# Patient Record
Sex: Female | Born: 1970 | Race: Black or African American | Hispanic: No | Marital: Single | Smoking: Never smoker
Health system: Southern US, Community
[De-identification: ages and names within clinical notes are randomized; demographics above are authoritative.]

## PROBLEM LIST (undated history)

## (undated) ENCOUNTER — Ambulatory Visit (HOSPITAL_COMMUNITY): Admission: EM | Payer: Self-pay | Source: Home / Self Care

## (undated) DIAGNOSIS — I38 Endocarditis, valve unspecified: Secondary | ICD-10-CM

## (undated) DIAGNOSIS — R51 Headache: Secondary | ICD-10-CM

## (undated) DIAGNOSIS — K279 Peptic ulcer, site unspecified, unspecified as acute or chronic, without hemorrhage or perforation: Secondary | ICD-10-CM

## (undated) DIAGNOSIS — R112 Nausea with vomiting, unspecified: Secondary | ICD-10-CM

## (undated) DIAGNOSIS — E1165 Type 2 diabetes mellitus with hyperglycemia: Secondary | ICD-10-CM

## (undated) DIAGNOSIS — Z8719 Personal history of other diseases of the digestive system: Secondary | ICD-10-CM

## (undated) DIAGNOSIS — G5603 Carpal tunnel syndrome, bilateral upper limbs: Secondary | ICD-10-CM

## (undated) DIAGNOSIS — G43909 Migraine, unspecified, not intractable, without status migrainosus: Secondary | ICD-10-CM

## (undated) DIAGNOSIS — I10 Essential (primary) hypertension: Secondary | ICD-10-CM

## (undated) DIAGNOSIS — G43019 Migraine without aura, intractable, without status migrainosus: Secondary | ICD-10-CM

## (undated) DIAGNOSIS — N289 Disorder of kidney and ureter, unspecified: Secondary | ICD-10-CM

## (undated) DIAGNOSIS — K219 Gastro-esophageal reflux disease without esophagitis: Secondary | ICD-10-CM

## (undated) DIAGNOSIS — B9681 Helicobacter pylori [H. pylori] as the cause of diseases classified elsewhere: Secondary | ICD-10-CM

## (undated) DIAGNOSIS — J45909 Unspecified asthma, uncomplicated: Secondary | ICD-10-CM

## (undated) DIAGNOSIS — R519 Headache, unspecified: Secondary | ICD-10-CM

## (undated) DIAGNOSIS — Z9889 Other specified postprocedural states: Secondary | ICD-10-CM

## (undated) HISTORY — PX: DILATION AND CURETTAGE OF UTERUS: SHX78

## (undated) HISTORY — DX: Migraine without aura, intractable, without status migrainosus: G43.019

## (undated) HISTORY — PX: TUBAL LIGATION: SHX77

---
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
## 1997-10-31 ENCOUNTER — Emergency Department (HOSPITAL_COMMUNITY): Admission: EM | Admit: 1997-10-31 | Discharge: 1997-10-31 | Payer: Self-pay | Admitting: Emergency Medicine

## 1998-06-28 ENCOUNTER — Emergency Department (HOSPITAL_COMMUNITY): Admission: EM | Admit: 1998-06-28 | Discharge: 1998-06-28 | Payer: Self-pay | Admitting: Emergency Medicine

## 1998-06-29 ENCOUNTER — Emergency Department (HOSPITAL_COMMUNITY): Admission: EM | Admit: 1998-06-29 | Discharge: 1998-06-29 | Payer: Self-pay | Admitting: Emergency Medicine

## 1999-01-13 ENCOUNTER — Ambulatory Visit (HOSPITAL_COMMUNITY): Admission: RE | Admit: 1999-01-13 | Discharge: 1999-01-13 | Payer: Self-pay | Admitting: Internal Medicine

## 1999-01-13 ENCOUNTER — Encounter: Payer: Self-pay | Admitting: Internal Medicine

## 1999-09-28 ENCOUNTER — Encounter (INDEPENDENT_AMBULATORY_CARE_PROVIDER_SITE_OTHER): Payer: Self-pay

## 1999-09-28 ENCOUNTER — Ambulatory Visit (HOSPITAL_COMMUNITY): Admission: RE | Admit: 1999-09-28 | Discharge: 1999-09-28 | Payer: Self-pay | Admitting: Obstetrics

## 2000-06-22 ENCOUNTER — Inpatient Hospital Stay (HOSPITAL_COMMUNITY): Admission: AD | Admit: 2000-06-22 | Discharge: 2000-06-22 | Payer: Self-pay | Admitting: Obstetrics & Gynecology

## 2000-11-18 ENCOUNTER — Inpatient Hospital Stay (HOSPITAL_COMMUNITY): Admission: AD | Admit: 2000-11-18 | Discharge: 2000-11-18 | Payer: Self-pay | Admitting: Obstetrics

## 2001-01-27 ENCOUNTER — Emergency Department (HOSPITAL_COMMUNITY): Admission: EM | Admit: 2001-01-27 | Discharge: 2001-01-27 | Payer: Self-pay | Admitting: Emergency Medicine

## 2001-05-26 ENCOUNTER — Inpatient Hospital Stay (HOSPITAL_COMMUNITY): Admission: AD | Admit: 2001-05-26 | Discharge: 2001-05-26 | Payer: Self-pay | Admitting: *Deleted

## 2001-09-21 ENCOUNTER — Inpatient Hospital Stay (HOSPITAL_COMMUNITY): Admission: AD | Admit: 2001-09-21 | Discharge: 2001-09-21 | Payer: Self-pay | Admitting: *Deleted

## 2001-10-30 ENCOUNTER — Inpatient Hospital Stay (HOSPITAL_COMMUNITY): Admission: AD | Admit: 2001-10-30 | Discharge: 2001-10-30 | Payer: Self-pay | Admitting: *Deleted

## 2002-02-04 ENCOUNTER — Inpatient Hospital Stay (HOSPITAL_COMMUNITY): Admission: AD | Admit: 2002-02-04 | Discharge: 2002-02-04 | Payer: Self-pay | Admitting: *Deleted

## 2002-03-24 ENCOUNTER — Encounter: Admission: RE | Admit: 2002-03-24 | Discharge: 2002-03-24 | Payer: Self-pay | Admitting: Obstetrics and Gynecology

## 2002-03-24 ENCOUNTER — Encounter: Payer: Self-pay | Admitting: Obstetrics and Gynecology

## 2002-04-15 ENCOUNTER — Inpatient Hospital Stay (HOSPITAL_COMMUNITY): Admission: AD | Admit: 2002-04-15 | Discharge: 2002-04-15 | Payer: Self-pay | Admitting: *Deleted

## 2002-09-12 ENCOUNTER — Inpatient Hospital Stay (HOSPITAL_COMMUNITY): Admission: AD | Admit: 2002-09-12 | Discharge: 2002-09-12 | Payer: Self-pay | Admitting: Obstetrics and Gynecology

## 2002-12-25 ENCOUNTER — Emergency Department (HOSPITAL_COMMUNITY): Admission: EM | Admit: 2002-12-25 | Discharge: 2002-12-26 | Payer: Self-pay | Admitting: Emergency Medicine

## 2003-08-26 ENCOUNTER — Inpatient Hospital Stay (HOSPITAL_COMMUNITY): Admission: AD | Admit: 2003-08-26 | Discharge: 2003-08-26 | Payer: Self-pay | Admitting: Obstetrics and Gynecology

## 2004-01-23 ENCOUNTER — Inpatient Hospital Stay (HOSPITAL_COMMUNITY): Admission: AD | Admit: 2004-01-23 | Discharge: 2004-01-23 | Payer: Self-pay | Admitting: Obstetrics and Gynecology

## 2004-05-28 ENCOUNTER — Emergency Department (HOSPITAL_COMMUNITY): Admission: EM | Admit: 2004-05-28 | Discharge: 2004-05-29 | Payer: Self-pay | Admitting: Emergency Medicine

## 2004-07-28 ENCOUNTER — Inpatient Hospital Stay (HOSPITAL_COMMUNITY): Admission: AD | Admit: 2004-07-28 | Discharge: 2004-07-28 | Payer: Self-pay | Admitting: Obstetrics & Gynecology

## 2004-10-21 ENCOUNTER — Ambulatory Visit (HOSPITAL_COMMUNITY): Admission: RE | Admit: 2004-10-21 | Discharge: 2004-10-21 | Payer: Self-pay | Admitting: Allergy and Immunology

## 2005-01-12 ENCOUNTER — Inpatient Hospital Stay (HOSPITAL_COMMUNITY): Admission: AD | Admit: 2005-01-12 | Discharge: 2005-01-12 | Payer: Self-pay | Admitting: Obstetrics and Gynecology

## 2005-01-29 ENCOUNTER — Inpatient Hospital Stay (HOSPITAL_COMMUNITY): Admission: AD | Admit: 2005-01-29 | Discharge: 2005-01-29 | Payer: Self-pay | Admitting: *Deleted

## 2005-05-27 ENCOUNTER — Inpatient Hospital Stay (HOSPITAL_COMMUNITY): Admission: AD | Admit: 2005-05-27 | Discharge: 2005-05-27 | Payer: Self-pay | Admitting: Obstetrics and Gynecology

## 2006-02-24 ENCOUNTER — Inpatient Hospital Stay (HOSPITAL_COMMUNITY): Admission: AD | Admit: 2006-02-24 | Discharge: 2006-02-25 | Payer: Self-pay | Admitting: Obstetrics and Gynecology

## 2006-03-19 ENCOUNTER — Inpatient Hospital Stay (HOSPITAL_COMMUNITY): Admission: AD | Admit: 2006-03-19 | Discharge: 2006-03-19 | Payer: Self-pay | Admitting: Gynecology

## 2006-12-14 ENCOUNTER — Emergency Department (HOSPITAL_COMMUNITY): Admission: EM | Admit: 2006-12-14 | Discharge: 2006-12-14 | Payer: Self-pay | Admitting: Emergency Medicine

## 2007-01-17 ENCOUNTER — Emergency Department (HOSPITAL_COMMUNITY): Admission: EM | Admit: 2007-01-17 | Discharge: 2007-01-17 | Payer: Self-pay | Admitting: Emergency Medicine

## 2007-08-02 ENCOUNTER — Emergency Department (HOSPITAL_COMMUNITY): Admission: EM | Admit: 2007-08-02 | Discharge: 2007-08-02 | Payer: Self-pay | Admitting: Family Medicine

## 2007-08-27 ENCOUNTER — Emergency Department (HOSPITAL_COMMUNITY): Admission: EM | Admit: 2007-08-27 | Discharge: 2007-08-27 | Payer: Self-pay | Admitting: Emergency Medicine

## 2009-06-17 ENCOUNTER — Ambulatory Visit: Payer: Self-pay | Admitting: Physician Assistant

## 2009-06-17 ENCOUNTER — Inpatient Hospital Stay (HOSPITAL_COMMUNITY): Admission: AD | Admit: 2009-06-17 | Discharge: 2009-06-17 | Payer: Self-pay | Admitting: Obstetrics & Gynecology

## 2010-02-01 ENCOUNTER — Inpatient Hospital Stay (HOSPITAL_COMMUNITY): Admission: AD | Admit: 2010-02-01 | Discharge: 2010-02-01 | Payer: Self-pay | Admitting: Obstetrics and Gynecology

## 2010-02-01 ENCOUNTER — Ambulatory Visit: Payer: Self-pay | Admitting: Nurse Practitioner

## 2010-05-31 ENCOUNTER — Inpatient Hospital Stay (HOSPITAL_COMMUNITY): Admission: AD | Admit: 2010-05-31 | Discharge: 2010-05-31 | Payer: Self-pay | Admitting: Family Medicine

## 2010-05-31 ENCOUNTER — Ambulatory Visit: Payer: Self-pay | Admitting: Physician Assistant

## 2010-07-07 ENCOUNTER — Ambulatory Visit: Payer: Self-pay | Admitting: Obstetrics and Gynecology

## 2010-08-21 ENCOUNTER — Encounter: Payer: Self-pay | Admitting: Gastroenterology

## 2010-10-12 LAB — WET PREP, GENITAL
Clue Cells Wet Prep HPF POC: NONE SEEN
Trich, Wet Prep: NONE SEEN
Yeast Wet Prep HPF POC: NONE SEEN

## 2010-10-12 LAB — CBC
HCT: 37 % (ref 36.0–46.0)
Hemoglobin: 12.7 g/dL (ref 12.0–15.0)
MCH: 32.2 pg (ref 26.0–34.0)
MCHC: 34.3 g/dL (ref 30.0–36.0)
MCV: 93.9 fL (ref 78.0–100.0)
Platelets: 267 10*3/uL (ref 150–400)
RBC: 3.93 MIL/uL (ref 3.87–5.11)
RDW: 13.3 % (ref 11.5–15.5)
WBC: 6.3 10*3/uL (ref 4.0–10.5)

## 2010-10-12 LAB — DIFFERENTIAL
Basophils Absolute: 0.1 10*3/uL (ref 0.0–0.1)
Basophils Relative: 1 % (ref 0–1)
Eosinophils Absolute: 0.1 10*3/uL (ref 0.0–0.7)
Eosinophils Relative: 2 % (ref 0–5)
Lymphocytes Relative: 34 % (ref 12–46)
Lymphs Abs: 2.1 10*3/uL (ref 0.7–4.0)
Monocytes Absolute: 0.5 10*3/uL (ref 0.1–1.0)
Monocytes Relative: 8 % (ref 3–12)
Neutro Abs: 3.4 10*3/uL (ref 1.7–7.7)
Neutrophils Relative %: 55 % (ref 43–77)

## 2010-10-12 LAB — URINALYSIS, ROUTINE W REFLEX MICROSCOPIC
Bilirubin Urine: NEGATIVE
Glucose, UA: NEGATIVE mg/dL
Ketones, ur: NEGATIVE mg/dL
Leukocytes, UA: NEGATIVE
Nitrite: NEGATIVE
Protein, ur: NEGATIVE mg/dL
Specific Gravity, Urine: 1.025 (ref 1.005–1.030)
Urobilinogen, UA: 0.2 mg/dL (ref 0.0–1.0)
pH: 6 (ref 5.0–8.0)

## 2010-10-12 LAB — GC/CHLAMYDIA PROBE AMP, GENITAL
Chlamydia, DNA Probe: NEGATIVE
GC Probe Amp, Genital: NEGATIVE

## 2010-10-12 LAB — POCT PREGNANCY, URINE: Preg Test, Ur: NEGATIVE

## 2010-10-12 LAB — URINE MICROSCOPIC-ADD ON

## 2010-10-16 LAB — URINALYSIS, ROUTINE W REFLEX MICROSCOPIC
Bilirubin Urine: NEGATIVE
Glucose, UA: NEGATIVE mg/dL
Ketones, ur: NEGATIVE mg/dL
Leukocytes, UA: NEGATIVE
Nitrite: NEGATIVE
Protein, ur: NEGATIVE mg/dL
Specific Gravity, Urine: 1.025 (ref 1.005–1.030)
Urobilinogen, UA: 0.2 mg/dL (ref 0.0–1.0)
pH: 7 (ref 5.0–8.0)

## 2010-10-16 LAB — POCT PREGNANCY, URINE: Preg Test, Ur: NEGATIVE

## 2010-10-16 LAB — URINE MICROSCOPIC-ADD ON

## 2010-10-16 LAB — GC/CHLAMYDIA PROBE AMP, GENITAL
Chlamydia, DNA Probe: POSITIVE — AB
GC Probe Amp, Genital: NEGATIVE

## 2010-10-16 LAB — WET PREP, GENITAL
Trich, Wet Prep: NONE SEEN
Yeast Wet Prep HPF POC: NONE SEEN

## 2010-11-02 LAB — URINALYSIS, ROUTINE W REFLEX MICROSCOPIC
Bilirubin Urine: NEGATIVE
Glucose, UA: NEGATIVE mg/dL
Ketones, ur: NEGATIVE mg/dL
Leukocytes, UA: NEGATIVE
Nitrite: NEGATIVE
Protein, ur: NEGATIVE mg/dL
Specific Gravity, Urine: 1.025 (ref 1.005–1.030)
Urobilinogen, UA: 0.2 mg/dL (ref 0.0–1.0)
pH: 6.5 (ref 5.0–8.0)

## 2010-11-02 LAB — URINE MICROSCOPIC-ADD ON

## 2010-11-02 LAB — WET PREP, GENITAL
Trich, Wet Prep: NONE SEEN
Yeast Wet Prep HPF POC: NONE SEEN

## 2010-11-02 LAB — POCT PREGNANCY, URINE: Preg Test, Ur: NEGATIVE

## 2010-11-02 LAB — GC/CHLAMYDIA PROBE AMP, GENITAL
Chlamydia, DNA Probe: NEGATIVE
GC Probe Amp, Genital: NEGATIVE

## 2010-12-16 NOTE — Op Note (Signed)
[facility]  Patient:    Sandra Moody, Sandra Moody                    MRN: [id number] Proc. Date: 09/28/99 Adm. Date:  [id number] Attending:  Venita Sheffield                           Operative Report  PREOPERATIVE DIAGNOSIS:       Dysfunctional uterine bleeding and multiparity.  POSTOPERATIVE DIAGNOSIS:  OPERATION:                    Diagnostic D&C and open laparoscopic tubal sterilization.  SURGEON:                      Kathreen Cosier, M.D.  ASSISTANT:  ANESTHESIA:                   General anesthesia.  ESTIMATED BLOOD LOSS:  DESCRIPTION OF PROCEDURE:     Under general anesthesia with the patient in the lithotomy position, the abdomen, perineum, and vagina were prepped and draped. The bladder was emptied with a straight catheter.  The uterus was normal size with negative adnexa.  A weighted speculum was placed in the vagina.  Anterior lip of the cervix grasped with a tenaculum.  The endocervix curetted and a small amount of tissue obtained.  Endometrial cavity sounded to 10 cm.  The cervix dilated with a #10 Pratt and the endometrial cavity curetted.  A small amount of tissue obtained. A Hulka tenaculum placed in the endocervix.  In the umbilicus, a transverse incision was made and carried down to the fascia.  The fascia was cleaned and grasped with two Kochers.  The fascia and peritoneum opened with the Mayo scissors. The sleeve of the trocar inserted intraperitoneally.  3 liters of carbon dioxide infused intraperitoneally.  Visualizing scope inserted.  The uterus, tubes, and  ovaries were normal.  The cautery probe was inserted through the sleeve of the scope.  The tube was traced to the fimbria on the right and also on the left. n the right side, starting one inch from the cornua the tube was cauterized.  It as cauterized in a total of five places moving lateral from the first site of cautery. Approximately two inches of  tube cauterized.  The procedure done in a similar fashion on the other side.  The probe was removed.  CO2 allowed to escape from he peritoneal cavity.  The fascia was closed with one stitch of 0 Dexon.  The skin  closed with subcuticular stitch of 3-0 plain.  The patient tolerated the procedure well and was taken to the recovery room in good condition. DD:  09/28/99 TD:  09/28/99 Job: [id number]

## 2010-12-25 ENCOUNTER — Inpatient Hospital Stay (HOSPITAL_COMMUNITY)
Admission: AD | Admit: 2010-12-25 | Discharge: 2010-12-25 | Disposition: A | Discharge status: Home / Self Care | Payer: Self-pay | Source: Ambulatory Visit | Attending: Obstetrics & Gynecology | Admitting: Obstetrics & Gynecology

## 2010-12-25 DIAGNOSIS — N390 Urinary tract infection, site not specified: Secondary | ICD-10-CM

## 2010-12-25 DIAGNOSIS — R300 Dysuria: Secondary | ICD-10-CM

## 2010-12-25 LAB — URINALYSIS, ROUTINE W REFLEX MICROSCOPIC
Bilirubin Urine: NEGATIVE
Glucose, UA: NEGATIVE mg/dL
Ketones, ur: NEGATIVE mg/dL
Nitrite: POSITIVE — AB
Protein, ur: 100 mg/dL — AB
Specific Gravity, Urine: >1.03 — ABNORMAL HIGH (ref 1.005–1.030)
Urobilinogen, UA: 0.2 mg/dL (ref 0.0–1.0)
pH: 6.5 (ref 5.0–8.0)

## 2010-12-25 LAB — URINE MICROSCOPIC-ADD ON

## 2010-12-25 LAB — POCT PREGNANCY, URINE: Preg Test, Ur: NEGATIVE

## 2010-12-27 LAB — URINE CULTURE
Colony Count: >=100000
Culture  Setup Time: 201205272012

## 2011-04-20 LAB — POCT RAPID STREP A: Streptococcus, Group A Screen (Direct): NEGATIVE

## 2012-05-29 ENCOUNTER — Inpatient Hospital Stay (HOSPITAL_COMMUNITY)
Admission: AD | Admit: 2012-05-29 | Discharge: 2012-05-30 | Disposition: A | Discharge status: Home / Self Care | Payer: Self-pay | Source: Ambulatory Visit | Attending: Obstetrics and Gynecology | Admitting: Obstetrics and Gynecology

## 2012-05-29 ENCOUNTER — Encounter (HOSPITAL_COMMUNITY): Payer: Self-pay | Admitting: *Deleted

## 2012-05-29 DIAGNOSIS — R1032 Left lower quadrant pain: Secondary | ICD-10-CM | POA: Insufficient documentation

## 2012-05-29 DIAGNOSIS — N83209 Unspecified ovarian cyst, unspecified side: Secondary | ICD-10-CM | POA: Insufficient documentation

## 2012-05-29 HISTORY — DX: Unspecified asthma, uncomplicated: J45.909

## 2012-05-29 LAB — POCT PREGNANCY, URINE: Preg Test, Ur: NEGATIVE

## 2012-05-29 NOTE — MAU Note (Signed)
Pt states she started feeling pain bad about an hour ago. Pt states she has been feeling cramping pain for about 1 week. Pt started having pain in her back.

## 2012-05-29 NOTE — MAU Note (Signed)
Pt reports pain on left side , feels like a stabbing pain x 1 week, today lower back started hurting. Denies dysuria. LMP 04/25/2012

## 2012-05-30 ENCOUNTER — Inpatient Hospital Stay (HOSPITAL_COMMUNITY): Payer: Self-pay

## 2012-05-30 DIAGNOSIS — N83209 Unspecified ovarian cyst, unspecified side: Secondary | ICD-10-CM

## 2012-05-30 LAB — URINALYSIS, ROUTINE W REFLEX MICROSCOPIC
Bilirubin Urine: NEGATIVE
Glucose, UA: NEGATIVE mg/dL
Ketones, ur: NEGATIVE mg/dL
Leukocytes, UA: NEGATIVE
Nitrite: NEGATIVE
Protein, ur: NEGATIVE mg/dL
Specific Gravity, Urine: 1.025 (ref 1.005–1.030)
Urobilinogen, UA: 0.2 mg/dL (ref 0.0–1.0)
pH: 6 (ref 5.0–8.0)

## 2012-05-30 LAB — URINE MICROSCOPIC-ADD ON

## 2012-05-30 LAB — WET PREP, GENITAL
Trich, Wet Prep: NONE SEEN
Yeast Wet Prep HPF POC: NONE SEEN

## 2012-05-30 MED ORDER — KETOROLAC TROMETHAMINE 60 MG/2ML IM SOLN
60.0000 mg | Freq: Once | INTRAMUSCULAR | Status: AC
  Administered 2012-05-30: 60 mg via INTRAMUSCULAR
  Filled 2012-05-30: qty 2

## 2012-05-30 NOTE — MAU Provider Note (Signed)
History     CSN: [id number]  Arrival date and time: 05/29/12 2323   First Provider Initiated Contact with Patient 05/30/12 0015      Chief Complaint  Patient presents with  . Abdominal Pain   HPI  Sandra Moody is a 41 y.o. [id number] who presents today with LLQ pain and lower back pain. She states that the LLQ started about a week ago, and then the back pain started just before she came here. She rates the pain 6/10. She took 2 tylenol before coming here and states that the pain has improved, but only a very small amount.   Past Medical History  Diagnosis Date  . Asthma     Past Surgical History  Procedure Date  . Tubal ligation     Family History  Problem Relation Age of Onset  . Thyroid disease Mother   . Heart disease Mother     History  Substance Use Topics  . Smoking status: Never Smoker   . Smokeless tobacco: Never Used  . Alcohol Use: Yes    Allergies: Allergies no known allergies  Prescriptions prior to admission  Medication Sig Dispense Refill  . Multiple Vitamin (MULTIVITAMIN) capsule Take 1 capsule by mouth daily.        Review of Systems  Constitutional: Negative for fever and chills.  Gastrointestinal: Positive for abdominal pain (see HPI). Negative for nausea, vomiting, diarrhea and constipation.  Genitourinary: Negative for dysuria, urgency and frequency.  Musculoskeletal: Positive for back pain (see HPI). Negative for myalgias.   Physical Exam   Blood pressure 143/91, pulse 80, temperature 98.1 F (36.7 C), temperature source Oral, resp. rate 18, height 5' 3.5" (1.613 m), weight 64.864 kg (143 lb), last menstrual period 04/25/2012, SpO2 100.00%.  Physical Exam  Nursing note and vitals reviewed. Constitutional: She is oriented to person, place, and time. She appears well-developed and well-nourished.  Cardiovascular: Normal rate and regular rhythm.   Respiratory: Effort normal.  GI: Soft. She exhibits no distension. There is no  tenderness. There is no rebound and no guarding.  Genitourinary:       No CVA tenderness  External: normal Vagina: small amount of patchy white discharge Cervix: pink, smooth, no CMT Uterus: AV, NSSC Adnexa: Right: NT Left: tender, slightly enlarged  Neurological: She is alert and oriented to person, place, and time.  Skin: Skin is warm and dry.    MAU Course  Procedures  Limited pelvic ultrasound ordered   *RADIOLOGY REPORT*  Clinical Data: Left pelvic pain for 1 week, worsening.  TRANSABDOMINAL AND TRANSVAGINAL ULTRASOUND OF PELVIS  Technique: Both transabdominal and transvaginal ultrasound  examinations of the pelvis were performed. Transabdominal technique  was performed for global imaging of the pelvis including uterus,  ovaries, adnexal regions, and pelvic cul-de-sac.  It was necessary to proceed with endovaginal exam following the  transabdominal exam to visualize the right ovary and adnexa.  Comparison: Pelvic ultrasound 05/31/2010.  Findings:  Uterus: Measures 10.1 x 4.7 x 7.4 cm. A single 1.8 cm in diameter  fibroid is identified.  Endometrium: Normal in thickness and appearance  Right ovary: Normal appearance/no adnexal mass  Left ovary: Measures 5.8 x 3.8 x 3.9 cm. A cyst is identified  measuring 5.0 by 3.2 by 3.2 cm. Thin septations are identified  within the cyst and there some lacy internal echoes. No focal  nodule is identified. There is no flow within the septations on  Doppler imaging.  Other findings: No free fluid  IMPRESSION:  Septated left ovarian cysts as described above. Repeat ultrasound  in 4-6 weeks is recommended to exclude cystic neoplasm.  Original Report Authenticated By: Bernadene Bell. Maricela Curet, M.D.   Assessment and Plan   1. Other and unspecified ovarian cyst   FU in GYN clinic in 4 weeks.   Tawnya Crook 05/30/2012, 12:19 AM

## 2012-05-30 NOTE — Progress Notes (Signed)
Some blood noted during collection of specimens.

## 2012-05-31 LAB — GC/CHLAMYDIA PROBE AMP, GENITAL
Chlamydia, DNA Probe: NEGATIVE
GC Probe Amp, Genital: NEGATIVE

## 2012-06-03 NOTE — MAU Provider Note (Signed)
Attestation of Attending Supervision of Advanced Practitioner: Evaluation and management procedures were performed by the PA/NP/CNM/OB Fellow under my supervision/collaboration. Chart reviewed and agree with management and plan.  Amee Boothe V 06/03/2012 12:17 AM    

## 2012-06-17 ENCOUNTER — Encounter: Payer: Self-pay | Admitting: Obstetrics & Gynecology

## 2012-06-17 ENCOUNTER — Ambulatory Visit (INDEPENDENT_AMBULATORY_CARE_PROVIDER_SITE_OTHER): Payer: Self-pay | Admitting: Obstetrics & Gynecology

## 2012-06-17 VITALS — BP 134/88 | HR 93 | Temp 97.5°F | Ht 63.5 in | Wt 141.7 lb

## 2012-06-17 DIAGNOSIS — R102 Pelvic and perineal pain unspecified side: Secondary | ICD-10-CM | POA: Insufficient documentation

## 2012-06-17 DIAGNOSIS — B9689 Other specified bacterial agents as the cause of diseases classified elsewhere: Secondary | ICD-10-CM | POA: Insufficient documentation

## 2012-06-17 DIAGNOSIS — N949 Unspecified condition associated with female genital organs and menstrual cycle: Secondary | ICD-10-CM

## 2012-06-17 DIAGNOSIS — N83209 Unspecified ovarian cyst, unspecified side: Secondary | ICD-10-CM

## 2012-06-17 DIAGNOSIS — A499 Bacterial infection, unspecified: Secondary | ICD-10-CM

## 2012-06-17 DIAGNOSIS — N760 Acute vaginitis: Secondary | ICD-10-CM

## 2012-06-17 MED ORDER — METRONIDAZOLE 0.75 % VA GEL: 1.0000 | Freq: Every day | VAGINAL | Status: DC

## 2012-06-17 NOTE — Progress Notes (Signed)
Patient ID: Sandra Moody, female   DOB: 14-Mar-1971, 41 y.o.   MRN: [id number]  Chief Complaint  Patient presents with  . Ovarian Cyst    HPI FLEDA PAGEL is a 41 y.o. female.  [id number] Patient's last menstrual period was 05/25/2012. Seen in MAU on 10/31 for LLQ pain and low back pain. US showed left ovarian cyst. She still has pain, which is relieved by Tylenol or Ibuprofen  HPI  Past Medical History  Diagnosis Date  . Asthma     Past Surgical History  Procedure Date  . Tubal ligation     Family History  Problem Relation Age of Onset  . Thyroid disease Mother   . Heart disease Mother     Social History History  Substance Use Topics  . Smoking status: Never Smoker   . Smokeless tobacco: Never Used  . Alcohol Use: Yes    Allergies  Allergen Reactions  . Ginger Itching and Swelling  . Passion Fruit Flavor (Flavoring Agent) Itching and Swelling  . Peanuts (Peanut Oil) Anaphylaxis    PT ALLERGIC TO ALL TREE NUTS    Current Outpatient Prescriptions  Medication Sig Dispense Refill  . Multiple Vitamin (MULTIVITAMIN) capsule Take 1 capsule by mouth daily.        Review of Systems Review of Systems  Constitutional: Negative for fever.  Genitourinary: Positive for vaginal discharge (symptoms of BV. Wet prerp showed clue cells) and pelvic pain (llq). Negative for dysuria.  Musculoskeletal: Positive for back pain.    Blood pressure 134/88, pulse 93, temperature 97.5 F (36.4 C), temperature source Oral, height 5' 3.5" (1.613 m), weight 141 lb 11.2 oz (64.275 kg), last menstrual period 05/25/2012.  Physical Exam Physical Exam  Vitals reviewed. Constitutional: She appears well-nourished. No distress.  Pulmonary/Chest: Effort normal. No respiratory distress.  Abdominal: Soft. She exhibits no mass. There is no tenderness.  Genitourinary: Vagina normal and uterus normal.       Mild left tenderness, no mass  Skin: Skin is warm and dry.  Psychiatric: She has a normal  mood and affect. Her behavior is normal.    Data Reviewed  *RADIOLOGY REPORT*  Clinical Data: Left pelvic pain for 1 week, worsening.  TRANSABDOMINAL AND TRANSVAGINAL ULTRASOUND OF PELVIS  Technique: Both transabdominal and transvaginal ultrasound  examinations of the pelvis were performed. Transabdominal technique  was performed for global imaging of the pelvis including uterus,  ovaries, adnexal regions, and pelvic cul-de-sac.  It was necessary to proceed with endovaginal exam following the  transabdominal exam to visualize the right ovary and adnexa.  Comparison: Pelvic ultrasound 05/31/2010.  Findings:  Uterus: Measures 10.1 x 4.7 x 7.4 cm. A single 1.8 cm in diameter  fibroid is identified.  Endometrium: Normal in thickness and appearance  Right ovary: Normal appearance/no adnexal mass  Left ovary: Measures 5.8 x 3.8 x 3.9 cm. A cyst is identified  measuring 5.0 by 3.2 by 3.2 cm. Thin septations are identified  within the cyst and there some lacy internal echoes. No focal  nodule is identified. There is no flow within the septations on  Doppler imaging.  Other findings: No free fluid  IMPRESSION:  Septated left ovarian cysts as described above. Repeat ultrasound  in 4-6 weeks is recommended to exclude cystic neoplasm.  Original Report Authenticated By: Bernadene Bell. Maricela Curet, M.D.         Assessment    Symptomatic left ovarian cyst. Bacterial vaginosis    Plan    Repeat US in  4 weeks then return to clinic.Metrogel vaginal       Shynia Daleo 06/17/2012, 3:28 PM

## 2012-07-12 ENCOUNTER — Ambulatory Visit (HOSPITAL_COMMUNITY): Payer: Self-pay

## 2012-07-15 ENCOUNTER — Ambulatory Visit: Payer: Self-pay | Admitting: Obstetrics and Gynecology

## 2012-07-16 ENCOUNTER — Ambulatory Visit (HOSPITAL_COMMUNITY): Payer: Self-pay | Attending: Obstetrics & Gynecology

## 2013-05-19 ENCOUNTER — Emergency Department (HOSPITAL_COMMUNITY)
Admission: EM | Admit: 2013-05-19 | Discharge: 2013-05-19 | Disposition: A | Discharge status: Home / Self Care | Payer: Self-pay | Attending: Emergency Medicine | Admitting: Emergency Medicine

## 2013-05-19 ENCOUNTER — Encounter (HOSPITAL_COMMUNITY): Payer: Self-pay | Admitting: Emergency Medicine

## 2013-05-19 ENCOUNTER — Emergency Department (HOSPITAL_COMMUNITY): Payer: Self-pay

## 2013-05-19 DIAGNOSIS — R079 Chest pain, unspecified: Secondary | ICD-10-CM

## 2013-05-19 DIAGNOSIS — R0789 Other chest pain: Secondary | ICD-10-CM | POA: Insufficient documentation

## 2013-05-19 DIAGNOSIS — Z79899 Other long term (current) drug therapy: Secondary | ICD-10-CM | POA: Insufficient documentation

## 2013-05-19 DIAGNOSIS — J45909 Unspecified asthma, uncomplicated: Secondary | ICD-10-CM | POA: Insufficient documentation

## 2013-05-19 LAB — POCT I-STAT TROPONIN I
Troponin i, poc: 0 ng/mL (ref 0.00–0.08)
Troponin i, poc: 0.01 ng/mL (ref 0.00–0.08)

## 2013-05-19 LAB — BASIC METABOLIC PANEL
BUN: 5 mg/dL — ABNORMAL LOW (ref 6–23)
CO2: 30 mEq/L (ref 19–32)
Calcium: 9.3 mg/dL (ref 8.4–10.5)
Chloride: 102 mEq/L (ref 96–112)
Creatinine, Ser: 0.87 mg/dL (ref 0.50–1.10)
GFR calc Af Amer: >90 mL/min (ref >90)
GFR calc non Af Amer: 82 mL/min — ABNORMAL LOW (ref >90)
Glucose, Bld: 87 mg/dL (ref 70–99)
Potassium: 3.6 mEq/L (ref 3.5–5.1)
Sodium: 139 mEq/L (ref 135–145)

## 2013-05-19 LAB — CBC
HCT: 38.5 % (ref 36.0–46.0)
Hemoglobin: 12.6 g/dL (ref 12.0–15.0)
MCH: 31.3 pg (ref 26.0–34.0)
MCHC: 32.7 g/dL (ref 30.0–36.0)
MCV: 95.8 fL (ref 78.0–100.0)
Platelets: 336 10*3/uL (ref 150–400)
RBC: 4.02 MIL/uL (ref 3.87–5.11)
RDW: 13.1 % (ref 11.5–15.5)
WBC: 7.7 10*3/uL (ref 4.0–10.5)

## 2013-05-19 LAB — PRO B NATRIURETIC PEPTIDE: Pro B Natriuretic peptide (BNP): 19.8 pg/mL (ref 0–125)

## 2013-05-19 MED ORDER — OXYCODONE-ACETAMINOPHEN 5-325 MG PO TABS: 2.0000 | ORAL_TABLET | ORAL | Status: DC | PRN

## 2013-05-19 NOTE — ED Provider Notes (Addendum)
CSN: [id number]     Arrival date & time 05/19/13  1957 History   First MD Initiated Contact with Patient 05/19/13 2043     Chief Complaint  Patient presents with  . Chest Pain   (Consider location/radiation/quality/duration/timing/severity/associated sxs/prior Treatment) HPI  42 year old female with history of asthma presents today with a four-day history of left-sided chest pain and tightness. She describes the pain as burning in nature radiating under the left breast into the left neck. It is worse with deep breathing, palpation, and certain movements. She has had some dyspnea and has used her albuterol nebulizer with some relief of the dyspnea but no change in the chest discomfort. She has not had any previous similar symptoms. Denies any history of DVT or pulmonary embolism. Chest pain is nonexertional in nature. She has had no nausea or vomiting. She has no significant cardiac risk factors and no family history of coronary artery disease.  Past Medical History  Diagnosis Date  . Asthma    Past Surgical History  Procedure Laterality Date  . Tubal ligation     Family History  Problem Relation Age of Onset  . Thyroid disease Mother   . Heart disease Mother    History  Substance Use Topics  . Smoking status: Never Smoker   . Smokeless tobacco: Never Used  . Alcohol Use: Yes   OB History   Grav Para Term Preterm Abortions TAB SAB Ect Mult Living   5 3 3  0 2 1 1  0 0 3     Review of Systems  All other systems reviewed and are negative.    Allergies  Ginger; Passion fruit flavor; and Peanuts  Home Medications   Current Outpatient Rx  Name  Route  Sig  Dispense  Refill  . albuterol (PROVENTIL) (2.5 MG/3ML) 0.083% nebulizer solution   Nebulization   Take 2.5 mg by nebulization every 6 (six) hours as needed for wheezing or shortness of breath.         . Ca Carbonate-Mag Hydroxide (ROLAIDS PO)   Oral   Take 1-2 tablets by mouth 3 (three) times daily as needed  (flatulence).         . SIMETHICONE PO   Oral   Take 1-2 tablets by mouth every 6 (six) hours as needed (flatulence).          BP 157/98  Pulse 67  Temp(Src) 98.2 F (36.8 C) (Oral)  Resp 19  SpO2 100%  LMP 05/13/2013 Physical Exam  Nursing note and vitals reviewed. Constitutional: She is oriented to person, place, and time. She appears well-developed and well-nourished.  HENT:  Head: Normocephalic and atraumatic.  Right Ear: External ear normal.  Left Ear: External ear normal.  Nose: Nose normal.  Mouth/Throat: Oropharynx is clear and moist.  Eyes: Conjunctivae and EOM are normal. Pupils are equal, round, and reactive to light.  Neck: Normal range of motion. Neck supple. No JVD present. No tracheal deviation present. No thyromegaly present.  Cardiovascular: Normal rate, regular rhythm, normal heart sounds and intact distal pulses.   Pulmonary/Chest: Effort normal and breath sounds normal. She exhibits tenderness.  Left-sided chest tenderness to palpation, no rash, no redness or induration.  Abdominal: Soft. Bowel sounds are normal.  Musculoskeletal: Normal range of motion. She exhibits no edema.  Lymphadenopathy:    She has no cervical adenopathy.  Neurological: She is alert and oriented to person, place, and time. She has normal reflexes.  Skin: Skin is warm and dry.  Psychiatric: She  has a normal mood and affect. Her behavior is normal. Judgment and thought content normal.    ED Course  Procedures (including critical care time) Labs Review Labs Reviewed  BASIC METABOLIC PANEL - Abnormal; Notable for the following:    BUN 5 (*)    GFR calc non Af Amer 82 (*)    All other components within normal limits  CBC  PRO B NATRIURETIC PEPTIDE  TROPONIN I  POCT I-STAT TROPONIN I   Imaging Review Dg Chest 2 View  05/19/2013   CLINICAL DATA:  Chest pain  EXAM: CHEST  2 VIEW  COMPARISON:  10/21/2004  FINDINGS: The heart size and mediastinal contours are within normal  limits. Both lungs are clear. The visualized skeletal structures are unremarkable. Mild rightward curvature of the thoracolumbar spine centered at T12 is reidentified.  IMPRESSION: No active cardiopulmonary disease.   Electronically Signed   By: Christiana Pellant M.D.   On: 05/19/2013 21:17    Date: 05/19/2013  Rate: 77  Rhythm: normal sinus rhythm  QRS Axis: normal  Intervals: normal  ST/T Wave abnormalities: normal  Conduction Disutrbances:none  Narrative Interpretation:   Old EKG Reviewed: none available   EKG Interpretation     Ventricular Rate:  77 PR Interval:  154 QRS Duration: 70 QT Interval:  358 QTC Calculation: 405 R Axis:   71 Text Interpretation:  Normal sinus rhythm Normal ECG            MDM  No diagnosis found.  42 y.o. female with asthma and atypical left-sided chest pain. She has a normal EKG. Chest pain has been present for 4 days and troponin and delta troponin are normal. I have an extremely low index of suspicion for this being coronary artery disease. She also has a negative perc test for pulmonary embolism. Patient has been given Percocet and aspirin here and has decreased pain. She's advised followup with her primary care physician.  Hilario Quarry, MD 05/19/13 1610  Hilario Quarry, MD 05/19/13 9604  Hilario Quarry, MD 05/19/13 5409

## 2013-05-19 NOTE — ED Notes (Signed)
This is a 42yo female with c/os of midsternal chest pain that radiates to lt side, deines have cardiac issus has had asthmatic attacks in the past, with noted gerd hx

## 2013-05-19 NOTE — ED Notes (Signed)
Pt. reports left chest pain with SOB and occasional productive cough and diaphoresis onset 2 days ago .

## 2013-11-06 ENCOUNTER — Emergency Department (HOSPITAL_COMMUNITY)
Admission: EM | Admit: 2013-11-06 | Discharge: 2013-11-06 | Disposition: A | Discharge status: Home / Self Care | Payer: No Typology Code available for payment source | Attending: Emergency Medicine | Admitting: Emergency Medicine

## 2013-11-06 ENCOUNTER — Encounter (HOSPITAL_COMMUNITY): Payer: Self-pay | Admitting: Emergency Medicine

## 2013-11-06 DIAGNOSIS — Y9389 Activity, other specified: Secondary | ICD-10-CM | POA: Insufficient documentation

## 2013-11-06 DIAGNOSIS — S6990XA Unspecified injury of unspecified wrist, hand and finger(s), initial encounter: Secondary | ICD-10-CM | POA: Insufficient documentation

## 2013-11-06 DIAGNOSIS — J45909 Unspecified asthma, uncomplicated: Secondary | ICD-10-CM | POA: Insufficient documentation

## 2013-11-06 DIAGNOSIS — T63301A Toxic effect of unspecified spider venom, accidental (unintentional), initial encounter: Secondary | ICD-10-CM

## 2013-11-06 DIAGNOSIS — R209 Unspecified disturbances of skin sensation: Secondary | ICD-10-CM | POA: Insufficient documentation

## 2013-11-06 DIAGNOSIS — S6980XA Other specified injuries of unspecified wrist, hand and finger(s), initial encounter: Secondary | ICD-10-CM | POA: Insufficient documentation

## 2013-11-06 DIAGNOSIS — Z79899 Other long term (current) drug therapy: Secondary | ICD-10-CM | POA: Insufficient documentation

## 2013-11-06 DIAGNOSIS — Y929 Unspecified place or not applicable: Secondary | ICD-10-CM | POA: Insufficient documentation

## 2013-11-06 MED ORDER — CEPHALEXIN 500 MG PO CAPS: 500.0000 mg | ORAL_CAPSULE | Freq: Four times a day (QID) | ORAL | Status: DC

## 2013-11-06 NOTE — Discharge Instructions (Signed)
Keep your finger elevated to help reduce swelling. Take ibuprofen for pain. Use the antibiotics prescribed to help prevent infection. Followup with a primary care provider for continued evaluation and treatment.     Spider Bite Spider bites are not common. Most spider bites do not cause serious problems. The elderly, very young children, and people with certain existing medical conditions are more likely to experience significant symptoms. SYMPTOMS  Spider bites may not cause any pain at first. Within 1 or 2 days of the bite, there may be swelling, redness, and pain in the bite area. However, some spider bites can cause pain within the first hour. TREATMENT  Your caregiver may prescribe antibiotic medicine if a bacterial infection develops in the bite. However, not all spider bites require antibiotics or prescription medicines.  HOME CARE INSTRUCTIONS  Do not scratch the bite area.  Keep the bite area clean and dry. Wash the area with soap and water as directed.  Put ice or cool compresses on the bite area.  Put ice in a plastic bag.  Place a towel between your skin and the bag.  Leave the ice on for 20 minutes, 4 times a day for the first 2 to 3 days, or as directed.  Keep the bite area elevated above the level of your heart. This helps reduce redness and swelling.  Only take over-the-counter or prescription medicines as directed by your caregiver.  If you are given antibiotics, take them as directed. Finish them even if you start to feel better. You may need a tetanus shot if:  You cannot remember when you had your last tetanus shot.  You have never had a tetanus shot.  The injury broke your skin. If you get a tetanus shot, your arm may swell, get red, and feel warm to the touch. This is common and not a problem. If you need a tetanus shot and you choose not to have one, there is a rare chance of getting tetanus. Sickness from tetanus can be serious. SEEK MEDICAL CARE  IF: Your bite is not better after 3 days of treatment. SEEK IMMEDIATE MEDICAL CARE IF:  Your bite turns purple or develops increased swelling, pain, or redness.  You develop shortness of breath or chest pain.  You have muscle cramps or painful muscle spasms.  You develop abdominal pain, nausea, or vomiting.  You feel unusually tired or sleepy. MAKE SURE YOU:  Understand these instructions.  Will watch your condition.  Will get help right away if you are not doing well or get worse. Document Released: 08/24/2004 Document Revised: 10/09/2011 Document Reviewed: 02/15/2011 [facility] Patient Information 2014 [employer], [place].

## 2013-11-06 NOTE — ED Provider Notes (Signed)
CSN: [id number]     Arrival date & time 11/06/13  1928 History  This chart was scribed for non-physician practitioner Ivonne AndrewPeter Marianna Cid, PA-C working with Raeford RazorStephen Kohut, MD by Joaquin MusicKristina Sanchez-Matthews, ED Scribe. This patient was seen in room WTR7/WTR7 and the patient's care was started at 8:19 PM .   Chief Complaint  Patient presents with  . Insect Bite   The history is provided by the patient. No language interpreter was used.   HPI Comments: Sandra Moody is a 43 y.o. female with a hx of asthma who presents to the Emergency Department complaining of insect bite to R middle finger that occurred today 4 hours ago. Pt states she did see the spider and states it appeared to be a jumping spider after she "googled" it. She states he was black with white, furry, and small. She reports having numbness to the tip of her R fingers. Pt denies having any medical conditions or allergies to medications. Pt denies nausea and emesis.   Past Medical History  Diagnosis Date  . Asthma    Past Surgical History  Procedure Laterality Date  . Tubal ligation     Family History  Problem Relation Age of Onset  . Thyroid disease Mother   . Heart disease Mother    History  Substance Use Topics  . Smoking status: Never Smoker   . Smokeless tobacco: Never Used  . Alcohol Use: Yes   OB History   Grav Para Term Preterm Abortions TAB SAB Ect Mult Living   5 3 3  0 2 1 1  0 0 3     Review of Systems  Gastrointestinal: Negative for nausea and vomiting.  Skin: Positive for wound. Negative for color change.  Neurological: Positive for numbness.    Allergies  Ginger; Passion fruit flavor; and Peanuts  Home Medications   Current Outpatient Rx  Name  Route  Sig  Dispense  Refill  . albuterol (PROVENTIL) (2.5 MG/3ML) 0.083% nebulizer solution   Nebulization   Take 2.5 mg by nebulization every 6 (six) hours as needed for wheezing or shortness of breath.         . Ca Carbonate-Mag Hydroxide (ROLAIDS PO)  Oral   Take 1-2 tablets by mouth 3 (three) times daily as needed (flatulence).         Marland Kitchen. oxyCODONE-acetaminophen (PERCOCET/ROXICET) 5-325 MG per tablet   Oral   Take 2 tablets by mouth every 4 (four) hours as needed for pain.   15 tablet   0   . SIMETHICONE PO   Oral   Take 1-2 tablets by mouth every 6 (six) hours as needed (flatulence).          BP 135/83  Pulse 78  Temp(Src) 98.5 F (36.9 C) (Oral)  Resp 16  Ht 5' 3.5" (1.613 m)  Wt 128 lb (58.06 kg)  BMI 22.32 kg/m2  SpO2 100%  Physical Exam  Nursing note and vitals reviewed. Constitutional: She is oriented to person, place, and time. She appears well-developed and well-nourished. No distress.  HENT:  Head: Normocephalic and atraumatic.  Eyes: EOM are normal.  Neck: Neck supple. No tracheal deviation present.  Cardiovascular: Normal rate.   Pulmonary/Chest: Effort normal. No respiratory distress.  Musculoskeletal: Normal range of motion.  Neurological: She is alert and oriented to person, place, and time.  Skin: Skin is warm and dry.  2 small papules, possible bite marks, to dorsal and distal R middle finger. Mild associated swelling. Normal cap refill, less than  2 seconds. Full ROM. No erythema.  Psychiatric: She has a normal mood and affect. Her behavior is normal.   ED Course  Procedures DIAGNOSTIC STUDIES: Oxygen Saturation is 100% on RA, normal by my interpretation.    COORDINATION OF CARE: 8:22 PM-Discussed treatment plan which includes discharge pt with antibiotic, advised pt to keep R hand elevated, and take an antihistamine PRN. Pt agreed to plan.    MDM   Final diagnoses:  Spider bite    I personally performed the services described in this documentation, which was scribed in my presence. The recorded information has been reviewed and is accurate.    Angus Seller, PA-C 11/06/13 2028

## 2013-11-06 NOTE — ED Notes (Signed)
Pt reports spider bite to middle finger R hand earlier today. Pt reports had itching to middle R finger earlier today for about an hr. Now denies itching or pain but states that tips of index and middle finger on R hand feel numb and swollen. Pt able to move fingers, cap refill < 3 secs.

## 2013-11-07 NOTE — ED Provider Notes (Signed)
Medical screening examination/treatment/procedure(s) were performed by non-physician practitioner and as supervising physician I was immediately available for consultation/collaboration.   EKG Interpretation None       Raeford RazorStephen Neoma Uhrich, MD 11/07/13 1743

## 2014-06-01 ENCOUNTER — Encounter (HOSPITAL_COMMUNITY): Payer: Self-pay | Admitting: Emergency Medicine

## 2014-09-11 ENCOUNTER — Other Ambulatory Visit: Payer: Self-pay

## 2014-09-11 DIAGNOSIS — Z1231 Encounter for screening mammogram for malignant neoplasm of breast: Secondary | ICD-10-CM

## 2014-09-17 ENCOUNTER — Encounter (INDEPENDENT_AMBULATORY_CARE_PROVIDER_SITE_OTHER): Payer: Self-pay

## 2014-09-17 ENCOUNTER — Ambulatory Visit
Admission: RE | Admit: 2014-09-17 | Discharge: 2014-09-17 | Disposition: A | Discharge status: Home / Self Care | Payer: BLUE CROSS/BLUE SHIELD | Source: Ambulatory Visit

## 2014-09-17 DIAGNOSIS — Z1231 Encounter for screening mammogram for malignant neoplasm of breast: Secondary | ICD-10-CM

## 2015-02-12 ENCOUNTER — Emergency Department (HOSPITAL_COMMUNITY)
Admission: EM | Admit: 2015-02-12 | Discharge: 2015-02-12 | Disposition: A | Discharge status: Home / Self Care | Payer: Self-pay

## 2015-05-24 ENCOUNTER — Other Ambulatory Visit: Payer: Self-pay | Admitting: Occupational Medicine

## 2015-05-24 ENCOUNTER — Ambulatory Visit: Payer: Self-pay

## 2015-05-24 DIAGNOSIS — M79644 Pain in right finger(s): Secondary | ICD-10-CM

## 2015-07-16 ENCOUNTER — Ambulatory Visit (INDEPENDENT_AMBULATORY_CARE_PROVIDER_SITE_OTHER): Payer: BLUE CROSS/BLUE SHIELD | Admitting: Emergency Medicine

## 2015-07-16 VITALS — BP 128/84 | HR 82 | Temp 98.2°F | Resp 16 | Ht 65.0 in | Wt 141.0 lb

## 2015-07-16 DIAGNOSIS — N3000 Acute cystitis without hematuria: Secondary | ICD-10-CM | POA: Diagnosis not present

## 2015-07-16 DIAGNOSIS — R350 Frequency of micturition: Secondary | ICD-10-CM | POA: Diagnosis not present

## 2015-07-16 LAB — POC MICROSCOPIC URINALYSIS (UMFC): Mucus: ABSENT

## 2015-07-16 LAB — POCT URINALYSIS DIP (MANUAL ENTRY)
Bilirubin, UA: NEGATIVE
Glucose, UA: NEGATIVE
Nitrite, UA: NEGATIVE
Protein Ur, POC: 30 — AB
Spec Grav, UA: 1.02
Urobilinogen, UA: 0.2
pH, UA: 5.5

## 2015-07-16 MED ORDER — PHENAZOPYRIDINE HCL 200 MG PO TABS: 200.0000 mg | ORAL_TABLET | Freq: Three times a day (TID) | ORAL | Status: DC | PRN

## 2015-07-16 MED ORDER — SULFAMETHOXAZOLE-TRIMETHOPRIM 800-160 MG PO TABS: 1.0000 | ORAL_TABLET | Freq: Two times a day (BID) | ORAL | Status: DC

## 2015-07-16 NOTE — Patient Instructions (Signed)

## 2015-07-16 NOTE — Progress Notes (Signed)
Subjective:  Patient ID: Sandra Moody, female    DOB: 04-01-1971  Age: 44 y.o. MRN: [id number]  CC: Urinary Frequency and Back Pain   HPI Sandra Moody presents  patient has a several day history of dysuria urgency and frequency. She has some low back pain. She has no history of injury or overuse. She has no fever or chills. No abdominal pain. She has no vaginal discharge or bleeding. Has no dyspareunia. She's had no improvement with hydration or over-the-counter medication.  History Sandra Moody has a past medical history of Asthma.   She has past surgical history that includes Tubal ligation.   Her  family history includes Heart disease in her mother; Thyroid disease in her mother.  She   reports that she has never smoked. She has never used smokeless tobacco. She reports that she drinks alcohol. She reports that she does not use illicit drugs.  Outpatient Prescriptions Prior to Visit  Medication Sig Dispense Refill  . albuterol (PROVENTIL) (2.5 MG/3ML) 0.083% nebulizer solution Take 2.5 mg by nebulization every 6 (six) hours as needed for wheezing or shortness of breath.    . oxyCODONE-acetaminophen (PERCOCET/ROXICET) 5-325 MG per tablet Take 2 tablets by mouth every 4 (four) hours as needed for pain. 15 tablet 0  . Ca Carbonate-Mag Hydroxide (ROLAIDS PO) Take 1-2 tablets by mouth 3 (three) times daily as needed (flatulence). Reported on 07/16/2015    . cephALEXin (KEFLEX) 500 MG capsule Take 1 capsule (500 mg total) by mouth 4 (four) times daily. X 5 days 20 capsule 0  . SIMETHICONE PO Take 1-2 tablets by mouth every 6 (six) hours as needed (flatulence).     No facility-administered medications prior to visit.    Social History   Social History  . Marital Status: Divorced    Spouse Name: N/A  . Number of Children: N/A  . Years of Education: N/A   Social History Main Topics  . Smoking status: Never Smoker   . Smokeless tobacco: Never Used  . Alcohol Use: Yes   Comment: occasional  . Drug Use: No  . Sexual Activity: Yes   Other Topics Concern  . None   Social History Narrative     Review of Systems  Constitutional: Negative for fever, chills and appetite change.  HENT: Negative for congestion, ear pain, postnasal drip, sinus pressure and sore throat.   Eyes: Negative for pain and redness.  Respiratory: Negative for cough, shortness of breath and wheezing.   Cardiovascular: Negative for leg swelling.  Gastrointestinal: Negative for nausea, vomiting, abdominal pain, diarrhea, constipation and blood in stool.  Endocrine: Negative for polyuria.  Genitourinary: Positive for dysuria, urgency and frequency. Negative for flank pain.  Musculoskeletal: Negative for gait problem.  Skin: Negative for rash.  Neurological: Negative for weakness and headaches.  Psychiatric/Behavioral: Negative for confusion and decreased concentration. The patient is not nervous/anxious.     Objective:  BP 128/84 mmHg  Pulse 82  Temp(Src) 98.2 F (36.8 C)  Resp 16  Ht 5\' 5"  (1.651 m)  Wt 141 lb (63.957 kg)  BMI 23.46 kg/m2  SpO2 99%  LMP 07/14/2015 (Approximate)  Physical Exam  Constitutional: She is oriented to person, place, and time. She appears well-developed and well-nourished. No distress.  HENT:  Head: Normocephalic and atraumatic.  Right Ear: External ear normal.  Left Ear: External ear normal.  Nose: Nose normal.  Eyes: Conjunctivae and EOM are normal. Pupils are equal, round, and reactive to light. No scleral icterus.  Neck: Normal range of motion. Neck supple. No tracheal deviation present.  Cardiovascular: Normal rate, regular rhythm and normal heart sounds.   Pulmonary/Chest: Effort normal. No respiratory distress. She has no wheezes. She has no rales.  Abdominal: She exhibits no mass. There is no tenderness. There is no rebound and no guarding.  Musculoskeletal: She exhibits no edema.  Lymphadenopathy:    She has no cervical adenopathy.    Neurological: She is alert and oriented to person, place, and time. Coordination normal.  Skin: Skin is warm and dry. No rash noted.  Psychiatric: She has a normal mood and affect. Her behavior is normal.      Assessment & Plan:   Sandra Moody was seen today for urinary frequency and back pain.  Diagnoses and all orders for this visit:  Acute cystitis without hematuria  Urinary frequency -     POCT urinalysis dipstick -     POCT Microscopic Urinalysis ([facility])  Other orders -     sulfamethoxazole-trimethoprim (BACTRIM DS,SEPTRA DS) 800-160 MG tablet; Take 1 tablet by mouth 2 (two) times daily. -     phenazopyridine (PYRIDIUM) 200 MG tablet; Take 1 tablet (200 mg total) by mouth 3 (three) times daily as needed.   I have discontinued Ms. Maybury's SIMETHICONE PO, Ca Carbonate-Mag Hydroxide (ROLAIDS PO), and cephALEXin. I am also having her start on sulfamethoxazole-trimethoprim and phenazopyridine. Additionally, I am having her maintain her albuterol, oxyCODONE-acetaminophen, Mometasone Furo-Formoterol Fum (DULERA IN), Eluxadoline (VIBERZI PO), and terbinafine.  Meds ordered this encounter  Medications  . Mometasone Furo-Formoterol Fum (DULERA IN)    Sig: Inhale into the lungs.  . Eluxadoline (VIBERZI PO)    Sig: Take by mouth.  . terbinafine (LAMISIL) 250 MG tablet    Sig: Take 250 mg by mouth daily.  Marland Kitchen sulfamethoxazole-trimethoprim (BACTRIM DS,SEPTRA DS) 800-160 MG tablet    Sig: Take 1 tablet by mouth 2 (two) times daily.    Dispense:  20 tablet    Refill:  0  . phenazopyridine (PYRIDIUM) 200 MG tablet    Sig: Take 1 tablet (200 mg total) by mouth 3 (three) times daily as needed.    Dispense:  6 tablet    Refill:  0    Appropriate red flag conditions were discussed with the patient as well as actions that should be taken.  Patient expressed his understanding.  Follow-up: Return if symptoms worsen or fail to improve.  Carmelina Dane, MD   Results for orders placed or  performed in visit on 07/16/15  POCT urinalysis dipstick  Result Value Ref Range   Color, UA yellow yellow   Clarity, UA cloudy (A) clear   Glucose, UA negative negative   Bilirubin, UA negative negative   Ketones, POC UA trace (5) (A) negative   Spec Grav, UA 1.020    Blood, UA moderate (A) negative   pH, UA 5.5    Protein Ur, POC =30 (A) negative   Urobilinogen, UA 0.2    Nitrite, UA Negative Negative   Leukocytes, UA moderate (2+) (A) Negative  POCT Microscopic Urinalysis ([facility])  Result Value Ref Range   WBC,UR,HPF,POC Too numerous to count  (A) None WBC/hpf   RBC,UR,HPF,POC Too numerous to count  (A) None RBC/hpf   Bacteria Few (A) None, Too numerous to count   Mucus Absent Absent   Epithelial Cells, UR Per Microscopy Few (A) None, Too numerous to count cells/hpf

## 2015-07-19 DIAGNOSIS — M79644 Pain in right finger(s): Secondary | ICD-10-CM | POA: Insufficient documentation

## 2015-10-04 ENCOUNTER — Ambulatory Visit (INDEPENDENT_AMBULATORY_CARE_PROVIDER_SITE_OTHER): Payer: BLUE CROSS/BLUE SHIELD | Admitting: Internal Medicine

## 2015-10-04 VITALS — BP 130/80 | HR 88 | Temp 98.3°F | Resp 20 | Ht 65.35 in | Wt 138.6 lb

## 2015-10-04 DIAGNOSIS — R519 Headache, unspecified: Secondary | ICD-10-CM

## 2015-10-04 DIAGNOSIS — J4521 Mild intermittent asthma with (acute) exacerbation: Secondary | ICD-10-CM | POA: Diagnosis not present

## 2015-10-04 DIAGNOSIS — J988 Other specified respiratory disorders: Secondary | ICD-10-CM

## 2015-10-04 DIAGNOSIS — J45901 Unspecified asthma with (acute) exacerbation: Secondary | ICD-10-CM | POA: Insufficient documentation

## 2015-10-04 DIAGNOSIS — R51 Headache: Secondary | ICD-10-CM | POA: Diagnosis not present

## 2015-10-04 DIAGNOSIS — R350 Frequency of micturition: Secondary | ICD-10-CM | POA: Diagnosis not present

## 2015-10-04 DIAGNOSIS — IMO0001 Reserved for inherently not codable concepts without codable children: Secondary | ICD-10-CM

## 2015-10-04 DIAGNOSIS — J22 Unspecified acute lower respiratory infection: Secondary | ICD-10-CM

## 2015-10-04 LAB — POCT URINALYSIS DIP (MANUAL ENTRY)
Bilirubin, UA: NEGATIVE
Blood, UA: NEGATIVE
Glucose, UA: NEGATIVE
Leukocytes, UA: NEGATIVE
Nitrite, UA: NEGATIVE
Protein Ur, POC: 100 — AB
Spec Grav, UA: 1.025
Urobilinogen, UA: 0.2
pH, UA: 6

## 2015-10-04 LAB — POCT CBC
Granulocyte percent: 25.7 %G — AB (ref 37–80)
HCT, POC: 37.8 % (ref 37.7–47.9)
Hemoglobin: 13.5 g/dL (ref 12.2–16.2)
Lymph, poc: 2.7 (ref 0.6–3.4)
MCH, POC: 32.1 pg — AB (ref 27–31.2)
MCHC: 35.7 g/dL — AB (ref 31.8–35.4)
MCV: 89.9 fL (ref 80–97)
MID (cbc): 0.4 (ref 0–0.9)
MPV: 8.1 fL (ref 0–99.8)
POC Granulocyte: 1.1 — AB (ref 2–6.9)
POC LYMPH PERCENT: 65.6 %L — AB (ref 10–50)
POC MID %: 8.7 %M (ref 0–12)
Platelet Count, POC: 237 10*3/uL (ref 142–424)
RBC: 4.2 M/uL (ref 4.04–5.48)
RDW, POC: 13.3 %
WBC: 4.1 10*3/uL — AB (ref 4.6–10.2)

## 2015-10-04 LAB — POC MICROSCOPIC URINALYSIS (UMFC)

## 2015-10-04 MED ORDER — HYDROCODONE-HOMATROPINE 5-1.5 MG/5ML PO SYRP: 5.0000 mL | ORAL_SOLUTION | Freq: Four times a day (QID) | ORAL | Status: DC | PRN

## 2015-10-04 MED ORDER — KETOROLAC TROMETHAMINE 60 MG/2ML IM SOLN
60.0000 mg | Freq: Once | INTRAMUSCULAR | Status: AC
  Administered 2015-10-04: 60 mg via INTRAMUSCULAR

## 2015-10-04 MED ORDER — AZITHROMYCIN 250 MG PO TABS: ORAL_TABLET | ORAL | Status: DC

## 2015-10-04 NOTE — Progress Notes (Addendum)
Subjective:  By signing my name below, I, Sandra Moody, attest that this documentation has been prepared under the direction and in the presence of Sandra Siaobert Nayvie Lips, MD. Electronically Signed: Stann Oresung-Kai Moody, Scribe. 10/04/2015 , 6:39 PM .  Patient was seen in Room 14 .   Patient ID: Sandra Moody, female    DOB: Mar 02, 1971, 45 y.o.   MRN: [id number] Chief Complaint  Patient presents with  . Headache    yesterday   . Dizziness  . Blurred Vision   HPI Sandra Moody is a 45 y.o. female who has h/o asthma [facility] complaining of headache with dizziness that worsened today. She reports that she was diagnosed with the flu 5 days ago and was started on Tamiflu at that time. Yesterday, she felt really fatigue with some backache and chills. Today, she felt even worse with headache, dizziness and blurry vision. She also mentions having some coughs, appetite loss, and increased urinary frequency.   Patient Active Problem List   Diagnosis Date Noted  . Other and unspecified ovarian cyst 06/17/2012  . Pelvic pain in female 06/17/2012  . Bacterial vaginosis 06/17/2012  Although it is not mentioned on her problem list she has a history of asthma and currently has inhalers at home which she has been off of:  Current outpatient prescriptions:  .  albuterol (PROVENTIL) (2.5 MG/3ML) 0.083% nebulizer solution, Take 2.5 mg by nebulization every 6 (six) hours as needed for wheezing or shortness of breath., Disp: , Rfl:  .  Mometasone Furo-Formoterol Fum (DULERA IN), Inhale into the lungs., Disp: , Rfl:     Review of Systems  Constitutional: Positive for chills, appetite change and fatigue. Negative for fever and diaphoresis.  Eyes: Positive for photophobia and visual disturbance.  Respiratory: Positive for cough.   Genitourinary: Positive for frequency.  Musculoskeletal: Positive for myalgias and back pain.  Neurological: Positive for dizziness, light-headedness and headaches.        Objective:   Physical Exam  Constitutional: She is oriented to person, place, and time. She appears well-developed and well-nourished. No distress.  HENT:  Head: Normocephalic and atraumatic.  Right Ear: Tympanic membrane normal.  Left Ear: Tympanic membrane normal.  Nose: Nose normal.  Mouth/Throat: Oropharynx is clear and moist.  Eyes: Conjunctivae and EOM are normal. Pupils are equal, round, and reactive to light.  Neck: Neck supple.  Cardiovascular: Normal rate.   No murmur heard. Pulmonary/Chest: Effort normal. No respiratory distress. She has wheezes (wheezing bilaterally on expiration). She has rhonchi in the right lower field.  Musculoskeletal: Normal range of motion.  Lymphadenopathy:    She has no cervical adenopathy.  Neurological: She is alert and oriented to person, place, and time. No cranial nerve deficit.  Skin: Skin is warm and dry.  Psychiatric: She has a normal mood and affect. Her behavior is normal.  Nursing note and vitals reviewed.   BP 130/80 mmHg  Pulse 88  Temp(Src) 98.3 F (36.8 C) (Oral)  Resp 20  Ht 5' 5.35" (1.66 m)  Wt 138 lb 9.6 oz (62.869 kg)  BMI 22.81 kg/m2  SpO2 98%  LMP 09/15/2015   Results for orders placed or performed in visit on 10/04/15  POCT CBC  Result Value Ref Range   WBC 4.1 (A) 4.6 - 10.2 K/uL   Lymph, poc 2.7 0.6 - 3.4   POC LYMPH PERCENT 65.6 (A) 10 - 50 %L   MID (cbc) 0.4 0 - 0.9   POC MID % 8.7  0 - 12 %M   POC Granulocyte 1.1 (A) 2 - 6.9   Granulocyte percent 25.7 (A) 37 - 80 %G   RBC 4.20 4.04 - 5.48 M/uL   Hemoglobin 13.5 12.2 - 16.2 g/dL   HCT, POC 16.1 09.6 - 47.9 %   MCV 89.9 80 - 97 fL   MCH, POC 32.1 (A) 27 - 31.2 pg   MCHC 35.7 (A) 31.8 - 35.4 g/dL   RDW, POC 04.5 %   Platelet Count, POC 237 142 - 424 K/uL   MPV 8.1 0 - 99.8 fL  POCT urinalysis dipstick  Result Value Ref Range   Color, UA yellow yellow   Clarity, UA clear clear   Glucose, UA negative negative   Bilirubin, UA negative negative    Ketones, POC UA trace (5) (A) negative   Spec Grav, UA 1.025    Blood, UA negative negative   pH, UA 6.0    Protein Ur, POC =100 (A) negative   Urobilinogen, UA 0.2    Nitrite, UA Negative Negative   Leukocytes, UA Negative Negative  POCT Microscopic Urinalysis ([facility])  Result Value Ref Range   WBC,UR,HPF,POC None None WBC/hpf   RBC,UR,HPF,POC None None RBC/hpf   Bacteria None None, Too numerous to count   Mucus Present (A) Absent   Epithelial Cells, UR Per Microscopy Moderate (A) None, Too numerous to count cells/hpf      Assessment & Plan:  Acute nonintractable headache, Secondary to worsening of fluid or possibly as a side effect Tamiflu- ketorolac (TORADOL) injection 60 mg  Frequency -with normal urinalysis  Lower respiratory infection (e.g., bronchitis, pneumonia, pneumonitis, pulmonitis)  Asthma with acute exacerbation, mild intermittent  Meds ordered this encounter  Medications  . ketorolac (TORADOL) injection 60 mg    Sig:   . azithromycin (ZITHROMAX) 250 MG tablet    Sig: As packaged    Dispense:  6 tablet    Refill:  0  . HYDROcodone-homatropine (HYCODAN) 5-1.5 MG/5ML syrup    Sig: Take 5 mLs by mouth every 6 (six) hours as needed.    Dispense:  120 mL    Refill:  0   Restart home medications: Dulera and albuterol Recheck in 48 hours if not responding     I have completed the patient encounter in its entirety as documented by the scribe, with editing by me where necessary. Gershom Brobeck P. Merla Riches, M.D.

## 2015-10-04 NOTE — Patient Instructions (Signed)
Use your dulera and albuterol

## 2015-11-11 ENCOUNTER — Ambulatory Visit (INDEPENDENT_AMBULATORY_CARE_PROVIDER_SITE_OTHER): Payer: BLUE CROSS/BLUE SHIELD | Admitting: Family Medicine

## 2015-11-11 ENCOUNTER — Emergency Department (HOSPITAL_COMMUNITY): Payer: BLUE CROSS/BLUE SHIELD

## 2015-11-11 ENCOUNTER — Encounter (HOSPITAL_COMMUNITY): Payer: Self-pay | Admitting: Emergency Medicine

## 2015-11-11 ENCOUNTER — Ambulatory Visit (INDEPENDENT_AMBULATORY_CARE_PROVIDER_SITE_OTHER): Payer: BLUE CROSS/BLUE SHIELD

## 2015-11-11 ENCOUNTER — Emergency Department (HOSPITAL_COMMUNITY)
Admission: EM | Admit: 2015-11-11 | Discharge: 2015-11-12 | Disposition: A | Discharge status: Home / Self Care | Payer: BLUE CROSS/BLUE SHIELD | Attending: Emergency Medicine | Admitting: Emergency Medicine

## 2015-11-11 VITALS — BP 135/78 | HR 78 | Temp 98.7°F | Resp 16 | Ht 65.0 in | Wt 140.0 lb

## 2015-11-11 DIAGNOSIS — R9431 Abnormal electrocardiogram [ECG] [EKG]: Secondary | ICD-10-CM

## 2015-11-11 DIAGNOSIS — J4541 Moderate persistent asthma with (acute) exacerbation: Secondary | ICD-10-CM

## 2015-11-11 DIAGNOSIS — R059 Cough, unspecified: Secondary | ICD-10-CM

## 2015-11-11 DIAGNOSIS — R0781 Pleurodynia: Secondary | ICD-10-CM

## 2015-11-11 DIAGNOSIS — J45901 Unspecified asthma with (acute) exacerbation: Secondary | ICD-10-CM | POA: Diagnosis not present

## 2015-11-11 DIAGNOSIS — R079 Chest pain, unspecified: Secondary | ICD-10-CM | POA: Diagnosis present

## 2015-11-11 DIAGNOSIS — R5383 Other fatigue: Secondary | ICD-10-CM

## 2015-11-11 DIAGNOSIS — R05 Cough: Secondary | ICD-10-CM | POA: Diagnosis not present

## 2015-11-11 DIAGNOSIS — Z3202 Encounter for pregnancy test, result negative: Secondary | ICD-10-CM | POA: Insufficient documentation

## 2015-11-11 DIAGNOSIS — R0600 Dyspnea, unspecified: Secondary | ICD-10-CM

## 2015-11-11 LAB — POCT CBC
Granulocyte percent: 55.4 %G (ref 37–80)
HCT, POC: 37.4 % — AB (ref 37.7–47.9)
Hemoglobin: 12.8 g/dL (ref 12.2–16.2)
Lymph, poc: 2.7 (ref 0.6–3.4)
MCH, POC: 31.6 pg — AB (ref 27–31.2)
MCHC: 34.3 g/dL (ref 31.8–35.4)
MCV: 92.1 fL (ref 80–97)
MID (cbc): 0.2 (ref 0–0.9)
MPV: 7.8 fL (ref 0–99.8)
POC Granulocyte: 3.5 (ref 2–6.9)
POC LYMPH PERCENT: 42 %L (ref 10–50)
POC MID %: 2.6 %M (ref 0–12)
Platelet Count, POC: 300 10*3/uL (ref 142–424)
RBC: 4.06 M/uL (ref 4.04–5.48)
RDW, POC: 13.3 %
WBC: 6.4 10*3/uL (ref 4.6–10.2)

## 2015-11-11 LAB — CBC
HCT: 39.3 % (ref 36.0–46.0)
Hemoglobin: 12.6 g/dL (ref 12.0–15.0)
MCH: 30.3 pg (ref 26.0–34.0)
MCHC: 32.1 g/dL (ref 30.0–36.0)
MCV: 94.5 fL (ref 78.0–100.0)
Platelets: 339 10*3/uL (ref 150–400)
RBC: 4.16 MIL/uL (ref 3.87–5.11)
RDW: 13 % (ref 11.5–15.5)
WBC: 6.4 10*3/uL (ref 4.0–10.5)

## 2015-11-11 LAB — BASIC METABOLIC PANEL
Anion gap: 12 (ref 5–15)
BUN: 8 mg/dL (ref 6–20)
CO2: 24 mmol/L (ref 22–32)
Calcium: 8.8 mg/dL — ABNORMAL LOW (ref 8.9–10.3)
Chloride: 103 mmol/L (ref 101–111)
Creatinine, Ser: 0.73 mg/dL (ref 0.44–1.00)
GFR calc Af Amer: >60 mL/min (ref >60)
GFR calc non Af Amer: >60 mL/min (ref >60)
Glucose, Bld: 103 mg/dL — ABNORMAL HIGH (ref 65–99)
Potassium: 3.5 mmol/L (ref 3.5–5.1)
Sodium: 139 mmol/L (ref 135–145)

## 2015-11-11 LAB — I-STAT TROPONIN, ED: Troponin i, poc: 0 ng/mL (ref 0.00–0.08)

## 2015-11-11 LAB — D-DIMER, QUANTITATIVE: D-Dimer, Quant: 0.77 ug/mL-FEU — ABNORMAL HIGH (ref 0.00–0.48)

## 2015-11-11 LAB — POC URINE PREG, ED: Preg Test, Ur: NEGATIVE

## 2015-11-11 MED ORDER — IOPAMIDOL (ISOVUE-370) INJECTION 76%
INTRAVENOUS | Status: AC
  Administered 2015-11-11: 100 mL
  Filled 2015-11-11: qty 100

## 2015-11-11 MED ORDER — IPRATROPIUM BROMIDE 0.02 % IN SOLN
0.5000 mg | Freq: Once | RESPIRATORY_TRACT | Status: AC
  Administered 2015-11-11: 0.5 mg via RESPIRATORY_TRACT

## 2015-11-11 MED ORDER — ALBUTEROL SULFATE (2.5 MG/3ML) 0.083% IN NEBU
2.5000 mg | INHALATION_SOLUTION | Freq: Once | RESPIRATORY_TRACT | Status: AC
  Administered 2015-11-11: 2.5 mg via RESPIRATORY_TRACT

## 2015-11-11 NOTE — ED Notes (Signed)
MD at bedside. 

## 2015-11-11 NOTE — Progress Notes (Addendum)
Subjective:  By signing my name below, I, [name], attest that this documentation has been prepared under the direction and in the presence of Meredith StaggersJeffrey Katalina Magri, MD. Electronically Signed: [name], Scribe. 11/11/2015 , 6:43 PM .  Patient was seen in Room 1 .   Patient ID: Sandra Moody, female    DOB: [birth year], 45 y.o.   MRN: [id number] Chief Complaint  Patient presents with  . Asthma    hard for pt to inhale  . Chest Pain    tightness/ x2 days  . Fatigue    x 2 days   HPI Sandra Moody is a 45 y.o. female  H/o asthma. She was treated with a zpak in March.  Patient states having sharp chest tightness and heaviness in her chest, making it hard to breathe yesterday. She used her proair inhaler for relief and was able to make it through the day. Today, patient reports feeling worse; she's tried using her inhaler again but without any relief, last use 1 hour ago. She states feeling short of breath while in office. She usually uses her proair inhaler everyday, 4-6 puffs a day because she's off of her dulera inhaler for 3 months now due to cost. This was prescribed by [facility]. She's gone through 3 proair inhalers over the past 3 months. She called her insurance and was given a list of inhalers they'll cover.   She has a nebulizer at home, but she hasn't used this yet. She was on advair previously and did okay with it.   She feels fatigue with no energy to move around. She mentions having surgery to remove a pin in her left foot 2 days ago.  She denies history of blood clots. She denies recent long distance travel. She feels more fatigue than before with asthma. She notes "less wheezing than tickle sensation in her chest". She denies chest pain, or heart problems. She denies family history of heart problems.   Patient also has reports having seasonal allergies. She mentions having hot and cold spells last night with possible fever. She notes coughing a lot  at night with a tickling in the back of her throat. She's noticed ear drainage with itching during the night and ear dryness in the morning. She's used flonase nasal spray, 1 spray in each nostril, and also [employer] zyrtec for her allergies. She denies rhinorrhea.   Patient Active Problem List   Diagnosis Date Noted  . Asthma with acute exacerbation 10/04/2015  . Other and unspecified ovarian cyst 06/17/2012  . Pelvic pain in female 06/17/2012  . Bacterial vaginosis 06/17/2012   Past Medical History  Diagnosis Date  . Asthma    Past Surgical History  Procedure Laterality Date  . Tubal ligation     Allergies  Allergen Reactions  . Ginger Itching and Swelling  . Passion Fruit Flavor [Flavoring Agent] Itching and Swelling  . Peanuts [Peanut Oil] Anaphylaxis    PT ALLERGIC TO ALL TREE NUTS   Prior to Admission medications   Medication Sig Start Date End Date Taking? Authorizing Provider  albuterol (PROVENTIL) (2.5 MG/3ML) 0.083% nebulizer solution Take 2.5 mg by nebulization every 6 (six) hours as needed for wheezing or shortness of breath. Reported on 11/11/2015   Yes Historical Provider, MD  azithromycin (ZITHROMAX) 250 MG tablet As packaged Patient not taking: Reported on 11/11/2015 10/04/15   Tonye Pearsonobert P Doolittle, MD  HYDROcodone-homatropine [facility](HYCODAN) 5-1.5 MG/5ML syrup Take 5 mLs by mouth every 6 (  six) hours as needed. Patient not taking: Reported on 11/11/2015 10/04/15   Tonye Pearson, MD  Mometasone Furo-Formoterol Fum [facility] [place]) Inhale into the lungs. Reported on 11/11/2015    Historical Provider, MD   Social History   Social History  . Marital Status: Divorced    Spouse Name: N/A  . Number of Children: N/A  . Years of Education: N/A   Occupational History  . Not on file.   Social History Main Topics  . Smoking status: Never Smoker   . Smokeless tobacco: Never Used  . Alcohol Use: Yes     Comment: occasional  . Drug Use: No  . Sexual Activity: Yes   Other  Topics Concern  . Not on file   Social History Narrative   Review of Systems  Constitutional: Positive for fever, chills and fatigue.  HENT: Positive for ear discharge. Negative for congestion and rhinorrhea.   Respiratory: Positive for cough, chest tightness, shortness of breath and wheezing.   Cardiovascular: Negative for chest pain.  Gastrointestinal: Negative for nausea and vomiting.  Allergic/Immunologic: Positive for environmental allergies.       Objective:   Physical Exam  Constitutional: She is oriented to person, place, and time. She appears well-developed and well-nourished. No distress.  She speaks in complete sentences and takes deep breaths with speech sometimes during visit  HENT:  Head: Normocephalic and atraumatic.  Right Ear: Hearing, tympanic membrane, external ear and ear canal normal.  Left Ear: Hearing, tympanic membrane, external ear and ear canal normal.  Nose: Nose normal.  Mouth/Throat: Oropharynx is clear and moist. No oropharyngeal exudate.  Dry skin at end of each ear canals, tm's pearly gray  Eyes: Conjunctivae and EOM are normal. Pupils are equal, round, and reactive to light.  Cardiovascular: Normal rate, regular rhythm, normal heart sounds and intact distal pulses.   No murmur heard. Pulmonary/Chest: Effort normal and breath sounds normal. No respiratory distress. She has no wheezes. She has no rhonchi.  Neurological: She is alert and oriented to person, place, and time.  Skin: Skin is warm and dry. No rash noted.  Psychiatric: She has a normal mood and affect. Her behavior is normal.  Vitals reviewed.   Filed Vitals:   11/11/15 1802  BP: 135/78  Pulse: 78  Temp: 98.7 F (37.1 C)  TempSrc: Oral  Resp: 16  Height: 5\' 5"  (1.651 m)  Weight: 140 lb (63.504 kg)  SpO2: 96%   6:56 PM Nebulizer treatment: albuterol 2.5 mg/atrovent 0.5 mg given: No change in symptoms of chest pain with inspiration or dyspnea. No wheeze on exam, lungs remain  clear.  Denies radiation of her chest pain, notes primarily with deep inspiration. Otherwise feels like can't get a deep breath, unchanged with treatment in office.  EKG: T-wave inversion in lead III and v3, non specific, but not seen on prior EKG.  No other apparent ST/T wave changes.  Dg Chest 2 View  11/11/2015  CLINICAL DATA:  Acute onset of generalized chest pain. Difficulty breathing. Initial encounter. EXAM: CHEST  2 VIEW COMPARISON:  Chest radiograph performed 05/19/2013 FINDINGS: The lungs are well-aerated and clear. There is no evidence of focal opacification, pleural effusion or pneumothorax. The heart is normal in size; the mediastinal contour is within normal limits. No acute osseous abnormalities are seen. IMPRESSION: No acute cardiopulmonary process seen. Electronically Signed   By: Roanna Raider M.D.   On: 11/11/2015 18:39   Results for orders placed or performed in visit on 11/11/15  POCT CBC  Result Value Ref Range   WBC 6.4 4.6 - 10.2 K/uL   Lymph, poc 2.7 0.6 - 3.4   POC LYMPH PERCENT 42.0 10 - 50 %L   MID (cbc) 0.2 0 - 0.9   POC MID % 2.6 0 - 12 %M   POC Granulocyte 3.5 2 - 6.9   Granulocyte percent 55.4 37 - 80 %G   RBC 4.06 4.04 - 5.48 M/uL   Hemoglobin 12.8 12.2 - 16.2 g/dL   HCT, POC 16.1 (A) 09.6 - 47.9 %   MCV 92.1 80 - 97 fL   MCH, POC 31.6 (A) 27 - 31.2 pg   MCHC 34.3 31.8 - 35.4 g/dL   RDW, POC 04.5 %   Platelet Count, POC 300 142 - 424 K/uL   MPV 7.8 0 - 99.8 fL      Assessment & Plan:    SUEELLEN KAYES is a 45 y.o. female Cough - Plan: DG Chest 2 View, POCT CBC  Chest pain, unspecified chest pain type - Plan: EKG 12-Lead, DG Chest 2 View, POCT CBC, D-dimer, quantitative (not at [facility])  Asthma, moderate persistent, with acute exacerbation - Plan: ipratropium (ATROVENT) nebulizer solution 0.5 mg, albuterol (PROVENTIL) (2.5 MG/3ML) 0.083% nebulizer solution 2.5 mg  Dyspnea - Plan: POCT CBC, D-dimer, quantitative (not at [facility])  Other  fatigue  Pleuritic chest pain  Nonspecific abnormal electrocardiogram (ECG) (EKG)  History of asthma, with recent treatment of albuterol only multiple times per day for the past few months. Reports acute worsening of symptoms since yesterday afternoon with difficulty taking in deep breath, and pleuritic chest pain. Did have a pin removed from her foot one day prior. No recent calf pain or swelling. No known history of DVT or coagulopathy. No known history of cardiac disease or known family history of cardiac disease, but does not know family history well. Attempted treatment with albuterol and Atrovent neb without any change in symptoms, and her lungs were clear on both initial exam and repeat exam after the nebulizer treatments. Nonspecific T waves in leads III and V3 on EKG that was not present previously, but not seen on contiguous leads. Overall reassuring vitals and CBC. Does not appear to be asthmatic at this time, and with recent surgery, d-dimer obtained for possible DVT/PE( sent stat, but this will not likely be back for the next few hours). Less likely cardiac, but does have a change in EKG as above.  -Recommended further evaluation through the emergency room. EMS transport offered, but she declined and plans on driving herself. 911 precautions discussed if any changes in route to the ER. Understanding expressed.   Meds ordered this encounter  Medications  . ipratropium (ATROVENT) nebulizer solution 0.5 mg    Sig:   . albuterol (PROVENTIL) (2.5 MG/3ML) 0.083% nebulizer solution 2.5 mg    Sig:    Patient Instructions       IF you received an x-ray today, you will receive an invoice from [facility]. Please contact [facility] at [phone] with questions or concerns regarding your invoice.   IF you received labwork today, you will receive an invoice from [employer]. Please contact [facility] at [phone] with questions or concerns  regarding your invoice.   Our billing staff will not be able to assist you with questions regarding bills from these companies.  You will be contacted with the lab results as soon as they are available. The fastest way to get your results is  to activate your My Chart account. Instructions are located on the last page of this paperwork. If you have not heard from [place] regarding the results in 2 weeks, please contact this office.     As I am unable to improve your symptoms with treatment of asthma here tonight, and there are some possible changes in your EKG, recommend further evaluation through the emergency room.  I did check a blood clot tests, but that will not be back for a few hours. Emergency room can check other tests or evaluation as needed. Proceed they're directly after leaving our office, and if any change in your symptoms on the way there, pull over and call 911.   Nonspecific Chest Pain  Chest pain can be caused by many different conditions. There is always a chance that your pain could be related to something serious, such as a heart attack or a blood clot in your lungs. Chest pain can also be caused by conditions that are not life-threatening. If you have chest pain, it is very important to follow up with your health care provider. CAUSES  Chest pain can be caused by:  Heartburn.  Pneumonia or bronchitis.  Anxiety or stress.  Inflammation around your heart (pericarditis) or lung (pleuritis or pleurisy).  A blood clot in your lung.  A collapsed lung (pneumothorax). It can develop suddenly on its own (spontaneous pneumothorax) or from trauma to the chest.  Shingles infection (varicella-zoster virus).  Heart attack.  Damage to the bones, muscles, and cartilage that make up your chest wall. This can include:  Bruised bones due to injury.  Strained muscles or cartilage due to frequent or repeated coughing or overwork.  Fracture to one or more ribs.  Sore cartilage due  to inflammation (costochondritis). RISK FACTORS  Risk factors for chest pain may include:  Activities that increase your risk for trauma or injury to your chest.  Respiratory infections or conditions that cause frequent coughing.  Medical conditions or overeating that can cause heartburn.  Heart disease or family history of heart disease.  Conditions or health behaviors that increase your risk of developing a blood clot.  Having had chicken pox (varicella zoster). SIGNS AND SYMPTOMS Chest pain can feel like:  Burning or tingling on the surface of your chest or deep in your chest.  Crushing, pressure, aching, or squeezing pain.  Dull or sharp pain that is worse when you move, cough, or take a deep breath.  Pain that is also felt in your back, neck, shoulder, or arm, or pain that spreads to any of these areas. Your chest pain may come and go, or it may stay constant. DIAGNOSIS Lab tests or other studies may be needed to find the cause of your pain. Your health care provider may have you take a test called an ambulatory ECG (electrocardiogram). An ECG records your heartbeat patterns at the time the test is performed. You may also have other tests, such as:  Transthoracic echocardiogram (TTE). During echocardiography, sound waves are used to create a picture of all of the heart structures and to look at how blood flows through your heart.  Transesophageal echocardiogram (TEE).This is a more advanced imaging test that obtains images from inside your body. It allows your health care provider to see your heart in finer detail.  Cardiac monitoring. This allows your health care provider to monitor your heart rate and rhythm in real time.  Holter monitor. This is a portable device that records your heartbeat and can  help to diagnose abnormal heartbeats. It allows your health care provider to track your heart activity for several days, if needed.  Stress tests. These can be done through  exercise or by taking medicine that makes your heart beat more quickly.  Blood tests.  Imaging tests. TREATMENT  Your treatment depends on what is causing your chest pain. Treatment may include:  Medicines. These may include:  Acid blockers for heartburn.  Anti-inflammatory medicine.  Pain medicine for inflammatory conditions.  Antibiotic medicine, if an infection is present.  Medicines to dissolve blood clots.  Medicines to treat coronary artery disease.  Supportive care for conditions that do not require medicines. This may include:  Resting.  Applying heat or cold packs to injured areas.  Limiting activities until pain decreases. HOME CARE INSTRUCTIONS  If you were prescribed an antibiotic medicine, finish it all even if you start to feel better.  Avoid any activities that bring on chest pain.  Do not use any tobacco products, including cigarettes, chewing tobacco, or electronic cigarettes. If you need help quitting, ask your health care provider.  Do not drink alcohol.  Take medicines only as directed by your health care provider.  Keep all follow-up visits as directed by your health care provider. This is important. This includes any further testing if your chest pain does not go away.  If heartburn is the cause for your chest pain, you may be told to keep your head raised (elevated) while sleeping. This reduces the chance that acid will go from your stomach into your esophagus.  Make lifestyle changes as directed by your health care provider. These may include:  Getting regular exercise. Ask your health care provider to suggest some activities that are safe for you.  Eating a heart-healthy diet. A registered dietitian can help you to learn healthy eating options.  Maintaining a healthy weight.  Managing diabetes, if necessary.  Reducing stress. SEEK MEDICAL CARE IF:  Your chest pain does not go away after treatment.  You have a rash with blisters on  your chest.  You have a fever. SEEK IMMEDIATE MEDICAL CARE IF:   Your chest pain is worse.  You have an increasing cough, or you cough up blood.  You have severe abdominal pain.  You have severe weakness.  You faint.  You have chills.  You have sudden, unexplained chest discomfort.  You have sudden, unexplained discomfort in your arms, back, neck, or jaw.  You have shortness of breath at any time.  You suddenly start to sweat, or your skin gets clammy.  You feel nauseous or you vomit.  You suddenly feel light-headed or dizzy.  Your heart begins to beat quickly, or it feels like it is skipping beats. These symptoms may represent a serious problem that is an emergency. Do not wait to see if the symptoms will go away. Get medical help right away. Call your local emergency services (911 in the [place] Do not drive yourself to the hospital.   This information is not intended to replace advice given to you by your health care provider. Make sure you discuss any questions you have with your health care provider.   Document Released: 04/26/2005 Document Revised: 08/07/2014 Document Reviewed: 02/20/2014 Elsevier Interactive Patient Education 2016 [employer].   Shortness of Breath Shortness of breath means you have trouble breathing. It could also mean that you have a medical problem. You should get immediate medical care for shortness of breath. CAUSES   Not enough oxygen in  the air such as with high altitudes or a smoke-filled room.  Certain lung diseases, infections, or problems.  Heart disease or conditions, such as angina or heart failure.  Low red blood cells (anemia).  Poor physical fitness, which can cause shortness of breath when you exercise.  Chest or back injuries or stiffness.  Being overweight.  Smoking.  Anxiety, which can make you feel like you are not getting enough air. DIAGNOSIS  Serious medical problems can often be found during your physical  exam. Tests may also be done to determine why you are having shortness of breath. Tests may include:  Chest X-rays.  Lung function tests.  Blood tests.  An electrocardiogram (ECG).  An ambulatory electrocardiogram. An ambulatory ECG records your heartbeat patterns over a 24-hour period.  Exercise testing.  A transthoracic echocardiogram (TTE). During echocardiography, sound waves are used to evaluate how blood flows through your heart.  A transesophageal echocardiogram (TEE).  Imaging scans. Your health care provider may not be able to find a cause for your shortness of breath after your exam. In this case, it is important to have a follow-up exam with your health care provider as directed.  TREATMENT  Treatment for shortness of breath depends on the cause of your symptoms and can vary greatly. HOME CARE INSTRUCTIONS   Do not smoke. Smoking is a common cause of shortness of breath. If you smoke, ask for help to quit.  Avoid being around chemicals or things that may bother your breathing, such as paint fumes and dust.  Rest as needed. Slowly resume your usual activities.  If medicines were prescribed, take them as directed for the full length of time directed. This includes oxygen and any inhaled medicines.  Keep all follow-up appointments as directed by your health care provider. SEEK MEDICAL CARE IF:   Your condition does not improve in the time expected.  You have a hard time doing your normal activities even with rest.  You have any new symptoms. SEEK IMMEDIATE MEDICAL CARE IF:   Your shortness of breath gets worse.  You feel light-headed, faint, or develop a cough not controlled with medicines.  You start coughing up blood.  You have pain with breathing.  You have chest pain or pain in your arms, shoulders, or abdomen.  You have a fever.  You are unable to walk up stairs or exercise the way you normally do. MAKE SURE YOU:  Understand these  instructions.  Will watch your condition.  Will get help right away if you are not doing well or get worse.   This information is not intended to replace advice given to you by your health care provider. Make sure you discuss any questions you have with your health care provider.   Document Released: 04/11/2001 Document Revised: 07/22/2013 Document Reviewed: 10/02/2011 Elsevier Interactive Patient Education [employer]     I personally performed the services described in this documentation, which was scribed in my presence. The recorded information has been reviewed and considered, and addended by me as needed.

## 2015-11-11 NOTE — ED Notes (Signed)
PA at bedside.

## 2015-11-11 NOTE — ED Provider Notes (Signed)
CSN: [id number]     Arrival date & time 11/11/15  2002 History   First MD Initiated Contact with Patient 11/11/15 2041     Chief Complaint  Patient presents with  . Chest Pain  . Abnormal Lab     (Consider location/radiation/quality/duration/timing/severity/associated sxs/prior Treatment) The history is provided by the patient and medical records.    45 year old female with history of asthma, presenting to the ED from urgent care for further evaluation of chest pain. He states since yesterday she has had some intermittent chest pain and shortness of breath. She states when she takes a deep breath she feels a sharp, stabbing pain that occurs in the left side of her chest. Denies any exertional chest pain. States she also feels like she "cannot get enough air".  Patient denies any cough, fever, nasal congestion, or other upper respiratory symptoms. While evaluated at urgent care, d-dimer was sent which is elevated. Patient has no history of DVT or PE. She did have recent foot surgery last week, doing well from this. She denies any recent travel, prolonged immobilization, or exogenous estrogens currently.  Patient's vital signs are stable on arrival.  Past Medical History  Diagnosis Date  . Asthma    Past Surgical History  Procedure Laterality Date  . Tubal ligation     Family History  Problem Relation Age of Onset  . Thyroid disease Mother   . Heart disease Mother    Social History  Substance Use Topics  . Smoking status: Never Smoker   . Smokeless tobacco: Never Used  . Alcohol Use: Yes     Comment: occasional   OB History    Gravida Para Term Preterm AB TAB SAB Ectopic Multiple Living   5 3 3  0 2 1 1  0 0 3     Review of Systems  Respiratory: Positive for shortness of breath.   Cardiovascular: Positive for chest pain.  All other systems reviewed and are negative.     Allergies  Ginger; Passion fruit flavor; and Peanuts  Home Medications   Prior to Admission  medications   Medication Sig Start Date End Date Taking? Authorizing Provider  albuterol (PROVENTIL) (2.5 MG/3ML) 0.083% nebulizer solution Take 2.5 mg by nebulization every 6 (six) hours as needed for wheezing or shortness of breath. Reported on 11/11/2015    Historical Provider, MD   BP 145/100 mmHg  Pulse 90  Temp(Src) 98.1 F (36.7 C) (Oral)  Resp 18  SpO2 99%  LMP 11/03/2015   Physical Exam  Constitutional: She is oriented to person, place, and time. She appears well-developed and well-nourished. No distress.  HENT:  Head: Normocephalic and atraumatic.  Mouth/Throat: Oropharynx is clear and moist.  Eyes: Conjunctivae and EOM are normal. Pupils are equal, round, and reactive to light.  Neck: Normal range of motion.  Cardiovascular: Normal rate, regular rhythm and normal heart sounds.   Pulmonary/Chest: Breath sounds normal. No respiratory distress. She has no wheezes.  No respiratory distress, speaking in full sentences without difficulty  Abdominal: Soft. Bowel sounds are normal.  Musculoskeletal: Normal range of motion.  No calf swelling, asymmetry, tenderness, or palpable cords; no overlying skin changes; DP pulses intact bilaterally  Neurological: She is alert and oriented to person, place, and time.  Skin: Skin is warm and dry. She is not diaphoretic.  Psychiatric: She has a normal mood and affect.  Nursing note and vitals reviewed.   ED Course  Procedures (including critical care time) Labs Review Labs Reviewed  BASIC  METABOLIC PANEL - Abnormal; Notable for the following:    Glucose, Bld 103 (*)    Calcium 8.8 (*)    All other components within normal limits  CBC  I-STAT TROPOININ, ED  POC URINE PREG, ED    Imaging Review Dg Chest 2 View  11/11/2015  CLINICAL DATA:  LEFT chest pain with shortness of breath and dry cough beginning yesterday, underwent foot surgery 2 days ago, elevated D-dimer EXAM: CHEST  2 VIEW COMPARISON:  Earlier exam of 11/11/2015 FINDINGS:  Normal heart size, mediastinal contours, and pulmonary vascularity. Lungs clear. No pleural effusion or pneumothorax. Bones unremarkable. IMPRESSION: Normal exam, unchanged. Electronically Signed   By: Ulyses Southward M.D.   On: 11/11/2015 20:39   Dg Chest 2 View  11/11/2015  CLINICAL DATA:  Acute onset of generalized chest pain. Difficulty breathing. Initial encounter. EXAM: CHEST  2 VIEW COMPARISON:  Chest radiograph performed 05/19/2013 FINDINGS: The lungs are well-aerated and clear. There is no evidence of focal opacification, pleural effusion or pneumothorax. The heart is normal in size; the mediastinal contour is within normal limits. No acute osseous abnormalities are seen. IMPRESSION: No acute cardiopulmonary process seen. Electronically Signed   By: Roanna Raider M.D.   On: 11/11/2015 18:39   Ct Angio Chest Pe W/cm &/or Wo Cm  11/11/2015  CLINICAL DATA:  Chest pain with intermittent shortness of breath EXAM: CT ANGIOGRAPHY CHEST WITH CONTRAST TECHNIQUE: Multidetector CT imaging of the chest was performed using the standard protocol during bolus administration of intravenous contrast. Multiplanar CT image reconstructions and MIPs were obtained to evaluate the vascular anatomy. CONTRAST:  80 mL Isovue 370 nonionic COMPARISON:  Chest radiograph November 11, 2015 FINDINGS: Mediastinum/Lymph Nodes: There is no demonstrable pulmonary embolus. There is no thoracic aortic aneurysm or dissection. The pericardium is not thickened. Visualized thyroid appears normal. There is no appreciable adenopathy. Lungs/Pleura: There is no edema or consolidation. Upper abdomen: Visualized upper abdominal structures appear unremarkable. Musculoskeletal: There are no blastic or lytic bone lesions. Review of the MIP images confirms the above findings. IMPRESSION: No demonstrable pulmonary embolus. No edema or consolidation. No appreciable adenopathy. Electronically Signed   By: Bretta Bang III M.D.   On: 11/11/2015 23:22   I  have personally reviewed and evaluated these images and lab results as part of my medical decision-making.   EKG Interpretation None      MDM   Final diagnoses:  Chest pain, unspecified chest pain type   45 year old female here with chest pain and intermittent shortness of breath. Recent foot surgery last week. No other risk factors for DVT or PE.   Seen at urgent care found to have elevated d-dimer and sent here for further evaluation.  Patient is afebrile, nontoxic. Reports pain worse with deep inspiration, feels she cannot "get enough air".  EKG NSR, no acute ischemic changes.  Labwork is reassuring. Chest x-ray is clear. CTA of chest is negative for acute PE. No other infectious process noted.  Patient's vital signs remained stable on room air.  Patient with minimal cardiac RF, HEART score of 1.  At this time given negative work-up, feel patient is stable for discharge.  Encouraged to follow-up with PCP.  Given refill of albuterol inhaler at her request.  Discussed plan with patient, he/she acknowledged understanding and agreed with plan of care.  Return precautions given for new or worsening symptoms.  Garlon Hatchet, PA-C 11/12/15 0008  Arby Barrette, MD 11/12/15 0040

## 2015-11-11 NOTE — ED Notes (Signed)
Dr. Seth BakePfiefer ( EDP ) notified on pt.'s transfer from [facility] urgent care and elevated d-dimer , will transfer to next available bed.

## 2015-11-11 NOTE — ED Notes (Signed)
Pt. reports left chest pain with SOB and dry cough onset yesterday , recent foot surgery last Tuesday , [facility] this evening sent here for further evaluation due to elevated D-dimer result 0.77 .

## 2015-11-11 NOTE — Patient Instructions (Addendum)
IF you received an x-ray today, you will receive an invoice from [facility]. Please contact [facility] at [phone] with questions or concerns regarding your invoice.   IF you received labwork today, you will receive an invoice from [employer]. Please contact [facility] at [phone] with questions or concerns regarding your invoice.   Our billing staff will not be able to assist you with questions regarding bills from these companies.  You will be contacted with the lab results as soon as they are available. The fastest way to get your results is to activate your My Chart account. Instructions are located on the last page of this paperwork. If you have not heard from [place] regarding the results in 2 weeks, please contact this office.     As I am unable to improve your symptoms with treatment of asthma here tonight, and there are some possible changes in your EKG, recommend further evaluation through the emergency room.  I did check a blood clot tests, but that will not be back for a few hours. Emergency room can check other tests or evaluation as needed. Proceed they're directly after leaving our office, and if any change in your symptoms on the way there, pull over and call 911.   Nonspecific Chest Pain  Chest pain can be caused by many different conditions. There is always a chance that your pain could be related to something serious, such as a heart attack or a blood clot in your lungs. Chest pain can also be caused by conditions that are not life-threatening. If you have chest pain, it is very important to follow up with your health care provider. CAUSES  Chest pain can be caused by:  Heartburn.  Pneumonia or bronchitis.  Anxiety or stress.  Inflammation around your heart (pericarditis) or lung (pleuritis or pleurisy).  A blood clot in your lung.  A collapsed lung (pneumothorax). It can develop suddenly on its own (spontaneous  pneumothorax) or from trauma to the chest.  Shingles infection (varicella-zoster virus).  Heart attack.  Damage to the bones, muscles, and cartilage that make up your chest wall. This can include:  Bruised bones due to injury.  Strained muscles or cartilage due to frequent or repeated coughing or overwork.  Fracture to one or more ribs.  Sore cartilage due to inflammation (costochondritis). RISK FACTORS  Risk factors for chest pain may include:  Activities that increase your risk for trauma or injury to your chest.  Respiratory infections or conditions that cause frequent coughing.  Medical conditions or overeating that can cause heartburn.  Heart disease or family history of heart disease.  Conditions or health behaviors that increase your risk of developing a blood clot.  Having had chicken pox (varicella zoster). SIGNS AND SYMPTOMS Chest pain can feel like:  Burning or tingling on the surface of your chest or deep in your chest.  Crushing, pressure, aching, or squeezing pain.  Dull or sharp pain that is worse when you move, cough, or take a deep breath.  Pain that is also felt in your back, neck, shoulder, or arm, or pain that spreads to any of these areas. Your chest pain may come and go, or it may stay constant. DIAGNOSIS Lab tests or other studies may be needed to find the cause of your pain. Your health care provider may have you take a test called an ambulatory ECG (electrocardiogram). An ECG records your heartbeat patterns at the time the test is performed.  You may also have other tests, such as:  Transthoracic echocardiogram (TTE). During echocardiography, sound waves are used to create a picture of all of the heart structures and to look at how blood flows through your heart.  Transesophageal echocardiogram (TEE).This is a more advanced imaging test that obtains images from inside your body. It allows your health care provider to see your heart in finer  detail.  Cardiac monitoring. This allows your health care provider to monitor your heart rate and rhythm in real time.  Holter monitor. This is a portable device that records your heartbeat and can help to diagnose abnormal heartbeats. It allows your health care provider to track your heart activity for several days, if needed.  Stress tests. These can be done through exercise or by taking medicine that makes your heart beat more quickly.  Blood tests.  Imaging tests. TREATMENT  Your treatment depends on what is causing your chest pain. Treatment may include:  Medicines. These may include:  Acid blockers for heartburn.  Anti-inflammatory medicine.  Pain medicine for inflammatory conditions.  Antibiotic medicine, if an infection is present.  Medicines to dissolve blood clots.  Medicines to treat coronary artery disease.  Supportive care for conditions that do not require medicines. This may include:  Resting.  Applying heat or cold packs to injured areas.  Limiting activities until pain decreases. HOME CARE INSTRUCTIONS  If you were prescribed an antibiotic medicine, finish it all even if you start to feel better.  Avoid any activities that bring on chest pain.  Do not use any tobacco products, including cigarettes, chewing tobacco, or electronic cigarettes. If you need help quitting, ask your health care provider.  Do not drink alcohol.  Take medicines only as directed by your health care provider.  Keep all follow-up visits as directed by your health care provider. This is important. This includes any further testing if your chest pain does not go away.  If heartburn is the cause for your chest pain, you may be told to keep your head raised (elevated) while sleeping. This reduces the chance that acid will go from your stomach into your esophagus.  Make lifestyle changes as directed by your health care provider. These may include:  Getting regular exercise. Ask  your health care provider to suggest some activities that are safe for you.  Eating a heart-healthy diet. A registered dietitian can help you to learn healthy eating options.  Maintaining a healthy weight.  Managing diabetes, if necessary.  Reducing stress. SEEK MEDICAL CARE IF:  Your chest pain does not go away after treatment.  You have a rash with blisters on your chest.  You have a fever. SEEK IMMEDIATE MEDICAL CARE IF:   Your chest pain is worse.  You have an increasing cough, or you cough up blood.  You have severe abdominal pain.  You have severe weakness.  You faint.  You have chills.  You have sudden, unexplained chest discomfort.  You have sudden, unexplained discomfort in your arms, back, neck, or jaw.  You have shortness of breath at any time.  You suddenly start to sweat, or your skin gets clammy.  You feel nauseous or you vomit.  You suddenly feel light-headed or dizzy.  Your heart begins to beat quickly, or it feels like it is skipping beats. These symptoms may represent a serious problem that is an emergency. Do not wait to see if the symptoms will go away. Get medical help right away. Call your local emergency  services (911 in the [place] Do not drive yourself to the hospital.   This information is not intended to replace advice given to you by your health care provider. Make sure you discuss any questions you have with your health care provider.   Document Released: 04/26/2005 Document Revised: 08/07/2014 Document Reviewed: 02/20/2014 Elsevier Interactive Patient Education 2016 [employer]   Shortness of Breath Shortness of breath means you have trouble breathing. It could also mean that you have a medical problem. You should get immediate medical care for shortness of breath. CAUSES   Not enough oxygen in the air such as with high altitudes or a smoke-filled room.  Certain lung diseases, infections, or problems.  Heart disease or  conditions, such as angina or heart failure.  Low red blood cells (anemia).  Poor physical fitness, which can cause shortness of breath when you exercise.  Chest or back injuries or stiffness.  Being overweight.  Smoking.  Anxiety, which can make you feel like you are not getting enough air. DIAGNOSIS  Serious medical problems can often be found during your physical exam. Tests may also be done to determine why you are having shortness of breath. Tests may include:  Chest X-rays.  Lung function tests.  Blood tests.  An electrocardiogram (ECG).  An ambulatory electrocardiogram. An ambulatory ECG records your heartbeat patterns over a 24-hour period.  Exercise testing.  A transthoracic echocardiogram (TTE). During echocardiography, sound waves are used to evaluate how blood flows through your heart.  A transesophageal echocardiogram (TEE).  Imaging scans. Your health care provider may not be able to find a cause for your shortness of breath after your exam. In this case, it is important to have a follow-up exam with your health care provider as directed.  TREATMENT  Treatment for shortness of breath depends on the cause of your symptoms and can vary greatly. HOME CARE INSTRUCTIONS   Do not smoke. Smoking is a common cause of shortness of breath. If you smoke, ask for help to quit.  Avoid being around chemicals or things that may bother your breathing, such as paint fumes and dust.  Rest as needed. Slowly resume your usual activities.  If medicines were prescribed, take them as directed for the full length of time directed. This includes oxygen and any inhaled medicines.  Keep all follow-up appointments as directed by your health care provider. SEEK MEDICAL CARE IF:   Your condition does not improve in the time expected.  You have a hard time doing your normal activities even with rest.  You have any new symptoms. SEEK IMMEDIATE MEDICAL CARE IF:   Your shortness  of breath gets worse.  You feel light-headed, faint, or develop a cough not controlled with medicines.  You start coughing up blood.  You have pain with breathing.  You have chest pain or pain in your arms, shoulders, or abdomen.  You have a fever.  You are unable to walk up stairs or exercise the way you normally do. MAKE SURE YOU:  Understand these instructions.  Will watch your condition.  Will get help right away if you are not doing well or get worse.   This information is not intended to replace advice given to you by your health care provider. Make sure you discuss any questions you have with your health care provider.   Document Released: 04/11/2001 Document Revised: 07/22/2013 Document Reviewed: 10/02/2011 Elsevier Interactive Patient Education [employer]2016 Elsevier Inc.

## 2015-11-12 MED ORDER — ALBUTEROL SULFATE HFA 108 (90 BASE) MCG/ACT IN AERS: 1.0000 | INHALATION_SPRAY | Freq: Four times a day (QID) | RESPIRATORY_TRACT | Status: DC | PRN

## 2015-11-12 NOTE — Discharge Instructions (Signed)
Take the prescribed medication as directed. °Follow-up with your primary care physician. °Return to the ED for new or worsening symptoms. ° °

## 2015-12-09 ENCOUNTER — Ambulatory Visit (INDEPENDENT_AMBULATORY_CARE_PROVIDER_SITE_OTHER): Payer: Self-pay | Admitting: Physician Assistant

## 2015-12-09 VITALS — BP 110/72 | HR 70 | Temp 98.1°F | Resp 18 | Ht 65.0 in | Wt 139.0 lb

## 2015-12-09 DIAGNOSIS — R39198 Other difficulties with micturition: Secondary | ICD-10-CM

## 2015-12-09 DIAGNOSIS — R300 Dysuria: Secondary | ICD-10-CM

## 2015-12-09 DIAGNOSIS — N3001 Acute cystitis with hematuria: Secondary | ICD-10-CM

## 2015-12-09 LAB — POCT URINALYSIS DIP (MANUAL ENTRY)
Bilirubin, UA: NEGATIVE
Glucose, UA: NEGATIVE
Ketones, POC UA: NEGATIVE
Nitrite, UA: NEGATIVE
Protein Ur, POC: 30 — AB
Spec Grav, UA: 1.02
Urobilinogen, UA: 0.2
pH, UA: 7

## 2015-12-09 LAB — POC MICROSCOPIC URINALYSIS (UMFC)

## 2015-12-09 MED ORDER — SULFAMETHOXAZOLE-TRIMETHOPRIM 800-160 MG PO TABS: 1.0000 | ORAL_TABLET | Freq: Two times a day (BID) | ORAL | Status: AC

## 2015-12-09 NOTE — Progress Notes (Signed)
[facility] [address], [place] [address] [phone]  Date:  12/09/2015   Name:  Sandra Moody   DOB:  28-Dec-1970   MRN:  [id number]  PCP:  No primary care provider on file.    History of Present Illness:  Sandra Moody is a 45 y.o. female patient who [facility].  2 days ago, developed urinary pressure and a burning sensation.  When she does urinate, feels very little.  She has an urgent sensation.   She has no lower abdominal pain, nausea, fever, back pain.  No blood in urine.   Sexually active, unprotected with monogamous partner.  She has taken urostat which is not helping.  Drinks very little water.  No hx of kidney stones.    Patient Active Problem List   Diagnosis Date Noted  . Asthma with acute exacerbation 10/04/2015  . Other and unspecified ovarian cyst 06/17/2012  . Pelvic pain in female 06/17/2012  . Bacterial vaginosis 06/17/2012    Past Medical History  Diagnosis Date  . Asthma     Past Surgical History  Procedure Laterality Date  . Tubal ligation      Social History  Substance Use Topics  . Smoking status: Never Smoker   . Smokeless tobacco: Never Used  . Alcohol Use: Yes     Comment: occasional    Family History  Problem Relation Age of Onset  . Thyroid disease Mother   . Heart disease Mother     Allergies  Allergen Reactions  . Ginger Itching and Swelling  . Passion Fruit Flavor [Flavoring Agent] Itching and Swelling  . Peanuts [Peanut Oil] Anaphylaxis    PT ALLERGIC TO ALL TREE NUTS    Medication list has been reviewed and updated.  Current Outpatient Prescriptions on File Prior to Visit  Medication Sig Dispense Refill  . albuterol (PROVENTIL HFA;VENTOLIN HFA) 108 (90 Base) MCG/ACT inhaler Inhale 1-2 puffs into the lungs every 6 (six) hours as needed for wheezing. 1 Inhaler 0  . mometasone-formoterol (DULERA) 100-5 MCG/ACT AERO Inhale 2 puffs into the lungs daily. Reported on 12/09/2015     . verapamil (CALAN-SR) 240 MG CR tablet Take 240 mg by mouth 2 (two) times daily.    Marland Kitchen. VIMOVO 500-20 MG TBEC Take 1 tablet by mouth 2 (two) times daily. Reported on 12/09/2015  0   No current facility-administered medications on file prior to visit.    ROS ROS otherwise unremarkable unless listed above.   Physical Examination: BP 110/72 mmHg  Pulse 70  Temp(Src) 98.1 F (36.7 C) (Oral)  Resp 18  Ht 5\' 5"  (1.651 m)  Wt 139 lb (63.05 kg)  BMI 23.13 kg/m2  SpO2 99%  LMP 10/28/2015 Ideal Body Weight: Weight in (lb) to have BMI = 25: 149.9  Physical Exam  Constitutional: She is oriented to person, place, and time. She appears well-developed and well-nourished. No distress.  HENT:  Head: Normocephalic and atraumatic.  Right Ear: External ear normal.  Left Ear: External ear normal.  Eyes: Conjunctivae and EOM are normal. Pupils are equal, round, and reactive to light.  Cardiovascular: Normal rate and regular rhythm.  Exam reveals no friction rub.   No murmur heard. Pulmonary/Chest: Effort normal and breath sounds normal. No respiratory distress. She has no wheezes.  Abdominal: Soft. Normal appearance and bowel sounds are normal. There is no hepatosplenomegaly. There is tenderness in the suprapubic area. There is no CVA tenderness, no tenderness at  McBurney's point and negative Murphy's sign.  Neurological: She is alert and oriented to person, place, and time.  Skin: She is not diaphoretic.  Psychiatric: She has a normal mood and affect. Her behavior is normal.    Results for orders placed or performed in visit on 12/09/15  POCT Microscopic Urinalysis ([facility])  Result Value Ref Range   WBC,UR,HPF,POC Too numerous to count  (A) None WBC/hpf   RBC,UR,HPF,POC Moderate (A) None RBC/hpf   Bacteria Few (A) None, Too numerous to count   Mucus Present (A) Absent   Epithelial Cells, UR Per Microscopy Moderate (A) None, Too numerous to count cells/hpf   Sperm Present   POCT urinalysis  dipstick  Result Value Ref Range   Color, UA yellow yellow   Clarity, UA cloudy (A) clear   Glucose, UA negative negative   Bilirubin, UA negative negative   Ketones, POC UA negative negative   Spec Grav, UA 1.020    Blood, UA moderate (A) negative   pH, UA 7.0    Protein Ur, POC =30 (A) negative   Urobilinogen, UA 0.2    Nitrite, UA Negative Negative   Leukocytes, UA large (3+) (A) Negative     Assessment and Plan: Sandra Moody is a 45 y.o. female who is here today for cc of dysuria. Likely uti infection.  Captured gc/chl probe at this time, given sexual hx.  Starting bactrim at this time.   Decreased urine stream - Plan: POCT Microscopic Urinalysis ([facility]), POCT urinalysis dipstick  Dysuria - Plan: GC/Chlamydia Probe Amp  Acute cystitis with hematuria - Plan: sulfamethoxazole-trimethoprim (BACTRIM DS,SEPTRA DS) 800-160 MG tablet  Sandra Platt, PA-C [facility] 12/09/2015 10:33 AM

## 2015-12-09 NOTE — Patient Instructions (Addendum)
   IF you received an x-ray today, you will receive an invoice from [facility]. Please contact [facility] at [phone] with questions or concerns regarding your invoice.   IF you received labwork today, you will receive an invoice from [facility]. Please contact [facility] at [phone] with questions or concerns regarding your invoice.   Our billing staff will not be able to assist you with questions regarding bills from these companies.  You will be contacted with the lab results as soon as they are available. The fastest way to get your results is to activate your My Chart account. Instructions are located on the last page of this paperwork. If you have not heard from us regarding the results in 2 weeks, please contact this office.     Urinary Tract Infection Urinary tract infections (UTIs) can develop anywhere along your urinary tract. Your urinary tract is your body's drainage system for removing wastes and extra water. Your urinary tract includes two kidneys, two ureters, a bladder, and a urethra. Your kidneys are a pair of bean-shaped organs. Each kidney is about the size of your fist. They are located below your ribs, one on each side of your spine. CAUSES Infections are caused by microbes, which are microscopic organisms, including fungi, viruses, and bacteria. These organisms are so small that they can only be seen through a microscope. Bacteria are the microbes that most commonly cause UTIs. SYMPTOMS  Symptoms of UTIs may vary by age and gender of the patient and by the location of the infection. Symptoms in young women typically include a frequent and intense urge to urinate and a painful, burning feeling in the bladder or urethra during urination. Older women and men are more likely to be tired, shaky, and weak and have muscle aches and abdominal pain. A fever may mean the infection is in your kidneys. Other symptoms of a kidney  infection include pain in your back or sides below the ribs, nausea, and vomiting. DIAGNOSIS To diagnose a UTI, your caregiver will ask you about your symptoms. Your caregiver will also ask you to provide a urine sample. The urine sample will be tested for bacteria and white blood cells. White blood cells are made by your body to help fight infection. TREATMENT  Typically, UTIs can be treated with medication. Because most UTIs are caused by a bacterial infection, they usually can be treated with the use of antibiotics. The choice of antibiotic and length of treatment depend on your symptoms and the type of bacteria causing your infection. HOME CARE INSTRUCTIONS  If you were prescribed antibiotics, take them exactly as your caregiver instructs you. Finish the medication even if you feel better after you have only taken some of the medication.  Drink enough water and fluids to keep your urine clear or pale yellow.  Avoid caffeine, tea, and carbonated beverages. They tend to irritate your bladder.  Empty your bladder often. Avoid holding urine for long periods of time.  Empty your bladder before and after sexual intercourse.  After a bowel movement, women should cleanse from front to back. Use each tissue only once. SEEK MEDICAL CARE IF:   You have back pain.  You develop a fever.  Your symptoms do not begin to resolve within 3 days. SEEK IMMEDIATE MEDICAL CARE IF:   You have severe back pain or lower abdominal pain.  You develop chills.  You have nausea or vomiting.  You have continued burning or discomfort with urination.   MAKE SURE YOU:   Understand these instructions.  Will watch your condition.  Will get help right away if you are not doing well or get worse.   This information is not intended to replace advice given to you by your health care provider. Make sure you discuss any questions you have with your health care provider.   Document Released: 04/26/2005 Document  Revised: 04/07/2015 Document Reviewed: 08/25/2011 Elsevier Interactive Patient Education 2016 [employer]  

## 2015-12-10 LAB — GC/CHLAMYDIA PROBE AMP
CT Probe RNA: NOT DETECTED
GC Probe RNA: NOT DETECTED

## 2015-12-13 ENCOUNTER — Telehealth: Payer: Self-pay | Admitting: Physician Assistant

## 2015-12-13 NOTE — Telephone Encounter (Signed)
Advised patient that her gonorrhea chlamydia was negative.  She reports that her symptoms are improving, but she still has some urgency.  She has one more day of her abx.  She has an appt with pcp in 3 days.  She states that she will follow up with them if sxs do not improve.

## 2016-03-02 ENCOUNTER — Other Ambulatory Visit: Payer: Self-pay | Admitting: Obstetrics and Gynecology

## 2016-03-02 DIAGNOSIS — J45909 Unspecified asthma, uncomplicated: Secondary | ICD-10-CM | POA: Insufficient documentation

## 2016-03-03 LAB — CYTOLOGY - PAP

## 2016-04-04 ENCOUNTER — Encounter (HOSPITAL_COMMUNITY): Payer: Self-pay | Admitting: Family Medicine

## 2016-04-04 ENCOUNTER — Ambulatory Visit (HOSPITAL_COMMUNITY)
Admission: EM | Admit: 2016-04-04 | Discharge: 2016-04-04 | Disposition: A | Discharge status: Home / Self Care | Payer: BLUE CROSS/BLUE SHIELD | Attending: Family Medicine | Admitting: Family Medicine

## 2016-04-04 DIAGNOSIS — J0100 Acute maxillary sinusitis, unspecified: Secondary | ICD-10-CM

## 2016-04-04 MED ORDER — AMOXICILLIN-POT CLAVULANATE 875-125 MG PO TABS: 1.0000 | ORAL_TABLET | Freq: Two times a day (BID) | ORAL | 0 refills | Status: DC

## 2016-04-04 MED ORDER — HYDROCODONE-ACETAMINOPHEN 5-325 MG PO TABS: 1.0000 | ORAL_TABLET | Freq: Four times a day (QID) | ORAL | 0 refills | Status: DC | PRN

## 2016-04-04 NOTE — ED Triage Notes (Signed)
Pt here for ear pain, sinus pain and headache.

## 2016-04-04 NOTE — ED Provider Notes (Signed)
[facility]    CSN: [id number] Arrival date & time: 04/04/16  4098  First Provider Contact:  First MD Initiated Contact with Patient 04/04/16 1914        History   Chief Complaint No chief complaint on file.   HPI Sandra Moody is a 45 y.o. female.   Is a 45 year old woman who has had facial pain for about 10 days. She went to see her dentist who said she did not have a dental problem. She then went to her doctor who prescribed azithromycin for a presumed sinus infection. Instead of improving, she got worse and started developing ear pain, throbbing in nature, and bloody discharge from her left ear canal.  Patient's had no cough, stiff neck, sore throat, or fever  Patient works with hand assembly projects.      Past Medical History:  Diagnosis Date  . Asthma     Patient Active Problem List   Diagnosis Date Noted  . Asthma with acute exacerbation 10/04/2015  . Other and unspecified ovarian cyst 06/17/2012  . Pelvic pain in female 06/17/2012  . Bacterial vaginosis 06/17/2012    Past Surgical History:  Procedure Laterality Date  . TUBAL LIGATION      OB History    Gravida Para Term Preterm AB Living   5 3 3  0 2 3   SAB TAB Ectopic Multiple Live Births   1 1 0 0         Home Medications    Prior to Admission medications   Medication Sig Start Date End Date Taking? Authorizing Provider  albuterol (PROVENTIL HFA;VENTOLIN HFA) 108 (90 Base) MCG/ACT inhaler Inhale 1-2 puffs into the lungs every 6 (six) hours as needed for wheezing. 11/12/15   Garlon Hatchet, PA-C  amoxicillin-clavulanate (AUGMENTIN) 875-125 MG tablet Take 1 tablet by mouth 2 (two) times daily. Take with food 04/04/16   Elvina Sidle, MD  beclomethasone (QVAR) 40 MCG/ACT inhaler Inhale into the lungs 2 (two) times daily.    Historical Provider, MD  HYDROcodone-acetaminophen (NORCO) 5-325 MG tablet Take 1 tablet by mouth every 6 (six) hours as needed for moderate pain. 04/04/16   Elvina Sidle, MD  mometasone-formoterol [facility]) 100-5 MCG/ACT AERO Inhale 2 puffs into the lungs daily. Reported on 12/09/2015    Historical Provider, MD  montelukast (SINGULAIR) 10 MG tablet Take 10 mg by mouth at bedtime.    Historical Provider, MD  verapamil (CALAN-SR) 240 MG CR tablet Take 240 mg by mouth 2 (two) times daily. 09/17/15   Historical Provider, MD  VIMOVO 500-20 MG TBEC Take 1 tablet by mouth 2 (two) times daily. Reported on 12/09/2015 09/16/15   Historical Provider, MD    Family History Family History  Problem Relation Age of Onset  . Thyroid disease Mother   . Heart disease Mother     Social History Social History  Substance Use Topics  . Smoking status: Never Smoker  . Smokeless tobacco: Never Used  . Alcohol use Yes     Comment: occasional     Allergies   Ginger; Passion fruit flavor [flavoring agent]; and Peanuts [peanut oil]   Review of Systems Review of Systems  Constitutional: Negative.   HENT: Positive for congestion, facial swelling and sinus pressure. Negative for dental problem and trouble swallowing.   Eyes: Negative.   Respiratory: Negative.   Cardiovascular: Negative.      Physical Exam Triage Vital Signs ED Triage Vitals [04/04/16 1908]  Enc Vitals Group  BP 135/71     Pulse Rate 78     Resp      Temp 99.8 F (37.7 C)     Temp Source Oral     SpO2 100 %     Weight      Height      Head Circumference      Peak Flow      Pain Score      Pain Loc      Pain Edu?      Excl. in GC?    No data found.   Updated Vital Signs BP 135/71 (BP Location: Left Arm)   Pulse 78   Temp 99.8 F (37.7 C) (Oral)   SpO2 100%   Visual Acuity Right Eye Distance:   Left Eye Distance:   Bilateral Distance:    Right Eye Near:   Left Eye Near:    Bilateral Near:     Physical Exam  Constitutional: She appears well-developed and well-nourished.  HENT:  Head: Atraumatic.  Mouth/Throat: Oropharynx is clear and moist.  Patient has  abrasions on the end. There are abrasions on the inferior portion of both of her ear canals. Her left face is mildly swollen and somewhat tender. Nasal passages are swollen, particularly on left  Eyes: Conjunctivae and EOM are normal. Pupils are equal, round, and reactive to light.  Neck: Normal range of motion. Neck supple.  Pulmonary/Chest: Effort normal.  Skin: Skin is warm and dry.  Psychiatric: She has a normal mood and affect.  Nursing note and vitals reviewed.    UC Treatments / Results  Labs (all labs ordered are listed, but only abnormal results are displayed) Labs Reviewed - No data to display  EKG  EKG Interpretation None       Radiology No results found.  Procedures Procedures (including critical care time)  Medications Ordered in UC Medications - No data to display   Initial Impression / Assessment and Plan / UC Course  I have reviewed the triage vital signs and the nursing notes.  Pertinent labs & imaging results that were available during my care of the patient were reviewed by me and considered in my medical decision making (see chart for details).  Clinical Course     Final Clinical Impressions(s) / UC Diagnoses   Final diagnoses:  Acute maxillary sinusitis, recurrence not specified    New Prescriptions New Prescriptions   AMOXICILLIN-CLAVULANATE (AUGMENTIN) 875-125 MG TABLET    Take 1 tablet by mouth 2 (two) times daily. Take with food   HYDROCODONE-ACETAMINOPHEN (NORCO) 5-325 MG TABLET    Take 1 tablet by mouth every 6 (six) hours as needed for moderate pain.     Elvina SidleKurt Prentiss Polio, MD 04/04/16 [phone]

## 2016-05-15 ENCOUNTER — Ambulatory Visit (HOSPITAL_COMMUNITY)
Admission: EM | Admit: 2016-05-15 | Discharge: 2016-05-15 | Disposition: A | Discharge status: Home / Self Care | Payer: BLUE CROSS/BLUE SHIELD | Attending: Emergency Medicine | Admitting: Emergency Medicine

## 2016-05-15 ENCOUNTER — Encounter (HOSPITAL_COMMUNITY): Payer: Self-pay | Admitting: *Deleted

## 2016-05-15 DIAGNOSIS — R300 Dysuria: Secondary | ICD-10-CM | POA: Insufficient documentation

## 2016-05-15 DIAGNOSIS — M545 Low back pain, unspecified: Secondary | ICD-10-CM

## 2016-05-15 LAB — POCT URINALYSIS DIP (DEVICE)
Bilirubin Urine: NEGATIVE
Glucose, UA: NEGATIVE mg/dL
Ketones, ur: NEGATIVE mg/dL
Leukocytes, UA: NEGATIVE
Nitrite: NEGATIVE
Protein, ur: NEGATIVE mg/dL
Specific Gravity, Urine: 1.025 (ref 1.005–1.030)
Urobilinogen, UA: 0.2 mg/dL (ref 0.0–1.0)
pH: 6 (ref 5.0–8.0)

## 2016-05-15 MED ORDER — CEPHALEXIN 500 MG PO CAPS: 500.0000 mg | ORAL_CAPSULE | Freq: Four times a day (QID) | ORAL | 0 refills | Status: DC

## 2016-05-15 NOTE — ED Triage Notes (Signed)
Patient reports lower abdominal pain, lower back pain, dysuria, and pressure while urinating since Friday. Unsure of fever. Denies n/v/d.

## 2016-05-15 NOTE — ED Provider Notes (Signed)
CSN: [id number]     Arrival date & time 05/15/16  1805 History   First MD Initiated Contact with Patient 05/15/16 2031     Chief Complaint  Patient presents with  . Dysuria  . Back Pain   (Consider location/radiation/quality/duration/timing/severity/associated sxs/prior Treatment) 45 year old female complaining of left low back ache and nonradiating associated with pain after urinating in the suprapubic area and small volume voids for 3 days. Denies fever or chills although she has had some sweating at night. She believes she has a UTI.      Past Medical History:  Diagnosis Date  . Asthma    Past Surgical History:  Procedure Laterality Date  . TUBAL LIGATION     Family History  Problem Relation Age of Onset  . Thyroid disease Mother   . Heart disease Mother    Social History  Substance Use Topics  . Smoking status: Never Smoker  . Smokeless tobacco: Never Used  . Alcohol use Yes     Comment: occasional   OB History    Gravida Para Term Preterm AB Living   5 3 3  0 2 3   SAB TAB Ectopic Multiple Live Births   1 1 0 0       Review of Systems  Constitutional: Negative.  Negative for fatigue.  HENT: Negative.   Respiratory: Negative.   Gastrointestinal: Negative.   Genitourinary: Positive for decreased urine volume. Negative for frequency, pelvic pain, vaginal bleeding and vaginal discharge.  Neurological: Negative.   All other systems reviewed and are negative.   Allergies  Ginger; Passion fruit flavor [flavoring agent]; and Peanuts [peanut oil]  Home Medications   Prior to Admission medications   Medication Sig Start Date End Date Taking? Authorizing Provider  albuterol (PROVENTIL HFA;VENTOLIN HFA) 108 (90 Base) MCG/ACT inhaler Inhale 1-2 puffs into the lungs every 6 (six) hours as needed for wheezing. 11/12/15   Garlon Hatchet, PA-C  beclomethasone (QVAR) 40 MCG/ACT inhaler Inhale into the lungs 2 (two) times daily.    Historical Provider, MD  cephALEXin  (KEFLEX) 500 MG capsule Take 1 capsule (500 mg total) by mouth 4 (four) times daily. 05/15/16   Hayden Rasmussen, NP  mometasone-formoterol (DULERA) 100-5 MCG/ACT AERO Inhale 2 puffs into the lungs daily. Reported on 12/09/2015    Historical Provider, MD  montelukast (SINGULAIR) 10 MG tablet Take 10 mg by mouth at bedtime.    Historical Provider, MD  verapamil (CALAN-SR) 240 MG CR tablet Take 240 mg by mouth 2 (two) times daily. 09/17/15   Historical Provider, MD  VIMOVO 500-20 MG TBEC Take 1 tablet by mouth 2 (two) times daily. Reported on 12/09/2015 09/16/15   Historical Provider, MD   Meds Ordered and Administered this Visit  Medications - No data to display  BP 144/84 (BP Location: Left Arm)   Pulse 73   Temp 98.4 F (36.9 C) (Oral)   Resp 14   SpO2 100%  No data found.   Physical Exam  Constitutional: She is oriented to person, place, and time. She appears well-developed and well-nourished. No distress.  Eyes: EOM are normal.  Neck: Neck supple.  Cardiovascular: Normal rate.   Pulmonary/Chest: Effort normal. No respiratory distress.  Musculoskeletal: She exhibits no edema.  Direct tenderness over the left low back musculature at the site patient complaining of back pain.  Neurological: She is alert and oriented to person, place, and time. She exhibits normal muscle tone.  Skin: Skin is warm and dry.  Psychiatric: She  has a normal mood and affect.  Nursing note and vitals reviewed.   Urgent Care Course   Clinical Course    Procedures (including critical care time)  Labs Review Labs Reviewed  POCT URINALYSIS DIP (DEVICE) - Abnormal; Notable for the following:       Result Value   Hgb urine dipstick TRACE (*)    All other components within normal limits    Imaging Review No results found.   Visual Acuity Review  Right Eye Distance:   Left Eye Distance:   Bilateral Distance:    Right Eye Near:   Left Eye Near:    Bilateral Near:         MDM   1. Dysuria   2.  Acute left-sided low back pain without sciatica    Drink plenty of fluids. Take medication as directed. A culture of the urine will be obtained. Meds ordered this encounter  Medications  . cephALEXin (KEFLEX) 500 MG capsule    Sig: Take 1 capsule (500 mg total) by mouth 4 (four) times daily.    Dispense:  28 capsule    Refill:  0    Order Specific Question:   Supervising Provider    Answer:   Domenick GongMORTENSON, ASHLEY [[id number]]       Hayden Rasmussenavid Ivon Roedel, NP 05/15/16 2040

## 2016-05-15 NOTE — Discharge Instructions (Signed)
Drink plenty of fluids. Take medication as directed. A culture of the urine will be obtained.

## 2016-05-16 ENCOUNTER — Ambulatory Visit: Payer: Self-pay | Admitting: Allergy and Immunology

## 2016-05-17 ENCOUNTER — Telehealth (HOSPITAL_COMMUNITY): Payer: Self-pay | Admitting: Emergency Medicine

## 2016-05-17 LAB — URINE CULTURE

## 2016-05-17 NOTE — Telephone Encounter (Signed)
LM on [phone] Need to give lab results and to see how pt is doing from recent visit on 10/16 Notified pt in gen message that there is NO need to call back Unless not feeling any better, not tolerating meds well or if wanting to know lab results. Also let pt know labs can be obtained from MyChart

## 2016-05-17 NOTE — Telephone Encounter (Signed)
-----   Message from Eustace MooreLaura W Murray, MD sent at 05/17/2016  9:18 AM EDT ----- pleasel let patient know that urine culture did not clearly demonstrate a UTI.  Recheck or followup with PCP Dorothyann Pengobyn Sanders for further evaluation if symptoms persist.  LM

## 2016-05-18 ENCOUNTER — Encounter: Payer: Self-pay | Admitting: Allergy & Immunology

## 2016-05-18 ENCOUNTER — Ambulatory Visit (INDEPENDENT_AMBULATORY_CARE_PROVIDER_SITE_OTHER): Payer: BLUE CROSS/BLUE SHIELD | Admitting: Allergy & Immunology

## 2016-05-18 ENCOUNTER — Encounter (INDEPENDENT_AMBULATORY_CARE_PROVIDER_SITE_OTHER): Payer: Self-pay

## 2016-05-18 VITALS — BP 130/90 | HR 86 | Temp 99.2°F | Resp 18 | Ht 64.5 in | Wt 147.2 lb

## 2016-05-18 DIAGNOSIS — J4530 Mild persistent asthma, uncomplicated: Secondary | ICD-10-CM | POA: Diagnosis not present

## 2016-05-18 DIAGNOSIS — T781XXD Other adverse food reactions, not elsewhere classified, subsequent encounter: Secondary | ICD-10-CM

## 2016-05-18 DIAGNOSIS — J310 Chronic rhinitis: Secondary | ICD-10-CM | POA: Diagnosis not present

## 2016-05-18 MED ORDER — OLOPATADINE HCL 0.6 % NA SOLN: NASAL | 3 refills | Status: DC

## 2016-05-18 MED ORDER — EPINEPHRINE 0.3 MG/0.3ML IJ SOAJ: 0.3000 mg | Freq: Once | INTRAMUSCULAR | 2 refills | Status: AC

## 2016-05-18 NOTE — Progress Notes (Signed)
NEW PATIENT  Date of Service/Encounter:  05/18/16   Assessment:   Mild persistent asthma, uncomplicated - Plan: Spirometry with Graph  Other chronic rhinitis  Adverse food reaction, subsequent encounter - Plan: Allergy Test, Interdermal Allergy Test, Allergy-Shellfish Panel, Allergen, [place] Nut, f18, Allergen, [place] English, IgE, Allergy Panel 18, Nut Mix Group   Asthma Reportables:  Severity: mild persistent  Risk: low Control: not well controlled  Seasonal Influenza Vaccine: refused     Plan/Recommendations:    1. Mild persistent asthma, uncomplicated - Continue your Qvar 80 two puffs in the morning and 2 puffs at night. - Use a spacer every time. - Restart Singulair 10 mg once daily. - Lung testing was normal today but you did improve yes he was slightly following albuterol treatment.   - We will consider changes to your asthma medications at the next visit once I evaluate you after being on Singulair daily in addition to the Qvar.  - I do not want to add extra medicine if it is not necessarily needed. - You can use Pro Air 4 puffs every 4-6 hours as needed.  2. Chronic rhinitis - Testing today showed: positives to grass, weeds, trees, molds, cat, dust mite - Continue with Flonase but increased to 2 sprays per nostril once daily. - Add Patanase one to 2 sprays per nostril 1-2 times daily. - Use an over-the-counter antihistamine such as cetirizine or Allegra as needed for breakthrough symptoms. - Consider allergy shots if no improvement. - Avoidance measures provided.  3. Adverse food reaction (tree nuts, seafood) - Testing today showed: positive only to salmon - We will get blood work to make sure these foods are negative (tree nuts, seafood). - In the meantime, avoid tree nuts and seafood.  - EpiPen refilled.  4. Return in about 3 months (around 08/18/2016).   Subjective:   Sandra Moody is a 45 y.o. female presenting today for evaluation of  Chief  Complaint  Patient presents with  . Asthma  . Allergic Rhinitis   .  Sandra Moody has a history of the following: Patient Active Problem List   Diagnosis Date Noted  . Asthma with acute exacerbation 10/04/2015  . Other and unspecified ovarian cyst 06/17/2012  . Pelvic pain in female 06/17/2012  . Bacterial vaginosis 06/17/2012    History obtained from: chart review and patient.  Sandra Moody was referred by Maximino Greenland, MD.     Shali is a 45 y.o. female presenting for asthma and allergy evaluation.  specifically, she is concerned with tree nuts, shellfish, and environmental allergens. She also has a history of asthma. She reports that she has had these problems for her "entire life".  Asthma/Respiratory Symptom History: She has had problems for her entire life. She is on Qvar 80 two puffs BID and Singulair. She does not use a spacer. She is out of Singulair and has not taken it in two months because she ran out of refills. She does feel worse since being off of her Singulair and thinks that it didn't help. She has albuterol inhalers as well as nebulizers which she uses as needed. She estimates that she uses them 2-3 times over the last month. However, she says she tries to "tough it out". It is quite difficult to get a history. Her husband who accompanies her today and reports that she "enjoys being sick". She estimates that she gets prednisone around 2 times per calendar year. She also has a history of sinus  infections and respiratory infections "every so often". She is adamantly refusing flu shot today.  Allergic Rhinitis Symptom History: She has symptoms throughout the year, including nasal congestion, watery eyes. She uses Flonase every day. She does not take antihistamines regularly. Cats or a particularly bad trigger. She has been allergy tested in the past at a different level allergy practice. She was on immunotherapy for less than one year, but did not feel that it worked.    Food Allergy Symptom History: She has a history of fish (shellfish results in lip swelling and oral itching) and tree nuts (sour taste in her mouth, hair in throat, bumps in mouth). She avoids all seafood. She does eat peanuts.   Otherwise, there is no history of other atopic diseases, including drug allergies, stinging insect allergies, or urticaria. There is no significant infectious history aside from the recurrent sinusitis. Vaccinations are up to date.    Past Medical History: Patient Active Problem List   Diagnosis Date Noted  . Asthma with acute exacerbation 10/04/2015  . Other and unspecified ovarian cyst 06/17/2012  . Pelvic pain in female 06/17/2012  . Bacterial vaginosis 06/17/2012    Medication List:    Medication List       Accurate as of 05/18/16  1:30 PM. Always use your most recent med list.          albuterol 108 (90 Base) MCG/ACT inhaler Commonly known as:  PROVENTIL HFA;VENTOLIN HFA Inhale 1-2 puffs into the lungs every 6 (six) hours as needed for wheezing.   albuterol (2.5 MG/3ML) 0.083% nebulizer solution Commonly known as:  PROVENTIL Inhale 2.5 mg into the lungs as needed.   beclomethasone 40 MCG/ACT inhaler Commonly known as:  QVAR Inhale into the lungs 2 (two) times daily.   cephALEXin 500 MG capsule Commonly known as:  KEFLEX Take 1 capsule (500 mg total) by mouth 4 (four) times daily.   DULERA 100-5 MCG/ACT Aero Generic drug:  mometasone-formoterol Inhale 2 puffs into the lungs daily. Reported on 12/09/2015   EPINEPHrine 0.3 mg/0.3 mL Soaj injection Commonly known as:  EPIPEN 2-PAK Inject 0.3 mLs (0.3 mg total) into the muscle once.   montelukast 10 MG tablet Commonly known as:  SINGULAIR Take 10 mg by mouth at bedtime.   verapamil 240 MG CR tablet Commonly known as:  CALAN-SR Take 240 mg by mouth 2 (two) times daily.   VIMOVO 500-20 MG Tbec Generic drug:  Naproxen-Esomeprazole Take 1 tablet by mouth 2 (two) times daily. Reported  on 12/09/2015       Birth History: non-contributory. Born at term without complications.   Developmental History: Sandra Moody has met all milestones on time. She has required no speech therapy, occupational therapy, or physical therapy.   Past Surgical History: Past Surgical History:  Procedure Laterality Date  . TUBAL LIGATION       Family History: Family History  Problem Relation Age of Onset  . Thyroid disease Mother   . Heart disease Mother      Social History: Kinesha lives at home [place] husband. They live in a house. There is reduced throughout. She has electric heating and window unit air-conditioning. There are 4 dogs at home. There are no roach or mass issues. They do not use dust mite covers on the better pillows. There is no tobacco smoke exposure. 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   Review of Systems: a 14-point  review of systems is pertinent for what is mentioned in HPI.  Otherwise, all other systems were negative. Constitutional: negative other than that listed in the HPI Eyes: negative other than that listed in the HPI Ears, nose, mouth, throat, and face: negative other than that listed in the HPI Respiratory: negative other than that listed in the HPI Cardiovascular: negative other than that listed in the HPI Gastrointestinal: negative other than that listed in the HPI Genitourinary: positive for recently diagnosed urinary tract infection (has not started antibiotics), otherwise negative other than that listed in the HPI Integument: negative other than that listed in the HPI Hematologic: negative other than that listed in the HPI Musculoskeletal: negative other than that listed in the HPI Neurological: negative other than that listed in the HPI Allergy/Immunologic: negative other than that listed in the HPI    Objective:   Blood pressure 130/90, pulse 86, temperature 99.2 F  (37.3 C), temperature source Oral, resp. rate 18, height 5' 4.5" (1.638 m), weight 147 lb 3.2 oz (66.8 kg), SpO2 96 %. Body mass index is 24.88 kg/m.   Physical Exam:  General: Alert, interactive, in no acute distress. Cooperative with the exam.  HEENT: TMs pearly gray, turbinates markedly edematous and pale with clear discharge, post-pharynx markedly erythematous. Neck: Supple without thyromegaly. Adenopathy: no enlarged lymph nodes appreciated in the anterior cervical, occipital, axillary, epitrochlear, inguinal, or popliteal regions Lungs: Clear to auscultation without wheezing, rhonchi or rales. No increased work of breathing. CV: Normal S1/S2, no murmurs. Capillary refill <2 seconds.  Abdomen: Nondistended, nontender. No guarding or rebound tenderness. Bowel sounds faint and present in all fields  Skin: Warm and dry, without lesions or rashes. Extremities:  No clubbing, cyanosis or edema. Neuro:   Grossly intact. No focal deficits.   Diagnostic studies:  Spirometry: results normal (FEV1: 2.29/95%, FVC: 2.58/90%, FEV1/FVC: 88%).    Spirometry consistent with normal pattern. DuoNeb nebulizer treatment given in clinic with improvement, although it did not meet ATS criteria. With her normal prebronchodilator numbers, would be difficult to show significant improvement. Her FEV1 increased by percent and her FVC increased 6%   Allergy Studies:   Indoor/Outdoor Percutaneous Adult Environmental Panel: positive to grasses, weeds, trees, and equivocal to dust mites.  Indoor/Outdoor Selected Intradermal Environmental Panel: positive to mold mix 1, mold mix 4, cat, and dust mite. Negative to dog, cockroach, mold mix 2, and mold mix 3.   Selected Foods Panel: positive to salmon negative to shellfish mix, fish mix, tree nuts, shellfish, and fin fish     Salvatore Marvel, MD [facility]

## 2016-05-18 NOTE — Patient Instructions (Addendum)
1. Mild persistent asthma, uncomplicated - Continue his Qvar 80 two puffs in the morning and 2 puffs at night. - Use a spacer every time. - Restart Singulair 10 mg once daily. - Lung testing was normal today but you did improve following albuterol treatment.   - We will consider changes to your asthma medications at the next visit once I evaluate you after being on Singulair daily.  - I do not want to add extra medicine if it is not necessarily needed. - You can use Pro Air 4 puffs every 4-6 hours as needed.  2. Chronic rhinitis - Testing today showed: positives to grass, weeds, trees, molds, cat, dust mite - Continue with Flonase but increased to 2 sprays per nostril once daily. - Add Patanase one to 2 sprays per nostril 1-2 times daily. - Use an over-the-counter antihistamine such as cetirizine or Allegra as needed for breakthrough symptoms. - Consider allergy shots if no improvement.  3. Adverse food reaction (tree nuts, seafood) - Testing today showed: positive only to salmon - We will get blood work to make sure these foods are negative. - In the meantime, avoid tree nuts and seafood.  - EpiPen refilled.  4. Return in about 3 months (around 08/18/2016).  Please inform us of any Emergency Department visits, hospitalizations, or changes in symptoms. Call us before going to the ED for breathing or allergy symptoms since we might be able to fit you in for a sick visit. Feel free to contact us anytime with any questions, problems, or concerns.  It was a pleasure to meet you and your family today!   Websites that have reliable patient information: 1. American Academy of Asthma, Allergy, and Immunology: www.aaaai.org 2. Food Allergy Research and Education (FARE): foodallergy.org 3. Mothers of Asthmatics: http://www.asthmacommunitynetwork.org 4. American College of Allergy, Asthma, and Immunology: www.acaai.org  Reducing Pollen Exposure  The American Academy of Allergy, Asthma and  Immunology suggests the following steps to reduce your exposure to pollen during allergy seasons.    1. Do not hang sheets or clothing out to dry; pollen may collect on these items. 2. Do not mow lawns or spend time around freshly cut grass; mowing stirs up pollen. 3. Keep windows closed at night.  Keep car windows closed while driving. 4. Minimize morning activities outdoors, a time when pollen counts are usually at their highest. 5. Stay indoors as much as possible when pollen counts or humidity is high and on windy days when pollen tends to remain in the air longer. 6. Use air conditioning when possible.  Many air conditioners have filters that trap the pollen spores. Use a HEPA room air filter to remove pollen form the indoor air you breathe.  Control of Mold Allergen  Mold and fungi can grow on a variety of surfaces provided certain temperature and moisture conditions exist.  Outdoor molds grow on plants, decaying vegetation and soil.  The major outdoor mold, Alternaria and Cladosporium, are found in very high numbers during hot and dry conditions.  Generally, a late Summer - Fall peak is seen for common outdoor fungal spores.  Rain will temporarily lower outdoor mold spore count, but counts rise rapidly when the rainy period ends.  The most important indoor molds are Aspergillus and Penicillium.  Dark, humid and poorly ventilated basements are ideal sites for mold growth.  The next most common sites of mold growth are the bathroom and the kitchen.  Outdoor [employer] 1. Use air conditioning and keep windows closed  2. Avoid exposure to decaying vegetation. 3. Avoid leaf raking. 4. Avoid grain handling. 5. Consider wearing a face mask if working in moldy areas.  Indoor Mold Control 1. Maintain humidity below 50%. 2. Clean washable surfaces with 5% bleach solution. 3. Remove sources e.g. contaminated carpets.  Control of Dog or Cat Allergen  Avoidance is the best way to manage a dog  or cat allergy. If you have a dog or cat and are allergic to dog or cats, consider removing the dog or cat from the home. If you have a dog or cat but don't want to find it a new home, or if your family wants a pet even though someone in the household is allergic, here are some strategies that may help keep symptoms at bay:  1. Keep the pet out of your bedroom and restrict it to only a few rooms. Be advised that keeping the dog or cat in only one room will not limit the allergens to that room. 2. Don't pet, hug or kiss the dog or cat; if you do, wash your hands with soap and water. 3. High-efficiency particulate air (HEPA) cleaners run continuously in a bedroom or living room can reduce allergen levels over time. 4. Regular use of a high-efficiency vacuum cleaner or a central vacuum can reduce allergen levels. 5. Giving your dog or cat a bath at least once a week can reduce airborne allergen.  Control of House Dust Mite Allergen    House dust mites play a major role in allergic asthma and rhinitis.  They occur in environments with high humidity wherever human skin, the food for dust mites is found. High levels have been detected in dust obtained from mattresses, pillows, carpets, upholstered furniture, bed covers, clothes and soft toys.  The principal allergen of the house dust mite is found in its feces.  A gram of dust may contain 1,000 mites and 250,000 fecal particles.  Mite antigen is easily measured in the air during house cleaning activities.    1. Encase mattresses, including the box spring, and pillow, in an air tight cover.  Seal the zipper end of the encased mattresses with wide adhesive tape. 2. Wash the bedding in water of 130 degrees Farenheit weekly.  Avoid cotton comforters/quilts and flannel bedding: the most ideal bed covering is the dacron comforter. 3. Remove all upholstered furniture from the bedroom. 4. Remove carpets, carpet padding, rugs, and non-washable window drapes from  the bedroom.  Wash drapes weekly or use plastic window coverings. 5. Remove all non-washable stuffed toys from the bedroom.  Wash stuffed toys weekly. 6. Have the room cleaned frequently with a vacuum cleaner and a damp dust-mop.  The patient should not be in a room which is being cleaned and should wait 1 hour after cleaning before going into the room. 7. Close and seal all heating outlets in the bedroom.  Otherwise, the room will become filled with dust-laden air.  An electric heater can be used to heat the room. 8. Reduce indoor humidity to less than 50%.  Do not use a humidifier.

## 2016-08-24 ENCOUNTER — Encounter (INDEPENDENT_AMBULATORY_CARE_PROVIDER_SITE_OTHER): Payer: Self-pay

## 2016-08-24 ENCOUNTER — Encounter: Payer: Self-pay | Admitting: Allergy & Immunology

## 2016-08-24 ENCOUNTER — Ambulatory Visit (INDEPENDENT_AMBULATORY_CARE_PROVIDER_SITE_OTHER): Payer: Managed Care, Other (non HMO) | Admitting: Allergy & Immunology

## 2016-08-24 VITALS — BP 150/98 | Temp 98.3°F | Resp 16 | Ht 64.0 in | Wt 150.2 lb

## 2016-08-24 DIAGNOSIS — I10 Essential (primary) hypertension: Secondary | ICD-10-CM

## 2016-08-24 DIAGNOSIS — J4540 Moderate persistent asthma, uncomplicated: Secondary | ICD-10-CM

## 2016-08-24 DIAGNOSIS — J3089 Other allergic rhinitis: Secondary | ICD-10-CM | POA: Diagnosis not present

## 2016-08-24 MED ORDER — LISINOPRIL 10 MG PO TABS: 10.0000 mg | ORAL_TABLET | Freq: Every day | ORAL | 1 refills | Status: DC

## 2016-08-24 NOTE — Patient Instructions (Addendum)
1. Moderate persistent asthma without complication - Lung testing was normal today. - We will change you to Symbicort 80/4.5 which contains in inhaled steroid and long-acting form of albuterol.  - Use the Symbicort in place of the Qvar. - Daily controller medication(s): Symbicort 80/4.5 two puffs twice daily with spacer - Rescue medications: ProAir 4 puffs every 4-6 hours as needed - Asthma control goals:  * Full participation in all desired activities (may need albuterol before activity) * Albuterol use two time or less a week on average (not counting use with activity) * Cough interfering with sleep two time or less a month * Oral steroids no more than once a year * No hospitalizations  2. Perennial allergic rhinitis - Continue with the nasal steroid two sprays per nostril. - Continue with cetirizine as needed.  3. Adverse food reactions - It seems that you know of all of the foods that cause your reactions. - We can work this up further if you are interested.   4. Essential hypertension - I sent in a prescription for lisinopril 10mg  one tablet daily. - Your PCP can increase this if needed.  5. Return in about 4 weeks (around 09/21/2016).  Please inform us of any Emergency Department visits, hospitalizations, or changes in symptoms. Call us before going to the ED for breathing or allergy symptoms since we might be able to fit you in for a sick visit. Feel free to contact us anytime with any questions, problems, or concerns.  It was a pleasure to see you and your family again today! Best wishes in the [place]!   Websites that have reliable patient information: 1. American Academy of Asthma, Allergy, and Immunology: www.aaaai.org 2. Food Allergy Research and Education (FARE): foodallergy.org 3. Mothers of Asthmatics: http://www.asthmacommunitynetwork.org 4. American College of Allergy, Asthma, and Immunology: www.acaai.org

## 2016-08-24 NOTE — Progress Notes (Signed)
FOLLOW UP  Date of Service/Encounter:  08/25/16   Assessment:   Moderate persistent asthma without complication  Essential hypertension  Perennial allergic rhinitis  Adverse food reactions   Asthma Reportables:  Severity: moderate persistent  Risk: low Control: well controlled  Seasonal Influenza Vaccine: yes    Plan/Recommendations:   1. Moderate persistent asthma without complication - Lung testing was normal today. - We will change you to Symbicort 80/4.5 which contains in inhaled steroid and long-acting form of albuterol.  - Use the Symbicort in place of the Qvar. - Daily controller medication(s): Symbicort 80/4.5 two puffs twice daily with spacer - Rescue medications: ProAir 4 puffs every 4-6 hours as needed - Asthma control goals:  * Full participation in all desired activities (may need albuterol before activity) * Albuterol use two time or less a week on average (not counting use with activity) * Cough interfering with sleep two time or less a month * Oral steroids no more than once a year * No hospitalizations  2. Perennial allergic rhinitis - Continue with the nasal steroid two sprays per nostril. - Continue with cetirizine as needed. - She would make an excellent candidate for immunotherapy, but right now she is well controlled with the current medications,  3. Adverse food reactions - combination of intolerances and allergies - Ms. Katina DegreeFryar did not get her testing done at the last visit, but she seems to have a good grasp of what she can tolerate and not tolerate. - She does have an EpiPen if needed for acutely worsening symptoms.  - It seems that you know of all of the foods that cause your reactions. - We can work this up further if she is interested.   4. Essential hypertension - I sent in a prescription for lisinopril 10mg  one tablet daily. - Her PCP can increase this if needed.  5. Return in about 4 weeks (around 09/21/2016).   Subjective:    Sandra Moody is a 46 y.o. female presenting today for follow up of  Chief Complaint  Patient presents with  . Asthma    Follow up    Sandra Moody has a history of the following: Patient Active Problem List   Diagnosis Date Noted  . Asthma with acute exacerbation 10/04/2015  . Other and unspecified ovarian cyst 06/17/2012  . Pelvic pain in female 06/17/2012  . Bacterial vaginosis 06/17/2012    History obtained from: chart review and patient.  Sandra Russelalana M Gilcrest was referred by Gwynneth Alimentobyn N Sanders, MD.     Sandra Moody is a 46 y.o. female presenting for a follow up visit. She was last seen in October 2017. At that time, we continued Qvar 80 two puffs in the morning and 2 puffs at night. We also restarted Singulair. She had testing at that time was positive to grasses, weeds, trees, molds, cat, and dust mite. We continued Flonase 2 sprays per nostril daily and added Patanase. She did have food testing that was positive only to salmon. We obtained labs to confirm that tree nuts and seafood were negative. It does not appear that these were ever collected.  Since the last visit, she has mostly done well. Serra's asthma has been well controlled. She has not required rescue medication nor have activities of daily living been limited. She does report nighttime coughing around 1-2 times per week that has been consistent since the last visit. She does not have a history of reflux and denies symptoms of heartburn. Allergic rhinitis  symptoms have been well controlled, although she never picked up the nasal antihistamine because it was over $100. She is on the Flonase which seems to be working well. She uses an antihistamine as needed.  She was noted to have an elevated blood pressure today. She has been noted to have an elevated blood pressure on multiple occasions but has never started any antihypertensives. Review of her mediations shows that she was on verapamil around one year ago. Further review with the  patient reveals that she was on this following foot surgery for improving blood flow.   Otherwise, there have been no changes to her past medical history, surgical history, family history, or social history.    Review of Systems: a 14-point review of systems is pertinent for what is mentioned in HPI.  Otherwise, all other systems were negative. Constitutional: negative other than that listed in the HPI Eyes: negative other than that listed in the HPI Ears, nose, mouth, throat, and face: negative other than that listed in the HPI Respiratory: negative other than that listed in the HPI Cardiovascular: negative other than that listed in the HPI Gastrointestinal: negative other than that listed in the HPI Genitourinary: negative other than that listed in the HPI Integument: negative other than that listed in the HPI Hematologic: negative other than that listed in the HPI Musculoskeletal: negative other than that listed in the HPI Neurological: negative other than that listed in the HPI Allergy/Immunologic: negative other than that listed in the HPI    Objective:   Blood pressure (!) 150/98, temperature 98.3 F (36.8 C), temperature source Oral, resp. rate 16, height 5\' 4"  (1.626 m), weight 150 lb 3.2 oz (68.1 kg). Body mass index is 25.78 kg/m.   Physical Exam:  General: Alert, interactive, in no acute distress. Somewhat sassy. Very amusing.  Eyes: No conjunctival injection present on the right, No conjunctival injection present on the left, PERRL bilaterally, No discharge on the right, No discharge on the left and No Horner-Trantas dots present Ears: Right TM pearly gray with normal light reflex, Left TM pearly gray with normal light reflex, Right TM intact without perforation and Left TM intact without perforation.  Nose/Throat: External nose within normal limits and septum midline, turbinates edematous with clear discharge, post-pharynx erythematous with cobblestoning in the  posterior oropharynx. Tonsils 2+ without exudates Neck: Supple without thyromegaly. Lungs: Clear to auscultation without wheezing, rhonchi or rales. No increased work of breathing. CV: Normal S1/S2, no murmurs. Capillary refill <2 seconds.  Skin: Warm and dry, without lesions or rashes. Neuro:   Grossly intact. No focal deficits appreciated. Responsive to questions.   Diagnostic studies:  Spirometry: results normal (FEV1: 2.%, FVC: 2.31/98%, FEV1/FVC: 2.62/93%).    Spirometry consistent with normal pattern.  Allergy Studies: none     Malachi Bonds, MD [facility]

## 2016-08-25 ENCOUNTER — Ambulatory Visit (INDEPENDENT_AMBULATORY_CARE_PROVIDER_SITE_OTHER): Payer: Managed Care, Other (non HMO) | Admitting: Physician Assistant

## 2016-08-25 VITALS — BP 126/88 | HR 74 | Temp 98.2°F | Resp 16 | Ht 64.0 in | Wt 150.0 lb

## 2016-08-25 DIAGNOSIS — R0602 Shortness of breath: Secondary | ICD-10-CM | POA: Diagnosis not present

## 2016-08-25 DIAGNOSIS — R030 Elevated blood-pressure reading, without diagnosis of hypertension: Secondary | ICD-10-CM

## 2016-08-25 DIAGNOSIS — R002 Palpitations: Secondary | ICD-10-CM

## 2016-08-25 NOTE — Progress Notes (Signed)
[facility] [address], [place] [address] [phone]  Date:  08/25/2016   Name:  Sandra Moody   DOB:  1970-08-25   MRN:  [id number]  PCP:  Maximino Greenland, MD    History of Present Illness:  Sandra Moody is a 46 y.o. female patient who presents to [facility] for cc of palpitations and htn.  She reports that her allergist recommended that she be seen for elevated bp.   She was seen 1 day ago by her pulmonologist where her bp was 150/98.  Several days prior, she was seen by her gastroenterolgist who stated that her systolic was between 191-478 both times checked.  She does not recall the bottom number.      she was placed in lisinopril and advised to follow up with pcp, Glendale Chard, but she was not able to obtain an appt.   She reports hx of palpitations.  Rising with dizziness, and silver spots in eyes.  Sob may also be present where she feels like a brick is laying on her chest.  Vision may be blurry at times.  Not associated with exertion.  Deep breaths make her dizzy very easily.  Normal menses w/o hx of anemia.  . Denies increased stressors.   --food intake: eats chicken, potatoes, and apple sauce.  She will eat salads.  Very minimal array of foods secondary to her reflux. --coffee intake is 4-5 cups per day.  Tea--gallon per week.   --rare sodas.  --diabetes and asthma in family.     Wt Readings from Last 3 Encounters:  08/25/16 150 lb (68 kg)  08/24/16 150 lb 3.2 oz (68.1 kg)  05/18/16 147 lb 3.2 oz (66.8 kg)   Patient Active Problem List   Diagnosis Date Noted  . Asthma with acute exacerbation 10/04/2015  . Other and unspecified ovarian cyst 06/17/2012  . Pelvic pain in female 06/17/2012  . Bacterial vaginosis 06/17/2012    Past Medical History:  Diagnosis Date  . Asthma     Past Surgical History:  Procedure Laterality Date  . TUBAL LIGATION      Social History  Substance Use Topics  . Smoking status: Never Smoker  . Smokeless  tobacco: Never Used  . Alcohol use Yes     Comment: occasional    Family History  Problem Relation Age of Onset  . Thyroid disease Mother   . Heart disease Mother     Allergies  Allergen Reactions  . Ginger Itching and Swelling  . Passion Fruit Flavor [Flavoring Agent] Itching and Swelling  . Peanuts [Peanut Oil] Anaphylaxis    PT ALLERGIC TO ALL TREE NUTS    Medication list has been reviewed and updated.  Current Outpatient Prescriptions on File Prior to Visit  Medication Sig Dispense Refill  . albuterol (PROVENTIL HFA;VENTOLIN HFA) 108 (90 Base) MCG/ACT inhaler Inhale 1-2 puffs into the lungs every 6 (six) hours as needed for wheezing. 1 Inhaler 0  . albuterol (PROVENTIL) (2.5 MG/3ML) 0.083% nebulizer solution Inhale 2.5 mg into the lungs as needed.  5  . benzonatate (TESSALON) 100 MG capsule Take 100 mg by mouth as needed.  1  . EPIPEN 2-PAK 0.3 MG/0.3ML SOAJ injection Inject 0.3 mg into the muscle as directed.  2  . lisinopril (PRINIVIL,ZESTRIL) 10 MG tablet Take 1 tablet (10 mg total) by mouth daily. 30 tablet 1  . VIMOVO 500-20 MG TBEC Take 1 tablet by mouth 2 (two) times daily. Reported  on 12/09/2015  0  . VIORELE 0.15-0.02/0.01 MG (21/5) tablet Take 0.15 mg by mouth daily.  3   No current facility-administered medications on file prior to visit.     ROS ROS otherwise unremarkable unless listed above.   Physical Examination: BP 126/88 (BP Location: Left Arm, Patient Position: Sitting, Cuff Size: Small)   Pulse 74   Temp 98.2 F (36.8 C) (Oral)   Resp 16   Ht 5' 4"  (1.626 m)   Wt 150 lb (68 kg)   BMI 25.75 kg/m  Ideal Body Weight: Weight in (lb) to have BMI = 25: 145.3  Physical Exam  Constitutional: She is oriented to person, place, and time. She appears well-developed and well-nourished. No distress.  HENT:  Head: Normocephalic and atraumatic.  Right Ear: External ear normal.  Left Ear: External ear normal.  Eyes: Conjunctivae and EOM are normal. Pupils  are equal, round, and reactive to light.  Cardiovascular: Normal rate and regular rhythm.  Exam reveals no friction rub.   No murmur heard. Pulmonary/Chest: Effort normal and breath sounds normal. No respiratory distress. She has no wheezes.  Neurological: She is alert and oriented to person, place, and time.  Skin: Skin is warm and dry. She is not diaphoretic.  Psychiatric: She has a normal mood and affect. Her behavior is normal.     Assessment and Plan: Sandra Moody is a 46 y.o. female who is here today for cc of palpitations and concern of htn. ekg unremarkable.  Not the best historian, but I think it may be best that she have consult with cardiology.  She has multiple visits to the ed due to cp, and sob has been equated to asthma.  I would like to insure this.  She would like to follow up with [facility] of [place] as pcp. Elevated BP without diagnosis of hypertension - Plan: EKG 12-Lead, TSH, CMP14+EGFR  Ivar Drape, PA-C [facility] 1/28/20187:13 PM

## 2016-08-25 NOTE — Patient Instructions (Addendum)
Continue the lisinopril.  Await contact for the cardiology consult. I would like you to lower your caffeine intake. Please review this dash diet below.   Check your blood pressure twice daily. DASH Eating Plan DASH stands for "Dietary Approaches to Stop Hypertension." The DASH eating plan is a healthy eating plan that has been shown to reduce high blood pressure (hypertension). Additional health benefits may include reducing the risk of type 2 diabetes mellitus, heart disease, and stroke. The DASH eating plan may also help with weight loss. What do I need to know about the DASH eating plan? For the DASH eating plan, you will follow these general guidelines:  Choose foods with less than 150 milligrams of sodium per serving (as listed on the food label).  Use salt-free seasonings or herbs instead of table salt or sea salt.  Check with your health care provider or pharmacist before using salt substitutes.  Eat lower-sodium products. These are often labeled as "low-sodium" or "no salt added."  Eat fresh foods. Avoid eating a lot of canned foods.  Eat more vegetables, fruits, and low-fat dairy products.  Choose whole grains. Look for the word "whole" as the first word in the ingredient list.  Choose fish and skinless chicken or [place] more often than red meat. Limit fish, poultry, and meat to 6 oz (170 g) each day.  Limit sweets, desserts, sugars, and sugary drinks.  Choose heart-healthy fats.  Eat more home-cooked food and less restaurant, buffet, and fast food.  Limit fried foods.  Do not fry foods. Cook foods using methods such as baking, boiling, grilling, and broiling instead.  When eating at a restaurant, ask that your food be prepared with less salt, or no salt if possible. What foods can I eat? Seek help from a dietitian for individual calorie needs. Grains  Whole grain or whole wheat bread. Brown rice. Whole grain or whole wheat pasta. Quinoa, bulgur, and whole grain  cereals. Low-sodium cereals. Corn or whole wheat flour tortillas. Whole grain cornbread. Whole grain crackers. Low-sodium crackers. Vegetables  Fresh or frozen vegetables (raw, steamed, roasted, or grilled). Low-sodium or reduced-sodium tomato and vegetable juices. Low-sodium or reduced-sodium tomato sauce and paste. Low-sodium or reduced-sodium canned vegetables. Fruits  All fresh, canned (in natural juice), or frozen fruits. Meat and Other Protein Products  Ground beef (85% or leaner), grass-fed beef, or beef trimmed of fat. Skinless chicken or [place]key. Ground chicken or [place]key. Pork trimmed of fat. All fish and seafood. Eggs. Dried beans, peas, or lentils. Unsalted nuts and seeds. Unsalted canned beans. Dairy  Low-fat dairy products, such as skim or 1% milk, 2% or reduced-fat cheeses, low-fat ricotta or cottage cheese, or plain low-fat yogurt. Low-sodium or reduced-sodium cheeses. Fats and Oils  Tub margarines without trans fats. Light or reduced-fat mayonnaise and salad dressings (reduced sodium). Avocado. Safflower, olive, or canola oils. Natural peanut or almond butter. Other  Unsalted popcorn and pretzels. The items listed above may not be a complete list of recommended foods or beverages. Contact your dietitian for more options.  What foods are not recommended? Grains  White bread. White pasta. White rice. Refined cornbread. Bagels and croissants. Crackers that contain trans fat. Vegetables  Creamed or fried vegetables. Vegetables in a cheese sauce. Regular canned vegetables. Regular canned tomato sauce and paste. Regular tomato and vegetable juices. Fruits  Canned fruit in light or heavy syrup. Fruit juice. Meat and Other Protein Products  Fatty cuts of meat. Ribs, chicken wings, bacon, sausage, bologna, salami, chitterlings,  fatback, hot dogs, bratwurst, and packaged luncheon meats. Salted nuts and seeds. Canned beans with salt. Dairy  Whole or 2% milk, cream, half-and-half, and  cream cheese. Whole-fat or sweetened yogurt. Full-fat cheeses or blue cheese. Nondairy creamers and whipped toppings. Processed cheese, cheese spreads, or cheese curds. Condiments  Onion and garlic salt, seasoned salt, table salt, and sea salt. Canned and packaged gravies. Worcestershire sauce. Tartar sauce. Barbecue sauce. Teriyaki sauce. Soy sauce, including reduced sodium. Steak sauce. Fish sauce. Oyster sauce. Cocktail sauce. Horseradish. Ketchup and mustard. Meat flavorings and tenderizers. Bouillon cubes. Hot sauce. Tabasco sauce. Marinades. Taco seasonings. Relishes. Fats and Oils  Butter, stick margarine, lard, shortening, ghee, and bacon fat. Coconut, palm kernel, or palm oils. Regular salad dressings. Other  Pickles and olives. Salted popcorn and pretzels. The items listed above may not be a complete list of foods and beverages to avoid. Contact your dietitian for more information.  Where can I find more information? National Heart, Lung, and Blood Institute: [employer] This information is not intended to replace advice given to you by your health care provider. Make sure you discuss any questions you have with your health care provider. Document Released: 07/06/2011 Document Revised: 12/23/2015 Document Reviewed: 05/21/2013 Elsevier Interactive Patient Education  2017 [employer].     IF you received an x-ray today, you will receive an invoice from [facility]. Please contact [facility] at [phone] with questions or concerns regarding your invoice.   IF you received labwork today, you will receive an invoice from [facility]. Please contact [facility] at [phone] with questions or concerns regarding your invoice.   Our billing staff will not be able to assist you with questions regarding bills from these companies.  You will be contacted with the lab results as soon as they are available. The fastest way to get your  results is to activate your My Chart account. Instructions are located on the last page of this paperwork. If you have not heard from [place] regarding the results in 2 weeks, please contact this office.

## 2016-08-26 LAB — CMP14+EGFR
ALT: 11 IU/L (ref 0–32)
AST: 16 IU/L (ref 0–40)
Albumin/Globulin Ratio: 1.4 (ref 1.2–2.2)
Albumin: 4 g/dL (ref 3.5–5.5)
Alkaline Phosphatase: 55 IU/L (ref 39–117)
BUN/Creatinine Ratio: 10 (ref 9–23)
BUN: 8 mg/dL (ref 6–24)
Bilirubin Total: <0.2 mg/dL (ref 0.0–1.2)
CO2: 20 mmol/L (ref 18–29)
Calcium: 9.5 mg/dL (ref 8.7–10.2)
Chloride: 101 mmol/L (ref 96–106)
Creatinine, Ser: 0.79 mg/dL (ref 0.57–1.00)
GFR calc Af Amer: 106 mL/min/{1.73_m2} (ref >59)
GFR calc non Af Amer: 92 mL/min/{1.73_m2} (ref >59)
Globulin, Total: 2.8 g/dL (ref 1.5–4.5)
Glucose: 85 mg/dL (ref 65–99)
Potassium: 4 mmol/L (ref 3.5–5.2)
Sodium: 140 mmol/L (ref 134–144)
Total Protein: 6.8 g/dL (ref 6.0–8.5)

## 2016-08-26 LAB — TSH: TSH: 1.19 u[IU]/mL (ref 0.450–4.500)

## 2016-09-02 ENCOUNTER — Ambulatory Visit (HOSPITAL_COMMUNITY)
Admission: EM | Admit: 2016-09-02 | Discharge: 2016-09-02 | Disposition: A | Discharge status: Home / Self Care | Payer: Managed Care, Other (non HMO) | Attending: Family Medicine | Admitting: Family Medicine

## 2016-09-02 ENCOUNTER — Encounter (HOSPITAL_COMMUNITY): Payer: Self-pay | Admitting: Family Medicine

## 2016-09-02 DIAGNOSIS — I10 Essential (primary) hypertension: Secondary | ICD-10-CM

## 2016-09-02 DIAGNOSIS — R079 Chest pain, unspecified: Secondary | ICD-10-CM | POA: Diagnosis not present

## 2016-09-02 DIAGNOSIS — R140 Abdominal distension (gaseous): Secondary | ICD-10-CM

## 2016-09-02 MED ORDER — OMEPRAZOLE 20 MG PO CPDR: 20.0000 mg | DELAYED_RELEASE_CAPSULE | Freq: Every day | ORAL | 1 refills | Status: DC

## 2016-09-02 MED ORDER — AMLODIPINE BESYLATE 5 MG PO TABS
5.0000 mg | ORAL_TABLET | Freq: Every day | ORAL | 1 refills | Status: DC
Start: 2016-09-02 — End: 2017-03-16

## 2016-09-02 MED ORDER — BUTALBITAL-APAP-CAFFEINE 50-325-40 MG PO TABS: 1.0000 | ORAL_TABLET | Freq: Four times a day (QID) | ORAL | 0 refills | Status: DC | PRN

## 2016-09-02 NOTE — ED Triage Notes (Signed)
Pt  Seen   About  1  Week  Ago  At [place]       Had  ekg    bp  Was   Slightly  Elevated  At that  Time   She  Reports   Mild  Sharp  Chest  Pain  At  Times   As   Well  As  A  Headache         She  Had  ekg   Done  At that time  And   Was  referrred  To a  Cardiologist

## 2016-09-02 NOTE — ED Provider Notes (Addendum)
[place]    CSN: [id number] Arrival date & time: 09/02/16  1554     History   Chief Complaint Chief Complaint  Patient presents with  . Chest Pain    HPI Sandra Moody is a 46 y.o. female.   This a 46 year old woman who presents to the [facility] urgent care center for evaluation of atypical upper chest pain and uncontrolled blood pressure. She was seen by her primary care doctors weeks ago and started on lisinopril. She is also started on linzesS for irregular bowel pattern. Since that time she's had watery stools, continued high blood pressure, headache, and fluttering feeling in her chest.  Patient is seen with her husband. She works doing IT work.      Past Medical History:  Diagnosis Date  . Asthma     Patient Active Problem List   Diagnosis Date Noted  . Asthma with acute exacerbation 10/04/2015  . Other and unspecified ovarian cyst 06/17/2012  . Pelvic pain in female 06/17/2012  . Bacterial vaginosis 06/17/2012    Past Surgical History:  Procedure Laterality Date  . TUBAL LIGATION      OB History    Gravida Para Term Preterm AB Living   5 3 3  0 2 3   SAB TAB Ectopic Multiple Live Births   1 1 0 0         Home Medications    Prior to Admission medications   Medication Sig Start Date End Date Taking? Authorizing Provider  albuterol (PROVENTIL HFA;VENTOLIN HFA) 108 (90 Base) MCG/ACT inhaler Inhale 1-2 puffs into the lungs every 6 (six) hours as needed for wheezing. 11/12/15   Larene Pickett, PA-C  albuterol (PROVENTIL) (2.5 MG/3ML) 0.083% nebulizer solution Inhale 2.5 mg into the lungs as needed. 05/17/16   Historical Provider, MD  amLODipine (NORVASC) 5 MG tablet Take 1 tablet (5 mg total) by mouth daily. 09/02/16   Robyn Haber, MD  butalbital-acetaminophen-caffeine (FIORICET, ESGIC) [phone] MG tablet Take 1-2 tablets by mouth every 6 (six) hours as needed for headache. 09/02/16 09/02/17  Robyn Haber, MD  EPIPEN  2-PAK 0.3 MG/0.3ML SOAJ injection Inject 0.3 mg into the muscle as directed. 05/18/16   Historical Provider, MD  omeprazole (PRILOSEC) 20 MG capsule Take 1 capsule (20 mg total) by mouth daily. 09/02/16   Robyn Haber, MD  VIORELE 0.15-0.02/0.01 MG (21/5) tablet Take 0.15 mg by mouth daily. 06/18/16   Historical Provider, MD    Family History Family History  Problem Relation Age of Onset  . Thyroid disease Mother   . Heart disease Mother     Social History Social History  Substance Use Topics  . Smoking status: Never Smoker  . Smokeless tobacco: Never Used  . Alcohol use Yes     Comment: occasional     Allergies   Ginger; Passion fruit flavor [flavoring agent]; and Peanuts [peanut oil]   Review of Systems Review of Systems  Constitutional: Negative.   HENT: Negative.   Cardiovascular: Positive for palpitations.  Gastrointestinal: Positive for abdominal distention.  Musculoskeletal: Negative.   Neurological: Positive for headaches.     Physical Exam Triage Vital Signs ED Triage Vitals  Enc Vitals Group     BP      Pulse      Resp      Temp      Temp src      SpO2      Weight      Height  Head Circumference      Peak Flow      Pain Score      Pain Loc      Pain Edu?      Excl. in [place]?    No data found.   Updated Vital Signs BP 142/92 (BP Location: Right Arm)   Pulse 82   Temp 98.7 F (37.1 C) (Oral)   Resp 18   LMP 08/19/2016   SpO2 100%   Visual Acuity Right Eye Distance:   Left Eye Distance:   Bilateral Distance:    Right Eye Near:   Left Eye Near:    Bilateral Near:     Physical Exam  Constitutional: She is oriented to person, place, and time. She appears well-developed and well-nourished.  HENT:  Head: Normocephalic.  Right Ear: External ear normal.  Left Ear: External ear normal.  Mouth/Throat: Oropharynx is clear and moist.  Eyes: Conjunctivae and EOM are normal. Pupils are equal, round, and reactive to light.  Neck: Normal  range of motion. Neck supple. No thyromegaly present.  Cardiovascular: Normal rate, regular rhythm and normal heart sounds.   Pulmonary/Chest: Effort normal and breath sounds normal.  Abdominal: Soft. Bowel sounds are normal. There is no tenderness.  Musculoskeletal: Normal range of motion.  Lymphadenopathy:    She has no cervical adenopathy.  Neurological: She is alert and oriented to person, place, and time.  Skin: Skin is warm and dry.  Nursing note and vitals reviewed.    UC Treatments / Results  Labs (all labs ordered are listed, but only abnormal results are displayed) .this Results for orders placed or performed in visit on 08/25/16  TSH  Result Value Ref Range   TSH 1.190 0.450 - 4.500 uIU/mL  CMP14+EGFR  Result Value Ref Range   Glucose 85 65 - 99 mg/dL   BUN 8 6 - 24 mg/dL   Creatinine, Ser 0.79 0.57 - 1.00 mg/dL   GFR calc non Af Amer 92 >59 mL/min/1.73   GFR calc Af Amer 106 >59 mL/min/1.73   BUN/Creatinine Ratio 10 9 - 23   Sodium 140 134 - 144 mmol/L   Potassium 4.0 3.5 - 5.2 mmol/L   Chloride 101 96 - 106 mmol/L   CO2 20 18 - 29 mmol/L   Calcium 9.5 8.7 - 10.2 mg/dL   Total Protein 6.8 6.0 - 8.5 g/dL   Albumin 4.0 3.5 - 5.5 g/dL   Globulin, Total 2.8 1.5 - 4.5 g/dL   Albumin/Globulin Ratio 1.4 1.2 - 2.2   Bilirubin Total <0.2 0.0 - 1.2 mg/dL   Alkaline Phosphatase 55 39 - 117 IU/L   AST 16 0 - 40 IU/L   ALT 11 0 - 32 IU/L    EKG EKG from January 26 reviewed and found to be normal sinus rhythm.  Radiology No results found.  Procedures Procedures (including critical care time)  Medications Ordered in UC Medications - No data to display   Initial Impression / Assessment and Plan / UC Course  I have reviewed the triage vital signs and the nursing notes.  Pertinent labs & imaging results that were available during my care of the patient were reviewed by me and considered in my medical decision making (see chart for details).     Final Clinical  Impressions(s) / UC Diagnoses   Final diagnoses:  Essential hypertension  Nonspecific chest pain  Bloating    New Prescriptions New Prescriptions   AMLODIPINE (NORVASC) 5 MG TABLET  Take 1 tablet (5 mg total) by mouth daily.   BUTALBITAL-ACETAMINOPHEN-CAFFEINE (FIORICET, ESGIC) 50-325-40 MG TABLET    Take 1-2 tablets by mouth every 6 (six) hours as needed for headache.   OMEPRAZOLE (PRILOSEC) 20 MG CAPSULE    Take 1 capsule (20 mg total) by mouth daily.     Robyn Haber, MD 09/02/16 0034    Robyn Haber, MD 09/02/16 [phone]

## 2016-09-02 NOTE — Discharge Instructions (Signed)
You have mild hypertension. This is not an emergency, but it does need to be controlled. I'm switching her blood pressure medicine in hopes of improving the blood pressure control.  You also have what sounds like irritable bowel syndrome and I have prescribed a medicine to help control acid and reflux symptoms.

## 2016-09-02 NOTE — ED Triage Notes (Signed)
Pt  Reports  Pain  In   Chest   history  Of  Asthma     Pt

## 2016-09-04 ENCOUNTER — Encounter: Payer: Self-pay | Admitting: *Deleted

## 2016-11-20 ENCOUNTER — Ambulatory Visit (INDEPENDENT_AMBULATORY_CARE_PROVIDER_SITE_OTHER): Payer: Managed Care, Other (non HMO) | Admitting: Physician Assistant

## 2016-11-20 VITALS — BP 116/76 | HR 71 | Temp 98.5°F | Resp 18 | Ht 64.0 in | Wt 149.4 lb

## 2016-11-20 DIAGNOSIS — R42 Dizziness and giddiness: Secondary | ICD-10-CM | POA: Diagnosis not present

## 2016-11-20 DIAGNOSIS — R0789 Other chest pain: Secondary | ICD-10-CM | POA: Diagnosis not present

## 2016-11-20 DIAGNOSIS — R110 Nausea: Secondary | ICD-10-CM

## 2016-11-20 LAB — POCT URINALYSIS DIP (MANUAL ENTRY)
Bilirubin, UA: NEGATIVE
Glucose, UA: NEGATIVE mg/dL
Ketones, POC UA: NEGATIVE mg/dL
Leukocytes, UA: NEGATIVE
Nitrite, UA: NEGATIVE
Spec Grav, UA: 1.025 (ref 1.010–1.025)
Urobilinogen, UA: 0.2 E.U./dL
pH, UA: 6 (ref 5.0–8.0)

## 2016-11-20 LAB — POCT CBC
Granulocyte percent: 52 %G (ref 37–80)
HCT, POC: 36.9 % — AB (ref 37.7–47.9)
Hemoglobin: 12.6 g/dL (ref 12.2–16.2)
Lymph, poc: 3.3 (ref 0.6–3.4)
MCH, POC: 31 pg (ref 27–31.2)
MCHC: 34.2 g/dL (ref 31.8–35.4)
MCV: 90.6 fL (ref 80–97)
MID (cbc): 0.7 (ref 0–0.9)
MPV: 7.6 fL (ref 0–99.8)
POC Granulocyte: 4.4 (ref 2–6.9)
POC LYMPH PERCENT: 39.4 %L (ref 10–50)
POC MID %: 8.6 %M (ref 0–12)
Platelet Count, POC: 347 10*3/uL (ref 142–424)
RBC: 4.07 M/uL (ref 4.04–5.48)
RDW, POC: 13.9 %
WBC: 8.5 10*3/uL (ref 4.6–10.2)

## 2016-11-20 MED ORDER — ONDANSETRON 8 MG PO TBDP: 8.0000 mg | ORAL_TABLET | Freq: Three times a day (TID) | ORAL | 0 refills | Status: DC | PRN

## 2016-11-20 NOTE — Progress Notes (Signed)
PRIMARY CARE AT [place] [address] [phone]  Date:  11/20/2016   Name:  Sandra Moody   DOB:  [birth year]   MRN:  [id number]  PCP:  No PCP Per Patient    History of Present Illness:  Sandra Moody is a 46 y.o. female patient who presents to PCP with  Chief Complaint  Patient presents with  . Medication Problem    has leg and feet swelling at night thinks BP medication might be involved  . Medication Refill    BP meds     Her legs are swelling every day around 5pm, and they hurt.  Improved with elevation and rest.  By the next morning, her swelling has resolved.   She is having nausea.  shye is having nausea every day for the 1-2 weeks.  Less of an appetite. Dizziness will be throughout the day, where she can be walking.  These symptoms do not occur at rest.  No new sob, mild diaphoresis.   She is followed by [facility].   She would like a refill of her amlodipine.  She states that she is now taking 23m qd which was started about 1.5 months ago.    Frequency, no hematuria, no dysuria.   Wt Readings from Last 3 Encounters:  11/20/16 149 lb 6.4 oz (67.8 kg)  08/25/16 150 lb (68 kg)  08/24/16 150 lb 3.2 oz (68.1 kg)     Patient Active Problem List   Diagnosis Date Noted  . Asthma with acute exacerbation 10/04/2015  . Other and unspecified ovarian cyst 06/17/2012  . Pelvic pain in female 06/17/2012  . Bacterial vaginosis 06/17/2012    Past Medical History:  Diagnosis Date  . Asthma     Past Surgical History:  Procedure Laterality Date  . TUBAL LIGATION      Social History  Substance Use Topics  . Smoking status: Never Smoker  . Smokeless tobacco: Never Used  . Alcohol use Yes     Comment: occasional    Family History  Problem Relation Age of Onset  . Thyroid disease Mother   . Heart disease Mother     Allergies  Allergen Reactions  . Ginger Itching and Swelling  . Passion Fruit Flavor [Flavoring Agent] Itching and  Swelling  . Peanuts [Peanut Oil] Anaphylaxis    PT ALLERGIC TO ALL TREE NUTS    Medication list has been reviewed and updated.  Current Outpatient Prescriptions on File Prior to Visit  Medication Sig Dispense Refill  . albuterol (PROVENTIL HFA;VENTOLIN HFA) 108 (90 Base) MCG/ACT inhaler Inhale 1-2 puffs into the lungs every 6 (six) hours as needed for wheezing. 1 Inhaler 0  . albuterol (PROVENTIL) (2.5 MG/3ML) 0.083% nebulizer solution Inhale 2.5 mg into the lungs as needed.  5  . amLODipine (NORVASC) 5 MG tablet Take 1 tablet (5 mg total) by mouth daily. 30 tablet 1  . butalbital-acetaminophen-caffeine (FIORICET, ESGIC) 50-325-40 MG tablet Take 1-2 tablets by mouth every 6 (six) hours as needed for headache. 20 tablet 0  . EPIPEN 2-PAK 0.3 MG/0.3ML SOAJ injection Inject 0.3 mg into the muscle as directed.  2  . omeprazole (PRILOSEC) 20 MG capsule Take 1 capsule (20 mg total) by mouth daily. 20 capsule 1  . VIORELE 0.15-0.02/0.01 MG (21/5) tablet Take 0.15 mg by mouth daily.  3   No current facility-administered medications on file prior to visit.     ROS ROS otherwise unremarkable unless listed above.  Physical Examination: BP 116/76   Pulse 71   Temp 98.5 F (36.9 C) (Oral)   Resp 18   Ht 5' 4"  (1.626 m)   Wt 149 lb 6.4 oz (67.8 kg)   SpO2 98%   BMI 25.64 kg/m  Ideal Body Weight: Weight in (lb) to have BMI = 25: 145.3  Physical Exam  Constitutional: She is oriented to person, place, and time. She appears well-developed and well-nourished. No distress.  HENT:  Head: Normocephalic and atraumatic.  Right Ear: External ear normal.  Left Ear: External ear normal.  Eyes: Conjunctivae and EOM are normal. Pupils are equal, round, and reactive to light.  Cardiovascular: Normal rate and regular rhythm.  Exam reveals no friction rub.   No murmur heard. Pulses:      Carotid pulses are 2+ on the right side, and 2+ on the left side.      Dorsalis pedis pulses are 2+ on the right  side, and 2+ on the left side.  Pulmonary/Chest: Effort normal. No respiratory distress.  Neurological: She is alert and oriented to person, place, and time.  Skin: She is not diaphoretic.  Psychiatric: She has a normal mood and affect. Her behavior is normal.    Results for orders placed or performed in visit on 11/20/16  POCT urinalysis dipstick  Result Value Ref Range   Color, UA yellow yellow   Clarity, UA clear clear   Glucose, UA negative negative mg/dL   Bilirubin, UA negative negative   Ketones, POC UA negative negative mg/dL   Spec Grav, UA 1.025 1.010 - 1.025   Blood, UA trace-intact (A) negative   pH, UA 6.0 5.0 - 8.0   Protein Ur, POC trace (A) negative mg/dL   Urobilinogen, UA 0.2 0.2 or 1.0 E.U./dL   Nitrite, UA Negative Negative   Leukocytes, UA Negative Negative  POCT CBC  Result Value Ref Range   WBC 8.5 4.6 - 10.2 K/uL   Lymph, poc 3.3 0.6 - 3.4   POC LYMPH PERCENT 39.4 10 - 50 %L   MID (cbc) 0.7 0 - 0.9   POC MID % 8.6 0 - 12 %M   POC Granulocyte 4.4 2 - 6.9   Granulocyte percent 52.0 37 - 80 %G   RBC 4.07 4.04 - 5.48 M/uL   Hemoglobin 12.6 12.2 - 16.2 g/dL   HCT, POC 36.9 (A) 37.7 - 47.9 %   MCV 90.6 80 - 97 fL   MCH, POC 31.0 27 - 31.2 pg   MCHC 34.2 31.8 - 35.4 g/dL   RDW, POC 13.9 %   Platelet Count, POC 347 142 - 424 K/uL   MPV 7.6 0 - 99.8 fL    Orthostatic VS for the past 24 hrs (Last 3 readings):  BP- Lying Pulse- Lying BP- Sitting Pulse- Sitting BP- Standing at 0 minutes Pulse- Standing at 0 minutes  11/20/16 1624 114/78 75 122/81 85 116/83 95    Assessment and Plan: Sandra Moody is a 46 y.o. female who is here today for cc of extremity edema BP well within normal limits without bp medicine.  Possible that the blood pressure medication may have caused this peripheral edema.  I am advising that she increase her hydration of water.   Given prescription for compression stockings. orthostats may be result of dehydration, concentration of  ua, and self reports that she "hates water", and does not drink fluids regularly. Advised to return in 5 days.  We will recheck symptoms and  bp.  If it is elevated, consider hctz instead Dizziness - Plan: POCT urinalysis dipstick, POCT CBC, EKG 12-Lead, Sedimentation rate, Orthostatic vital signs, CMP14+EGFR  Nausea without vomiting - Plan: POCT CBC, EKG 12-Lead, CMP14+EGFR, ondansetron (ZOFRAN-ODT) 8 MG disintegrating tablet  Other chest pain - Plan: POCT CBC, EKG 12-Lead, Orthostatic vital signs, CMP14+EGFR  Ivar Drape, PA-C [facility] 4/25/20188:26 AM

## 2016-11-20 NOTE — Patient Instructions (Addendum)
  I want you to hydrate well with 64 oz of water.   If you have chest pains, you need to go to the emergency department, so they can test cardiac enzymes over time.  You should also return to the cardiologist.      IF you received an x-ray today, you will receive an invoice from [facility]. Please contact [facility] at [phone] with questions or concerns regarding your invoice.   IF you received labwork today, you will receive an invoice from [place]. Please contact [facility] at [phone] with questions or concerns regarding your invoice.   Our billing staff will not be able to assist you with questions regarding bills from these companies.  You will be contacted with the lab results as soon as they are available. The fastest way to get your results is to activate your My Chart account. Instructions are located on the last page of this paperwork. If you have not heard from [place] regarding the results in 2 weeks, please contact this office.

## 2016-11-21 LAB — CMP14+EGFR
ALT: 17 IU/L (ref 0–32)
AST: 18 IU/L (ref 0–40)
Albumin/Globulin Ratio: 1.6 (ref 1.2–2.2)
Albumin: 4.4 g/dL (ref 3.5–5.5)
Alkaline Phosphatase: 60 IU/L (ref 39–117)
BUN/Creatinine Ratio: 7 — ABNORMAL LOW (ref 9–23)
BUN: 6 mg/dL (ref 6–24)
Bilirubin Total: <0.2 mg/dL (ref 0.0–1.2)
CO2: 23 mmol/L (ref 18–29)
Calcium: 9.2 mg/dL (ref 8.7–10.2)
Chloride: 103 mmol/L (ref 96–106)
Creatinine, Ser: 0.85 mg/dL (ref 0.57–1.00)
GFR calc Af Amer: 96 mL/min/{1.73_m2} (ref >59)
GFR calc non Af Amer: 84 mL/min/{1.73_m2} (ref >59)
Globulin, Total: 2.8 g/dL (ref 1.5–4.5)
Glucose: 102 mg/dL — ABNORMAL HIGH (ref 65–99)
Potassium: 3.7 mmol/L (ref 3.5–5.2)
Sodium: 140 mmol/L (ref 134–144)
Total Protein: 7.2 g/dL (ref 6.0–8.5)

## 2016-11-21 LAB — SEDIMENTATION RATE: Sed Rate: 12 mm/hr (ref 0–32)

## 2016-11-23 ENCOUNTER — Ambulatory Visit (INDEPENDENT_AMBULATORY_CARE_PROVIDER_SITE_OTHER): Payer: Managed Care, Other (non HMO) | Admitting: Physician Assistant

## 2016-11-23 VITALS — BP 119/72 | HR 81 | Temp 98.6°F | Resp 16 | Ht 64.0 in | Wt 152.0 lb

## 2016-11-23 DIAGNOSIS — R519 Headache, unspecified: Secondary | ICD-10-CM

## 2016-11-23 DIAGNOSIS — R42 Dizziness and giddiness: Secondary | ICD-10-CM

## 2016-11-23 DIAGNOSIS — R6881 Early satiety: Secondary | ICD-10-CM

## 2016-11-23 DIAGNOSIS — R635 Abnormal weight gain: Secondary | ICD-10-CM

## 2016-11-23 DIAGNOSIS — R51 Headache: Secondary | ICD-10-CM

## 2016-11-23 DIAGNOSIS — R350 Frequency of micturition: Secondary | ICD-10-CM

## 2016-11-23 DIAGNOSIS — R140 Abdominal distension (gaseous): Secondary | ICD-10-CM | POA: Diagnosis not present

## 2016-11-23 LAB — POCT URINE PREGNANCY: Preg Test, Ur: NEGATIVE

## 2016-11-23 MED ORDER — AMITRIPTYLINE HCL 25 MG PO TABS: 25.0000 mg | ORAL_TABLET | Freq: Every day | ORAL | 1 refills | Status: DC

## 2016-11-23 NOTE — Progress Notes (Signed)
PRIMARY CARE AT [facility] [address], [place] [address] [phone]  Date:  11/23/2016   Name:  Sandra Moody   DOB:  1971-02-26   MRN:  [id number]  PCP:  No PCP Per Patient    History of Present Illness:  Sandra Moody is a 46 y.o. female patient who presents to PCP with  Chief Complaint  Patient presents with  . Follow-up    leg swelling     Patient reports that her knee swelling continues.  It appears like it may be less, but still occurs after 5pm.  She is not using the compression stockings.   She has unilateral left sided head pain along the side of her head that almost is constant for the last 2 weeks.  She has no vision changes, or associated nausea.  No auras, or photophobia.  She has taken nothing for her head pain.   Patient in the last several months reports early satiety.  She will eat a cup of applesauce over 24 hours, drinks a cup of juice, or one piece of chicken.  She feels full quickly.  Also complains of abdominal bloating.   She will rarely eats vegetables or drinks any water. She has bowel movements daily.  She was given linzess at one time for constipation but no change in her symptoms.   Patient Active Problem List   Diagnosis Date Noted  . Asthma with acute exacerbation 10/04/2015  . Other and unspecified ovarian cyst 06/17/2012  . Pelvic pain in female 06/17/2012  . Bacterial vaginosis 06/17/2012    Past Medical History:  Diagnosis Date  . Asthma     Past Surgical History:  Procedure Laterality Date  . TUBAL LIGATION      Social History  Substance Use Topics  . Smoking status: Never Smoker  . Smokeless tobacco: Never Used  . Alcohol use Yes     Comment: occasional    Family History  Problem Relation Age of Onset  . Thyroid disease Mother   . Heart disease Mother     Allergies  Allergen Reactions  . Ginger Itching and Swelling  . Passion Fruit Flavor [Flavoring Agent] Itching and Swelling  . Peanuts [Peanut Oil] Anaphylaxis   PT ALLERGIC TO ALL TREE NUTS    Medication list has been reviewed and updated.  Current Outpatient Prescriptions on File Prior to Visit  Medication Sig Dispense Refill  . albuterol (PROVENTIL HFA;VENTOLIN HFA) 108 (90 Base) MCG/ACT inhaler Inhale 1-2 puffs into the lungs every 6 (six) hours as needed for wheezing. 1 Inhaler 0  . albuterol (PROVENTIL) (2.5 MG/3ML) 0.083% nebulizer solution Inhale 2.5 mg into the lungs as needed.  5  . amLODipine (NORVASC) 5 MG tablet Take 1 tablet (5 mg total) by mouth daily. 30 tablet 1  . beclomethasone (QVAR) 80 MCG/ACT inhaler Inhale 1 puff into the lungs 2 (two) times daily.    . butalbital-acetaminophen-caffeine (FIORICET, ESGIC) 50-325-40 MG tablet Take 1-2 tablets by mouth every 6 (six) hours as needed for headache. 20 tablet 0  . EPIPEN 2-PAK 0.3 MG/0.3ML SOAJ injection Inject 0.3 mg into the muscle as directed.  2  . omeprazole (PRILOSEC) 20 MG capsule Take 1 capsule (20 mg total) by mouth daily. 20 capsule 1  . ondansetron (ZOFRAN-ODT) 8 MG disintegrating tablet Take 1 tablet (8 mg total) by mouth every 8 (eight) hours as needed for nausea. 30 tablet 0  . VIORELE 0.15-0.02/0.01 MG (21/5) tablet Take 0.15 mg by mouth daily.  3   No current facility-administered medications on file prior to visit.     ROS ROS otherwise unremarkable unless listed above.  Physical Examination: BP 119/72   Pulse 81   Temp 98.6 F (37 C) (Oral)   Resp 16   Ht  (1.626 m)   Wt 152 lb (68.9 kg)   SpO2 100%   BMI 26.09 kg/m  Ideal Body Weight: Weight in (lb) to have BMI = 25: 145.3  Physical Exam  Constitutional: She is oriented to person, place, and time. She appears well-developed and well-nourished. No distress.  HENT:  Head: Normocephalic and atraumatic.  Right Ear: External ear normal.  Left Ear: External ear normal.  Eyes: Conjunctivae and EOM are normal. Pupils are equal, round, and reactive to light.  Cardiovascular: Normal rate and regular  rhythm.  Exam reveals no friction rub.   No murmur heard. Pulses:      Dorsalis pedis pulses are 2+ on the right side, and 2+ on the left side.       Posterior tibial pulses are 2+ on the right side, and 2+ on the left side.  Pulmonary/Chest: Effort normal. No accessory muscle usage. No respiratory distress. She has no decreased breath sounds. She has no wheezes.  Abdominal: Soft. Normal appearance and bowel sounds are normal. There is no tenderness.  Musculoskeletal:       Left knee: She exhibits normal range of motion, no swelling, no LCL laxity and no MCL laxity. No tenderness found. No medial joint line, no lateral joint line, no MCL and no LCL tenderness noted.  Neurological: She is alert and oriented to person, place, and time.  Skin: She is not diaphoretic.  Psychiatric: She has a normal mood and affect. Her behavior is normal.     Assessment and Plan: Sandra Moody is a 46 y.o. female who is here today for follow up of her lower extremity edema.  Nonintractable headache, unspecified chronicity pattern, unspecified headache type - Plan: amitriptyline (ELAVIL) 25 MG tablet  Weight gain - Plan: POCT urine pregnancy  Lightheadedness - Plan: POCT urine pregnancy  Abdominal bloating - Plan: Ambulatory referral to Gynecology  Early satiety - Plan: Ambulatory referral to Gynecology  Urinary frequency - Plan: Ambulatory referral to Gynecology  Trena Platt, PA-C [facility] 4/26/20186:48 PM fs

## 2016-11-23 NOTE — Patient Instructions (Addendum)
Make sure that you are hydrating well with water, of at least 64 oz.  Make sure you are having vegetables with half the portion of your plate.   I would like you to follow up with your gynecologist at this time so please await contact.    Please take naproxen  twice every 12 hours.  Take one omeprazole  if you take the naproxen. Make sure that you are hydrating well.  You will also opt for a multi-vitamin.     General Headache Without Cause A headache is pain or discomfort felt around the head or neck area. There are many causes and types of headaches. In some cases, the cause may not be found. Follow these instructions at home: Managing pain   Take over-the-counter and prescription medicines only as told by your doctor.  Lie down in a dark, quiet room when you have a headache.  If directed, apply ice to the head and neck area:  Put ice in a plastic bag.  Place a towel between your skin and the bag.  Leave the ice on for 20 minutes, 2-3 times per day.  Use a heating pad or hot shower to apply heat to the head and neck area as told by your doctor.  Keep lights dim if bright lights bother you or make your headaches worse. Eating and drinking   Eat meals on a regular schedule.  Lessen how much alcohol you drink.  Lessen how much caffeine you drink, or stop drinking caffeine. General instructions   Keep all follow-up visits as told by your doctor. This is important.  Keep a journal to find out if certain things bring on headaches. For example, write down:  What you eat and drink.  How much sleep you get.  Any change to your diet or medicines.  Relax by getting a massage or doing other relaxing activities.  Lessen stress.  Sit up straight. Do not tighten (tense) your muscles.  Do not use tobacco products. This includes cigarettes, chewing tobacco, or e-cigarettes. If you need help quitting, ask your doctor.  Exercise regularly as told by your doctor.  Get  enough sleep. This often means 7-9 hours of sleep. Contact a doctor if:  Your symptoms are not helped by medicine.  You have a headache that feels different than the other headaches.  You feel sick to your stomach (nauseous) or you throw up (vomit).  You have a fever. Get help right away if:  Your headache becomes really bad.  You keep throwing up.  You have a stiff neck.  You have trouble seeing.  You have trouble speaking.  You have pain in the eye or ear.  Your muscles are weak or you lose muscle control.  You lose your balance or have trouble walking.  You feel like you will pass out (faint) or you pass out.  You have confusion. This information is not intended to replace advice given to you by your health care provider. Make sure you discuss any questions you have with your health care provider. Document Released: 04/25/2008 Document Revised: 12/23/2015 Document Reviewed: 11/09/2014 Elsevier Interactive Patient Education  2017 [employer].   IF you received an x-ray today, you will receive an invoice from [facility]. Please contact [facility] at [phone] with questions or concerns regarding your invoice.   IF you received labwork today, you will receive an invoice from [facility]. Please contact [facility] at [phone] with questions or concerns regarding your invoice.   Our  billing staff will not be able to assist you with questions regarding bills from these companies.  You will be contacted with the lab results as soon as they are available. The fastest way to get your results is to activate your My Chart account. Instructions are located on the last page of this paperwork. If you have not heard from [place] regarding the results in 2 weeks, please contact this office.

## 2017-01-25 ENCOUNTER — Encounter: Payer: Self-pay | Admitting: Family Medicine

## 2017-01-25 ENCOUNTER — Ambulatory Visit (INDEPENDENT_AMBULATORY_CARE_PROVIDER_SITE_OTHER): Payer: Managed Care, Other (non HMO) | Admitting: Family Medicine

## 2017-01-25 VITALS — BP 135/84 | HR 87 | Temp 99.2°F | Resp 18 | Ht 65.28 in | Wt 149.6 lb

## 2017-01-25 DIAGNOSIS — R110 Nausea: Secondary | ICD-10-CM

## 2017-01-25 DIAGNOSIS — B349 Viral infection, unspecified: Secondary | ICD-10-CM | POA: Diagnosis not present

## 2017-01-25 DIAGNOSIS — G43809 Other migraine, not intractable, without status migrainosus: Secondary | ICD-10-CM | POA: Diagnosis not present

## 2017-01-25 MED ORDER — TRAMADOL HCL 50 MG PO TABS: ORAL_TABLET | ORAL | 0 refills | Status: DC

## 2017-01-25 MED ORDER — ONDANSETRON 4 MG PO TBDP: 4.0000 mg | ORAL_TABLET | Freq: Three times a day (TID) | ORAL | 0 refills | Status: DC | PRN

## 2017-01-25 NOTE — Progress Notes (Signed)
Patient ID: Sandra Moody, female    DOB: [birth year]  Age: 46 y.o. MRN: [id number]  Chief Complaint  Patient presents with  . Headache    X 2 days - pt statesfever/headaches  . Nausea    X 2 days    Subjective:   46 year old lady who has not been feeling well last couple of days. She had a little bit of fever up to 100.2 she had nausea without vomiting. She's had a persistent headache. She has a history of a lot of headaches. She is taken some Tylenol. She left work early today because of the headache. She is headed out today to go somewhere to the drag races and wants to feel better.  Current allergies, medications, problem list, past/family and social histories reviewed.  Objective:  BP 135/84 (BP Location: Right Arm, Patient Position: Sitting, Cuff Size: Normal)   Pulse 87   Temp 99.2 F (37.3 C) (Oral)   Resp 18   Ht 5' 5.28" (1.658 m)   Wt 149 lb 9.6 oz (67.9 kg)   LMP 01/19/2017 (Approximate)   SpO2 100%   BMI 24.68 kg/m   Looks like she doesn't feel well. Her TMs are normal. Eyes PERRLA. Throat clear. Neck supple without nodes. Chest clear. Heart regular without murmurs. Himself mass or tenderness.  Assessment & Plan:   Assessment: 1. Viral illness   2. Nausea without vomiting   3. Other migraine without status migrainosus, not intractable       Plan: We'll treat symptomatically. She may find little viral infection that triggered all this but she does have a history of frequent headaches. See instructions.  No orders of the defined types were placed in this encounter.   Meds ordered this encounter  Medications  . ondansetron (ZOFRAN ODT) 4 MG disintegrating tablet    Sig: Take 1 tablet (4 mg total) by mouth every 8 (eight) hours as needed for nausea or vomiting.    Dispense:  10 tablet    Refill:  0  . traMADol (ULTRAM) 50 MG tablet    Sig: Take 1 every 6 or 8 hours if needed for severe headache    Dispense:  12 tablet    Refill:  0          Patient Instructions   Take Tylenol 500 mg 2 pills every 8 hours or ibuprofen 200 mg 3 pills every 6-8 hours as needed for headache.  Take ondansetron (Zofran) 4 mg 1 every 8 hours as needed for nausea  Take the tramadol 50 mg 1 every 6 or 8 hours only if needed for severe headache pain.  Return if needed  Drink plenty of fluids and get enough rest    IF you received an x-ray today, you will receive an invoice from [facility]. Please contact [facility] at [phone] with questions or concerns regarding your invoice.   IF you received labwork today, you will receive an invoice from [facility]. Please contact [facility] at [phone] with questions or concerns regarding your invoice.   Our billing staff will not be able to assist you with questions regarding bills from these companies.  You will be contacted with the lab results as soon as they are available. The fastest way to get your results is to activate your My Chart account. Instructions are located on the last page of this paperwork. If you have not heard from us regarding the results in 2 weeks, please contact this office.  Return if symptoms worsen or fail to improve.   Catriona Dillenbeck, MD 01/25/2017

## 2017-01-25 NOTE — Patient Instructions (Addendum)
Take Tylenol 500 mg 2 pills every 8 hours or ibuprofen 200 mg 3 pills every 6-8 hours as needed for headache.  Take ondansetron (Zofran) 4 mg 1 every 8 hours as needed for nausea  Take the tramadol 50 mg 1 every 6 or 8 hours only if needed for severe headache pain.  Return if needed  Drink plenty of fluids and get enough rest    IF you received an x-ray today, you will receive an invoice from [facility]. Please contact [facility] at [phone] with questions or concerns regarding your invoice.   IF you received labwork today, you will receive an invoice from [facility]. Please contact [facility] at [phone] with questions or concerns regarding your invoice.   Our billing staff will not be able to assist you with questions regarding bills from these companies.  You will be contacted with the lab results as soon as they are available. The fastest way to get your results is to activate your My Chart account. Instructions are located on the last page of this paperwork. If you have not heard from us regarding the results in 2 weeks, please contact this office.

## 2017-02-20 ENCOUNTER — Other Ambulatory Visit: Payer: Self-pay | Admitting: Gastroenterology

## 2017-02-20 DIAGNOSIS — R1033 Periumbilical pain: Secondary | ICD-10-CM

## 2017-02-20 DIAGNOSIS — R112 Nausea with vomiting, unspecified: Secondary | ICD-10-CM

## 2017-02-20 NOTE — Progress Notes (Signed)
Sandra Forand MD 

## 2017-02-27 ENCOUNTER — Encounter (HOSPITAL_COMMUNITY)
Admission: RE | Admit: 2017-02-27 | Discharge: 2017-02-27 | Disposition: A | Discharge status: Home / Self Care | Payer: Managed Care, Other (non HMO) | Source: Ambulatory Visit | Attending: Gastroenterology | Admitting: Gastroenterology

## 2017-02-27 DIAGNOSIS — R112 Nausea with vomiting, unspecified: Secondary | ICD-10-CM | POA: Insufficient documentation

## 2017-02-27 DIAGNOSIS — R1033 Periumbilical pain: Secondary | ICD-10-CM | POA: Diagnosis present

## 2017-02-27 MED ORDER — TECHNETIUM TC 99M MEBROFENIN IV KIT
5.0000 | PACK | Freq: Once | INTRAVENOUS | Status: AC | PRN
  Administered 2017-02-27: 5 via INTRAVENOUS

## 2017-03-07 DIAGNOSIS — K811 Chronic cholecystitis: Principal | ICD-10-CM | POA: Insufficient documentation

## 2017-03-07 DIAGNOSIS — K828 Other specified diseases of gallbladder: Secondary | ICD-10-CM | POA: Diagnosis present

## 2017-03-07 DIAGNOSIS — R1011 Right upper quadrant pain: Secondary | ICD-10-CM | POA: Diagnosis present

## 2017-03-07 NOTE — ED Notes (Signed)
Pt called multiple times in waiting room for reassessment of vital signs and no response. Pt left post triage,. 

## 2017-03-07 NOTE — ED Notes (Signed)
Called pt to be reassess with no response.

## 2017-03-07 NOTE — ED Notes (Signed)
Pt found in waiting room. Pt went outside when called for vs but now back.

## 2017-03-08 ENCOUNTER — Encounter (HOSPITAL_COMMUNITY): Payer: Self-pay | Admitting: General Practice

## 2017-03-08 ENCOUNTER — Emergency Department (HOSPITAL_COMMUNITY): Payer: Managed Care, Other (non HMO)

## 2017-03-08 ENCOUNTER — Observation Stay (HOSPITAL_COMMUNITY): Payer: Managed Care, Other (non HMO) | Admitting: Anesthesiology

## 2017-03-08 ENCOUNTER — Encounter (HOSPITAL_COMMUNITY)
Admission: EM | Disposition: A | Discharge status: Home / Self Care | Payer: Self-pay | Source: Home / Self Care | Attending: Emergency Medicine

## 2017-03-08 ENCOUNTER — Observation Stay (HOSPITAL_COMMUNITY)
Admission: EM | Admit: 2017-03-08 | Discharge: 2017-03-09 | Disposition: A | Discharge status: Home / Self Care | Payer: Managed Care, Other (non HMO) | Attending: Surgery | Admitting: Surgery

## 2017-03-08 DIAGNOSIS — K828 Other specified diseases of gallbladder: Secondary | ICD-10-CM | POA: Diagnosis present

## 2017-03-08 DIAGNOSIS — R1011 Right upper quadrant pain: Secondary | ICD-10-CM

## 2017-03-08 HISTORY — DX: Helicobacter pylori (H. pylori) as the cause of diseases classified elsewhere: K27.9

## 2017-03-08 HISTORY — DX: Migraine, unspecified, not intractable, without status migrainosus: G43.909

## 2017-03-08 HISTORY — DX: Gastro-esophageal reflux disease without esophagitis: K21.9

## 2017-03-08 HISTORY — DX: Headache, unspecified: R51.9

## 2017-03-08 HISTORY — DX: Disorder of kidney and ureter, unspecified: N28.9

## 2017-03-08 HISTORY — DX: Nausea with vomiting, unspecified: R11.2

## 2017-03-08 HISTORY — DX: Personal history of other diseases of the digestive system: Z87.19

## 2017-03-08 HISTORY — DX: Headache: R51

## 2017-03-08 HISTORY — DX: Other specified postprocedural states: Z98.890

## 2017-03-08 HISTORY — PX: LAPAROSCOPIC CHOLECYSTECTOMY: SUR755

## 2017-03-08 HISTORY — PX: CHOLECYSTECTOMY: SHX55

## 2017-03-08 HISTORY — DX: Endocarditis, valve unspecified: I38

## 2017-03-08 HISTORY — DX: Helicobacter pylori (H. pylori) as the cause of diseases classified elsewhere: B96.81

## 2017-03-08 LAB — COMPREHENSIVE METABOLIC PANEL
ALT: 15 U/L (ref 14–54)
AST: 30 U/L (ref 15–41)
Albumin: 3.5 g/dL (ref 3.5–5.0)
Alkaline Phosphatase: 39 U/L (ref 38–126)
Anion gap: 8 (ref 5–15)
BUN: 5 mg/dL — ABNORMAL LOW (ref 6–20)
CO2: 26 mmol/L (ref 22–32)
Calcium: 8.9 mg/dL (ref 8.9–10.3)
Chloride: 104 mmol/L (ref 101–111)
Creatinine, Ser: 0.96 mg/dL (ref 0.44–1.00)
GFR calc Af Amer: >60 mL/min (ref >60)
GFR calc non Af Amer: >60 mL/min (ref >60)
Glucose, Bld: 98 mg/dL (ref 65–99)
Potassium: 4.6 mmol/L (ref 3.5–5.1)
Sodium: 138 mmol/L (ref 135–145)
Total Bilirubin: 1 mg/dL (ref 0.3–1.2)
Total Protein: 6.2 g/dL — ABNORMAL LOW (ref 6.5–8.1)

## 2017-03-08 LAB — CBC WITH DIFFERENTIAL/PLATELET
Basophils Absolute: 0 10*3/uL (ref 0.0–0.1)
Basophils Relative: 0 %
Eosinophils Absolute: 0.1 10*3/uL (ref 0.0–0.7)
Eosinophils Relative: 1 %
HCT: 37.3 % (ref 36.0–46.0)
Hemoglobin: 12.2 g/dL (ref 12.0–15.0)
Lymphocytes Relative: 45 %
Lymphs Abs: 2.6 10*3/uL (ref 0.7–4.0)
MCH: 30 pg (ref 26.0–34.0)
MCHC: 32.7 g/dL (ref 30.0–36.0)
MCV: 91.6 fL (ref 78.0–100.0)
Monocytes Absolute: 0.6 10*3/uL (ref 0.1–1.0)
Monocytes Relative: 10 %
Neutro Abs: 2.5 10*3/uL (ref 1.7–7.7)
Neutrophils Relative %: 44 %
Platelets: 335 10*3/uL (ref 150–400)
RBC: 4.07 MIL/uL (ref 3.87–5.11)
RDW: 12.7 % (ref 11.5–15.5)
WBC: 5.7 10*3/uL (ref 4.0–10.5)

## 2017-03-08 LAB — LIPASE, BLOOD: Lipase: 25 U/L (ref 11–51)

## 2017-03-08 LAB — I-STAT BETA HCG BLOOD, ED (MC, WL, AP ONLY): I-stat hCG, quantitative: <5 m[IU]/mL (ref <5)

## 2017-03-08 SURGERY — LAPAROSCOPIC CHOLECYSTECTOMY
Anesthesia: General | Site: Abdomen

## 2017-03-08 MED ORDER — DIPHENHYDRAMINE HCL 25 MG PO CAPS: 25.0000 mg | ORAL_CAPSULE | Freq: Four times a day (QID) | ORAL | Status: DC | PRN

## 2017-03-08 MED ORDER — ONDANSETRON 4 MG PO TBDP
4.0000 mg | ORAL_TABLET | Freq: Four times a day (QID) | ORAL | Status: DC | PRN
Start: 2017-03-08 — End: 2017-03-09

## 2017-03-08 MED ORDER — SUCCINYLCHOLINE CHLORIDE 20 MG/ML IJ SOLN
INTRAMUSCULAR | Status: DC | PRN
  Administered 2017-03-08: 120 mg via INTRAVENOUS

## 2017-03-08 MED ORDER — BUPIVACAINE-EPINEPHRINE (PF) 0.25% -1:200000 IJ SOLN
INTRAMUSCULAR | Status: AC
  Filled 2017-03-08: qty 30

## 2017-03-08 MED ORDER — HYDROMORPHONE HCL 1 MG/ML IJ SOLN: 0.5000 mg | INTRAMUSCULAR | Status: DC | PRN

## 2017-03-08 MED ORDER — FENTANYL CITRATE (PF) 100 MCG/2ML IJ SOLN
25.0000 ug | INTRAMUSCULAR | Status: DC | PRN
  Administered 2017-03-08: 50 ug via INTRAVENOUS

## 2017-03-08 MED ORDER — FENTANYL CITRATE (PF) 100 MCG/2ML IJ SOLN
INTRAMUSCULAR | Status: DC | PRN
  Administered 2017-03-08: 50 ug via INTRAVENOUS
  Administered 2017-03-08: 100 ug via INTRAVENOUS
  Administered 2017-03-08: 50 ug via INTRAVENOUS

## 2017-03-08 MED ORDER — DIPHENHYDRAMINE HCL 50 MG/ML IJ SOLN: 25.0000 mg | Freq: Four times a day (QID) | INTRAMUSCULAR | Status: DC | PRN

## 2017-03-08 MED ORDER — ONDANSETRON 4 MG PO TBDP: 4.0000 mg | ORAL_TABLET | Freq: Four times a day (QID) | ORAL | Status: DC | PRN

## 2017-03-08 MED ORDER — OXYCODONE HCL 5 MG PO TABS: 5.0000 mg | ORAL_TABLET | ORAL | Status: DC | PRN

## 2017-03-08 MED ORDER — PNEUMOCOCCAL VAC POLYVALENT 25 MCG/0.5ML IJ INJ
0.5000 mL | INJECTION | INTRAMUSCULAR | Status: AC
  Administered 2017-03-09: 0.5 mL via INTRAMUSCULAR
  Filled 2017-03-08: qty 0.5

## 2017-03-08 MED ORDER — TRAMADOL HCL 50 MG PO TABS
50.0000 mg | ORAL_TABLET | Freq: Four times a day (QID) | ORAL | Status: DC | PRN
  Administered 2017-03-08: 50 mg via ORAL
  Filled 2017-03-08: qty 1

## 2017-03-08 MED ORDER — HYDROCODONE-ACETAMINOPHEN 5-325 MG PO TABS: 1.0000 | ORAL_TABLET | Freq: Four times a day (QID) | ORAL | 0 refills | Status: DC | PRN

## 2017-03-08 MED ORDER — KETOROLAC TROMETHAMINE 30 MG/ML IJ SOLN
INTRAMUSCULAR | Status: DC | PRN
  Administered 2017-03-08: 30 mg via INTRAVENOUS

## 2017-03-08 MED ORDER — MIDAZOLAM HCL 2 MG/2ML IJ SOLN
INTRAMUSCULAR | Status: AC
  Filled 2017-03-08: qty 2

## 2017-03-08 MED ORDER — SODIUM CHLORIDE 0.9 % IR SOLN
Status: DC | PRN
  Administered 2017-03-08: 1000 mL

## 2017-03-08 MED ORDER — SODIUM CHLORIDE 0.9 % IV BOLUS (SEPSIS)
1000.0000 mL | Freq: Once | INTRAVENOUS | Status: AC
  Administered 2017-03-08: 1000 mL via INTRAVENOUS

## 2017-03-08 MED ORDER — SODIUM CHLORIDE 0.9 % IJ SOLN
INTRAMUSCULAR | Status: AC
  Filled 2017-03-08: qty 10

## 2017-03-08 MED ORDER — MORPHINE SULFATE (PF) 4 MG/ML IV SOLN: 4.0000 mg | Freq: Once | INTRAVENOUS | Status: DC

## 2017-03-08 MED ORDER — MORPHINE SULFATE (PF) 4 MG/ML IV SOLN
4.0000 mg | Freq: Once | INTRAVENOUS | Status: AC
  Administered 2017-03-08: 4 mg via INTRAVENOUS
  Filled 2017-03-08: qty 1

## 2017-03-08 MED ORDER — PROMETHAZINE HCL 25 MG/ML IJ SOLN: 6.2500 mg | INTRAMUSCULAR | Status: DC | PRN

## 2017-03-08 MED ORDER — SODIUM CHLORIDE 0.9 % IV SOLN
INTRAVENOUS | Status: DC
  Administered 2017-03-08 (×2): via INTRAVENOUS

## 2017-03-08 MED ORDER — BOOST / RESOURCE BREEZE PO LIQD: 1.0000 | Freq: Three times a day (TID) | ORAL | Status: DC

## 2017-03-08 MED ORDER — LACTATED RINGERS IV SOLN
INTRAVENOUS | Status: DC | PRN
  Administered 2017-03-08 (×2): via INTRAVENOUS

## 2017-03-08 MED ORDER — LIDOCAINE HCL (CARDIAC) 20 MG/ML IV SOLN
INTRAVENOUS | Status: DC | PRN
  Administered 2017-03-08: 80 mg via INTRAVENOUS

## 2017-03-08 MED ORDER — ONDANSETRON HCL 4 MG/2ML IJ SOLN
INTRAMUSCULAR | Status: DC | PRN
  Administered 2017-03-08: 4 mg via INTRAVENOUS

## 2017-03-08 MED ORDER — PROPOFOL 10 MG/ML IV BOLUS
INTRAVENOUS | Status: DC | PRN
  Administered 2017-03-08: 180 mg via INTRAVENOUS

## 2017-03-08 MED ORDER — METOPROLOL TARTRATE 5 MG/5ML IV SOLN: 5.0000 mg | Freq: Four times a day (QID) | INTRAVENOUS | Status: DC | PRN

## 2017-03-08 MED ORDER — 0.9 % SODIUM CHLORIDE (POUR BTL) OPTIME
TOPICAL | Status: DC | PRN
  Administered 2017-03-08: 1000 mL

## 2017-03-08 MED ORDER — DEXAMETHASONE SODIUM PHOSPHATE 10 MG/ML IJ SOLN
INTRAMUSCULAR | Status: DC | PRN
  Administered 2017-03-08: 10 mg via INTRAVENOUS

## 2017-03-08 MED ORDER — ENOXAPARIN SODIUM 40 MG/0.4ML ~~LOC~~ SOLN
40.0000 mg | SUBCUTANEOUS | Status: DC
  Administered 2017-03-09: 40 mg via SUBCUTANEOUS
  Filled 2017-03-08: qty 0.4

## 2017-03-08 MED ORDER — PROCHLORPERAZINE MALEATE 5 MG PO TABS: 10.0000 mg | ORAL_TABLET | Freq: Four times a day (QID) | ORAL | Status: DC | PRN

## 2017-03-08 MED ORDER — ONDANSETRON HCL 4 MG/2ML IJ SOLN
4.0000 mg | Freq: Four times a day (QID) | INTRAMUSCULAR | Status: DC | PRN
  Administered 2017-03-08: 4 mg via INTRAVENOUS
  Filled 2017-03-08: qty 2

## 2017-03-08 MED ORDER — ONDANSETRON HCL 4 MG/2ML IJ SOLN
INTRAMUSCULAR | Status: AC
  Filled 2017-03-08: qty 2

## 2017-03-08 MED ORDER — MIDAZOLAM HCL 5 MG/5ML IJ SOLN
INTRAMUSCULAR | Status: DC | PRN
  Administered 2017-03-08: 2 mg via INTRAVENOUS

## 2017-03-08 MED ORDER — HYDROCODONE-ACETAMINOPHEN 5-325 MG PO TABS
1.0000 | ORAL_TABLET | ORAL | Status: DC | PRN
  Administered 2017-03-08 – 2017-03-09 (×3): 1 via ORAL
  Filled 2017-03-08 (×3): qty 1

## 2017-03-08 MED ORDER — ARTIFICIAL TEARS OPHTHALMIC OINT
TOPICAL_OINTMENT | OPHTHALMIC | Status: AC
  Filled 2017-03-08: qty 3.5

## 2017-03-08 MED ORDER — ONDANSETRON 4 MG PO TBDP: 4.0000 mg | ORAL_TABLET | Freq: Three times a day (TID) | ORAL | 0 refills | Status: DC | PRN

## 2017-03-08 MED ORDER — FENTANYL CITRATE (PF) 250 MCG/5ML IJ SOLN
INTRAMUSCULAR | Status: AC
  Filled 2017-03-08: qty 5

## 2017-03-08 MED ORDER — BUPIVACAINE-EPINEPHRINE 0.25% -1:200000 IJ SOLN
INTRAMUSCULAR | Status: DC | PRN
  Administered 2017-03-08: 10 mL

## 2017-03-08 MED ORDER — SUGAMMADEX SODIUM 200 MG/2ML IV SOLN
INTRAVENOUS | Status: DC | PRN
  Administered 2017-03-08: 200 mg via INTRAVENOUS

## 2017-03-08 MED ORDER — DEXTROSE 5 % IV SOLN
2.0000 g | Freq: Every day | INTRAVENOUS | Status: DC
  Administered 2017-03-08: 2 g via INTRAVENOUS
  Filled 2017-03-08: qty 2

## 2017-03-08 MED ORDER — PANTOPRAZOLE SODIUM 40 MG IV SOLR: 40.0000 mg | Freq: Every day | INTRAVENOUS | Status: DC

## 2017-03-08 MED ORDER — MORPHINE SULFATE (PF) 4 MG/ML IV SOLN
1.0000 mg | INTRAVENOUS | Status: DC | PRN
  Administered 2017-03-08 – 2017-03-09 (×4): 4 mg via INTRAVENOUS
  Filled 2017-03-08 (×4): qty 1

## 2017-03-08 MED ORDER — PROPOFOL 10 MG/ML IV BOLUS
INTRAVENOUS | Status: AC
  Filled 2017-03-08: qty 20

## 2017-03-08 MED ORDER — PHENYLEPHRINE 40 MCG/ML (10ML) SYRINGE FOR IV PUSH (FOR BLOOD PRESSURE SUPPORT)
PREFILLED_SYRINGE | INTRAVENOUS | Status: AC
  Filled 2017-03-08: qty 10

## 2017-03-08 MED ORDER — ROCURONIUM BROMIDE 100 MG/10ML IV SOLN
INTRAVENOUS | Status: DC | PRN
  Administered 2017-03-08: 30 mg via INTRAVENOUS

## 2017-03-08 MED ORDER — KCL IN DEXTROSE-NACL 20-5-0.45 MEQ/L-%-% IV SOLN
INTRAVENOUS | Status: DC
  Administered 2017-03-08: 09:00:00 via INTRAVENOUS
  Filled 2017-03-08: qty 1000

## 2017-03-08 MED ORDER — FENTANYL CITRATE (PF) 100 MCG/2ML IJ SOLN
INTRAMUSCULAR | Status: AC
  Filled 2017-03-08: qty 2

## 2017-03-08 MED ORDER — ONDANSETRON HCL 4 MG/2ML IJ SOLN
4.0000 mg | Freq: Four times a day (QID) | INTRAMUSCULAR | Status: DC | PRN
  Administered 2017-03-08: 4 mg via INTRAVENOUS

## 2017-03-08 MED ORDER — ONDANSETRON HCL 4 MG/2ML IJ SOLN
4.0000 mg | Freq: Once | INTRAMUSCULAR | Status: AC
  Administered 2017-03-08: 4 mg via INTRAVENOUS
  Filled 2017-03-08: qty 2

## 2017-03-08 MED ORDER — HYDROMORPHONE HCL 1 MG/ML IJ SOLN
INTRAMUSCULAR | Status: AC
  Filled 2017-03-08: qty 1

## 2017-03-08 MED ORDER — PROCHLORPERAZINE EDISYLATE 5 MG/ML IJ SOLN: 5.0000 mg | Freq: Four times a day (QID) | INTRAMUSCULAR | Status: DC | PRN

## 2017-03-08 SURGICAL SUPPLY — 34 items
ADH SKN CLS APL DERMABOND .7 (GAUZE/BANDAGES/DRESSINGS) ×1
APPLIER CLIP 5 13 M/L LIGAMAX5 (MISCELLANEOUS) ×2
APR CLP MED LRG 5 ANG JAW (MISCELLANEOUS) ×1
BAG SPEC RTRVL LRG 6X4 10 (ENDOMECHANICALS) ×1
CANISTER SUCT 3000ML PPV (MISCELLANEOUS) ×2 IMPLANT
CHLORAPREP W/TINT 26ML (MISCELLANEOUS) ×2 IMPLANT
CLIP APPLIE 5 13 M/L LIGAMAX5 (MISCELLANEOUS) ×1 IMPLANT
COVER SURGICAL LIGHT HANDLE (MISCELLANEOUS) ×2 IMPLANT
DERMABOND ADVANCED (GAUZE/BANDAGES/DRESSINGS) ×1
DERMABOND ADVANCED .7 DNX12 (GAUZE/BANDAGES/DRESSINGS) ×1 IMPLANT
ELECT REM PT RETURN 9FT ADLT (ELECTROSURGICAL) ×2
ELECTRODE REM PT RTRN 9FT ADLT (ELECTROSURGICAL) ×1 IMPLANT
GLOVE SURG SIGNA 7.5 PF LTX (GLOVE) ×2 IMPLANT
GOWN STRL REUS W/ TWL LRG LVL3 (GOWN DISPOSABLE) ×2 IMPLANT
GOWN STRL REUS W/ TWL XL LVL3 (GOWN DISPOSABLE) ×1 IMPLANT
GOWN STRL REUS W/TWL LRG LVL3 (GOWN DISPOSABLE) ×4
GOWN STRL REUS W/TWL XL LVL3 (GOWN DISPOSABLE) ×2
KIT BASIN OR (CUSTOM PROCEDURE TRAY) ×2 IMPLANT
KIT ROOM TURNOVER OR (KITS) ×2 IMPLANT
NS IRRIG 1000ML POUR BTL (IV SOLUTION) ×2 IMPLANT
PAD ARMBOARD 7.5X6 YLW CONV (MISCELLANEOUS) ×2 IMPLANT
POUCH SPECIMEN RETRIEVAL 10MM (ENDOMECHANICALS) ×2 IMPLANT
SCISSORS LAP 5X35 DISP (ENDOMECHANICALS) ×2 IMPLANT
SET IRRIG TUBING LAPAROSCOPIC (IRRIGATION / IRRIGATOR) ×2 IMPLANT
SLEEVE ENDOPATH XCEL 5M (ENDOMECHANICALS) ×4 IMPLANT
SPECIMEN JAR SMALL (MISCELLANEOUS) ×2 IMPLANT
SUT MNCRL AB 4-0 PS2 18 (SUTURE) ×2 IMPLANT
SYR CONTROL 10ML LL (SYRINGE) ×1 IMPLANT
TOWEL OR 17X24 6PK STRL BLUE (TOWEL DISPOSABLE) ×2 IMPLANT
TOWEL OR 17X26 10 PK STRL BLUE (TOWEL DISPOSABLE) ×1 IMPLANT
TRAY LAPAROSCOPIC MC (CUSTOM PROCEDURE TRAY) ×2 IMPLANT
TROCAR XCEL BLUNT TIP 100MML (ENDOMECHANICALS) ×2 IMPLANT
TROCAR XCEL NON-BLD 5MMX100MML (ENDOMECHANICALS) ×2 IMPLANT
TUBING INSUFFLATION (TUBING) ×2 IMPLANT

## 2017-03-08 NOTE — ED Notes (Signed)
Belongings consisting of clothing, jewelry and cell phone with charger given to significant other. Pt with suitcase on arrival, inventory not completed by this is with significant other as well.

## 2017-03-08 NOTE — Anesthesia Procedure Notes (Signed)
Procedure Name: Intubation Date/Time: 03/08/2017 10:22 AM Performed by: Shirlyn Goltz Pre-anesthesia Checklist: Patient identified, Emergency Drugs available, Suction available and Patient being monitored Patient Re-evaluated:Patient Re-evaluated prior to induction Oxygen Delivery Method: Circle system utilized Preoxygenation: Pre-oxygenation with 100% oxygen Induction Type: IV induction, Rapid sequence and Cricoid Pressure applied Laryngoscope Size: Mac and 3 Grade View: Grade I Tube type: Oral Tube size: 7.0 mm Number of attempts: 1 Airway Equipment and Method: Stylet Placement Confirmation: ETT inserted through vocal cords under direct vision,  positive ETCO2 and breath sounds checked- equal and bilateral Secured at: 21 cm Tube secured with: Tape Dental Injury: Teeth and Oropharynx as per pre-operative assessment

## 2017-03-08 NOTE — ED Provider Notes (Addendum)
TIME SEEN: 1:56 AM  CHIEF COMPLAINT: Abdominal Pain   By signing my name below, I, Ny'Kea Lewis, attest that this documentation has been prepared under the direction and in the presence of Salvator Seppala, Layla Maw, DO. Electronically Signed: Karren Cobble, ED Scribe. 03/08/17. 2:16 AM.  HPI:  Sandra Moody is a 46 y.o. female who presents to the Emergency Department complaining of gradaully worsening, persistent, RUQ abdominal pain. She notes associated fever (100.5) last week, nausea, vomiting (last time was earlier today), and diarrhea. She describes her abdominal pain as "feeling like I'm being shot with a nail gun". Pt states Dr. Loreta Ave with GI ordered for her to have an ultrasound and endoscopy her results showed "my gallbladder is only functioning of 15%". States she had an endoscopy last week that showed a hiatal hernia. She states that she has a consult for surgery on 8/28, but her symptoms are worsening.  She was not prescribed any medication for pain management. She states her LMP was 7/28. PSHx of tubal ligation.   ROS: See HPI Constitutional: + fever  Eyes: no drainage  ENT: no runny nose   Cardiovascular:  no chest pain  Resp: no SOB  GI: + vomiting, nausea, diarrhea, and abdominal pain  GU: no dysuria Integumentary: no rash  Allergy: no hives  Musculoskeletal: no leg swelling  Neurological: no slurred speech ROS otherwise negative  PAST MEDICAL HISTORY/PAST SURGICAL HISTORY:  No past medical history on file.  MEDICATIONS:  Prior to Admission medications   Not on File    ALLERGIES:  Allergies not on file  SOCIAL HISTORY:  Social History  Substance Use Topics  . Smoking status: Not on file  . Smokeless tobacco: Not on file  . Alcohol use Not on file    FAMILY HISTORY: No family history on file.  EXAM: BP (!) 131/91 (BP Location: Right Arm)   Pulse 70   Temp 98.3 F (36.8 C) (Oral)   Resp 20   SpO2 99%  CONSTITUTIONAL: Alert and oriented and responds appropriately  to questions. Well-appearing; well-nourished, appears uncomfortable, afebrile HEAD: Normocephalic EYES: Conjunctivae clear, PERRL ENT: normal nose; no rhinorrhea; moist mucous membranes; pharynx without lesions noted NECK: Supple, no meningismus, no LAD  CARD: RRR; S1 and S2 appreciated; no murmurs, no clicks, no rubs, no gallops RESP: Normal chest excursion without splinting or tachypnea; breath sounds clear and equal bilaterally; no wheezes, no rhonchi, no rales, no hypoxia or respiratory distress, speaking full sentences ABD/GI: Normal bowel sounds; non-distended; soft, tender in the right upper quadrant without a positive Murphy sign, no hepatosplenomegaly, no peritoneal signs, no rebound, no guarding BACK:  The back appears normal and is non-tender to palpation, there is no CVA tenderness EXT: Normal ROM in all joints; non-tender to palpation; no edema; normal capillary refill; no cyanosis    SKIN: Normal color for age and race; warm NEURO: Moves all extremities equally PSYCH: The patient's mood and manner are appropriate. Grooming and personal hygiene are appropriate.  MEDICAL DECISION MAKING: Patient here with complaints of right upper quadrant and umbilical abdominal pain. States she was told that she has poorly functioning gallbladder. States symptoms are progressively worsening. Had fever last week and has had vomiting. We'll obtain labs, repeat ultrasound of her gallbladder to evaluate for cholecystitis. She is requesting that she have a cholecystectomy today. Patient and husband are demanding a surgical consult in the ER. We'll give her IV fluids, pain and nausea medication.  ED PROGRESS: Patient's labs are unremarkable. No leukocytosis.  Normal LFTs and lipase. No fever here. Right upper quadrant ultrasound shows no cholecystitis or cholelithiasis. Normal-appearing ducts. Patient and husband are insistent that she needs emergent cholecystectomy. I have consulted Dr. Lindie SpruceWyatt with general  surgery and appreciate his help. He agrees that patient does not need emergent cholecystectomy. He and that she call the surgical office to see if she can move her appointment up. It is scheduled currently for August 28. I will discharge her with pain and nausea medications that she can try to control her symptoms at home. I have discussed return precautions with patient and husband. They seem to be upset with this plan but have tried to explain to them several times at this is not a reason for admission. Her pain is been very well controlled here with only 1 dose of pain medication. We'll give her a second dose prior to discharge. She's not had any vomiting here. We have had a lengthy discussion that a cholecystectomy may not fix all of her abdominal pain as she does seem to have multiple other issues including a hiatal hernia and endometriosis.  6:00 AM  Dr. Lindie SpruceWyatt with general surgery has called back and will see the patient in consult in the emergency department. We appreciate his help.  6:20 AM  Dr. Lindie SpruceWyatt has seen patient and will admit given patient and has been upset with plan to go home.  Dr. Rayburn MaBlackmon will take patient to surgery today.  I reviewed all nursing notes, vitals, pertinent previous records, EKGs, lab and urine results, imaging (as available).    Cadan Maggart, Layla MawKristen N, DO 03/08/17 [phone]

## 2017-03-08 NOTE — Op Note (Signed)
Laparoscopic Cholecystectomy Procedure Note  Indications: This patient presents with symptomatic gallbladder disease and will undergo laparoscopic cholecystectomy.  Pre-operative Diagnosis: biliary dyskinesia  Post-operative Diagnosis: Same  Surgeon: Abigail MiyamotoBLACKMAN,Burney Calzadilla A   Assistants: 0  Anesthesia: General endotracheal anesthesia  ASA Class: 1  Procedure Details  The patient was seen again in the Holding Room. The risks, benefits, complications, treatment options, and expected outcomes were discussed with the patient. The possibilities of reaction to medication, pulmonary aspiration, perforation of viscus, bleeding, recurrent infection, finding a normal gallbladder, the need for additional procedures, failure to diagnose a condition, the possible need to convert to an open procedure, and creating a complication requiring transfusion or operation were discussed with the patient. The likelihood of improving the patient's symptoms with return to their baseline status is good.  The patient and/or family concurred with the proposed plan, giving informed consent. The site of surgery properly noted. The patient was taken to Operating Room, identified as Sandra Moody and the procedure verified as Laparoscopic Cholecystectomy with Intraoperative Cholangiogram. A Time Out was held and the above information confirmed.  Prior to the induction of general anesthesia, antibiotic prophylaxis was administered. General endotracheal anesthesia was then administered and tolerated well. After the induction, the abdomen was prepped with Chloraprep and draped in sterile fashion. The patient was positioned in the supine position.  Local anesthetic agent was injected into the skin near the umbilicus and an incision made. We dissected down to the abdominal fascia with blunt dissection.  The fascia was incised vertically and we entered the peritoneal cavity bluntly.  A pursestring suture of 0-Vicryl was placed around the  fascial opening.  The Hasson cannula was inserted and secured with the stay suture.  Pneumoperitoneum was then created with CO2 and tolerated well without any adverse changes in the patient's vital signs. A 5-mm port was placed in the subxiphoid position.  Two 5-mm ports were placed in the right upper quadrant. All skin incisions were infiltrated with a local anesthetic agent before making the incision and placing the trocars.   We positioned the patient in reverse Trendelenburg, tilted slightly to the patient's left.  The gallbladder was identified, the fundus grasped and retracted cephalad.  It was fairly normal in appearance with no adhesions and no other intra-abdominal pathology was seen. The infundibulum was grasped and retracted laterally, exposing the peritoneum overlying the triangle of Calot. This was then divided and exposed in a blunt fashion. The cystic duct was clearly identified and bluntly dissected circumferentially. A critical view of the cystic duct and cystic artery was obtained.  The cystic duct was then ligated with clips and divided. The cystic artery was, dissected free, ligated with clips and divided as well.   The gallbladder was dissected from the liver bed in retrograde fashion with the electrocautery. The gallbladder was removed and placed in an Endocatch sac. The liver bed was irrigated and inspected. Hemostasis was achieved with the electrocautery. Copious irrigation was utilized and was repeatedly aspirated until clear.  The gallbladder and Endocatch sac were then removed through the umbilical port site.  The pursestring suture was used to close the umbilical fascia.    We again inspected the right upper quadrant for hemostasis.  Pneumoperitoneum was released as we removed the trocars.  4-0 Monocryl was used to close the skin.   Skin glue was then applied. The patient was then extubated and brought to the recovery room in stable condition. Instrument, sponge, and needle counts  were correct at closure and  at the conclusion of the case.   Findings: Fairly normal appearing gallbladder  Estimated Blood Loss: Minimal         Drains: 0         Specimens: Gallbladder           Complications: None; patient tolerated the procedure well.         Disposition: PACU - hemodynamically stable.         Condition: stable

## 2017-03-08 NOTE — Transfer of Care (Signed)
Immediate Anesthesia Transfer of Care Note  Patient: Sandra Moody  Procedure(s) Performed: Procedure(s): LAPAROSCOPIC CHOLECYSTECTOMY (N/A)  Patient Location: PACU  Anesthesia Type:General  Level of Consciousness: awake, alert , oriented and patient cooperative  Airway & Oxygen Therapy: Patient Spontanous Breathing and Patient connected to nasal cannula oxygen  Post-op Assessment: Report given to RN and Post -op Vital signs reviewed and stable  Post vital signs: Reviewed and stable  Last Vitals:  Vitals:   03/08/17 0700 03/08/17 1115  BP: 126/80 (!) (P) 166/96  Pulse: 78 82  Resp:  13  Temp:  36.4 C  SpO2: 100% 98%    Last Pain:  Vitals:   03/08/17 1115  TempSrc:   PainSc: (P) 0-No pain         Complications: No apparent anesthesia complications

## 2017-03-08 NOTE — Anesthesia Postprocedure Evaluation (Signed)
Anesthesia Post Note  Patient: Sandra Moody  Procedure(s) Performed: Procedure(s) (LRB): LAPAROSCOPIC CHOLECYSTECTOMY (N/A)     Patient location during evaluation: PACU Anesthesia Type: General Level of consciousness: awake and alert Pain management: pain level controlled Vital Signs Assessment: post-procedure vital signs reviewed and stable Respiratory status: spontaneous breathing, nonlabored ventilation and respiratory function stable Cardiovascular status: blood pressure returned to baseline and stable Postop Assessment: no signs of nausea or vomiting Anesthetic complications: no    Last Vitals:  Vitals:   03/08/17 1200 03/08/17 1215  BP: (!) 161/85 (!) 163/87  Pulse: 62 63  Resp: 14 12  Temp:    SpO2: 99% 100%    Last Pain:  Vitals:   03/08/17 1145  TempSrc:   PainSc: 0-No pain                 Beryle Lathehomas E Rydge [place]

## 2017-03-08 NOTE — H&P (Signed)
Sandra Moody is an 46 y.o. female.   Chief Complaint: Abdominal pain HPI: This problem has been going on for over a year.  An extensive work up has ensued and the only "positive" findings was a HIDA scan about one week ago which demonstrated an EF of 15%.  She has been told that a cholecystectomy will resolve her pain.   No past medical history on file.  No past surgical history on file.  No family history on file. Social History:  has no tobacco, alcohol, and drug history on file.  Allergies: No Known Allergies   (Not in a hospital admission)  Results for orders placed or performed during the hospital encounter of 03/08/17 (from the past 48 hour(s))  CBC with Differential     Status: None   Collection Time: 03/08/17  2:10 AM  Result Value Ref Range   WBC 5.7 4.0 - 10.5 K/uL   RBC 4.07 3.87 - 5.11 MIL/uL   Hemoglobin 12.2 12.0 - 15.0 g/dL   HCT 37.3 36.0 - 46.0 %   MCV 91.6 78.0 - 100.0 fL   MCH 30.0 26.0 - 34.0 pg   MCHC 32.7 30.0 - 36.0 g/dL   RDW 12.7 11.5 - 15.5 %   Platelets 335 150 - 400 K/uL   Neutrophils Relative % 44 %   Neutro Abs 2.5 1.7 - 7.7 K/uL   Lymphocytes Relative 45 %   Lymphs Abs 2.6 0.7 - 4.0 K/uL   Monocytes Relative 10 %   Monocytes Absolute 0.6 0.1 - 1.0 K/uL   Eosinophils Relative 1 %   Eosinophils Absolute 0.1 0.0 - 0.7 K/uL   Basophils Relative 0 %   Basophils Absolute 0.0 0.0 - 0.1 K/uL  Comprehensive metabolic panel     Status: Abnormal   Collection Time: 03/08/17  2:10 AM  Result Value Ref Range   Sodium 138 135 - 145 mmol/L   Potassium 4.6 3.5 - 5.1 mmol/L   Chloride 104 101 - 111 mmol/L   CO2 26 22 - 32 mmol/L   Glucose, Bld 98 65 - 99 mg/dL   BUN 5 (L) 6 - 20 mg/dL   Creatinine, Ser 0.96 0.44 - 1.00 mg/dL   Calcium 8.9 8.9 - 10.3 mg/dL   Total Protein 6.2 (L) 6.5 - 8.1 g/dL   Albumin 3.5 3.5 - 5.0 g/dL   AST 30 15 - 41 U/L   ALT 15 14 - 54 U/L   Alkaline Phosphatase 39 38 - 126 U/L   Total Bilirubin 1.0 0.3 - 1.2 mg/dL   GFR  calc non Af Amer >60 >60 mL/min   GFR calc Af Amer >60 >60 mL/min    Comment: (NOTE) The eGFR has been calculated using the CKD EPI equation. This calculation has not been validated in all clinical situations. eGFR's persistently <60 mL/min signify possible Chronic Kidney Disease.    Anion gap 8 5 - 15  Lipase, blood     Status: None   Collection Time: 03/08/17  2:10 AM  Result Value Ref Range   Lipase 25 11 - 51 U/L  I-Stat Beta hCG blood, ED ([facility], [facility], [facility] only)     Status: None   Collection Time: 03/08/17  2:38 AM  Result Value Ref Range   I-stat hCG, quantitative <5.0 <5 mIU/mL   Comment 3            Comment:   GEST. AGE      CONC.  (mIU/mL)   <=1 WEEK  5 - 50     2 WEEKS       50 - 500     3 WEEKS       100 - 10,000     4 WEEKS     1,000 - 30,000        FEMALE AND NON-PREGNANT FEMALE:     LESS THAN 5 mIU/mL    US Abdomen Limited Ruq  Result Date: 03/08/2017 CLINICAL DATA:  Chronic right upper quadrant abdominal pain. Initial encounter. EXAM: ULTRASOUND ABDOMEN LIMITED RIGHT UPPER QUADRANT COMPARISON:  Abdominal ultrasound performed 02/27/2017 FINDINGS: Gallbladder: No gallstones or wall thickening visualized. No sonographic Murphy sign noted by sonographer. Common bile duct: Diameter: 0.2 cm, within normal limits in caliber. Liver: No focal lesion identified. Within normal limits in parenchymal echogenicity. IMPRESSION: Unremarkable ultrasound of the right upper quadrant. Electronically Signed   By: Garald Balding M.D.   On: 03/08/2017 05:02    Review of Systems  Constitutional: Negative for chills and fever.  Gastrointestinal: Positive for abdominal pain, nausea and vomiting.  All other systems reviewed and are negative.   Blood pressure 113/77, pulse 79, temperature 98.3 F (36.8 C), temperature source Oral, resp. rate 20, SpO2 99 %. Physical Exam  Vitals reviewed. Constitutional: She is oriented to person, place, and time. She appears well-developed and  well-nourished.  HENT:  Head: Normocephalic and atraumatic.  Eyes: Pupils are equal, round, and reactive to light. Conjunctivae and EOM are normal.  Neck: Normal range of motion. Neck supple.  Cardiovascular: Normal rate and regular rhythm.   Murmur (at the LLSB, very soft) heard.  Systolic murmur is present with a grade of 1/6  Respiratory: Effort normal and breath sounds normal.  GI: Soft. Bowel sounds are normal. There is tenderness (epigastric). There is no rigidity, no rebound and no guarding.  Musculoskeletal: Normal range of motion.  Neurological: She is alert and oriented to person, place, and time. She has normal reflexes.  Skin: Skin is warm and dry.  Psychiatric: Her speech is normal. Thought content normal. Her mood appears anxious. She is aggressive. Cognition and memory are normal. She expresses impulsivity.     Assessment/Plan Symptomatic biliary dyskinesia  The patient's pain is out of proportion to her physical findings or her radiological findings.  She did not have a sonographic Murphy's sign, and she did not have pain or tenderness with cholecystokinin on HIDA scan done about one week ago.    I warned that patient that for biliary dyskinesia and possible chronic cholecystitis that cholecystectomy may not completely resolve her symptoms   I have discussed the patient with the surgeon on call and he is willing to see the patient and perform a cholecystectomy.  I have only briefly talked to her about the procedure, but did not completely discuss all the risks and possible benefits.  Judeth Horn, MD 03/08/2017, 6:40 AM

## 2017-03-08 NOTE — Progress Notes (Signed)
Patient ID: Sandra Moody, female   DOB: Jul 16, 1971, 46 y.o.   MRN: [id number]   Patient with biliary dyskinesia and possible chronic cholecystitis.  She is eager and wants to proceed with a lap chole given her symptoms. I discussed the procedure in detail.  We discussed the risks and benefits of a laparoscopic cholecystectomy and possible cholangiogram including, but not limited to bleeding, infection, injury to surrounding structures such as the intestine or liver, bile leak, retained gallstones, need to convert to an open procedure, prolonged diarrhea, blood clots such as  DVT, common bile duct injury, anesthesia risks, and possible need for additional procedures.  We also discussed the possibility that the surgery may not relieve her all her symptoms. We discussed the typical post-operative recovery course.

## 2017-03-08 NOTE — Anesthesia Preprocedure Evaluation (Signed)
Anesthesia Evaluation  Patient identified by MRN, date of birth, ID band Patient awake    Reviewed: Allergy & Precautions, NPO status , Patient's Chart, lab work & pertinent test results  Airway Mallampati: II  TM Distance: >3 FB Neck ROM: Full    Dental no notable dental hx. (+) Dental Advisory Given   Pulmonary neg pulmonary ROS,    Pulmonary exam normal breath sounds clear to auscultation       Cardiovascular negative cardio ROS Normal cardiovascular exam Rhythm:Regular Rate:Normal     Neuro/Psych negative neurological ROS  negative psych ROS   GI/Hepatic negative GI ROS, Neg liver ROS,   Endo/Other  negative endocrine ROS  Renal/GU negative Renal ROS  negative genitourinary   Musculoskeletal negative musculoskeletal ROS (+)   Abdominal   Peds negative pediatric ROS (+)  Hematology negative hematology ROS (+)   Anesthesia Other Findings   Reproductive/Obstetrics negative OB ROS                             Anesthesia Physical Anesthesia Plan  ASA: I  Anesthesia Plan: General   Post-op Pain Management:    Induction: Intravenous  PONV Risk Score and Plan: 3 and Ondansetron, Dexamethasone, Midazolam and Scopolamine patch - Pre-op  Airway Management Planned: Nasal Cannula  Additional Equipment:   Intra-op Plan:   Post-operative Plan: Extubation in OR  Informed Consent: I have reviewed the patients History and Physical, chart, labs and discussed the procedure including the risks, benefits and alternatives for the proposed anesthesia with the patient or authorized representative who has indicated his/her understanding and acceptance.   Dental advisory given  Plan Discussed with: CRNA  Anesthesia Plan Comments:         Anesthesia Quick Evaluation

## 2017-03-09 ENCOUNTER — Encounter (HOSPITAL_COMMUNITY): Payer: Self-pay | Admitting: Surgery

## 2017-03-09 ENCOUNTER — Encounter: Payer: Self-pay | Admitting: Family Medicine

## 2017-03-09 MED ORDER — ALUM & MAG HYDROXIDE-SIMETH 200-200-20 MG/5ML PO SUSP
30.0000 mL | ORAL | Status: DC | PRN
  Administered 2017-03-09: 30 mL via ORAL

## 2017-03-09 MED ORDER — HYDROCODONE-ACETAMINOPHEN 5-325 MG PO TABS: 1.0000 | ORAL_TABLET | ORAL | 0 refills | Status: DC | PRN

## 2017-03-09 MED ORDER — ALUM & MAG HYDROXIDE-SIMETH 200-200-20 MG/5ML PO SUSP
ORAL | Status: AC
  Filled 2017-03-09: qty 30

## 2017-03-09 NOTE — Discharge Summary (Signed)
[facility] Discharge Summary   Patient ID: Sandra Moody MRN: [id number] DOB/AGE: 44-Oct-1972 44 y.o.  Admit date: 03/08/2017 Discharge date: 03/09/2017  Admitting Diagnosis: Biliary dyskinesia  Discharge Diagnosis Patient Active Problem List   Diagnosis Date Noted  . Biliary dyskinesia 03/08/2017    Consultants none  Imaging: [place] Abdomen Limited Ruq  Result Date: 03/08/2017 CLINICAL DATA:  Chronic right upper quadrant abdominal pain. Initial encounter. EXAM: ULTRASOUND ABDOMEN LIMITED RIGHT UPPER QUADRANT COMPARISON:  Abdominal ultrasound performed 02/27/2017 FINDINGS: Gallbladder: No gallstones or wall thickening visualized. No sonographic Murphy sign noted by sonographer. Common bile duct: Diameter: 0.2 cm, within normal limits in caliber. Liver: No focal lesion identified. Within normal limits in parenchymal echogenicity. IMPRESSION: Unremarkable ultrasound of the right upper quadrant. Electronically Signed   By: Roanna RaiderJeffery  Chang M.D.   On: 03/08/2017 05:02    Procedures Dr. Magnus IvanBlackman (03/08/17) - Laparoscopic Cholecystectomy    Hospital Course:  Sandra Brightalana Jasko is a 46 year old female who [facility].  Workup showed biliary dyskinesia.  Patient was admitted and underwent procedure listed above.  Tolerated procedure well and was transferred to the floor.  Diet was advanced as tolerated.  On POD#1, the patient was voiding well, tolerating diet, ambulating well, pain well controlled, vital signs stable, incisions c/d/i and felt stable for discharge home.  Patient will follow up in our office in 2 weeks and knows to call with questions or concerns.  She will call to confirm appointment date/time.    Patient was discharged in good condition.  The [place] Substance controlled database was reviewed prior to prescribing narcotic pain medication to this patient.  Physical Exam: General:  Alert, NAD, pleasant, cooperative Cardio: RRR, S1 &  S2 normal, no murmur, rubs, gallops Resp: Effort normal, lungs CTA bilaterally, no wheezes, rales, rhonchi Abd:  Soft, ND, normal bowel sounds, Mild tenderness around umbilical incision, incisions with glue intact without surrounding erythema or drainage, mild ecchymosis inferior to umbilical incision  Skin: no rashes noted, warm and dry  Allergies as of 03/09/2017      Reactions   Shellfish Allergy Anaphylaxis      Medication List    TAKE these medications   HYDROcodone-acetaminophen 5-325 MG tablet Commonly known as:  NORCO/VICODIN Take 1-2 tablets by mouth every 4 (four) hours as needed.        Follow-up Information    [facility]. Go on 03/22/2017.   Why:  Your appointment is 03/22/17 at 3:30 pm. Please arrive 30 minutes prior to yoru apponitment to check in and fill out necessary paperwork. Contact information: [address] [place] [phone]          Signed: Joyce CopaJessica L [facility] 03/09/2017, 9:07 AM Pager: [phone] Consults: [phone] Mon-Fri 7:00 am-4:30 pm Sat-Sun 7:00 am-11:30 am

## 2017-03-09 NOTE — Progress Notes (Signed)
Patient discharged to home with instructions and prescription. 

## 2017-03-16 ENCOUNTER — Ambulatory Visit (INDEPENDENT_AMBULATORY_CARE_PROVIDER_SITE_OTHER): Payer: Managed Care, Other (non HMO) | Admitting: Physician Assistant

## 2017-03-16 ENCOUNTER — Encounter: Payer: Self-pay | Admitting: Physician Assistant

## 2017-03-16 VITALS — BP 130/90 | HR 70 | Temp 99.0°F | Resp 16 | Ht 64.5 in | Wt 144.2 lb

## 2017-03-16 DIAGNOSIS — R519 Headache, unspecified: Secondary | ICD-10-CM

## 2017-03-16 DIAGNOSIS — R51 Headache: Secondary | ICD-10-CM

## 2017-03-16 DIAGNOSIS — R6883 Chills (without fever): Secondary | ICD-10-CM | POA: Diagnosis not present

## 2017-03-16 LAB — POCT CBC
Granulocyte percent: 54.9 %G (ref 37–80)
HCT, POC: 35.5 % — AB (ref 37.7–47.9)
Hemoglobin: 11.9 g/dL — AB (ref 12.2–16.2)
Lymph, poc: 2.6 (ref 0.6–3.4)
MCH, POC: 29.9 pg (ref 27–31.2)
MCHC: 33.6 g/dL (ref 31.8–35.4)
MCV: 88.9 fL (ref 80–97)
MID (cbc): 0.5 (ref 0–0.9)
MPV: 7.8 fL (ref 0–99.8)
POC Granulocyte: 3.7 (ref 2–6.9)
POC LYMPH PERCENT: 38.3 %L (ref 10–50)
POC MID %: 6.8 %M (ref 0–12)
Platelet Count, POC: 319 10*3/uL (ref 142–424)
RBC: 3.99 M/uL — AB (ref 4.04–5.48)
RDW, POC: 12.9 %
WBC: 6.7 10*3/uL (ref 4.6–10.2)

## 2017-03-16 MED ORDER — BUTALBITAL-APAP-CAFFEINE 50-325-40 MG PO TABS: 1.0000 | ORAL_TABLET | Freq: Four times a day (QID) | ORAL | 0 refills | Status: DC | PRN

## 2017-03-16 NOTE — Patient Instructions (Addendum)
Stay well hydrated. Drink 32-64 oz of water daily. Be sure you are eating meals on schedule, several times daily. Eat a well-balanced diet.  Stretch your neck muscles daily.  Try to notice if there are any triggers for your headaches.  Avoid caffeine.   Come back if you are not improving in 3-5 days.   Thank you for coming in today. I hope you feel we met your needs.  Feel free to call PCP if you have any questions or further requests.  Please consider signing up for MyChart if you do not already have it, as this is a great way to communicate with me.  Best,  Whitney McVey, PA-C  Acetaminophen; Butalbital; Caffeine tablets or capsules What is this medicine? ACETAMINOPHEN; BUTALBITAL; CAFFEINE (a set a MEE noe fen; byoo TAL bi tal; KAF een) is a pain reliever. It is used to treat tension headaches. This medicine may be used for other purposes; ask your health care provider or pharmacist if you have questions. COMMON BRAND NAME(S): Alagesic, Americet, Anolor-300, Arcet, BAC, CAPACET, Dolgic Plus, Esgic, Esgic Plus, Ezol, Fioricet, [employer], Medigesic, [place], Pacaps, Phrenilin Forte, Repan, Grant, Triad, Zebutal What should I tell my health care provider before I take this medicine? They need to know if you have any of these conditions: -drug abuse or addiction -heart or circulation problems -if you often drink alcohol -kidney disease or problems going to the bathroom -liver disease -lung disease, asthma, or breathing problems -porphyria -an unusual or allergic reaction to acetaminophen, butalbital or other barbiturates, caffeine, other medicines, foods, dyes, or preservatives -pregnant or trying to get pregnant -breast-feeding How should I use this medicine? Take this medicine by mouth with a full glass of water. Follow the directions on the prescription label. If the medicine upsets your stomach, take the medicine with food or milk. Do not take more than you are told to  take. Talk to your pediatrician regarding the use of this medicine in children. Special care may be needed. Overdosage: If you think you have taken too much of this medicine contact a poison control center or emergency room at once. NOTE: This medicine is only for you. Do not share this medicine with others. What if I miss a dose? If you miss a dose, take it as soon as you can. If it is almost time for your next dose, take only that dose. Do not take double or extra doses. What may interact with this medicine? -alcohol or medicines that contain alcohol -antidepressants, especially MAOIs like isocarboxazid, phenelzine, tranylcypromine, and selegiline -antihistamines -benzodiazepines -carbamazepine -isoniazid -medicines for pain like pentazocine, buprenorphine, butorphanol, nalbuphine, tramadol, and propoxyphene -muscle relaxants -naltrexone -phenobarbital, phenytoin, and fosphenytoin -phenothiazines like perphenazine, thioridazine, chlorpromazine, mesoridazine, fluphenazine, prochlorperazine, promazine, and trifluoperazine -voriconazole This list may not describe all possible interactions. Give your health care provider a list of all the medicines, herbs, non-prescription drugs, or dietary supplements you use. Also tell them if you smoke, drink alcohol, or use illegal drugs. Some items may interact with your medicine. What should I watch for while using this medicine? Tell your doctor or health care professional if your pain does not go away, if it gets worse, or if you have new or a different type of pain. You may develop tolerance to the medicine. Tolerance means that you will need a higher dose of the medicine for pain relief. Tolerance is normal and is expected if you take the medicine for a long time. Do not suddenly stop taking your  medicine because you may develop a severe reaction. Your body becomes used to the medicine. This does NOT mean you are addicted. Addiction is a behavior  related to getting and using a drug for a non-medical reason. If you have pain, you have a medical reason to take pain medicine. Your doctor will tell you how much medicine to take. If your doctor wants you to stop the medicine, the dose will be slowly lowered over time to avoid any side effects. You may get drowsy or dizzy when you first start taking the medicine or change doses. Do not drive, use machinery, or do anything that may be dangerous until you know how the medicine affects you. Stand or sit up slowly. Do not take other medicines that contain acetaminophen with this medicine. Always read labels carefully. If you have questions, ask your doctor or pharmacist. If you take too much acetaminophen get medical help right away. Too much acetaminophen can be very dangerous and cause liver damage. Even if you do not have symptoms, it is important to get help right away. What side effects may I notice from receiving this medicine? Side effects that you should report to your doctor or health care professional as soon as possible: -allergic reactions like skin rash, itching or hives, swelling of the face, lips, or tongue -breathing problems -confusion -feeling faint or lightheaded, falls -redness, blistering, peeling or loosening of the skin, including inside the mouth -seizure -stomach pain -yellowing of the eyes or skin Side effects that usually do not require medical attention (report to your doctor or health care professional if they continue or are bothersome): -constipation -nausea, vomiting This list may not describe all possible side effects. Call your doctor for medical advice about side effects. You may report side effects to FDA at 1-800-FDA-1088. Where should I keep my medicine? Keep out of the reach of children. This medicine can be abused. Keep your medicine in a safe place to protect it from theft. Do not share this medicine with anyone. Selling or giving away this medicine is  dangerous and against the law. This medicine may cause accidental overdose and death if it taken by other adults, children, or pets. Mix any unused medicine with a substance like cat litter or coffee grounds. Then throw the medicine away in a sealed container like a sealed bag or a coffee can with a lid. Do not use the medicine after the expiration date. Store at room temperature between 15 and 30 degrees C (59 and 86 degrees F). NOTE: This sheet is a summary. It may not cover all possible information. If you have questions about this medicine, talk to your doctor, pharmacist, or health care provider.  2018 Elsevier/Gold Standard (2013-09-12 15:00:25)  General Headache Without Cause A headache is pain or discomfort felt around the head or neck area. The specific cause of a headache may not be found. There are many causes and types of headaches. A few common ones are:  Tension headaches.  Migraine headaches.  Cluster headaches.  Chronic daily headaches.  Follow these instructions at home: Watch your condition for any changes. Take these steps to help with your condition: Managing pain  Take over-the-counter and prescription medicines only as told by your health care provider.  Lie down in a dark, quiet room when you have a headache.  If directed, apply ice to the head and neck area: ? Put ice in a plastic bag. ? Place a towel between your skin and the bag. ?  Leave the ice on for 20 minutes, 2-3 times per day.  Use a heating pad or hot shower to apply heat to the head and neck area as told by your health care provider.  Keep lights dim if bright lights bother you or make your headaches worse. Eating and drinking  Eat meals on a regular schedule.  Limit alcohol use.  Decrease the amount of caffeine you drink, or stop drinking caffeine. General instructions  Keep all follow-up visits as told by your health care provider. This is important.  Keep a headache journal to help  find out what may trigger your headaches. For example, write down: ? What you eat and drink. ? How much sleep you get. ? Any change to your diet or medicines.  Try massage or other relaxation techniques.  Limit stress.  Sit up straight, and do not tense your muscles.  Do not use tobacco products, including cigarettes, chewing tobacco, or e-cigarettes. If you need help quitting, ask your health care provider.  Exercise regularly as told by your health care provider.  Sleep on a regular schedule. Get 7-9 hours of sleep, or the amount recommended by your health care provider. Contact a health care provider if:  Your symptoms are not helped by medicine.  You have a headache that is different from the usual headache.  You have nausea or you vomit.  You have a fever. Get help right away if:  Your headache becomes severe.  You have repeated vomiting.  You have a stiff neck.  You have a loss of vision.  You have problems with speech.  You have pain in the eye or ear.  You have muscular weakness or loss of muscle control.  You lose your balance or have trouble walking.  You feel faint or pass out.  You have confusion. This information is not intended to replace advice given to you by your health care provider. Make sure you discuss any questions you have with your health care provider. Document Released: 07/17/2005 Document Revised: 12/23/2015 Document Reviewed: 11/09/2014 Elsevier Interactive Patient Education  2017 [employer].  IF you received an x-ray today, you will receive an invoice from [facility]. Please contact [facility] at [phone] with questions or concerns regarding your invoice.   IF you received labwork today, you will receive an invoice from [place]. Please contact [facility] at [phone] with questions or concerns regarding your invoice.   Our billing staff will not be able to assist you with questions regarding bills from  these companies.  You will be contacted with the lab results as soon as they are available. The fastest way to get your results is to activate your My Chart account. Instructions are located on the last page of this paperwork. If you have not heard from [place] regarding the results in 2 weeks, please contact this office.

## 2017-03-16 NOTE — Progress Notes (Signed)
Sandra Moody  MRN: [id number] DOB: 03/07/1971  PCP: Dorothyann Peng, MD  Subjective:  Pt is a 46 year old female who presents to clinic for headache, dizziness and nausea x 1 day. Headache started first.   She admits to not drinking any water "I'm not a water girl", but she has been drinking lots of juices - apple, mango, cranberry. She took a hydrocodone last night, did not help. Laying down and sleeping makes it better. This is not the worse headache she has ever had.  She has h/o headaches. Last episode was 4 months ago. She does not take anything for her headaches - "I just power through them".  She had surgery to remove her gallbladder last week. Denies shob, fever, chills, cough, urinary symptoms, aura, visual disturbance, vomiting.    Review of Systems  Constitutional: Negative for chills, diaphoresis, fatigue and fever.  Gastrointestinal: Positive for nausea. Negative for abdominal pain and vomiting.  Musculoskeletal: Positive for neck pain.  Neurological: Positive for dizziness and headaches. Negative for syncope and weakness.    Patient Active Problem List   Diagnosis Date Noted  . Biliary dyskinesia 03/08/2017  . Asthma with acute exacerbation 10/04/2015  . Other and unspecified ovarian cyst 06/17/2012  . Pelvic pain in female 06/17/2012  . Bacterial vaginosis 06/17/2012    Current Outpatient Prescriptions on File Prior to Visit  Medication Sig Dispense Refill  . albuterol (PROVENTIL HFA;VENTOLIN HFA) 108 (90 Base) MCG/ACT inhaler Inhale 1-2 puffs into the lungs every 6 (six) hours as needed for wheezing. 1 Inhaler 0  . albuterol (PROVENTIL) (2.5 MG/3ML) 0.083% nebulizer solution Inhale 2.5 mg into the lungs as needed.  5  . beclomethasone (QVAR) 80 MCG/ACT inhaler Inhale 1 puff into the lungs 2 (two) times daily.    Marland Kitchen EPIPEN 2-PAK 0.3 MG/0.3ML SOAJ injection Inject 0.3 mg into the muscle as directed.  2  . HYDROcodone-acetaminophen (NORCO/VICODIN) 5-325 MG tablet  Take 1-2 tablets by mouth every 4 (four) hours as needed. 20 tablet 0  . omeprazole (PRILOSEC) 20 MG capsule Take 1 capsule (20 mg total) by mouth daily. 20 capsule 1  . VIORELE 0.15-0.02/0.01 MG (21/5) tablet Take 0.15 mg by mouth daily.  3  . amitriptyline (ELAVIL) 25 MG tablet Take 1 tablet (25 mg total) by mouth at bedtime. (Patient not taking: Reported on 01/25/2017) 30 tablet 1  . amLODipine (NORVASC) 5 MG tablet Take 1 tablet (5 mg total) by mouth daily. (Patient not taking: Reported on 01/25/2017) 30 tablet 1  . butalbital-acetaminophen-caffeine (FIORICET, ESGIC) 50-325-40 MG tablet Take 1-2 tablets by mouth every 6 (six) hours as needed for headache. (Patient not taking: Reported on 01/25/2017) 20 tablet 0  . ondansetron (ZOFRAN ODT) 4 MG disintegrating tablet Take 1 tablet (4 mg total) by mouth every 8 (eight) hours as needed for nausea or vomiting. (Patient not taking: Reported on 03/16/2017) 10 tablet 0  . traMADol (ULTRAM) 50 MG tablet Take 1 every 6 or 8 hours if needed for severe headache (Patient not taking: Reported on 03/16/2017) 12 tablet 0   No current facility-administered medications on file prior to visit.     Allergies  Allergen Reactions  . Ginger Itching and Swelling  . Passion Fruit Flavor [Flavoring Agent] Itching and Swelling  . Peanuts [Peanut Oil] Anaphylaxis    PT ALLERGIC TO ALL TREE NUTS  . Shellfish Allergy Anaphylaxis     Objective:  BP 130/90 (BP Location: Right Arm, Cuff Size: Normal)   Pulse 70  Temp 99 F (37.2 C) (Oral)   Resp 16   Ht 5' 4.5" (1.638 m)   Wt 144 lb 3.2 oz (65.4 kg)   LMP 02/16/2017 (Approximate)   SpO2 100%   BMI 24.37 kg/m   Physical Exam  Constitutional: She is oriented to person, place, and time and well-developed, well-nourished, and in no distress.  Eyes: Pupils are equal, round, and reactive to light. Conjunctivae and EOM are normal.  Neurological: She is alert and oriented to person, place, and time. Gait normal. GCS  score is 15.  Skin: Skin is warm and dry.     4 sites of incision anterior belly. No erythema, induration, drainage. Not TTP. Healing well.    Psychiatric: Mood, memory, affect and judgment normal.   Results for orders placed or performed in visit on 03/16/17  POCT CBC  Result Value Ref Range   WBC 6.7 4.6 - 10.2 K/uL   Lymph, poc 2.6 0.6 - 3.4   POC LYMPH PERCENT 38.3 10 - 50 %L   MID (cbc) 0.5 0 - 0.9   POC MID % 6.8 0 - 12 %M   POC Granulocyte 3.7 2 - 6.9   Granulocyte percent 54.9 37 - 80 %G   RBC 3.99 (A) 4.04 - 5.48 M/uL   Hemoglobin 11.9 (A) 12.2 - 16.2 g/dL   HCT, POC 16.1 (A) 09.6 - 47.9 %   MCV 88.9 80 - 97 fL   MCH, POC 29.9 27 - 31.2 pg   MCHC 33.6 31.8 - 35.4 g/dL   RDW, POC 04.5 %   Platelet Count, POC 319 142 - 424 K/uL   MPV 7.8 0 - 99.8 fL    Assessment and Plan :  1. Acute intractable headache, unspecified headache type 2. Chills - POCT CBC - butalbital-acetaminophen-caffeine (FIORICET, ESGIC) 50-325-40 MG tablet; Take 1-2 tablets by mouth every 6 (six) hours as needed for headache.  Dispense: 20 tablet; Refill: 0 - Recent cholecystectomy surgery. Not concerned for infection. POCT CBC wnl. Will treat for headache. Advised pt to stay well hydrated and try to drink more water, less sugary drinks, avoid caffeine. RTC in 3-5 days if no improvement of symptoms. She agrees with plan.   Marco Collie, PA-C  Primary Care at [facility] 03/16/2017 5:10 PM

## 2017-04-15 ENCOUNTER — Encounter (HOSPITAL_COMMUNITY): Payer: Self-pay | Admitting: Family Medicine

## 2017-04-15 ENCOUNTER — Ambulatory Visit (HOSPITAL_COMMUNITY)
Admission: EM | Admit: 2017-04-15 | Discharge: 2017-04-15 | Disposition: A | Discharge status: Home / Self Care | Payer: Managed Care, Other (non HMO) | Attending: Family Medicine | Admitting: Family Medicine

## 2017-04-15 DIAGNOSIS — R51 Headache: Secondary | ICD-10-CM | POA: Diagnosis not present

## 2017-04-15 DIAGNOSIS — Z91018 Allergy to other foods: Secondary | ICD-10-CM | POA: Diagnosis not present

## 2017-04-15 DIAGNOSIS — N760 Acute vaginitis: Secondary | ICD-10-CM | POA: Insufficient documentation

## 2017-04-15 DIAGNOSIS — R42 Dizziness and giddiness: Secondary | ICD-10-CM | POA: Diagnosis present

## 2017-04-15 DIAGNOSIS — Z9851 Tubal ligation status: Secondary | ICD-10-CM | POA: Diagnosis not present

## 2017-04-15 DIAGNOSIS — K219 Gastro-esophageal reflux disease without esophagitis: Secondary | ICD-10-CM | POA: Diagnosis not present

## 2017-04-15 DIAGNOSIS — R1032 Left lower quadrant pain: Secondary | ICD-10-CM | POA: Diagnosis not present

## 2017-04-15 DIAGNOSIS — Z8249 Family history of ischemic heart disease and other diseases of the circulatory system: Secondary | ICD-10-CM | POA: Diagnosis not present

## 2017-04-15 DIAGNOSIS — N83209 Unspecified ovarian cyst, unspecified side: Secondary | ICD-10-CM | POA: Insufficient documentation

## 2017-04-15 DIAGNOSIS — Z79899 Other long term (current) drug therapy: Secondary | ICD-10-CM | POA: Insufficient documentation

## 2017-04-15 DIAGNOSIS — J45901 Unspecified asthma with (acute) exacerbation: Secondary | ICD-10-CM | POA: Diagnosis not present

## 2017-04-15 DIAGNOSIS — Z79891 Long term (current) use of opiate analgesic: Secondary | ICD-10-CM | POA: Diagnosis not present

## 2017-04-15 DIAGNOSIS — K828 Other specified diseases of gallbladder: Secondary | ICD-10-CM | POA: Insufficient documentation

## 2017-04-15 DIAGNOSIS — Z3202 Encounter for pregnancy test, result negative: Secondary | ICD-10-CM

## 2017-04-15 DIAGNOSIS — R110 Nausea: Secondary | ICD-10-CM

## 2017-04-15 DIAGNOSIS — Z9889 Other specified postprocedural states: Secondary | ICD-10-CM | POA: Diagnosis not present

## 2017-04-15 DIAGNOSIS — Z8349 Family history of other endocrine, nutritional and metabolic diseases: Secondary | ICD-10-CM | POA: Insufficient documentation

## 2017-04-15 DIAGNOSIS — Z91013 Allergy to seafood: Secondary | ICD-10-CM | POA: Diagnosis not present

## 2017-04-15 DIAGNOSIS — M545 Low back pain: Secondary | ICD-10-CM | POA: Diagnosis not present

## 2017-04-15 DIAGNOSIS — Z9049 Acquired absence of other specified parts of digestive tract: Secondary | ICD-10-CM | POA: Insufficient documentation

## 2017-04-15 DIAGNOSIS — Z9102 Food additives allergy status: Secondary | ICD-10-CM | POA: Diagnosis not present

## 2017-04-15 DIAGNOSIS — R350 Frequency of micturition: Secondary | ICD-10-CM | POA: Diagnosis not present

## 2017-04-15 DIAGNOSIS — K449 Diaphragmatic hernia without obstruction or gangrene: Secondary | ICD-10-CM | POA: Diagnosis not present

## 2017-04-15 DIAGNOSIS — Z91010 Allergy to peanuts: Secondary | ICD-10-CM | POA: Insufficient documentation

## 2017-04-15 DIAGNOSIS — B9689 Other specified bacterial agents as the cause of diseases classified elsewhere: Secondary | ICD-10-CM | POA: Diagnosis not present

## 2017-04-15 LAB — POCT URINALYSIS DIP (DEVICE)
Bilirubin Urine: NEGATIVE
Glucose, UA: NEGATIVE mg/dL
Hgb urine dipstick: NEGATIVE
Ketones, ur: NEGATIVE mg/dL
Leukocytes, UA: NEGATIVE
Nitrite: NEGATIVE
Protein, ur: NEGATIVE mg/dL
Specific Gravity, Urine: 1.015 (ref 1.005–1.030)
Urobilinogen, UA: 0.2 mg/dL (ref 0.0–1.0)
pH: 7 (ref 5.0–8.0)

## 2017-04-15 LAB — COMPREHENSIVE METABOLIC PANEL
ALT: 23 U/L (ref 14–54)
AST: 26 U/L (ref 15–41)
Albumin: 3.6 g/dL (ref 3.5–5.0)
Alkaline Phosphatase: 69 U/L (ref 38–126)
Anion gap: 5 (ref 5–15)
BUN: <5 mg/dL — ABNORMAL LOW (ref 6–20)
CO2: 28 mmol/L (ref 22–32)
Calcium: 8.6 mg/dL — ABNORMAL LOW (ref 8.9–10.3)
Chloride: 104 mmol/L (ref 101–111)
Creatinine, Ser: 0.95 mg/dL (ref 0.44–1.00)
GFR calc Af Amer: >60 mL/min (ref >60)
GFR calc non Af Amer: >60 mL/min (ref >60)
Glucose, Bld: 105 mg/dL — ABNORMAL HIGH (ref 65–99)
Potassium: 3.3 mmol/L — ABNORMAL LOW (ref 3.5–5.1)
Sodium: 137 mmol/L (ref 135–145)
Total Bilirubin: 0.4 mg/dL (ref 0.3–1.2)
Total Protein: 6.7 g/dL (ref 6.5–8.1)

## 2017-04-15 LAB — CBC WITH DIFFERENTIAL/PLATELET
Basophils Absolute: 0 10*3/uL (ref 0.0–0.1)
Basophils Relative: 0 %
Eosinophils Absolute: 0.2 10*3/uL (ref 0.0–0.7)
Eosinophils Relative: 3 %
HCT: 38.8 % (ref 36.0–46.0)
Hemoglobin: 12.6 g/dL (ref 12.0–15.0)
Lymphocytes Relative: 37 %
Lymphs Abs: 2.5 10*3/uL (ref 0.7–4.0)
MCH: 30.3 pg (ref 26.0–34.0)
MCHC: 32.5 g/dL (ref 30.0–36.0)
MCV: 93.3 fL (ref 78.0–100.0)
Monocytes Absolute: 0.5 10*3/uL (ref 0.1–1.0)
Monocytes Relative: 7 %
Neutro Abs: 3.7 10*3/uL (ref 1.7–7.7)
Neutrophils Relative %: 53 %
Platelets: 353 10*3/uL (ref 150–400)
RBC: 4.16 MIL/uL (ref 3.87–5.11)
RDW: 13.3 % (ref 11.5–15.5)
WBC: 6.8 10*3/uL (ref 4.0–10.5)

## 2017-04-15 LAB — POCT PREGNANCY, URINE: Preg Test, Ur: NEGATIVE

## 2017-04-15 NOTE — ED Triage Notes (Signed)
Pt here for dizziness, HA, nausea, back pain since this am. sts some vaginal spotting. sts she hasn't had a period since August. Denise dysuria, hematuria.

## 2017-04-15 NOTE — Discharge Instructions (Signed)
Keep hydrated, you urine should be clear to pale yellow in color. Tylenol for headache. We will contact you with lab results. Follow up with PCP for blood pressure recheck. If experiencing worsening of symptoms, nausea, vomiting, passing out, weakness, go to the emergency department for further evaluation.

## 2017-04-15 NOTE — ED Provider Notes (Signed)
[facility]    CSN: [id number] Arrival date & time: 04/15/17  1922     History   Chief Complaint Chief Complaint  Patient presents with  . Dizziness  . Nausea  . Back Pain    HPI Sandra Moody is a 46 y.o. female.   46 year old female with history of GERD, hiatal hernia, comes in for 1 day history of headache, nausea, back pain and dizziness. Patient states that she feels more of a faint feeling when standing up compared to room spinning. She has had urinary frequency, denies dysuria, hematuria. She states "I don't drink water". She is status post tubal ligation and D&C, LMP 03/07/2017. She states back pain is of the lower back, and pain is intermittent. Has not noticed what brings the pain on. Has been taking ibuprofen with some relief. She had cholecystectomy 1 month ago, incision sites healing well without drainage. Patient states that she continues on a bland diet, and has been having loose stools 3-4 times a day. Denies fever, chills, night sweats. Denies vomiting. Denies chest pain, shortness of breath, palpitations, leg swelling, weakness, syncope. Denies URI symptoms such as cough, congestion, ear pain, eye pain. Has had TSH checked within the past 6 months with normal results.       Past Medical History:  Diagnosis Date  . Acute renal disease   . Asthma   . Daily headache    (03/08/2017)  . GERD (gastroesophageal reflux disease)   . H pylori ulcer ~ 08/2016  . History of hiatal hernia   . Leaky heart valve ~ 09/2016  . Migraine    "maybe weekly" (03/08/2017)  . PONV (postoperative nausea and vomiting)     Patient Active Problem List   Diagnosis Date Noted  . Biliary dyskinesia 03/08/2017  . Asthma with acute exacerbation 10/04/2015  . Other and unspecified ovarian cyst 06/17/2012  . Pelvic pain in female 06/17/2012  . Bacterial vaginosis 06/17/2012    Past Surgical History:  Procedure Laterality Date  . CHOLECYSTECTOMY N/A 03/08/2017   Procedure:  LAPAROSCOPIC CHOLECYSTECTOMY;  Surgeon: Abigail Miyamoto, MD;  Location: [facility] OR;  Service: General;  Laterality: N/A;  . DILATION AND CURETTAGE OF UTERUS    . LAPAROSCOPIC CHOLECYSTECTOMY  03/08/2017  . TUBAL LIGATION      OB History    Gravida Para Term Preterm AB Living   0 2     SAB TAB Ectopic Multiple Live Births   1 1 0           Home Medications    Prior to Admission medications   Medication Sig Start Date End Date Taking? Authorizing Provider  albuterol (PROVENTIL HFA;VENTOLIN HFA) 108 (90 Base) MCG/ACT inhaler Inhale 1-2 puffs into the lungs every 6 (six) hours as needed for wheezing. 11/12/15   Garlon Hatchet, PA-C  albuterol (PROVENTIL) (2.5 MG/3ML) 0.083% nebulizer solution Inhale 2.5 mg into the lungs as needed. 05/17/16   [provider]  beclomethasone (QVAR) 80 MCG/ACT inhaler Inhale 1 puff into the lungs 2 (two) times daily.    [provider]  butalbital-acetaminophen-caffeine (FIORICET, ESGIC) [phone] MG tablet Take 1-2 tablets by mouth every 6 (six) hours as needed for headache. 03/16/17 03/16/18  McVey, Madelaine Bhat, PA-C  EPIPEN 2-PAK 0.3 MG/0.3ML SOAJ injection Inject 0.3 mg into the muscle as directed. 05/18/16   [provider]  HYDROcodone-acetaminophen (NORCO/VICODIN) 5-325 MG tablet Take 1-2 tablets by mouth every 4 (four) hours as needed.  03/09/17   Focht, Joyce Copa, PA  omeprazole (PRILOSEC) 20 MG capsule Take 1 capsule (20 mg total) by mouth daily. 09/02/16   Elvina Sidle, MD  VIORELE 0.15-0.02/0.01 MG (21/5) tablet Take 0.15 mg by mouth daily. 06/18/16   [provider]    Family History Family History  Problem Relation Age of Onset  . Thyroid disease Mother   . Heart disease Mother     Social History Social History  Substance Use Topics  . Smoking status: Never Smoker  . Smokeless tobacco: Never Used  . Alcohol use Yes     Comment: 03/08/2017 "might have a drink on a holiday"     Allergies     Ginger; Passion fruit flavor [flavoring agent]; Peanuts [peanut oil]; and Shellfish allergy   Review of Systems Review of Systems  Reason unable to perform ROS: See HPI as above.     Physical Exam Triage Vital Signs ED Triage Vitals  Enc Vitals Group     BP 04/15/17 1941 (!) 150/89     Pulse Rate 04/15/17 1941 70     Resp 04/15/17 1941 18     Temp 04/15/17 1941 98.4 F (36.9 C)     Temp src --      SpO2 04/15/17 1941 98 %     Weight --      Height --      Head Circumference --      Peak Flow --      Pain Score 04/15/17 1940 5     Pain Loc --      Pain Edu? --      Excl. in GC? --    Orthostatic VS for the past 24 hrs:  BP- Lying Pulse- Lying BP- Sitting Pulse- Sitting BP- Standing at 0 minutes Pulse- Standing at 0 minutes  04/15/17 2009 (!) 157/91 72 (!) 158/97 71 (!) 163/110 81    Updated Vital Signs BP (!) 150/89   Pulse 70   Temp 98.4 F (36.9 C)   Resp 18   LMP 03/07/2017   SpO2 98%   Physical Exam  Constitutional: She is oriented to person, place, and time. She appears well-developed and well-nourished. No distress.  HENT:  Head: Normocephalic and atraumatic.  Right Ear: Tympanic membrane, external ear and ear canal normal. Tympanic membrane is not erythematous and not bulging.  Left Ear: Tympanic membrane, external ear and ear canal normal. Tympanic membrane is not erythematous and not bulging.  Nose: Nose normal. Right sinus exhibits no maxillary sinus tenderness and no frontal sinus tenderness. Left sinus exhibits no maxillary sinus tenderness and no frontal sinus tenderness.  Mouth/Throat: Uvula is midline, oropharynx is clear and moist and mucous membranes are normal.  Eyes: Pupils are equal, round, and reactive to light. Conjunctivae are normal.  Neck: Normal range of motion. Neck supple.  Cardiovascular: Normal rate, regular rhythm and normal heart sounds.  Exam reveals no gallop and no friction rub.   No murmur heard. Pulmonary/Chest: Effort  normal and breath sounds normal. She has no decreased breath sounds. She has no wheezes. She has no rhonchi. She has no rales.  Abdominal: Soft. Bowel sounds are normal. She exhibits no distension and no mass. There is tenderness (LLQ). There is no rebound and no guarding.  Well healed incision sites. No surrounding erythema, increased warmth, drainage.   Musculoskeletal:  No tenderness on palpation of spinous processes and bilateral back. Full ROM of back, which patient states feels tightness where pain usually is.  Lymphadenopathy:    She has no cervical adenopathy.  Neurological: She is alert and oriented to person, place, and time. No cranial nerve deficit. She exhibits normal muscle tone. Coordination normal.  Skin: Skin is warm and dry.  Psychiatric: She has a normal mood and affect. Her behavior is normal. Judgment normal.     UC Treatments / Results  Labs (all labs ordered are listed, but only abnormal results are displayed) Labs Reviewed  COMPREHENSIVE METABOLIC PANEL - Abnormal; Notable for the following:       Result Value   Potassium 3.3 (*)    Glucose, Bld 105 (*)    BUN <5 (*)    Calcium 8.6 (*)    All other components within normal limits  CBC WITH DIFFERENTIAL/PLATELET  POCT URINALYSIS DIP (DEVICE)  POCT PREGNANCY, URINE    EKG  EKG Interpretation None       Radiology No results found.  Procedures Procedures (including critical care time)  Medications Ordered in UC Medications - No data to display   Initial Impression / Assessment and Plan / UC Course  I have reviewed the triage vital signs and the nursing notes.  Pertinent labs & imaging results that were available during my care of the patient were reviewed by me and considered in my medical decision making (see chart for details).    Discussed with patient no alarming signs on exam. Patient examined by attending, Dr Milus Glazier as well. Given recent surgery, will obtained CBC/CMP. As per Dr  Milus Glazier, will hold on treatment until blood work results. Patient will be contacted with the results. Patient to push fluids. Return precautions given.   Final Clinical Impressions(s) / UC Diagnoses   Final diagnoses:  LLQ pain  Dizziness    New Prescriptions Discharge Medication List as of 04/15/2017  8:21 PM        Belinda Fisher, PA-C 04/15/17 2152    Linward Headland V, PA-C 04/15/17 2155

## 2017-05-04 ENCOUNTER — Other Ambulatory Visit: Payer: Self-pay | Admitting: Nurse Practitioner

## 2017-05-04 DIAGNOSIS — R1084 Generalized abdominal pain: Secondary | ICD-10-CM

## 2017-05-11 ENCOUNTER — Ambulatory Visit
Admission: RE | Admit: 2017-05-11 | Discharge: 2017-05-11 | Disposition: A | Discharge status: Home / Self Care | Payer: Managed Care, Other (non HMO) | Source: Ambulatory Visit | Attending: Nurse Practitioner | Admitting: Nurse Practitioner

## 2017-05-11 DIAGNOSIS — R1084 Generalized abdominal pain: Secondary | ICD-10-CM

## 2017-05-11 MED ORDER — IOPAMIDOL (ISOVUE-300) INJECTION 61%
100.0000 mL | Freq: Once | INTRAVENOUS | Status: AC | PRN
  Administered 2017-05-11: 100 mL via INTRAVENOUS

## 2017-05-28 ENCOUNTER — Emergency Department (HOSPITAL_COMMUNITY): Payer: Managed Care, Other (non HMO)

## 2017-05-28 ENCOUNTER — Emergency Department (HOSPITAL_COMMUNITY)
Admission: EM | Admit: 2017-05-28 | Discharge: 2017-05-28 | Disposition: A | Discharge status: Home / Self Care | Payer: Managed Care, Other (non HMO) | Attending: Emergency Medicine | Admitting: Emergency Medicine

## 2017-05-28 ENCOUNTER — Encounter (HOSPITAL_COMMUNITY): Payer: Self-pay | Admitting: Emergency Medicine

## 2017-05-28 DIAGNOSIS — J069 Acute upper respiratory infection, unspecified: Secondary | ICD-10-CM | POA: Insufficient documentation

## 2017-05-28 DIAGNOSIS — J45909 Unspecified asthma, uncomplicated: Secondary | ICD-10-CM | POA: Diagnosis not present

## 2017-05-28 DIAGNOSIS — Z91010 Allergy to peanuts: Secondary | ICD-10-CM | POA: Insufficient documentation

## 2017-05-28 DIAGNOSIS — Z79899 Other long term (current) drug therapy: Secondary | ICD-10-CM | POA: Insufficient documentation

## 2017-05-28 DIAGNOSIS — B9789 Other viral agents as the cause of diseases classified elsewhere: Secondary | ICD-10-CM

## 2017-05-28 DIAGNOSIS — R05 Cough: Secondary | ICD-10-CM | POA: Diagnosis present

## 2017-05-28 MED ORDER — HYDROCODONE-HOMATROPINE 5-1.5 MG/5ML PO SYRP: 5.0000 mL | ORAL_SOLUTION | Freq: Four times a day (QID) | ORAL | 0 refills | Status: DC | PRN

## 2017-05-28 NOTE — ED Provider Notes (Signed)
[facility] Provider Note   CSN: [id number] Arrival date & time: 05/28/17  0345     History   Chief Complaint Chief Complaint  Patient presents with  . URI    HPI Sandra Moody is a 46 y.o. female w PMHx asthma, presenting to the ED with persistent cough and body aches since Thursday. Associated symptoms include runny nose and sneezing.  States cough is intermittently productive of yellow sputum.  She states she was seen by her primary care on Thursday, who prescribed her Tessalon for cough and allergy medication for other symptoms.  Flu Swab was done and negative.  Denies difficulty breathing or swallowing, sore throat, ear pain, fever, or other symptoms.  She is not needing to use her albuterol inhaler anymore than usual for her asthma.  The history is provided by the patient.    Past Medical History:  Diagnosis Date  . Acute renal disease   . Asthma   . Daily headache    (03/08/2017)  . GERD (gastroesophageal reflux disease)   . H pylori ulcer ~ 08/2016  . History of hiatal hernia   . Leaky heart valve ~ 09/2016  . Migraine    "maybe weekly" (03/08/2017)  . PONV (postoperative nausea and vomiting)     Patient Active Problem List   Diagnosis Date Noted  . Biliary dyskinesia 03/08/2017  . Asthma with acute exacerbation 10/04/2015  . Other and unspecified ovarian cyst 06/17/2012  . Pelvic pain in female 06/17/2012  . Bacterial vaginosis 06/17/2012    Past Surgical History:  Procedure Laterality Date  . CHOLECYSTECTOMY N/A 03/08/2017   Procedure: LAPAROSCOPIC CHOLECYSTECTOMY;  Surgeon: Abigail MiyamotoBlackman, Douglas, MD;  [facility];  Service: General;  Laterality: N/A;  . DILATION AND CURETTAGE OF UTERUS    . LAPAROSCOPIC CHOLECYSTECTOMY  03/08/2017  . TUBAL LIGATION      OB History    Gravida Para Term Preterm AB Living   5 3 3  0 2     SAB TAB Ectopic Multiple Live Births   1 1 0           Home Medications    Prior to  Admission medications   Medication Sig Start Date End Date Taking? Authorizing Provider  albuterol (PROVENTIL HFA;VENTOLIN HFA) 108 (90 Base) MCG/ACT inhaler Inhale 1-2 puffs into the lungs every 6 (six) hours as needed for wheezing. 11/12/15   Garlon HatchetSanders, Lisa M, PA-C  albuterol (PROVENTIL) (2.5 MG/3ML) 0.083% nebulizer solution Inhale 2.5 mg into the lungs as needed. 05/17/16   [provider]  beclomethasone (QVAR) 80 MCG/ACT inhaler Inhale 1 puff into the lungs 2 (two) times daily.    [provider]  butalbital-acetaminophen-caffeine (FIORICET, ESGIC) 830 742 306550-325-40 MG tablet Take 1-2 tablets by mouth every 6 (six) hours as needed for headache. 03/16/17 03/16/18  McVey, Madelaine BhatElizabeth Whitney, PA-C  EPIPEN 2-PAK 0.3 MG/0.3ML SOAJ injection Inject 0.3 mg into the muscle as directed. 05/18/16   [provider]  HYDROcodone-acetaminophen (NORCO/VICODIN) 5-325 MG tablet Take 1-2 tablets by mouth every 4 (four) hours as needed. 03/09/17   Focht, Joyce CopaJessica L, PA  HYDROcodone-homatropine (HYCODAN) 5-1.5 MG/5ML syrup Take 5 mLs by mouth every 6 (six) hours as needed for cough. 05/28/17   Norma Ignasiak, [place] N, PA-C  omeprazole (PRILOSEC) 20 MG capsule Take 1 capsule (20 mg total) by mouth daily. 09/02/16   Elvina SidleLauenstein, Kurt, MD  VIORELE 0.15-0.02/0.01 MG (21/5) tablet Take 0.15 mg by mouth daily. 06/18/16   [provider]  Family History Family History  Problem Relation Age of Onset  . Thyroid disease Mother   . Heart disease Mother     Social History Social History  Substance Use Topics  . Smoking status: Never Smoker  . Smokeless tobacco: Never Used  . Alcohol use Yes     Comment: 03/08/2017 "might have a drink on a holiday"     Allergies   Ginger; Passion fruit flavor [flavoring agent]; Peanuts [peanut oil]; and Shellfish allergy   Review of Systems Review of Systems  Constitutional: Positive for chills. Negative for fever.  HENT: Positive for rhinorrhea and sneezing.  Negative for congestion, ear pain, sore throat, trouble swallowing and voice change.   Respiratory: Positive for cough. Negative for shortness of breath.   Musculoskeletal: Positive for myalgias.  All other systems reviewed and are negative.    Physical Exam Updated Vital Signs BP 123/72 (BP Location: Right Arm)   Pulse 83   Temp 97.9 F (36.6 C) (Oral)   Resp 18   Ht 5\' 4"  (1.626 m)   Wt 66.7 kg (147 lb)   SpO2 99%   BMI 25.23 kg/m   Physical Exam  Constitutional: She appears well-developed and well-nourished. No distress.  Tolerating secretions.  HENT:  Head: Normocephalic and atraumatic.  Right Ear: Hearing, tympanic membrane, external ear and ear canal normal.  Left Ear: Hearing, tympanic membrane, external ear and ear canal normal.  Nose: Nose normal.  Mouth/Throat: Uvula is midline. No trismus in the jaw. No uvula swelling. Posterior oropharyngeal erythema (mild) present. No posterior oropharyngeal edema. No tonsillar exudate.  Eyes: Conjunctivae are normal.  Neck: Normal range of motion. Neck supple. No tracheal deviation present.  Cardiovascular: Normal rate, regular rhythm, normal heart sounds and intact distal pulses.   Pulmonary/Chest: Effort normal and breath sounds normal. No stridor. No respiratory distress. She has no wheezes. She has no rales.  Lymphadenopathy:    She has no cervical adenopathy.  Psychiatric: She has a normal mood and affect. Her behavior is normal.  Nursing note and vitals reviewed.    ED Treatments / Results  Labs (all labs ordered are listed, but only abnormal results are displayed) Labs Reviewed - No data to display  EKG  EKG Interpretation None       Radiology Dg Chest 2 View  Result Date: 05/28/2017 CLINICAL DATA:  Cough and chills. EXAM: CHEST  2 VIEW COMPARISON:  Radiographs and CT 11/11/2015 FINDINGS: The cardiomediastinal contours are normal. The lungs are clear. Pulmonary vasculature is normal. No consolidation,  pleural effusion, or pneumothorax. No acute osseous abnormalities are seen. IMPRESSION: No acute pulmonary process. Electronically Signed   By: Rubye Oaks M.D.   On: 05/28/2017 05:02    Procedures Procedures (including critical care time)  Medications Ordered in ED Medications - No data to display   Initial Impression / Assessment and Plan / ED Course  I have reviewed the triage vital signs and the nursing notes.  Pertinent labs & imaging results that were available during my care of the patient were reviewed by me and considered in my medical decision making (see chart for details).     Patients symptoms are consistent with URI, likely viral etiology. Pt CXR negative for acute infiltrate.  Discussed that antibiotics are not indicated for viral infections. Pt will be discharged with symptomatic treatment.  Verbalizes understanding and is agreeable with plan. Pt is hemodynamically stable & in NAD prior to dc.  Discussed results, findings, treatment and follow up. Patient  advised of return precautions. Patient verbalized understanding and agreed with plan.  Final Clinical Impressions(s) / ED Diagnoses   Final diagnoses:  Viral URI with cough    New Prescriptions New Prescriptions   HYDROCODONE-HOMATROPINE (HYCODAN) 5-1.5 MG/5ML SYRUP    Take 5 mLs by mouth every 6 (six) hours as needed for cough.     Missie Gehrig, [place] N, PA-C 05/28/17 [id number]    Devoria Albe, MD 05/28/17 [id number]

## 2017-05-28 NOTE — Discharge Instructions (Signed)
Please read instructions below. You can take tylenol and alternate with advil as needed for sore throat or body aches. Drink plenty of water. You can take the cough medication every 6 hours as needed. Use the coupon, as your insurance may not cover this medication. Continue using the allergy medication for runny nose and sneezing. Try a saline nasal spray as well. Follow up with your primary care provider as needed if symptoms persist. Return to the ER for difficulty swallowing liquids, difficulty breathing, or new or worsening symptoms.

## 2017-05-28 NOTE — ED Triage Notes (Signed)
Pt having persistent cold symptoms like cough, aches, chills for the past few days, seen by PCP, negative for flue test but not getting better.

## 2017-06-28 ENCOUNTER — Ambulatory Visit (INDEPENDENT_AMBULATORY_CARE_PROVIDER_SITE_OTHER): Payer: Managed Care, Other (non HMO) | Admitting: Allergy & Immunology

## 2017-06-28 ENCOUNTER — Encounter: Payer: Self-pay | Admitting: Allergy & Immunology

## 2017-06-28 VITALS — BP 124/82 | HR 76 | Ht 64.0 in | Wt 147.2 lb

## 2017-06-28 DIAGNOSIS — J3089 Other allergic rhinitis: Secondary | ICD-10-CM | POA: Diagnosis not present

## 2017-06-28 DIAGNOSIS — J4531 Mild persistent asthma with (acute) exacerbation: Secondary | ICD-10-CM | POA: Insufficient documentation

## 2017-06-28 DIAGNOSIS — J302 Other seasonal allergic rhinitis: Secondary | ICD-10-CM | POA: Insufficient documentation

## 2017-06-28 DIAGNOSIS — T781XXD Other adverse food reactions, not elsewhere classified, subsequent encounter: Secondary | ICD-10-CM | POA: Diagnosis not present

## 2017-06-28 DIAGNOSIS — K219 Gastro-esophageal reflux disease without esophagitis: Secondary | ICD-10-CM | POA: Diagnosis not present

## 2017-06-28 DIAGNOSIS — T781XXA Other adverse food reactions, not elsewhere classified, initial encounter: Secondary | ICD-10-CM | POA: Insufficient documentation

## 2017-06-28 MED ORDER — AMOXICILLIN-POT CLAVULANATE 875-125 MG PO TABS: 1.0000 | ORAL_TABLET | Freq: Two times a day (BID) | ORAL | 0 refills | Status: DC

## 2017-06-28 MED ORDER — FLUCONAZOLE 150 MG PO TABS: ORAL_TABLET | ORAL | 0 refills | Status: DC

## 2017-06-28 NOTE — Patient Instructions (Addendum)
1. Moderate persistent asthma without complication - Lung testing was normal today. - Continue Symbicort 80/4.5 which contains in inhaled steroid and long-acting form of albuterol.  - Daily controller medication(s): Symbicort 80/4.5 two puffs twice daily with spacer - Rescue medications: ProAir 4 puffs every 4-6 hours as needed - Asthma control goals:  * Full participation in all desired activities (may need albuterol before activity) * Albuterol use two time or less a week on average (not counting use with activity) * Cough interfering with sleep two time or less a month * Oral steroids no more than once a year * No hospitalizations  2. Perennial allergic rhinitis - Continue with the nasal steroid two sprays per nostril. - Continue with cetirizine as needed.  3. Adverse food reactions - It seems that you know of all of the foods that cause your reactions. - We can work this up further if you are interested.   4. Acute sinusitis - Augmentin 875 mg for 10 days - Diflucan 150. Take 1 tab in 7 days and take 1 tab in 14 days  5. Gastrointestinal Reflux Disease - Continue Dexilant as prescribed by your GI doctor  6. Follow up in 2 months.   Please inform us of any Emergency Department visits, hospitalizations, or changes in symptoms. Call us before going to the ED for breathing or allergy symptoms since we might be able to fit you in for a sick visit. Feel free to contact us anytime with any questions, problems, or concerns.  It was a pleasure to see you and your family again today! Best wishes in the [place]!   Websites that have reliable patient information: 1. American Academy of Asthma, Allergy, and Immunology: www.aaaai.org 2. Food Allergy Research and Education (FARE): foodallergy.org 3. Mothers of Asthmatics: http://www.asthmacommunitynetwork.org 4. American College of Allergy, Asthma, and Immunology: www.acaai.org

## 2017-06-28 NOTE — Progress Notes (Signed)
FOLLOW UP  Date of Service/Encounter:  06/29/17   Assessment:   Mild persistent asthma with acute exacerbation - complicated by non-compliance and lack of follow up   Perennial allergic rhinitis  Adverse food reaction (ginger, passionfruit, peanuts, shellfish) - needs updated testing at some point  Gastroesophageal reflux disease   Asthma Reportables:  Severity: moderate persistent  Risk: high Control: very poorly controlled    Plan/Recommendations:   1. Moderate persistent asthma without complication - Lung testing was normal today. - Continue Symbicort 80/4.5 which contains in inhaled steroid and long-acting form of albuterol.  - Daily controller medication(s): Symbicort 80/4.5 two puffs twice daily with spacer - Rescue medications: ProAir 4 puffs every 4-6 hours as needed - Asthma control goals:  * Full participation in all desired activities (may need albuterol before activity) * Albuterol use two time or less a week on average (not counting use with activity) * Cough interfering with sleep two time or less a month * Oral steroids no more than once a year * No hospitalizations - The importance of follow up was stressed.   2. Perennial allergic rhinitis - Continue with the nasal steroid two sprays per nostril. - Continue with cetirizine as needed.  3. Adverse food reactions - It seems that you know of all of the foods that cause your reactions. - We can work this up further if you are interested.   4. Acute sinusitis - Augmentin 875 mg for 10 days - Diflucan 150. Take 1 tab in 7 days and take 1 tab in 14 days  5. Gastrointestinal Reflux Disease - Continue Dexilant as prescribed by your GI doctor  6. Follow up in 2 months.   Subjective:   Sandra Moody is a 46 y.o. female presenting today for follow up of  Chief Complaint  Patient presents with  . Follow-up    Pt presents for request of refill on asthma medication.  . Frequent Infections    Pt  states she has been coughing up mucus with slight discoloration and has sinus pressure and pain on the left side of her face.    Sandra Moody has a history of the following: Patient Active Problem List   Diagnosis Date Noted  . Perennial allergic rhinitis 06/28/2017  . Mild persistent asthma with acute exacerbation 06/28/2017  . Adverse food reaction 06/28/2017  . Gastroesophageal reflux disease 06/28/2017  . Biliary dyskinesia 03/08/2017  . Asthma with acute exacerbation 10/04/2015  . Other and unspecified ovarian cyst 06/17/2012  . Pelvic pain in female 06/17/2012  . Bacterial vaginosis 06/17/2012    History obtained from: chart review and patient interview.  Sandra Moody's Primary Care Provider is Dorothyann PengSanders, Robyn, MD.     [name] is a 46 y.o. female presenting for a sick visit.  She was last seen in this office on 08/24/2016 by Dr. Dellis AnesGallagher for evaluation of asthma, allergic rhinitis, and hypertension.  She was started on Symbicort 80/4.5 in place of Qvar, continued on nasal steroid as needed, cetirizine as needed, and lisinopril 10 mg once a day.  Since the last visit, she reports she had a cholecystectomy on 03/08/2017.  Prior to that visit she had discontinued all of her medications, including her Symbicort inhaler and Singulair, due to abdominal pain and discomfort.  She is reporting today that she feels generally tired and has sinus pain over the left side of her face and in her teeth on the left side.  When she sneezes green thick  mucus drains from her nose.  She is not currently using any nasal steroids or nasal saline washes. About 2 weeks ago she received azithromycin and a prednisone taper for sinus pressure and thick green drainage with no resolution of symptoms.  She also reports in the last week that she has been gasping for air and has a tightness in her chest.  She denies shortness of breath and wheezing.  She is not currently using an asthma controller inhaler and reports  using ProAir 3 times a week she is not currently taking Singulair.   She reports her blood pressure fluctuates between high and low.  She denies dizziness, chest pain, and heart palpitations.  She has no longer taking lisinopril 10 mg daily as she feels this is not necessary.  She reports several food allergies that lead to cough and throat swelling including tree nuts, kiwi, and shellfish.  She also avoids ginger and mango due to lip tingling.  She reports she currently has an EpiPen for use with acute allergic symptoms.  Otherwise, there have been no changes to her past medical history, surgical history, family history, or social history.  Review of Systems: a 14-point review of systems is pertinent for what is mentioned in HPI.  Otherwise, all other systems were negative. Constitutional: negative other than that listed in the HPI Eyes: negative other than that listed in the HPI Ears, nose, mouth, throat, and face: negative other than that listed in the HPI Respiratory: negative other than that listed in the HPI Cardiovascular: negative other than that listed in the HPI Gastrointestinal: negative other than that listed in the HPI Genitourinary: negative other than that listed in the HPI Integument: negative other than that listed in the HPI Hematologic: negative other than that listed in the HPI Musculoskeletal: negative other than that listed in the HPI Neurological: negative other than that listed in the HPI Allergy/Immunologic: negative other than that listed in the HPI    Objective:   Blood pressure 124/82, pulse 76, height 5\' 4"  (1.626 m), weight 147 lb 3.2 oz (66.8 kg), SpO2 96 %. Body mass index is 25.27 kg/m.   Physical Exam:  General: Alert, interactive, in no acute distress. Eyes: No conjunctival injection present on the right and No conjunctival injection present on the left. PERRL bilaterally. EOMI without pain. No photophobia.  Ears: Right TM pearly gray with normal  light reflex and Left TM pearly gray with normal light reflex.  Nose/Throat: External nose within normal limits and septum midline. Turbinates moderately edematous with clear discharge. Posterior oropharynx mildly erythematous without cobblestoning in the posterior oropharynx. Tonsils unremarklable without exudates.  Tongue without thrush and Geographic tongue present. Adenopathy: no enlarged lymph nodes appreciated in the occipital, axillary, epitrochlear, inguinal, or popliteal regions. Lungs: Clear to auscultation without wheezing, rhonchi or rales. No increased work of breathing. CV: Normal S1/S2. No murmurs. Capillary refill <2 seconds.  Skin: Warm and dry, without lesions or rashes. Neuro:   Grossly intact. No focal deficits appreciated. Responsive to questions.  Diagnostic studies:   Spirometry: results normal (FEV1: 2.14/86%, FVC: 2.50/81%, FEV1/FVC: .86/106%).    Spirometry consistent with normal pattern. Albuterol/Atrovent nebulizer treatment given in clinic with no improvement.    I performed a history and physical examination of the patient and discussed her management with the Nurse Practitioner. I reviewed the Nurse Practitioner's note and agree with the documented findings and plan of care. The note in its entirety was edited by myself, including the physical exam, assessment,  and plan.   Malachi Bonds, MD FAAAAI [facility]

## 2017-06-29 ENCOUNTER — Encounter: Payer: Self-pay | Admitting: Allergy & Immunology

## 2017-08-15 ENCOUNTER — Ambulatory Visit (HOSPITAL_COMMUNITY)
Admission: EM | Admit: 2017-08-15 | Discharge: 2017-08-15 | Disposition: A | Discharge status: Home / Self Care | Payer: Managed Care, Other (non HMO) | Attending: Family Medicine | Admitting: Family Medicine

## 2017-08-15 ENCOUNTER — Other Ambulatory Visit: Payer: Self-pay

## 2017-08-15 ENCOUNTER — Encounter (HOSPITAL_COMMUNITY): Payer: Self-pay | Admitting: Emergency Medicine

## 2017-08-15 DIAGNOSIS — R51 Headache: Secondary | ICD-10-CM

## 2017-08-15 DIAGNOSIS — R519 Headache, unspecified: Secondary | ICD-10-CM

## 2017-08-15 MED ORDER — ONDANSETRON 4 MG PO TBDP
ORAL_TABLET | ORAL | Status: AC
  Filled 2017-08-15: qty 1

## 2017-08-15 MED ORDER — ONDANSETRON 4 MG PO TBDP
4.0000 mg | ORAL_TABLET | Freq: Once | ORAL | Status: AC
  Administered 2017-08-15: 4 mg via ORAL

## 2017-08-15 MED ORDER — KETOROLAC TROMETHAMINE 60 MG/2ML IM SOLN
60.0000 mg | Freq: Once | INTRAMUSCULAR | Status: AC
  Administered 2017-08-15: 60 mg via INTRAMUSCULAR

## 2017-08-15 MED ORDER — ONDANSETRON 4 MG PO TBDP: 4.0000 mg | ORAL_TABLET | Freq: Three times a day (TID) | ORAL | 0 refills | Status: DC | PRN

## 2017-08-15 MED ORDER — KETOROLAC TROMETHAMINE 60 MG/2ML IM SOLN
INTRAMUSCULAR | Status: AC
  Filled 2017-08-15: qty 2

## 2017-08-15 NOTE — Discharge Instructions (Signed)
Please seek prompt medical care if: You have: A very bad (severe) headache that is not helped by medicine. Trouble walking or weakness in your arms and legs. Changes in your seeing (vision). Jerky movements that you cannot control (seizure). You throw up (vomit). Your symptoms get worse. You lose balance. Your speech is slurred. You pass out. You are sleepier and have trouble staying awake. The black centers of your eyes (pupils) change in size.  These symptoms may be an emergency. Do not wait to see if the symptoms will go away. Get medical help right away. Call your local emergency services. Do not drive yourself to the hospital.

## 2017-08-15 NOTE — ED Triage Notes (Addendum)
Patient has a headache and blood pressure is 155/99.  Patient says her pcp told her to check blood pressure.  Headache started earlier today.    Patient started on doxycycline for lyme's-started medicine last week

## 2017-08-20 NOTE — ED Provider Notes (Signed)
[facility]   [id number] 08/15/17 Arrival Time: 1905  ASSESSMENT & PLAN:  1. Nonintractable headache, unspecified chronicity pattern, unspecified headache type     Meds ordered this encounter  Medications  . ketorolac (TORADOL) injection 60 mg  . ondansetron (ZOFRAN-ODT) disintegrating tablet 4 mg  . ondansetron (ZOFRAN-ODT) 4 MG disintegrating tablet    Sig: Take 1 tablet (4 mg total) by mouth every 8 (eight) hours as needed for nausea or vomiting.    Dispense:  15 tablet    Refill:  0   Will f/u with PCP or here if not seeing improvement by tomorrow morning; ED if acute worsening. BP slightly elevated; monitor.  Reviewed expectations re: course of current medical issues. Questions answered. Outlined signs and symptoms indicating need for more acute intervention. Patient verbalized understanding. After Visit Summary given.   SUBJECTIVE:  Sandra Moody is a 47 y.o. female who presents with complaint of a frontal dull headache; fairly persistent. Gradual onset this morning. H/O similar headaches; a few a month. Typically respond to ibuprofen but this hasn't helped much. Checked BP (155/99) and called PCP who instructed her to come here for evaluation. Taking home medications as directed. No recent illnesses. No visual or hearing changes. No associated photophobia. Some nausea without emesis. Overall decrease PO fluid intake. Overall mildly fatigue. Ambulatory without difficulty. No trigger for HA known. No specific aggravating or alleviating factors reported. No extremity sensation changes or weakness.  ROS: As per HPI.   OBJECTIVE:  Vitals:   08/15/17 1948  BP: (!) 159/94  Pulse: 80  Resp: 20  Temp: 98.6 F (37 C)  TempSrc: Oral  SpO2: 100%    General appearance: alert; no distress Eyes: PERRLA; EOMI; conjunctiva normal HENT: normocephalic; atraumatic Neck: supple Lungs: clear to auscultation bilaterally Heart: regular rate and rhythm Abdomen: soft,  non-tender; bowel sounds normal; no masses or organomegaly; no guarding or rebound tenderness Extremities: no cyanosis or edema; symmetrical with no gross deformities Skin: warm and dry Neuro: CN 2-12 grossly intact; normal extremity strength and sensation; normal gait Psychological: alert and cooperative; normal mood and affect   Allergies  Allergen Reactions  . Ginger Itching and Swelling  . Passion Fruit Flavor [Flavoring Agent] Itching and Swelling  . Peanuts [Peanut Oil] Anaphylaxis    PT ALLERGIC TO ALL TREE NUTS  . Shellfish Allergy Anaphylaxis    Past Medical History:  Diagnosis Date  . Acute renal disease   . Asthma   . Daily headache    (03/08/2017)  . GERD (gastroesophageal reflux disease)   . H pylori ulcer ~ 08/2016  . History of hiatal hernia   . Leaky heart valve ~ 09/2016  . Migraine    "maybe weekly" (03/08/2017)  . PONV (postoperative nausea and vomiting)    Social History   Socioeconomic History  . Marital status: Divorced    Spouse name: Not on file  . Number of children: Not on file  . Years of education: Not on file  . Highest education level: Not on file  Social Needs  . Financial resource strain: Not on file  . Food insecurity - worry: Not on file  . Food insecurity - inability: Not on file  . Transportation needs - medical: Not on file  . Transportation needs - non-medical: Not on file  Occupational History  . Not on file  Tobacco Use  . Smoking status: Never Smoker  . Smokeless tobacco: Never Used  Substance and Sexual Activity  . Alcohol  use: Yes    Comment: 03/08/2017 "might have a drink on a holiday"  . Drug use: No  . Sexual activity: Yes    Partners: Male    Birth control/protection: None, Pill  Other Topics Concern  . Not on file  Social History Narrative   ** Merged History Encounter **       Family History  Problem Relation Age of Onset  . Thyroid disease Mother    Past Surgical History:  Procedure Laterality Date  .  CHOLECYSTECTOMY N/A 03/08/2017   Procedure: LAPAROSCOPIC CHOLECYSTECTOMY;  Surgeon: Abigail Miyamoto, MD;  Location: [facility] OR;  Service: General;  Laterality: N/A;  . DILATION AND CURETTAGE OF UTERUS    . LAPAROSCOPIC CHOLECYSTECTOMY  03/08/2017  . TUBAL LIGATION       Mardella Layman, MD 08/20/17 [phone]

## 2017-08-22 ENCOUNTER — Emergency Department (HOSPITAL_COMMUNITY)
Admission: EM | Admit: 2017-08-22 | Discharge: 2017-08-22 | Disposition: A | Discharge status: Home / Self Care | Payer: Managed Care, Other (non HMO) | Attending: Emergency Medicine | Admitting: Emergency Medicine

## 2017-08-22 ENCOUNTER — Emergency Department (HOSPITAL_COMMUNITY): Payer: Managed Care, Other (non HMO)

## 2017-08-22 ENCOUNTER — Encounter (HOSPITAL_COMMUNITY): Payer: Self-pay | Admitting: Emergency Medicine

## 2017-08-22 ENCOUNTER — Other Ambulatory Visit: Payer: Self-pay

## 2017-08-22 DIAGNOSIS — Z79899 Other long term (current) drug therapy: Secondary | ICD-10-CM | POA: Insufficient documentation

## 2017-08-22 DIAGNOSIS — E876 Hypokalemia: Secondary | ICD-10-CM | POA: Diagnosis not present

## 2017-08-22 DIAGNOSIS — R51 Headache: Secondary | ICD-10-CM | POA: Diagnosis not present

## 2017-08-22 DIAGNOSIS — Z9889 Other specified postprocedural states: Secondary | ICD-10-CM | POA: Insufficient documentation

## 2017-08-22 DIAGNOSIS — Z91010 Allergy to peanuts: Secondary | ICD-10-CM | POA: Diagnosis not present

## 2017-08-22 DIAGNOSIS — R0789 Other chest pain: Secondary | ICD-10-CM | POA: Insufficient documentation

## 2017-08-22 DIAGNOSIS — R079 Chest pain, unspecified: Secondary | ICD-10-CM | POA: Diagnosis present

## 2017-08-22 DIAGNOSIS — K08409 Partial loss of teeth, unspecified cause, unspecified class: Secondary | ICD-10-CM

## 2017-08-22 DIAGNOSIS — R519 Headache, unspecified: Secondary | ICD-10-CM

## 2017-08-22 DIAGNOSIS — J4530 Mild persistent asthma, uncomplicated: Secondary | ICD-10-CM | POA: Diagnosis not present

## 2017-08-22 LAB — BASIC METABOLIC PANEL
Anion gap: 13 (ref 5–15)
BUN: 8 mg/dL (ref 6–20)
CO2: 21 mmol/L — ABNORMAL LOW (ref 22–32)
Calcium: 8.9 mg/dL (ref 8.9–10.3)
Chloride: 105 mmol/L (ref 101–111)
Creatinine, Ser: 1.02 mg/dL — ABNORMAL HIGH (ref 0.44–1.00)
GFR calc Af Amer: >60 mL/min (ref >60)
GFR calc non Af Amer: >60 mL/min (ref >60)
Glucose, Bld: 106 mg/dL — ABNORMAL HIGH (ref 65–99)
Potassium: 3.4 mmol/L — ABNORMAL LOW (ref 3.5–5.1)
Sodium: 139 mmol/L (ref 135–145)

## 2017-08-22 LAB — CBC
HCT: 41 % (ref 36.0–46.0)
Hemoglobin: 13.5 g/dL (ref 12.0–15.0)
MCH: 31 pg (ref 26.0–34.0)
MCHC: 32.9 g/dL (ref 30.0–36.0)
MCV: 94.3 fL (ref 78.0–100.0)
Platelets: 359 10*3/uL (ref 150–400)
RBC: 4.35 MIL/uL (ref 3.87–5.11)
RDW: 13 % (ref 11.5–15.5)
WBC: 8 10*3/uL (ref 4.0–10.5)

## 2017-08-22 LAB — I-STAT TROPONIN, ED: Troponin i, poc: 0 ng/mL (ref 0.00–0.08)

## 2017-08-22 LAB — I-STAT BETA HCG BLOOD, ED (MC, WL, AP ONLY): I-stat hCG, quantitative: <5 m[IU]/mL (ref <5)

## 2017-08-22 MED ORDER — MORPHINE SULFATE (PF) 4 MG/ML IV SOLN
4.0000 mg | Freq: Once | INTRAVENOUS | Status: AC
  Administered 2017-08-22: 4 mg via INTRAVENOUS
  Filled 2017-08-22: qty 1

## 2017-08-22 MED ORDER — OXYCODONE-ACETAMINOPHEN 5-325 MG PO TABS: 1.0000 | ORAL_TABLET | Freq: Four times a day (QID) | ORAL | 0 refills | Status: DC | PRN

## 2017-08-22 MED ORDER — SODIUM CHLORIDE 0.9 % IV BOLUS (SEPSIS)
1000.0000 mL | Freq: Once | INTRAVENOUS | Status: AC
  Administered 2017-08-22: 1000 mL via INTRAVENOUS

## 2017-08-22 MED ORDER — DIPHENHYDRAMINE HCL 50 MG/ML IJ SOLN
25.0000 mg | Freq: Once | INTRAMUSCULAR | Status: AC
  Administered 2017-08-22: 25 mg via INTRAVENOUS
  Filled 2017-08-22: qty 1

## 2017-08-22 MED ORDER — POTASSIUM CHLORIDE CRYS ER 20 MEQ PO TBCR
40.0000 meq | EXTENDED_RELEASE_TABLET | Freq: Once | ORAL | Status: AC
  Administered 2017-08-22: 40 meq via ORAL
  Filled 2017-08-22: qty 2

## 2017-08-22 MED ORDER — METOCLOPRAMIDE HCL 5 MG/ML IJ SOLN
10.0000 mg | Freq: Once | INTRAMUSCULAR | Status: AC
  Administered 2017-08-22: 10 mg via INTRAVENOUS
  Filled 2017-08-22: qty 2

## 2017-08-22 NOTE — ED Triage Notes (Signed)
Pt to ER for evaluation of central chest pain onset last night after "sitting up in bed" last night. Pt states had a tooth pulled yesterday with "complications." states recent headaches. Pt in NAD at this time.

## 2017-08-22 NOTE — ED Provider Notes (Signed)
[facility] EMERGENCY DEPARTMENT Provider Note   CSN: [id number] Arrival date & time: 08/22/17  4098     History   Chief Complaint Chief Complaint  Patient presents with  . Chest Pain    HPI Sandra Moody is a 47 y.o. female history of reflux, chronic headaches, here presenting with jaw pain, headaches, chest pain.  Patient states that she had a right lower molar pulled yesterday.  Initially she went to the dentist and was sent to oral surgeon, who attempted to do a root canal, but then she was sent back and had the tooth extracted.  She states that she was prescribed Tylenol No. 3 and has Vicodin at home but it is not controlling the pain.  She then developed left-sided chest pain with movement.  She also has worsening headaches.  Note, patient has frequent headaches and was seen in urgent care several days ago and was given some Zofran and felt better.  She had no recent neural imaging and does not see a neurologist.  Patient denies any recent travel or leg swelling or calf pain.  Patient was recently diagnosed with Lyme disease on labs but had no recent tick exposure.  She was prescribed doxycycline 2 weeks ago and is still taking it.  The history is provided by the patient.    Past Medical History:  Diagnosis Date  . Acute renal disease   . Asthma   . Daily headache    (03/08/2017)  . GERD (gastroesophageal reflux disease)   . H pylori ulcer ~ 08/2016  . History of hiatal hernia   . Leaky heart valve ~ 09/2016  . Migraine    "maybe weekly" (03/08/2017)  . PONV (postoperative nausea and vomiting)     Patient Active Problem List   Diagnosis Date Noted  . Perennial allergic rhinitis 06/28/2017  . Mild persistent asthma with acute exacerbation 06/28/2017  . Adverse food reaction 06/28/2017  . Gastroesophageal reflux disease 06/28/2017  . Biliary dyskinesia 03/08/2017  . Asthma with acute exacerbation 10/04/2015  . Other and unspecified ovarian cyst 06/17/2012   . Pelvic pain in female 06/17/2012  . Bacterial vaginosis 06/17/2012    Past Surgical History:  Procedure Laterality Date  . CHOLECYSTECTOMY N/A 03/08/2017   Procedure: LAPAROSCOPIC CHOLECYSTECTOMY;  Surgeon: Abigail Miyamoto, MD;  Location: [facility] OR;  Service: General;  Laterality: N/A;  . DILATION AND CURETTAGE OF UTERUS    . LAPAROSCOPIC CHOLECYSTECTOMY  03/08/2017  . TUBAL LIGATION      OB History    Gravida Para Term Preterm AB Living   5 3 3  0 2     SAB TAB Ectopic Multiple Live Births   1 1 0           Home Medications    Prior to Admission medications   Medication Sig Start Date End Date Taking? Authorizing Provider  acetaminophen-codeine (TYLENOL #3) 300-30 MG tablet Take 1 tablet by mouth every 6 (six) hours as needed for moderate pain.   Yes [provider]  albuterol (PROVENTIL HFA;VENTOLIN HFA) 108 (90 Base) MCG/ACT inhaler Inhale 1-2 puffs into the lungs every 6 (six) hours as needed for wheezing. 11/12/15  Yes Garlon Hatchet, PA-C  amLODipine (NORVASC) 2.5 MG tablet Take 2.5 mg by mouth daily. 08/16/17  Yes [provider]  budesonide-formoterol (SYMBICORT) 80-4.5 MCG/ACT inhaler Inhale 2 puffs into the lungs 2 (two) times daily.   Yes [provider]  desogestrel-ethinyl estradiol (KARIVA) 0.15-0.02/0.01 MG (21/5) tablet  Take 1 tablet by mouth daily. 01/15/17  Yes [provider]  doxycycline (VIBRAMYCIN) 100 MG capsule Take 100 mg by mouth 2 (two) times daily.   Yes [provider]  ondansetron (ZOFRAN-ODT) 4 MG disintegrating tablet Take 1 tablet (4 mg total) by mouth every 8 (eight) hours as needed for nausea or vomiting. 08/15/17  Yes Hagler, Arlys John, MD  albuterol (PROVENTIL) (2.5 MG/3ML) 0.083% nebulizer solution Inhale 2.5 mg into the lungs as needed. 05/17/16   [provider]  amoxicillin-clavulanate (AUGMENTIN) 875-125 MG tablet Take 1 tablet by mouth 2 (two) times daily. Patient not taking: Reported on  08/22/2017 06/28/17   Hetty Blend, FNP  butalbital-acetaminophen-caffeine (FIORICET, ESGIC) 6012576772 MG tablet Take 1-2 tablets by mouth every 6 (six) hours as needed for headache. Patient not taking: Reported on 06/28/2017 03/16/17 03/16/18  McVey, Madelaine Bhat, PA-C  EPIPEN 2-PAK 0.3 MG/0.3ML SOAJ injection Inject 0.3 mg into the muscle as directed. 05/18/16   [provider]  fluconazole (DIFLUCAN) 150 MG tablet Take 1 tablet 7 days after starting the Augmentin. Take the second tablet 14 days after starting the Augmentin Patient not taking: Reported on 08/22/2017 06/28/17   Hetty Blend, FNP  HYDROcodone-acetaminophen (NORCO/VICODIN) 5-325 MG tablet Take 1-2 tablets by mouth every 4 (four) hours as needed. Patient not taking: Reported on 06/28/2017 03/09/17   Jerre Simon, PA  HYDROcodone-homatropine (HYCODAN) 5-1.5 MG/5ML syrup Take 5 mLs by mouth every 6 (six) hours as needed for cough. Patient not taking: Reported on 06/28/2017 05/28/17   Robinson, [place] N, PA-C  omeprazole (PRILOSEC) 20 MG capsule Take 1 capsule (20 mg total) by mouth daily. Patient not taking: Reported on 06/28/2017 09/02/16   Elvina Sidle, MD    Family History Family History  Problem Relation Age of Onset  . Thyroid disease Mother     Social History Social History   Tobacco Use  . Smoking status: Never Smoker  . Smokeless tobacco: Never Used  Substance Use Topics  . Alcohol use: Yes    Comment: 03/08/2017 "might have a drink on a holiday"  . Drug use: No     Allergies   Ginger; Passion fruit flavor [flavoring agent]; Peanuts [peanut oil]; and Shellfish allergy   Review of Systems Review of Systems  HENT: Positive for dental problem.   Cardiovascular: Positive for chest pain.  Neurological: Positive for headaches.  All other systems reviewed and are negative.    Physical Exam Updated Vital Signs BP (!) 141/87   Pulse 88   Temp 98.9 F (37.2 C) (Oral)   Resp 14   LMP  07/25/2017   SpO2 99%   Physical Exam  Constitutional: She is oriented to person, place, and time.  Uncomfortable   HENT:  Head: Normocephalic.  R lower molar extracted, no obvious periapical abscess. No obvious jaw tenderness or swelling or LAD. Floor of mouth soft and not firm   Eyes: Pupils are equal, round, and reactive to light.  Neck: Normal range of motion.  Cardiovascular: Regular rhythm and normal pulses.  Pulmonary/Chest: Effort normal and breath sounds normal.  No obvious reproducible tenderness. No crackles   Abdominal: Soft.  Musculoskeletal: Normal range of motion.       Right lower leg: Normal. She exhibits no tenderness and no edema.       Left lower leg: Normal. She exhibits no tenderness and no edema.  Neurological: She is alert and oriented to person, place, and time.  CN 2-12 intact, nl strength  bilaterally   Skin: Skin is warm. Capillary refill takes less than 2 seconds.  Psychiatric: She has a normal mood and affect.  Nursing note and vitals reviewed.    ED Treatments / Results  Labs (all labs ordered are listed, but only abnormal results are displayed) Labs Reviewed  BASIC METABOLIC PANEL - Abnormal; Notable for the following components:      Result Value   Potassium 3.4 (*)    CO2 21 (*)    Glucose, Bld 106 (*)    Creatinine, Ser 1.02 (*)    All other components within normal limits  CBC  I-STAT TROPONIN, ED  I-STAT BETA HCG BLOOD, ED ([facility], [facility], [facility] ONLY)  I-STAT BETA HCG BLOOD, ED ([facility], [facility], [facility] ONLY)    EKG  EKG Interpretation  Date/Time:  Wednesday August 22 2017 08:20:01 EST Ventricular Rate:  85 PR Interval:  146 QRS Duration: 74 QT Interval:  350 QTC Calculation: 416 R Axis:   74 Text Interpretation:  Normal sinus rhythm Nonspecific T wave abnormality Abnormal ECG nonspecific changes since previous  Confirmed by Richardean Canal [phone]) on 08/22/2017 8:48:55 AM       Radiology Dg Chest 2 View  Result Date: 08/22/2017 CLINICAL DATA:  Mid  chest pain which began after sitting up in bed last night. Patient had a dental extraction yesterday with complications. EXAM: CHEST  2 VIEW COMPARISON:  Chest x-ray of May 28, 2017 FINDINGS: The heart size and mediastinal contours are within normal limits. Both lungs are clear. There is stable gentle curvature of the cervicothoracic junction convex toward the left. IMPRESSION: No active cardiopulmonary disease. Electronically Signed   By: Annison Birchard  [place] M.D.   On: 08/22/2017 08:51   Ct Head Wo Contrast  Result Date: 08/22/2017 CLINICAL DATA:  Diffuse headache for several days. Sensitivity to light and sound. History of migraines. Recent dental extraction. EXAM: CT HEAD WITHOUT CONTRAST TECHNIQUE: Contiguous axial images were obtained from the base of the skull through the vertex without intravenous contrast. COMPARISON:  05/28/2004 FINDINGS: Brain: There is no evidence of acute infarct, intracranial hemorrhage, mass, midline shift, or extra-axial fluid collection. The ventricles and sulci are normal. Vascular: No hyperdense vessel. Skull: No fracture or focal osseous lesion. Sinuses/Orbits: Visualized paranasal sinuses and mastoid air cells are clear. Orbits are unremarkable. Other: None. IMPRESSION: Negative head CT. Electronically Signed   By: Sebastian Ache M.D.   On: 08/22/2017 09:59    Procedures Procedures (including critical care time)  Medications Ordered in ED Medications  morphine 4 MG/ML injection 4 mg (not administered)  sodium chloride 0.9 % bolus 1,000 mL (1,000 mLs Intravenous New Bag/Given 08/22/17 0910)  morphine 4 MG/ML injection 4 mg (4 mg Intravenous Given 08/22/17 0912)  metoCLOPramide (REGLAN) injection 10 mg (10 mg Intravenous Given 08/22/17 0911)  diphenhydrAMINE (BENADRYL) injection 25 mg (25 mg Intravenous Given 08/22/17 0911)     Initial Impression / Assessment and Plan / ED Course  I have reviewed the triage vital signs and the nursing notes.  Pertinent labs & imaging  results that were available during my care of the patient were reviewed by me and considered in my medical decision making (see chart for details).    Sandra Moody is a 47 y.o. female here with chest pain, headache, tooth ache. Had R lower molar extraction yesterday. Socket appears well with no drainage or evidence of ludwig. She is on doxycyline already. She has chronic headaches with nl neuro exam but hasn't seen neurologist.  Has chest pain likely referred pain from the jaw. Low suspicion for ACS or PE. Will get labs, trop x 1, CXR, CT head. Will give pain meds, migraine cocktail.   10:47 AM Labs showed K 3.4 (likely from dec intake after dental extraction). CXR and CT head unremarkable. Pain controlled. Will try oxycodone for pain. Will have her follow up with neurologist, her dentist   Final Clinical Impressions(s) / ED Diagnoses   Final diagnoses:  None    ED Discharge Orders    None       Charlynne PanderYao, Tripp Goins Hsienta, MD 08/22/17 1048

## 2017-08-22 NOTE — Discharge Instructions (Signed)
Take motrin for pain.   Take percocet for severe pain. Do not drive with it.   Finish your doxycycline as prescribed.   Your potassium is low from not eating much.   See neurologist for headaches,   See your dentist next week for follow up   See primary care doctor regarding your chest pain   Return to ER if you have worse headaches, jaw swelling or pain, trouble swallowing, worse chest pain, fever, vomiting.

## 2017-09-27 ENCOUNTER — Ambulatory Visit: Payer: Self-pay | Admitting: Physician Assistant

## 2017-11-07 ENCOUNTER — Encounter: Payer: Self-pay | Admitting: Physician Assistant

## 2018-01-09 ENCOUNTER — Other Ambulatory Visit: Payer: Self-pay

## 2018-01-09 ENCOUNTER — Telehealth: Payer: Self-pay

## 2018-01-09 ENCOUNTER — Encounter (HOSPITAL_COMMUNITY): Payer: Self-pay | Admitting: Emergency Medicine

## 2018-01-09 ENCOUNTER — Ambulatory Visit (HOSPITAL_COMMUNITY)
Admission: EM | Admit: 2018-01-09 | Discharge: 2018-01-09 | Disposition: A | Discharge status: Home / Self Care | Payer: Managed Care, Other (non HMO) | Attending: Family Medicine | Admitting: Family Medicine

## 2018-01-09 DIAGNOSIS — R0981 Nasal congestion: Secondary | ICD-10-CM

## 2018-01-09 DIAGNOSIS — J45901 Unspecified asthma with (acute) exacerbation: Secondary | ICD-10-CM

## 2018-01-09 MED ORDER — PREDNISONE 10 MG (21) PO TBPK: ORAL_TABLET | Freq: Every day | ORAL | 0 refills | Status: DC

## 2018-01-09 NOTE — Telephone Encounter (Signed)
Patient called stating that she cut grass yesterday and is now c/o burning in her nose, nasal congestion. She states that she is having to breath through her mouth. She did have some clear nasal drainage yesterday, but none now. She did have some nonproductive coughing and used her nebulizer. She says cough is somewhat better now, just not able to breath through her nose. I did go over the last AVS. She is using all medications as needed. I did not schedule an appointment due to schedule being full and not having severe issues at this time. Please advise and thank you.

## 2018-01-09 NOTE — ED Provider Notes (Signed)
[facility]   [id number] 01/09/18 Arrival Time: 1720  ASSESSMENT & PLAN:  1. Moderate asthma with exacerbation, unspecified whether persistent   2. Nasal congestion    Nebulizer treatment needed: No.  Meds ordered this encounter  Medications  . predniSONE (STERAPRED UNI-PAK 21 TAB) 10 MG (21) TBPK tablet    Sig: Take by mouth daily. Take as directed.    Dispense:  21 tablet    Refill:  0   Has inhaler. Asthma precautions given. OTC symptom care as needed. May f/u with PCP or here as needed.  Reviewed expectations re: course of current medical issues. Questions answered. Outlined signs and symptoms indicating need for more acute intervention. Patient verbalized understanding. After Visit Summary given.  SUBJECTIVE: History from: patient.  Sandra Moody is a 47 y.o. female who presents with complaint of intermittent wheezing. Triggers: working outside. Onset abrupt when present, approximately a few days ago. Describes wheezing as mild to moderate when present. Fever: no. Overall normal PO intake without n/v. Sick contacts: no. Typically her asthma is well controlled. Inhaler use: daily the past few days. OTC treatment: none.  Social History   Tobacco Use  Smoking Status Never Smoker  Smokeless Tobacco Never Used    ROS: As per HPI.   OBJECTIVE:  Vitals:   01/09/18 1741  BP: 126/74  Pulse: 97  Temp: 98.8 F (37.1 C)  TempSrc: Oral  SpO2: 100%     General appearance: alert; appears fatigued HEENT: nasal congestion; clear runny nose; throat irritation secondary to post-nasal drainage Neck: supple without LAD Lungs: unlabored respirations, mild bilateral wheezing; cough: mild; no significant respiratory distress Skin: warm and dry Psychological: alert and cooperative; normal mood and affect   Allergies  Allergen Reactions  . Ginger Itching and Swelling  . Passion Fruit Flavor [Flavoring Agent] Itching and Swelling  . Peanuts [Peanut Oil]  Anaphylaxis    PT ALLERGIC TO ALL TREE NUTS  . Shellfish Allergy Anaphylaxis    Past Medical History:  Diagnosis Date  . Acute renal disease   . Asthma   . Daily headache    (03/08/2017)  . GERD (gastroesophageal reflux disease)   . H pylori ulcer ~ 08/2016  . History of hiatal hernia   . Leaky heart valve ~ 09/2016  . Migraine    "maybe weekly" (03/08/2017)  . PONV (postoperative nausea and vomiting)    Family History  Problem Relation Age of Onset  . Thyroid disease Mother    Social History   Socioeconomic History  . Marital status: Divorced    Spouse name: Not on file  . Number of children: Not on file  . Years of education: Not on file  . Highest education level: Not on file  Occupational History  . Not on file  Social Needs  . Financial resource strain: Not on file  . Food insecurity:    Worry: Not on file    Inability: Not on file  . Transportation needs:    Medical: Not on file    Non-medical: Not on file  Tobacco Use  . Smoking status: Never Smoker  . Smokeless tobacco: Never Used  Substance and Sexual Activity  . Alcohol use: Yes    Comment: 03/08/2017 "might have a drink on a holiday"  . Drug use: No  . Sexual activity: Yes    Partners: Male    Birth control/protection: None, Pill  Lifestyle  . Physical activity:    Days per week: Not on file  Minutes per session: Not on file  . Stress: Not on file  Relationships  . Social connections:    Talks on phone: Not on file    Gets together: Not on file    Attends religious service: Not on file    Active member of club or organization: Not on file    Attends meetings of clubs or organizations: Not on file    Relationship status: Not on file  . Intimate partner violence:    Fear of current or ex partner: Not on file    Emotionally abused: Not on file    Physically abused: Not on file    Forced sexual activity: Not on file  Other Topics Concern  . Not on file  Social History Narrative   ** Merged  History Encounter Mardella Layman**                Tymar Polyak, MD 02/04/18 [phone]

## 2018-01-09 NOTE — Discharge Instructions (Signed)
You may try over the counter AFRIN 12 hour nasal spray very sparingly.

## 2018-01-09 NOTE — ED Triage Notes (Signed)
Pt states she mowed the yard yesterday and since then her asthma has been acting up.  She states her BP has been elevated and she has been shaky for the last few hours.

## 2018-01-14 NOTE — Telephone Encounter (Signed)
Noted and agree with plan. We can send in some prednisone if she is desperate. Sorry about the delay.   Malachi BondsJoel Arrick Dutton, MD [facility]

## 2018-01-14 NOTE — Telephone Encounter (Signed)
Called patient and received no answer.  

## 2018-01-14 NOTE — Telephone Encounter (Signed)
Appears that patient was seen by [facility] facility and given prednisone 10 mg tablets.

## 2018-01-15 NOTE — Telephone Encounter (Signed)
Excellent.  Thanks for reaching out.  Shaune Westfall, MD [facility]  

## 2018-01-18 ENCOUNTER — Telehealth: Payer: Self-pay | Admitting: Allergy & Immunology

## 2018-01-18 MED ORDER — ALBUTEROL SULFATE (2.5 MG/3ML) 0.083% IN NEBU: 2.5000 mg | INHALATION_SOLUTION | Freq: Four times a day (QID) | RESPIRATORY_TRACT | 1 refills | Status: DC | PRN

## 2018-01-18 MED ORDER — NEBULIZER/TUBING/MOUTHPIECE KIT: PACK | 5 refills | Status: DC

## 2018-01-18 NOTE — Telephone Encounter (Signed)
Pt called and needs to have neb  [facility] [address]. And need a rx for proventil neb sol. [phone]

## 2018-01-18 NOTE — Telephone Encounter (Signed)
Orders have been printed and signed. Patient is aware.

## 2018-01-23 ENCOUNTER — Encounter: Payer: Self-pay | Admitting: Allergy & Immunology

## 2018-01-23 ENCOUNTER — Ambulatory Visit (INDEPENDENT_AMBULATORY_CARE_PROVIDER_SITE_OTHER): Payer: Managed Care, Other (non HMO) | Admitting: Allergy & Immunology

## 2018-01-23 VITALS — BP 118/78 | HR 120 | Temp 98.8°F | Resp 20

## 2018-01-23 DIAGNOSIS — T781XXD Other adverse food reactions, not elsewhere classified, subsequent encounter: Secondary | ICD-10-CM | POA: Diagnosis not present

## 2018-01-23 DIAGNOSIS — J3089 Other allergic rhinitis: Secondary | ICD-10-CM | POA: Diagnosis not present

## 2018-01-23 DIAGNOSIS — J0100 Acute maxillary sinusitis, unspecified: Secondary | ICD-10-CM | POA: Diagnosis not present

## 2018-01-23 DIAGNOSIS — J302 Other seasonal allergic rhinitis: Secondary | ICD-10-CM

## 2018-01-23 DIAGNOSIS — J4540 Moderate persistent asthma, uncomplicated: Secondary | ICD-10-CM | POA: Diagnosis not present

## 2018-01-23 DIAGNOSIS — K219 Gastro-esophageal reflux disease without esophagitis: Secondary | ICD-10-CM

## 2018-01-23 MED ORDER — FLUCONAZOLE 150 MG PO TABS: 150.0000 mg | ORAL_TABLET | ORAL | 0 refills | Status: AC

## 2018-01-23 MED ORDER — BUDESONIDE-FORMOTEROL FUMARATE 160-4.5 MCG/ACT IN AERO: 2.0000 | INHALATION_SPRAY | Freq: Every day | RESPIRATORY_TRACT | 3 refills | Status: DC

## 2018-01-23 MED ORDER — AMOXICILLIN-POT CLAVULANATE 875-125 MG PO TABS: 1.0000 | ORAL_TABLET | Freq: Two times a day (BID) | ORAL | 0 refills | Status: AC

## 2018-01-23 MED ORDER — AZELASTINE HCL 0.1 % NA SOLN: 2.0000 | Freq: Two times a day (BID) | NASAL | 3 refills | Status: DC

## 2018-01-23 MED ORDER — METHYLPREDNISOLONE ACETATE 40 MG/ML IJ SUSP
40.0000 mg | Freq: Once | INTRAMUSCULAR | Status: AC
  Administered 2018-01-23: 40 mg via INTRAMUSCULAR

## 2018-01-23 NOTE — Progress Notes (Signed)
FOLLOW UP  Date of Service/Encounter:  01/23/18   Assessment:   Moderate persistent asthma without complication - with non-compliance  Adverse food reaction - multiple foods  Seasonal and perennial allergic rhinitis (grass, weeds, trees, indoor molds, outdoor molds, cat, dust mite)  Gastroesophageal reflux disease   Asthma Reportables:  Severity: moderate persistent  Risk: high due to non-compliance Control: not well controlled  Plan/Recommendations:   1. Moderate persistent asthma without complication - Lung testing was normal today. - We will change you to the higher dose Symbicort since you are only using it once daily.  - DepoMedrol 40mg  given in clinic today (this will last in your system for a few days and help while your Augmentin kicks in).  - Spacer use reviewed. - Daily controller medication(s): Symbicort 160/4.50mcg two puffs once daily - Prior to physical activity: ProAir 2 puffs 10-15 minutes before physical activity. - Rescue medications: ProAir 4 puffs every 4-6 hours as needed - Changes during respiratory infections or worsening symptoms: Increase Symbicort 160/4.5 to 2 puffs twice daily for TWO WEEKS. - Asthma control goals:  * Full participation in all desired activities (may need albuterol before activity) * Albuterol use two time or less a week on average (not counting use with activity) * Cough interfering with sleep two time or less a month * Oral steroids no more than once a year * No hospitalizations  2. Perennial and seasonal allergic rhinitis - Continue with the nasal steroid two sprays per nostril. - Add on Astelin 1-2 sprays per nostril twice daily as needed during your worst nasal symptoms.  - Continue with cetirizine as needed.  3. Adverse food reactions - It seems that you know of all of the foods that cause your reactions. - We can work this up further if you are interested.   4. Acute sinusitis - With your current symptoms and time  course, antibiotics are needed: Augmentin 875mg  twice daily for 14 days - Add on nasal saline spray (i.e., Simply Saline) or nasal saline lavage (i.e., NeilMed) as needed prior to medicated nasal sprays. - For thick post nasal drainage, add guaifenesin [phone] mg (Mucinex) twice daily as needed for mucous thinning with adequate hydration to help it work.   5. Gastrointestinal Reflux Disease - Continue Dexilant as prescribed by your GI doctor  6. Return in about 4 months (around 05/25/2018).    Subjective:   Sandra Moody is a 47 y.o. female presenting today for follow up of  Chief Complaint  Patient presents with  . Allergic Rhinitis     Sandra Moody has a history of the following: Patient Active Problem List   Diagnosis Date Noted  . Moderate persistent asthma without complication 01/23/2018  . Seasonal and perennial allergic rhinitis 06/28/2017  . Mild persistent asthma with acute exacerbation 06/28/2017  . Adverse food reaction 06/28/2017  . Gastroesophageal reflux disease 06/28/2017  . Biliary dyskinesia 03/08/2017  . Asthma with acute exacerbation 10/04/2015  . Other and unspecified ovarian cyst 06/17/2012  . Pelvic pain in female 06/17/2012  . Bacterial vaginosis 06/17/2012    History obtained from: chart review and patient.  Harlow Mares Primary Care Provider is Pa, [facility].     Sandra Moody is a 47 y.o. female presenting for a sick visit.  She was last seen in November 2018 by A at that time, her lung testing was normal.  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  80/4.5 mcg 2 puffs twice  daily.  She was continued on cetirizine as needed and her nasal steroid for her rhinitis.  She was also diagnosed with acute sinusitis and started on Augmentin twice daily for 10 days.  She has a history of GERD and was continued on Dexilant.  Since the last visit, she has not done well. She did go to Urgent Care last week due to sinusitis.  She reports that she was around fresh cut grass, which seems to trigger her symptoms. She works at [employer]. She tells me that she has marked nasal congestion. She has been breathing through her mouth as well. At Urgent Care, she was given a prednisone taper, which did not seem to help at all. She was also to use Afrin which never really seemed to work very well.   She has not been using fluticasone since Urgent Care told her to stop this medication for whatever reason. She was using it prior to this and did not feel that it was helping much at all. She has never been on Astelin.   Asthma has not been well controlled. She has been using breathing treatments fairly frequently. She has been Symbicort two puffs twice daily; she has a "little bit" in the samples that we gave her since she was not using it on a daily basis.  She is continuing to avoid all of her triggering foods. She does have a long standing history of migraines and is being treated with injections as well as imipramine which is providing some relief. Injections were started a couple of months ago. She is unsure of the name of the injections, but does not think that it was Botox. She was put back into the lisinopril since it was felt that the migraines were contributing to her elevated blood pressure.   Otherwise, there have been no changes to her past medical history, surgical history, family history, or social history.    Review of Systems: a 14-point review of systems is pertinent for what is mentioned in HPI.  Otherwise, all other systems were negative. Constitutional: negative other than that listed in the HPI Eyes: negative other than that listed in the HPI Ears, nose, mouth, throat, and face: negative other than that listed in the HPI Respiratory: negative other than that listed in the HPI Cardiovascular: negative other than that listed in the HPI Gastrointestinal: negative other than that listed in the  HPI Genitourinary: negative other than that listed in the HPI Integument: negative other than that listed in the HPI Hematologic: negative other than that listed in the HPI Musculoskeletal: negative other than that listed in the HPI Neurological: negative other than that listed in the HPI Allergy/Immunologic: negative other than that listed in the HPI    Objective:   Blood pressure 118/78, pulse (!) 120, temperature 98.8 F (37.1 C), temperature source Oral, resp. rate 20, last menstrual period 12/28/2017. There is no height or weight on file to calculate BMI.   Physical Exam:  General: Alert, interactive, in no acute distress. Pleasant. Clearly not feeling well, however.  Eyes: No conjunctival injection bilaterally, no discharge on the right, no discharge on the left and no Horner-Trantas dots present. PERRL bilaterally. EOMI without pain. No photophobia.  Ears: Right TM pearly gray with normal light reflex, Left TM pearly gray with normal light reflex, Right TM intact without perforation and Left TM intact without perforation.  Nose/Throat: External nose within normal limits, nasal crease present and septum midline. Turbinates edematous with scant discharge. Posterior oropharynx  erythematous with cobblestoning in the posterior oropharynx. Tonsils 2+ without exudates.  Tongue without thrush. Bilateral maxillary sinus tenderness.  Lungs: Clear to auscultation without wheezing, rhonchi or rales. No increased work of breathing. CV: Normal S1/S2. No murmurs. Capillary refill <2 seconds.  Skin: Warm and dry, without lesions or rashes. Neuro:   Grossly intact. No focal deficits appreciated. Responsive to questions.  Diagnostic studies:   Spirometry: results normal (FEV1: 2.62/107%, FVC: 3.07/102%, FEV1/FVC: 85%).    Spirometry consistent with normal pattern.   Allergy Studies: none       Malachi Bonds, MD  [facility]

## 2018-01-23 NOTE — Patient Instructions (Addendum)
1. Moderate persistent asthma without complication - Lung testing was normal today. - We will change you to the higher dose Symbicort since you are only using it once daily.  - DepoMedrol 40mg  given in clinic today (this will last in your system for a few days and help while your Augmentin kicks in).  - Spacer use reviewed. - Daily controller medication(s): Symbicort 160/4.25mcg two puffs once daily - Prior to physical activity: ProAir 2 puffs 10-15 minutes before physical activity. - Rescue medications: ProAir 4 puffs every 4-6 hours as needed - Changes during respiratory infections or worsening symptoms: Increase Symbicort 160/4.5 to 2 puffs twice daily for TWO WEEKS. - Asthma control goals:  * Full participation in all desired activities (may need albuterol before activity) * Albuterol use two time or less a week on average (not counting use with activity) * Cough interfering with sleep two time or less a month * Oral steroids no more than once a year * No hospitalizations  2. Perennial and seasonal allergic rhinitis - Continue with the nasal steroid two sprays per nostril. - Add on Astelin 1-2 sprays per nostril twice daily as needed during your worst nasal symptoms.  - Continue with cetirizine as needed.  3. Adverse food reactions - It seems that you know of all of the foods that cause your reactions. - We can work this up further if you are interested.   4. Acute sinusitis - With your current symptoms and time course, antibiotics are needed: Augmentin 875mg  twice daily for 14 days - Add on nasal saline spray (i.e., Simply Saline) or nasal saline lavage (i.e., NeilMed) as needed prior to medicated nasal sprays. - For thick post nasal drainage, add guaifenesin [phone] mg (Mucinex) twice daily as needed for mucous thinning with adequate hydration to help it work.  ] 5. Gastrointestinal Reflux Disease - Continue Dexilant as prescribed by your GI doctor  6. Return in about 4 months  (around 05/25/2018).   Please inform us of any Emergency Department visits, hospitalizations, or changes in symptoms. Call us before going to the ED for breathing or allergy symptoms since we might be able to fit you in for a sick visit. Feel free to contact us anytime with any questions, problems, or concerns.  It was a pleasure to see you again today!  Websites that have reliable patient information: 1. American Academy of Asthma, Allergy, and Immunology: www.aaaai.org 2. Food Allergy Research and Education (FARE): foodallergy.org 3. Mothers of Asthmatics: http://www.asthmacommunitynetwork.org 4. American College of Allergy, Asthma, and Immunology: MissingWeapons.cawww.acaai.org   Make sure you are registered to vote!

## 2018-02-23 ENCOUNTER — Encounter (HOSPITAL_COMMUNITY): Payer: Self-pay | Admitting: Emergency Medicine

## 2018-02-23 ENCOUNTER — Emergency Department (HOSPITAL_COMMUNITY): Payer: Managed Care, Other (non HMO)

## 2018-02-23 ENCOUNTER — Emergency Department (HOSPITAL_COMMUNITY)
Admission: EM | Admit: 2018-02-23 | Discharge: 2018-02-24 | Disposition: A | Discharge status: Home / Self Care | Payer: Managed Care, Other (non HMO) | Attending: Emergency Medicine | Admitting: Emergency Medicine

## 2018-02-23 DIAGNOSIS — E876 Hypokalemia: Secondary | ICD-10-CM | POA: Insufficient documentation

## 2018-02-23 DIAGNOSIS — R52 Pain, unspecified: Secondary | ICD-10-CM

## 2018-02-23 DIAGNOSIS — Z79899 Other long term (current) drug therapy: Secondary | ICD-10-CM | POA: Insufficient documentation

## 2018-02-23 DIAGNOSIS — J4540 Moderate persistent asthma, uncomplicated: Secondary | ICD-10-CM | POA: Diagnosis not present

## 2018-02-23 DIAGNOSIS — M79604 Pain in right leg: Secondary | ICD-10-CM | POA: Insufficient documentation

## 2018-02-23 NOTE — ED Triage Notes (Signed)
Pt was at rest tonight and began having shooting pains in right calf/leg. WOrse with ambulation. Feels warm to touch.

## 2018-02-24 ENCOUNTER — Ambulatory Visit (HOSPITAL_BASED_OUTPATIENT_CLINIC_OR_DEPARTMENT_OTHER)
Admission: RE | Admit: 2018-02-24 | Discharge: 2018-02-24 | Disposition: A | Discharge status: Home / Self Care | Payer: Managed Care, Other (non HMO) | Source: Ambulatory Visit | Attending: Emergency Medicine | Admitting: Emergency Medicine

## 2018-02-24 ENCOUNTER — Other Ambulatory Visit: Payer: Self-pay

## 2018-02-24 DIAGNOSIS — M79604 Pain in right leg: Secondary | ICD-10-CM | POA: Insufficient documentation

## 2018-02-24 DIAGNOSIS — M79609 Pain in unspecified limb: Secondary | ICD-10-CM | POA: Diagnosis not present

## 2018-02-24 LAB — I-STAT CHEM 8, ED
BUN: 4 mg/dL — ABNORMAL LOW (ref 6–20)
Calcium, Ion: 1.16 mmol/L (ref 1.15–1.40)
Chloride: 101 mmol/L (ref 98–111)
Creatinine, Ser: 0.8 mg/dL (ref 0.44–1.00)
Glucose, Bld: 89 mg/dL (ref 70–99)
HCT: 35 % — ABNORMAL LOW (ref 36.0–46.0)
Hemoglobin: 11.9 g/dL — ABNORMAL LOW (ref 12.0–15.0)
Potassium: 3 mmol/L — ABNORMAL LOW (ref 3.5–5.1)
Sodium: 142 mmol/L (ref 135–145)
TCO2: 29 mmol/L (ref 22–32)

## 2018-02-24 MED ORDER — POTASSIUM CHLORIDE CRYS ER 20 MEQ PO TBCR: 20.0000 meq | EXTENDED_RELEASE_TABLET | Freq: Two times a day (BID) | ORAL | 0 refills | Status: DC

## 2018-02-24 MED ORDER — POTASSIUM CHLORIDE CRYS ER 20 MEQ PO TBCR
40.0000 meq | EXTENDED_RELEASE_TABLET | Freq: Once | ORAL | Status: AC
  Administered 2018-02-24: 40 meq via ORAL
  Filled 2018-02-24: qty 2

## 2018-02-24 NOTE — ED Notes (Signed)
Dr. Cardama at the bedside.  

## 2018-02-24 NOTE — ED Provider Notes (Signed)
[facility] EMERGENCY DEPARTMENT Provider Note  CSN: [id number] Arrival date & time: 02/23/18 2243  Chief Complaint(s) Leg Pain  HPI Sandra Moody is a 47 y.o. female with a past medical history listed below presents to the emergency department with several hours of right leg pain described as stabbing, intermittent in nature.  Endorsing mild discomfort at this time.  Pain was exacerbated with movement of the leg and palpation.  No alleviating factors.  Patient denied any trauma.  No swelling.  No fevers or chills.  Denies any prolonged immobilization.  States that she is on Depo.  Denies any prior history of DVT/PEs.   HPI  Past Medical History Past Medical History:  Diagnosis Date  . Acute renal disease   . Asthma   . Daily headache    (03/08/2017)  . GERD (gastroesophageal reflux disease)   . H pylori ulcer ~ 08/2016  . History of hiatal hernia   . Leaky heart valve ~ 09/2016  . Migraine    "maybe weekly" (03/08/2017)  . PONV (postoperative nausea and vomiting)    Patient Active Problem List   Diagnosis Date Noted  . Moderate persistent asthma without complication 14/48/1856  . Seasonal and perennial allergic rhinitis 06/28/2017  . Mild persistent asthma with acute exacerbation 06/28/2017  . Adverse food reaction 06/28/2017  . Gastroesophageal reflux disease 06/28/2017  . Biliary dyskinesia 03/08/2017  . Asthma with acute exacerbation 10/04/2015  . Other and unspecified ovarian cyst 06/17/2012  . Pelvic pain in female 06/17/2012  . Bacterial vaginosis 06/17/2012   Home Medication(s) Prior to Admission medications   Medication Sig Start Date End Date Taking? Authorizing Provider  albuterol (PROVENTIL HFA;VENTOLIN HFA) 108 (90 Base) MCG/ACT inhaler Inhale 1-2 puffs into the lungs every 6 (six) hours as needed for wheezing. 11/12/15  Yes Larene Pickett, PA-C  albuterol (PROVENTIL) (2.5 MG/3ML) 0.083% nebulizer solution Inhale 3 mLs (2.5 mg total) into the  lungs every 6 (six) hours as needed. 01/18/18  Yes Ambs, Kathrine Cords, FNP  amLODipine (NORVASC) 2.5 MG tablet Take 2.5 mg by mouth daily. 08/16/17  Yes [provider]  azelastine (ASTELIN) 0.1 % nasal spray Place 2 sprays into both nostrils 2 (two) times daily. 01/23/18  Yes Valentina Shaggy, MD  budesonide-formoterol [facility]) 160-4.5 MCG/ACT inhaler Inhale 2 puffs into the lungs daily. 01/23/18  Yes Valentina Shaggy, MD  EPIPEN 2-PAK 0.3 MG/0.3ML SOAJ injection Inject 0.3 mg into the muscle as needed (for allergic reaction).  05/18/16  Yes [provider]  imipramine (TOFRANIL) 25 MG tablet Take 75 mg by mouth at bedtime.   Yes [provider]  ondansetron (ZOFRAN-ODT) 4 MG disintegrating tablet Take 1 tablet (4 mg total) by mouth every 8 (eight) hours as needed for nausea or vomiting. Patient not taking: Reported on 02/24/2018 08/15/17   Vanessa Kick, MD  potassium chloride SA (K-DUR,KLOR-CON) 20 MEQ tablet Take 1 tablet (20 mEq total) by mouth 2 (two) times daily for 7 days. 02/24/18 03/03/18  Fatima Blank, MD  Respiratory Therapy Supplies (NEBULIZER/TUBING/MOUTHPIECE) KIT Use as directed with nebulizer and medication. 01/18/18   Dara Hoyer, FNP  Past Surgical History Past Surgical History:  Procedure Laterality Date  . CHOLECYSTECTOMY N/A 03/08/2017   Procedure: LAPAROSCOPIC CHOLECYSTECTOMY;  Surgeon: Coralie Keens, MD;  Location: [place];  Service: General;  Laterality: N/A;  . DILATION AND CURETTAGE OF UTERUS    . LAPAROSCOPIC CHOLECYSTECTOMY  03/08/2017  . TUBAL LIGATION     Family History Family History  Problem Relation Age of Onset  . Thyroid disease Mother     Social History Social History   Tobacco Use  . Smoking status: Never Smoker  . Smokeless tobacco: Never Used  Substance Use Topics  . Alcohol use:  Yes    Comment: 03/08/2017 "might have a drink on a holiday"  . Drug use: No   Allergies Ginger; Passion fruit flavor [flavoring agent]; Peanuts [peanut oil]; and Shellfish allergy  Review of Systems Review of Systems All other systems are reviewed and are negative for acute change except as noted in the HPI  Physical Exam Vital Signs  I have reviewed the triage vital signs BP (!) 133/98   Pulse 93   Temp 98.3 F (36.8 C) (Oral)   Resp 15   Ht _0  (1.626 m)   Wt 66.7 kg (147 lb)   SpO2 100%   BMI 25.23 kg/m   Physical Exam  Constitutional: She is oriented to person, place, and time. She appears well-developed and well-nourished. No distress.  HENT:  Head: Normocephalic and atraumatic.  Right Ear: External ear normal.  Left Ear: External ear normal.  Nose: Nose normal.  Eyes: Conjunctivae and EOM are normal. No scleral icterus.  Neck: Normal range of motion and phonation normal.  Cardiovascular: Normal rate and regular rhythm.  Pulses:      Posterior tibial pulses are 2+ on the right side, and 2+ on the left side.  Pulmonary/Chest: Effort normal. No stridor. No respiratory distress.  Abdominal: She exhibits no distension.  Musculoskeletal: Normal range of motion. She exhibits no edema.       Right upper leg: She exhibits tenderness. She exhibits no bony tenderness and no swelling.       Right lower leg: She exhibits tenderness. She exhibits no bony tenderness and no swelling.  Neurological: She is alert and oriented to person, place, and time.  Skin: She is not diaphoretic.  Psychiatric: She has a normal mood and affect. Her behavior is normal.  Vitals reviewed.   ED Results and Treatments Labs (all labs ordered are listed, but only abnormal results are displayed) Labs Reviewed  I-STAT CHEM 8, ED - Abnormal; Notable for the following components:      Result Value   Potassium 3.0 (*)    BUN 4 (*)    Hemoglobin 11.9 (*)    HCT 35.0 (*)    All other components  within normal limits  EKG  EKG Interpretation  Date/Time:    Ventricular Rate:    PR Interval:    QRS Duration:   QT Interval:    QTC Calculation:   R Axis:     Text Interpretation:        Radiology Dg Tibia/fibula Right  Result Date: 02/23/2018 CLINICAL DATA:  Pain.  No known injury. EXAM: RIGHT TIBIA AND FIBULA - 2 VIEW COMPARISON:  None. FINDINGS: There is no evidence of fracture or other focal bone lesions. Soft tissues are unremarkable. IMPRESSION: Negative. Electronically Signed   By: Rolm Baptise M.D.   On: 02/23/2018 23:57   Dg Femur, Min 2 Views Right  Result Date: 02/24/2018 CLINICAL DATA:  Pain.  No known injury. EXAM: RIGHT FEMUR 2 VIEWS COMPARISON:  None. FINDINGS: No acute bony abnormality. Specifically, no fracture, subluxation, or dislocation. Joint space maintained. IMPRESSION: Negative. Electronically Signed   By: Rolm Baptise M.D.   On: 02/23/2018 23:59   Pertinent labs & imaging results that were available during my care of the patient were reviewed by me and considered in my medical decision making (see chart for details).  Medications Ordered in ED Medications  potassium chloride SA (K-DUR,KLOR-CON) CR tablet 40 mEq (has no administration in time range)                                                                                                                                    Procedures Procedures  (including critical care time)  Medical Decision Making / ED Course I have reviewed the nursing notes for this encounter and the patient's prior records (if available in EHR or on provided paperwork).    Right leg pain.  No evidence of cellulitis.  No evidence of swelling or edema concerning for DVT however patient is on Depo..  She declines treatment at this time.  I feel the this is appropriate given the low probability.  We will have  patient return in the morning for ultrasound to rule out a DVT as ultrasound is not available at this time.  Upon review of records, noted that the patient had prior metabolic panel is notable for mild hypokalemia.  Repeat panel today showed worsening of her hypokalemia at 3.0.  This may be contributing to muscle cramps/spasms.  Repleted orally.  Will prescribe short course of potassium.  Recommend PCP follow-up.  The patient appears reasonably screened and/or stabilized for discharge and I doubt any other medical condition or other [facility] requiring further screening, evaluation, or treatment in the ED at this time prior to discharge.  The patient is safe for discharge with strict return precautions.   Final Clinical Impression(s) / ED Diagnoses Final diagnoses:  Right leg pain  Hypokalemia   Disposition: Discharge  Condition: Good  I have discussed the results, Dx and Tx plan with the patient who expressed understanding and agree(s) with the plan. Discharge instructions discussed at great length. The  patient was given strict return precautions who verbalized understanding of the instructions. No further questions at time of discharge.    ED Discharge Orders        Ordered    potassium chloride SA (K-DUR,KLOR-CON) 20 MEQ tablet  2 times daily     02/24/18 0353    LE VENOUS     02/24/18 0353       Follow Up: Pa, [place] [facility] [address] [place] [address] [phone]  Schedule an appointment as soon as possible for a visit  In 1 to 2 weeks to follow-up for low potassium      This chart was dictated using voice recognition software.  Despite best efforts to proofread,  errors can occur which can change the documentation meaning.   Fatima Blank, MD 02/24/18 [phone]

## 2018-02-24 NOTE — Progress Notes (Signed)
VASCULAR LAB PRELIMINARY  PRELIMINARY  PRELIMINARY  PRELIMINARY  Right lower extremity venous duplex completed.    Preliminary report:  There is no DVT or SVT noted in the right lower extremity.   Kate Larock, RVT 02/24/2018, 3:22 PM

## 2018-03-18 ENCOUNTER — Telehealth: Payer: Self-pay

## 2018-03-18 NOTE — Telephone Encounter (Signed)
Patient is requesting a letter for work stating she is Anaphylactic to some chemicals and foods for work.  She would also like it emailed to  [email or web address]

## 2018-03-18 NOTE — Telephone Encounter (Signed)
Dr. Gallagher will you please advise? 

## 2018-03-19 ENCOUNTER — Telehealth: Payer: Self-pay | Admitting: Allergy & Immunology

## 2018-03-19 NOTE — Telephone Encounter (Signed)
Patient called checking on a message she left yesterday for a letter stating she had asthma. I told her Dr. Dellis AnesGallagher was out yesterday and should be getting to that message today. She is also requesting a refill for her EpiPen. She stated hers has expired. [facility] on [address].

## 2018-03-19 NOTE — Telephone Encounter (Signed)
Duplicate. See telephone encounter 03-18-2018

## 2018-03-19 NOTE — Telephone Encounter (Signed)
I left a message for patient to call back to discuss.

## 2018-03-19 NOTE — Telephone Encounter (Signed)
I reviewed all of her notes and I have absolutely nothing in there about chemical sensitivities. There is a reference to food sensitivities, but there are still pending labs from October 2017 that have not been collected. I do have a positive skin test to salmon from October, but otherwise there is nothing regarding foods that has been positive.   With all of these holes in her history, it is impossible and fraudulent at this point for me to write a letter. If she would like to come in to discuss further, I would be happy to see her.  Thanks, Malachi BondsJoel Chassity Ludke, MD [facility]

## 2018-03-19 NOTE — Telephone Encounter (Signed)
Patient called checking on a message she left yesterday for a letter stating she had asthma. I told her Dr. Dellis AnesGallagher was out yesterday and should be getting to that message today. She is also requesting a refill for her EpiPen. She stated hers has expired. [facility] on [address].      Documentation

## 2018-03-20 NOTE — Telephone Encounter (Signed)
I called patient and left detailed message that letter is ready for pick up.

## 2018-03-20 NOTE — Telephone Encounter (Signed)
Letter written. I cannot put more about her food allergies because of her pending labs from 2017.   Malachi BondsJoel Stephone Gum, MD [facility]

## 2018-03-20 NOTE — Telephone Encounter (Signed)
I spoke with patient and she states that she needs a letter advising she has asthma and anaphylaxis. I informed her that we could not just say she had general anaphylaxis because that would be fraudulent. I informed her that we could send a message for Dr. Dellis AnesGallagher for a letter about the asthma and the food allergies.

## 2018-05-09 ENCOUNTER — Other Ambulatory Visit: Payer: Self-pay | Admitting: Allergy & Immunology

## 2018-05-12 ENCOUNTER — Ambulatory Visit (HOSPITAL_COMMUNITY)
Admission: EM | Admit: 2018-05-12 | Discharge: 2018-05-12 | Disposition: A | Discharge status: Home / Self Care | Payer: Managed Care, Other (non HMO) | Attending: Family Medicine | Admitting: Family Medicine

## 2018-05-12 ENCOUNTER — Encounter (HOSPITAL_COMMUNITY): Payer: Self-pay

## 2018-05-12 DIAGNOSIS — G43001 Migraine without aura, not intractable, with status migrainosus: Secondary | ICD-10-CM | POA: Diagnosis not present

## 2018-05-12 MED ORDER — KETOROLAC TROMETHAMINE 60 MG/2ML IM SOLN
INTRAMUSCULAR | Status: AC
Start: 2018-05-12 — End: ?
  Filled 2018-05-12: qty 2

## 2018-05-12 MED ORDER — KETOROLAC TROMETHAMINE 60 MG/2ML IM SOLN
60.0000 mg | Freq: Once | INTRAMUSCULAR | Status: AC
  Administered 2018-05-12: 60 mg via INTRAMUSCULAR

## 2018-05-12 MED ORDER — BUTALBITAL-APAP-CAFFEINE 50-325-40 MG PO TABS: 1.0000 | ORAL_TABLET | Freq: Four times a day (QID) | ORAL | 0 refills | Status: DC | PRN

## 2018-05-12 NOTE — ED Triage Notes (Signed)
Pt presents with ongoing migraine and congestion.

## 2018-05-12 NOTE — ED Provider Notes (Signed)
[place]    CSN: [id number] Arrival date & time: 05/12/18  1127     History   Chief Complaint Chief Complaint  Patient presents with  . Migraine  . Congestion    HPI Sandra Moody is a 47 y.o. female.   This is a 47 year old woman who works in a Design[employer], fashion/clothing and has a history of migraine headaches.  She is taking a dose of imipramine every day to prevent them.  Her last major headache was about 9 months ago.  She developed this headache about 3 days ago and has been constant ever since.  It involves her whole head but particularly around her eyes.  She said no nausea or vomiting, fever, stiff neck, or focal neurological changes.  She has never been tried on Imitrex.  Patient is having difficulty sleeping.  Her appetite was nonexistent yesterday.  She says that whenever she stops to rest, headache is worse, so she stays active.     Past Medical History:  Diagnosis Date  . Acute renal disease   . Asthma   . Daily headache    (03/08/2017)  . GERD (gastroesophageal reflux disease)   . H pylori ulcer ~ 08/2016  . History of hiatal hernia   . Leaky heart valve ~ 09/2016  . Migraine    "maybe weekly" (03/08/2017)  . PONV (postoperative nausea and vomiting)     Patient Active Problem List   Diagnosis Date Noted  . Moderate persistent asthma without complication 64/40/3474  . Seasonal and perennial allergic rhinitis 06/28/2017  . Mild persistent asthma with acute exacerbation 06/28/2017  . Adverse food reaction 06/28/2017  . Gastroesophageal reflux disease 06/28/2017  . Biliary dyskinesia 03/08/2017  . Asthma with acute exacerbation 10/04/2015  . Other and unspecified ovarian cyst 06/17/2012  . Pelvic pain in female 06/17/2012  . Bacterial vaginosis 06/17/2012    Past Surgical History:  Procedure Laterality Date  . CHOLECYSTECTOMY N/A 03/08/2017   Procedure: LAPAROSCOPIC CHOLECYSTECTOMY;  Surgeon: Coralie Keens, MD;  Location: [facility];  Service:  General;  Laterality: N/A;  . DILATION AND CURETTAGE OF UTERUS    . LAPAROSCOPIC CHOLECYSTECTOMY  03/08/2017  . TUBAL LIGATION      OB History    Gravida  5   Para  3   Term  3   Preterm  0   AB  2   Living        SAB  1   TAB  1   Ectopic  0   Multiple      Live Births               Home Medications    Prior to Admission medications   Medication Sig Start Date End Date Taking? Authorizing Provider  albuterol (PROVENTIL HFA;VENTOLIN HFA) 108 (90 Base) MCG/ACT inhaler Inhale 1-2 puffs into the lungs every 6 (six) hours as needed for wheezing. 11/12/15   Larene Pickett, PA-C  albuterol (PROVENTIL) (2.5 MG/3ML) 0.083% nebulizer solution Inhale 3 mLs (2.5 mg total) into the lungs every 6 (six) hours as needed. 01/18/18   Dara Hoyer, FNP  amLODipine (NORVASC) 2.5 MG tablet Take 2.5 mg by mouth daily. 08/16/17   [provider]  azelastine (ASTELIN) 0.1 % nasal spray PLACE 2 SPRAYS INTO BOTH NOSTRILS 2 (TWO) TIMES DAILY. 05/09/18   Valentina Shaggy, MD  butalbital-acetaminophen-caffeine (FIORICET, ESGIC) [phone] MG tablet Take 1-2 tablets by mouth every 6 (six) hours as needed for headache. 05/12/18  05/12/19  Robyn Haber, MD  EPIPEN 2-PAK 0.3 MG/0.3ML SOAJ injection Inject 0.3 mg into the muscle as needed (for allergic reaction).  05/18/16   [provider]  imipramine (TOFRANIL) 25 MG tablet Take 75 mg by mouth at bedtime.    [provider]  Respiratory Therapy Supplies (NEBULIZER/TUBING/MOUTHPIECE) KIT Use as directed with nebulizer and medication. 01/18/18   Dara Hoyer, FNP  SYMBICORT 160-4.5 MCG/ACT inhaler INHALE 2 PUFFS INTO THE LUNGS DAILY. 05/09/18   Valentina Shaggy, MD    Family History Family History  Problem Relation Age of Onset  . Thyroid disease Mother     Social History Social History   Tobacco Use  . Smoking status: Never Smoker  . Smokeless tobacco: Never Used  Substance Use Topics  . Alcohol  use: Yes    Comment: 03/08/2017 "might have a drink on a holiday"  . Drug use: No     Allergies   Ginger; Passion fruit flavor [flavoring agent]; Peanuts [peanut oil]; Shellfish allergy; and Tamiflu [oseltamivir phosphate]   Review of Systems Review of Systems   Physical Exam Triage Vital Signs ED Triage Vitals  Enc Vitals Group     BP 05/12/18 1150 131/81     Pulse Rate 05/12/18 1150 (!) 107     Resp 05/12/18 1150 20     Temp 05/12/18 1150 98 F (36.7 C)     Temp Source 05/12/18 1150 Oral     SpO2 05/12/18 1150 100 %     Weight --      Height --      Head Circumference --      Peak Flow --      Pain Score 05/12/18 1151 6     Pain Loc --      Pain Edu? --      Excl. in [place]? --    No data found.  Updated Vital Signs BP 131/81   Pulse (!) 107   Temp 98 F (36.7 C) (Oral)   Resp 20   SpO2 100%    Physical Exam  Constitutional: She is oriented to person, place, and time. She appears well-developed and well-nourished.  HENT:  Right Ear: External ear normal.  Left Ear: External ear normal.  Mouth/Throat: Oropharynx is clear and moist.  Normal TMs  Eyes: Conjunctivae are normal.  Neck: Normal range of motion. Neck supple.  Cardiovascular: Normal rate and regular rhythm.  Pulmonary/Chest: Effort normal and breath sounds normal.  Musculoskeletal: Normal range of motion.  Neurological: She is alert and oriented to person, place, and time.  Skin: Skin is warm and dry.  Nursing note and vitals reviewed.    UC Treatments / Results  Labs (all labs ordered are listed, but only abnormal results are displayed) Labs Reviewed - No data to display  EKG None  Radiology No results found.  Procedures Procedures (including critical care time)  Medications Ordered in UC Medications  ketorolac (TORADOL) injection 60 mg (60 mg Intramuscular Given 05/12/18 1208)    Initial Impression / Assessment and Plan / UC Course  I have reviewed the triage vital signs and the  nursing notes.  Pertinent labs & imaging results that were available during my care of the patient were reviewed by me and considered in my medical decision making (see chart for details).     Final Clinical Impressions(s) / UC Diagnoses   Final diagnoses:  Migraine without aura and with status migrainosus, not intractable   Discharge Instructions  None    ED Prescriptions    Medication Sig Dispense Auth. Provider   butalbital-acetaminophen-caffeine (FIORICET, ESGIC) 50-325-40 MG tablet Take 1-2 tablets by mouth every 6 (six) hours as needed for headache. 10 tablet Robyn Haber, MD     Controlled Substance Prescriptions [place] Controlled Substance Registry consulted? Not Applicable   Robyn Haber, MD 05/12/18 1210

## 2018-05-12 NOTE — Discharge Instructions (Addendum)
Hopefully the medicine we are providing today will relieve your headache.  If not, call your headache doctor first thing tomorrow and see if they can work in.

## 2018-05-18 ENCOUNTER — Other Ambulatory Visit: Payer: Self-pay

## 2018-05-18 ENCOUNTER — Emergency Department (HOSPITAL_COMMUNITY)
Admission: EM | Admit: 2018-05-18 | Discharge: 2018-05-19 | Disposition: A | Discharge status: Home / Self Care | Payer: Managed Care, Other (non HMO) | Attending: Emergency Medicine | Admitting: Emergency Medicine

## 2018-05-18 ENCOUNTER — Encounter (HOSPITAL_COMMUNITY): Payer: Self-pay | Admitting: Student

## 2018-05-18 DIAGNOSIS — Z91010 Allergy to peanuts: Secondary | ICD-10-CM | POA: Insufficient documentation

## 2018-05-18 DIAGNOSIS — Z79899 Other long term (current) drug therapy: Secondary | ICD-10-CM | POA: Insufficient documentation

## 2018-05-18 DIAGNOSIS — R51 Headache: Secondary | ICD-10-CM | POA: Insufficient documentation

## 2018-05-18 DIAGNOSIS — J45901 Unspecified asthma with (acute) exacerbation: Secondary | ICD-10-CM | POA: Insufficient documentation

## 2018-05-18 DIAGNOSIS — R519 Headache, unspecified: Secondary | ICD-10-CM

## 2018-05-18 MED ORDER — KETOROLAC TROMETHAMINE 15 MG/ML IJ SOLN
15.0000 mg | Freq: Once | INTRAMUSCULAR | Status: AC
  Administered 2018-05-19: 15 mg via INTRAVENOUS
  Filled 2018-05-18: qty 1

## 2018-05-18 MED ORDER — SODIUM CHLORIDE 0.9 % IV BOLUS
1000.0000 mL | Freq: Once | INTRAVENOUS | Status: AC
  Administered 2018-05-19: 1000 mL via INTRAVENOUS

## 2018-05-18 MED ORDER — SODIUM CHLORIDE 0.9 % IV SOLN: INTRAVENOUS | Status: DC

## 2018-05-18 MED ORDER — PROCHLORPERAZINE EDISYLATE 10 MG/2ML IJ SOLN
10.0000 mg | Freq: Once | INTRAMUSCULAR | Status: AC
  Administered 2018-05-19: 10 mg via INTRAVENOUS
  Filled 2018-05-18: qty 2

## 2018-05-18 MED ORDER — DEXAMETHASONE SODIUM PHOSPHATE 10 MG/ML IJ SOLN
10.0000 mg | Freq: Once | INTRAMUSCULAR | Status: AC
  Administered 2018-05-19: 10 mg via INTRAVENOUS
  Filled 2018-05-18: qty 1

## 2018-05-18 NOTE — ED Provider Notes (Signed)
[facility] EMERGENCY DEPARTMENT Provider Note   CSN: [id number] Arrival date & time: 05/18/18  2836     History   Chief Complaint Chief Complaint  Patient presents with  . Headache    HPI Sandra Moody is a 47 y.o. female with a hx of daily headaches, migraines, who is s/p tubal ligation presenting to the ED with complaitns of headache for the past 2 weeks.  Patient describes her headache as being diffuse, but mostly to the left side of her head.  She states pain started gradually, progressively worsened, and has been constant since onset.  She states that she has had some mild relief with taking of 800 mg ibuprofen as well as tramadol.  She states the pain is worse with loud noises. She has had some associated nausea without vomiting. She states that she was seen by family med given shot of toradol which resolved the headache, but it ultimately returned. She also was told it could be dental in origin as she is having left upper dental work done next week, however she denies dental pain to me. She states she has a hx of similar headaches. She has chronic issues with migraines, takes Imipramine daily for prevention and gets injections by headache clinic monthly, last was 2 weeks ago.  Denies fever, chills, change in vision, dizziness, numbness, tingling, or weakness.  She denies neck stiffness, difficulty swallowing, sore throat, drooling, or intraoral drainage.  HPI  Past Medical History:  Diagnosis Date  . Acute renal disease   . Asthma   . Daily headache    (03/08/2017)  . GERD (gastroesophageal reflux disease)   . H pylori ulcer ~ 08/2016  . History of hiatal hernia   . Leaky heart valve ~ 09/2016  . Migraine    "maybe weekly" (03/08/2017)  . PONV (postoperative nausea and vomiting)     Patient Active Problem List   Diagnosis Date Noted  . Moderate persistent asthma without complication 62/94/7654  . Seasonal and perennial allergic rhinitis 06/28/2017  . Mild  persistent asthma with acute exacerbation 06/28/2017  . Adverse food reaction 06/28/2017  . Gastroesophageal reflux disease 06/28/2017  . Biliary dyskinesia 03/08/2017  . Asthma with acute exacerbation 10/04/2015  . Other and unspecified ovarian cyst 06/17/2012  . Pelvic pain in female 06/17/2012  . Bacterial vaginosis 06/17/2012    Past Surgical History:  Procedure Laterality Date  . CHOLECYSTECTOMY N/A 03/08/2017   Procedure: LAPAROSCOPIC CHOLECYSTECTOMY;  Surgeon: Coralie Keens, MD;  Location: [facility];  Service: General;  Laterality: N/A;  . DILATION AND CURETTAGE OF UTERUS    . LAPAROSCOPIC CHOLECYSTECTOMY  03/08/2017  . TUBAL LIGATION       OB History    Gravida  5   Para  3   Term  3   Preterm  0   AB  2   Living        SAB  1   TAB  1   Ectopic  0   Multiple      Live Births               Home Medications    Prior to Admission medications   Medication Sig Start Date End Date Taking? Authorizing Provider  albuterol (PROVENTIL HFA;VENTOLIN HFA) 108 (90 Base) MCG/ACT inhaler Inhale 1-2 puffs into the lungs every 6 (six) hours as needed for wheezing. 11/12/15   Larene Pickett, PA-C  albuterol (PROVENTIL) (2.5 MG/3ML) 0.083% nebulizer solution Inhale 3 mLs (  2.5 mg total) into the lungs every 6 (six) hours as needed. 01/18/18   Dara Hoyer, FNP  amLODipine (NORVASC) 2.5 MG tablet Take 2.5 mg by mouth daily. 08/16/17   [provider]  azelastine (ASTELIN) 0.1 % nasal spray PLACE 2 SPRAYS INTO BOTH NOSTRILS 2 (TWO) TIMES DAILY. 05/09/18   Valentina Shaggy, MD  butalbital-acetaminophen-caffeine (FIORICET, ESGIC) [phone] MG tablet Take 1-2 tablets by mouth every 6 (six) hours as needed for headache. 05/12/18 05/12/19  Robyn Haber, MD  EPIPEN 2-PAK 0.3 MG/0.3ML SOAJ injection Inject 0.3 mg into the muscle as needed (for allergic reaction).  05/18/16   [provider]  imipramine (TOFRANIL) 25 MG tablet Take 75 mg by mouth at  bedtime.    [provider]  Respiratory Therapy Supplies (NEBULIZER/TUBING/MOUTHPIECE) KIT Use as directed with nebulizer and medication. 01/18/18   Dara Hoyer, FNP  SYMBICORT 160-4.5 MCG/ACT inhaler INHALE 2 PUFFS INTO THE LUNGS DAILY. 05/09/18   Valentina Shaggy, MD    Family History Family History  Problem Relation Age of Onset  . Thyroid disease Mother     Social History Social History   Tobacco Use  . Smoking status: Never Smoker  . Smokeless tobacco: Never Used  Substance Use Topics  . Alcohol use: Yes    Comment: 03/08/2017 "might have a drink on a holiday"  . Drug use: No     Allergies   Ginger; Passion fruit flavor [flavoring agent]; Peanuts [peanut oil]; Shellfish allergy; and Tamiflu [oseltamivir phosphate]   Review of Systems Review of Systems  Constitutional: Negative for chills and fever.  Eyes: Negative for visual disturbance.  Respiratory: Negative for shortness of breath.   Cardiovascular: Negative for chest pain.  Gastrointestinal: Positive for nausea. Negative for abdominal pain, diarrhea and vomiting.  Neurological: Positive for headaches. Negative for dizziness, tremors, seizures, syncope, speech difficulty, weakness, light-headedness and numbness.  All other systems reviewed and are negative.    Physical Exam Updated Vital Signs BP (!) 146/90 (BP Location: Left Arm)   Pulse 99   Temp 98.2 F (36.8 C) (Oral)   Resp 20   Ht _0  (1.626 m)   Wt 71.7 kg   LMP 02/28/2018   SpO2 100%   BMI 27.12 kg/m   Physical Exam  Constitutional: She appears well-developed and well-nourished. No distress.  HENT:  Head: Normocephalic and atraumatic.  Right Ear: Tympanic membrane is not perforated, not erythematous, not retracted and not bulging.  Left Ear: Tympanic membrane is not perforated, not erythematous, not retracted and not bulging.  Mouth/Throat: Uvula is midline and oropharynx is clear and moist. No dental abscesses. No  oropharyngeal exudate or posterior oropharyngeal erythema.  Posterior oropharynx is symmetric appearing. Patient tolerating own secretions without difficulty. No trismus. No drooling. No hot potato voice. No swelling beneath the tongue, submandibular compartment is soft.    Eyes: Pupils are equal, round, and reactive to light. Conjunctivae and EOM are normal. Right eye exhibits no discharge. Left eye exhibits no discharge.  No proptosis.   Neck: Normal range of motion. Neck supple. No neck rigidity. No edema and no erythema present.  Cardiovascular: Normal rate and regular rhythm.  No murmur heard. Pulmonary/Chest: Breath sounds normal. No respiratory distress. She has no wheezes. She has no rales.  Abdominal: Soft. She exhibits no distension. There is no tenderness.  Lymphadenopathy:    She has no cervical adenopathy.  Neurological: She is alert.  Alert. Clear speech. No facial droop. CNIII-XII grossly intact.  Bilateral upper and lower extremities' sensation grossly intact. 5/5 symmetric strength with grip strength and with plantar and dorsi flexion bilaterally. . Normal finger to nose bilaterally. Negative pronator drift. Gait is steady and intact.   Skin: Skin is warm and dry. No rash noted.  Psychiatric: She has a normal mood and affect. Her behavior is normal.  Nursing note and vitals reviewed.    ED Treatments / Results  Labs (all labs ordered are listed, but only abnormal results are displayed) Labs Reviewed - No data to display  EKG None  Radiology No results found.  Procedures Procedures (including critical care time)  Medications Ordered in ED Medications  sodium chloride 0.9 % bolus 1,000 mL (0 mLs Intravenous Stopped 05/19/18 0123)    And  0.9 %  sodium chloride infusion (has no administration in time range)  ketorolac (TORADOL) 15 MG/ML injection 15 mg (15 mg Intravenous Given 05/19/18 0034)  prochlorperazine (COMPAZINE) injection 10 mg (10 mg Intravenous Given  05/19/18 0034)  dexamethasone (DECADRON) injection 10 mg (10 mg Intravenous Given 05/19/18 0033)     Initial Impression / Assessment and Plan / ED Course  I have reviewed the triage vital signs and the nursing notes.  Pertinent labs & imaging results that were available during my care of the patient were reviewed by me and considered in my medical decision making (see chart for details).    Patient presents with complaint of headache. Patient is nontoxic appearing, vitals with mildly elevated BP, doubt HTN emergency, patient aware of need for recheck. Patient has hx of similar headaches, gradual onset with steady progression in severity- non concerning for [facility], ICH, ischemic CVA, dural venous sinus thrombosis, acute glaucoma, giant cell arteritis, mass, or meningitis. Pt is afebrile with no focal neuro deficits, dizziness, change in vision, proptosis, or nuchal rigidity. Regarding mention of dental etiology, no dental abscess noted, no signs of ludgwigs angina. High suspicion for migraine given patient reports hx of migraines with similar presentation. Patient treated for headache with migraine cocktail with improvement requesting discharge. Suspect I-stat beta hCG is false positive, attempted POC urine preg, but patient requesting DC and does not wish to stay, she is s/p tubal ligation, feel this is reasonable. Will provide prescription for naproxen. I discussed treatment plan, need for PCP/neuro follow-up, and return precautions with the patient. Provided opportunity for questions, patient confirmed understanding and is in agreement with plan.    Final Clinical Impressions(s) / ED Diagnoses   Final diagnoses:  Nonintractable headache, unspecified chronicity pattern, unspecified headache type    ED Discharge Orders         Ordered    naproxen (NAPROSYN) 500 MG tablet  2 times daily     05/19/18 0130           Cordarious Zeek, Glynda Jaeger, PA-C 05/19/18 0701    Fatima Blank,  MD 05/20/18 [phone]

## 2018-05-18 NOTE — ED Triage Notes (Signed)
Patient states that she has had an ongoing migraine x3 weeks. States that she is suppose to have a tooth worked on on Tuesday, but states her tooth doesn't hurt and she does not think this is related to her HA. Currently receivs injections for HA monthly, taking ibuprofen, and taking imiprimine with no relief.

## 2018-05-19 LAB — I-STAT BETA HCG BLOOD, ED (MC, WL, AP ONLY): I-stat hCG, quantitative: 6.7 m[IU]/mL — ABNORMAL HIGH (ref <5)

## 2018-05-19 MED ORDER — NAPROXEN 500 MG PO TABS: 500.0000 mg | ORAL_TABLET | Freq: Two times a day (BID) | ORAL | 0 refills | Status: DC

## 2018-05-19 NOTE — Discharge Instructions (Signed)
You were seen in the emergency department this evening for a headache.  We are sending you with a prescription for naproxen to help to the headache return.  - Naproxen is a nonsteroidal anti-inflammatory medication that will help with pain and swelling. Be sure to take this medication as prescribed with food, 1 pill every 12 hours,  It should be taken with food, as it can cause stomach upset, and more seriously, stomach bleeding. Do not take other nonsteroidal anti-inflammatory medications with this such as Advil, Motrin, Aleve, Mobic, Goodie Powder, or Motrin.    You make take Tylenol per over the counter dosing with these medications.   We have prescribed you new medication(s) today. Discuss the medications prescribed today with your pharmacist as they can have adverse effects and interactions with your other medicines including over the counter and prescribed medications. Seek medical evaluation if you start to experience new or abnormal symptoms after taking one of these medicines, seek care immediately if you start to experience difficulty breathing, feeling of your throat closing, facial swelling, or rash as these could be indications of a more serious allergic reaction.  We would like you to call your neurologist to discuss your headaches, please follow-up within 3 days.  Return to the ER for new or worsening symptoms or any other concerns.

## 2018-05-29 ENCOUNTER — Ambulatory Visit: Payer: Managed Care, Other (non HMO) | Admitting: Allergy & Immunology

## 2018-05-31 ENCOUNTER — Other Ambulatory Visit: Payer: Self-pay | Admitting: Allergy & Immunology

## 2018-08-13 ENCOUNTER — Encounter (HOSPITAL_COMMUNITY): Payer: Self-pay

## 2018-08-13 ENCOUNTER — Other Ambulatory Visit: Payer: Self-pay

## 2018-08-13 ENCOUNTER — Ambulatory Visit (HOSPITAL_COMMUNITY)
Admission: EM | Admit: 2018-08-13 | Discharge: 2018-08-13 | Disposition: A | Discharge status: Home / Self Care | Payer: Managed Care, Other (non HMO) | Attending: Family Medicine | Admitting: Family Medicine

## 2018-08-13 DIAGNOSIS — J069 Acute upper respiratory infection, unspecified: Secondary | ICD-10-CM | POA: Diagnosis not present

## 2018-08-13 DIAGNOSIS — R030 Elevated blood-pressure reading, without diagnosis of hypertension: Secondary | ICD-10-CM | POA: Diagnosis present

## 2018-08-13 DIAGNOSIS — B9789 Other viral agents as the cause of diseases classified elsewhere: Secondary | ICD-10-CM | POA: Diagnosis not present

## 2018-08-13 LAB — POCT RAPID STREP A: Streptococcus, Group A Screen (Direct): NEGATIVE

## 2018-08-13 MED ORDER — CETIRIZINE HCL 10 MG PO TABS: 10.0000 mg | ORAL_TABLET | Freq: Every day | ORAL | 0 refills | Status: DC

## 2018-08-13 MED ORDER — PHENOL 1.4 % MT LIQD: 1.0000 | OROMUCOSAL | 1 refills | Status: DC | PRN

## 2018-08-13 MED ORDER — BENZONATATE 100 MG PO CAPS: 100.0000 mg | ORAL_CAPSULE | Freq: Three times a day (TID) | ORAL | 0 refills | Status: DC

## 2018-08-13 MED ORDER — FLUTICASONE PROPIONATE 50 MCG/ACT NA SUSP: 2.0000 | Freq: Every day | NASAL | 0 refills | Status: DC

## 2018-08-13 NOTE — ED Triage Notes (Signed)
Pt cc sore throat x 1 day.  

## 2018-08-13 NOTE — ED Provider Notes (Signed)
[place]   [id number] 08/13/18 Arrival Time: 1908   CC: URI symptoms   SUBJECTIVE: History from: patient.  Sandra Moody is a 48 y.o. female who presents with abrupt onset of nasal congestion, sneezing, runny nose, PND, sore throat, and cough x 1-2 days.  Denies positive sick exposure or precipitating event.  Has NOT tried OTC medications.  Symptoms are made worse with swallowing.  Reports previous symptoms in the past.  Complains of associated chills, and fatigue. Denies fever, sinus pain, SOB, wheezing, chest pain, nausea, changes in bowel or bladder habits.    Received flu shot this year: yes.  ROS: As per HPI.  Past Medical History:  Diagnosis Date  . Acute renal disease   . Asthma   . Daily headache    (03/08/2017)  . GERD (gastroesophageal reflux disease)   . H pylori ulcer ~ 08/2016  . History of hiatal hernia   . Leaky heart valve ~ 09/2016  . Migraine    "maybe weekly" (03/08/2017)  . PONV (postoperative nausea and vomiting)    Past Surgical History:  Procedure Laterality Date  . CHOLECYSTECTOMY N/A 03/08/2017   Procedure: LAPAROSCOPIC CHOLECYSTECTOMY;  Surgeon: Coralie Keens, MD;  Location: [place] [facility];  Service: General;  Laterality: N/A;  . DILATION AND CURETTAGE OF UTERUS    . LAPAROSCOPIC CHOLECYSTECTOMY  03/08/2017  . TUBAL LIGATION     Allergies  Allergen Reactions  . Ginger Itching and Swelling  . Passion Fruit Flavor [Flavoring Agent] Itching and Swelling  . Peanuts [Peanut Oil] Anaphylaxis    PT ALLERGIC TO ALL TREE NUTS  . Shellfish Allergy Anaphylaxis  . Tamiflu [Oseltamivir Phosphate]    No current facility-administered medications on file prior to encounter.    Current Outpatient Medications on File Prior to Encounter  Medication Sig Dispense Refill  . albuterol (PROVENTIL HFA;VENTOLIN HFA) 108 (90 Base) MCG/ACT inhaler Inhale 1-2 puffs into the lungs every 6 (six) hours as needed for wheezing. 1 Inhaler 0  . albuterol (PROVENTIL) (2.5  MG/3ML) 0.083% nebulizer solution Inhale 3 mLs (2.5 mg total) into the lungs every 6 (six) hours as needed. 120 vial 1  . amLODipine (NORVASC) 2.5 MG tablet Take 2.5 mg by mouth daily.  0  . azelastine (ASTELIN) 0.1 % nasal spray PLACE 2 SPRAYS INTO BOTH NOSTRILS 2 (TWO) TIMES DAILY. 30 mL 0  . butalbital-acetaminophen-caffeine (FIORICET, ESGIC) 50-325-40 MG tablet Take 1-2 tablets by mouth every 6 (six) hours as needed for headache. 10 tablet 0  . EPIPEN 2-PAK 0.3 MG/0.3ML SOAJ injection Inject 0.3 mg into the muscle as needed (for allergic reaction).   2  . imipramine (TOFRANIL) 25 MG tablet Take 75 mg by mouth at bedtime.    [facility] Respiratory Therapy Supplies (NEBULIZER/TUBING/MOUTHPIECE) KIT Use as directed with nebulizer and medication. 5 each 5  . SYMBICORT 160-4.5 MCG/ACT inhaler INHALE 2 PUFFS INTO THE LUNGS DAILY. 10.2 Inhaler 0   Social History   Socioeconomic History  . Marital status: Divorced    Spouse name: Not on file  . Number of children: Not on file  . Years of education: Not on file  . Highest education level: Not on file  Occupational History  . Not on file  Social Needs  . Financial resource strain: Not on file  . Food insecurity:    Worry: Not on file    Inability: Not on file  . Transportation needs:    Medical: Not on file    Non-medical: Not on file  Tobacco Use  . Smoking status: Never Smoker  . Smokeless tobacco: Never Used  Substance and Sexual Activity  . Alcohol use: Yes    Comment: 03/08/2017 "might have a drink on a holiday"  . Drug use: No  . Sexual activity: Yes    Partners: Male    Birth control/protection: None, Pill  Lifestyle  . Physical activity:    Days per week: Not on file    Minutes per session: Not on file  . Stress: Not on file  Relationships  . Social connections:    Talks on phone: Not on file    Gets together: Not on file    Attends religious service: Not on file    Active member of club or organization: Not on file    Attends  meetings of clubs or organizations: Not on file    Relationship status: Not on file  . Intimate partner violence:    Fear of current or ex partner: Not on file    Emotionally abused: Not on file    Physically abused: Not on file    Forced sexual activity: Not on file  Other Topics Concern  . Not on file  Social History Narrative   ** Merged History Encounter **       Family History  Problem Relation Age of Onset  . Thyroid disease Mother     OBJECTIVE:  Vitals:   08/13/18 2002 08/13/18 2005 08/13/18 2045  BP:  (!) 157/101 (!) 106/55  Pulse:  86   Resp:  18   Temp:  98.6 F (37 C)   TempSrc:  Oral   SpO2:  99%   Weight: 150 lb (68 kg)       General appearance: alert; appears mildly fatigued, but nontoxic; speaking in full sentences and tolerating own secretions HEENT: NCAT; Ears: EACs clear, TMs pearly gray; Eyes: PERRL.  EOM grossly intact. Sinuses: nontender; Nose: nares patent with clear rhinorrhea, nares erythematous and swollen, Throat: oropharynx clear, tonsils non erythematous or enlarged, uvula midline  Neck: supple without LAD Lungs: unlabored respirations, symmetrical air entry; cough: absent; no respiratory distress; CTAB Heart: regular rate and rhythm.  Radial pulses 2+ symmetrical bilaterally Skin: warm and dry Psychological: alert and cooperative; normal mood and affect  LABS:   Results for orders placed or performed during the hospital encounter of 08/13/18 (from the past 24 hour(s))  POCT rapid strep A [facility])     Status: None   Collection Time: 08/13/18  8:23 PM  Result Value Ref Range   Streptococcus, Group A Screen (Direct) NEGATIVE NEGATIVE     ASSESSMENT & PLAN:  1. Viral URI with cough   2. Elevated blood pressure reading     Meds ordered this encounter  Medications  . cetirizine (ZYRTEC) 10 MG tablet    Sig: Take 1 tablet (10 mg total) by mouth daily.    Dispense:  20 tablet    Refill:  0    Order Specific Question:    Supervising Provider    Answer:   Raylene Everts [[id number]]  . fluticasone (FLONASE) 50 MCG/ACT nasal spray    Sig: Place 2 sprays into both nostrils daily.    Dispense:  16 g    Refill:  0    Order Specific Question:   Supervising Provider    Answer:   Raylene Everts [[id number]]  . benzonatate (TESSALON) 100 MG capsule    Sig: Take 1 capsule (100 mg total) by mouth  every 8 (eight) hours.    Dispense:  21 capsule    Refill:  0    Order Specific Question:   Supervising Provider    Answer:   Raylene Everts [[id number]]  . phenol (CHLORASEPTIC) 1.4 % LIQD    Sig: Use as directed 1 spray in the mouth or throat as needed for throat irritation / pain.    Dispense:  20 mL    Refill:  1    Order Specific Question:   Supervising Provider    Answer:   Raylene Everts [[id number]]   Strep was negative.  Culture sent.  We will call you with abnormal results Get plenty of rest and push fluids Throat spray as needed for sore throat Tessalon Perles prescribed for cough Zyrtec prescribed for nasal congestion, runny nose, and/or sore throat Flonase prescribed for nasal congestion and runny nose Use medications daily for symptom relief Use OTC medications like ibuprofen or tylenol as needed fever or pain Follow up with PCP or with community Health if symptoms persist Return or go to ER if you have any new or worsening symptoms fever, chills, nausea, vomiting, chest pain, cough, shortness of breath, wheezing, abdominal pain, changes in bowel or bladder habits, etc...  Blood pressure elevated in office.  Please recheck in 24 hours.  If it continues to be greater than 140/90 please follow up with PCP for further evaluation and management.    Reviewed expectations re: course of current medical issues. Questions answered. Outlined signs and symptoms indicating need for more acute intervention. Patient verbalized understanding. After Visit Summary given.         Lestine Box,  PA-C 08/13/18 2048

## 2018-08-13 NOTE — Discharge Instructions (Signed)
Strep was negative.  Culture sent.  We will call you with abnormal results Get plenty of rest and push fluids Throat spray as needed for sore throat Tessalon Perles prescribed for cough Zyrtec prescribed for nasal congestion, runny nose, and/or sore throat Flonase prescribed for nasal congestion and runny nose Use medications daily for symptom relief Use OTC medications like ibuprofen or tylenol as needed fever or pain Follow up with PCP or with community Health if symptoms persist Return or go to ER if you have any new or worsening symptoms fever, chills, nausea, vomiting, chest pain, cough, shortness of breath, wheezing, abdominal pain, changes in bowel or bladder habits, etc...  Blood pressure elevated in office.  Please recheck in 24 hours.  If it continues to be greater than 140/90 please follow up with PCP for further evaluation and management.

## 2018-08-16 LAB — CULTURE, GROUP A STREP (THRC)

## 2018-08-29 ENCOUNTER — Encounter: Payer: Self-pay | Admitting: Neurology

## 2018-08-29 ENCOUNTER — Ambulatory Visit (INDEPENDENT_AMBULATORY_CARE_PROVIDER_SITE_OTHER): Payer: Managed Care, Other (non HMO) | Admitting: Neurology

## 2018-08-29 DIAGNOSIS — G43019 Migraine without aura, intractable, without status migrainosus: Secondary | ICD-10-CM | POA: Diagnosis not present

## 2018-08-29 DIAGNOSIS — G43009 Migraine without aura, not intractable, without status migrainosus: Secondary | ICD-10-CM | POA: Insufficient documentation

## 2018-08-29 HISTORY — DX: Migraine without aura, intractable, without status migrainosus: G43.019

## 2018-08-29 MED ORDER — RIZATRIPTAN BENZOATE 10 MG PO TABS: 10.0000 mg | ORAL_TABLET | Freq: Three times a day (TID) | ORAL | 3 refills | Status: DC | PRN

## 2018-08-29 NOTE — Progress Notes (Signed)
Reason for visit: Migraine headache  Referring physician: Dr. Beatrix Shipper Sandra Moody is a 48 y.o. female  History of present illness:  Sandra Moody is a 48 year old black female with a history of migraine headaches since she was a teenager.  The patient denies a family history of migraine however.  She does have a history of irritable bowel syndrome.  She has been followed through the [facility], she has in the past been placed on Topamax and now is on imipramine taking 100 mg at night.  The patient has baclofen and chlorpromazine to take if needed for the headache, but these medications result in drowsiness.  The patient is able to sleep, this does help the headache.  The patient has been receiving nerve block shots on the head and neck every 6 to 8 weeks which seems to have helped.  The patient was having daily headaches a year and a half ago, but now she is having only about 2 headaches a month.  The headaches however may last a day or 2.  The patient has not missed work recently because of the headache.  The headaches may start in the front and then on the top of the head.  They may be unilateral at times.  The patient reports some sinus issues at times.  She has some blurring of vision, she may have some nausea and photophobia and phonophobia with the headache.  Occasionally she may have some dizziness.  She denies any numbness or weakness of the face, arms, legs.  She claims that if she gets cold, this may bring on a headache.  She drinks usually on average 1 cup of coffee and one ice tea daily.  She is on Depo-Provera shots for birth control.  She does not believe that this has resulted in any negative impact on her headache.  The patient denies any significant neck pain.  She is sent to this office for an evaluation.  Past Medical History:  Diagnosis Date  . Acute renal disease   . Asthma   . Daily headache    (03/08/2017)  . GERD (gastroesophageal reflux disease)   . H pylori  ulcer ~ 08/2016  . History of hiatal hernia   . Leaky heart valve ~ 09/2016  . Migraine    "maybe weekly" (03/08/2017)  . PONV (postoperative nausea and vomiting)     Past Surgical History:  Procedure Laterality Date  . CHOLECYSTECTOMY N/A 03/08/2017   Procedure: LAPAROSCOPIC CHOLECYSTECTOMY;  Surgeon: Coralie Keens, MD;  Location: [place];  Service: General;  Laterality: N/A;  . DILATION AND CURETTAGE OF UTERUS    . LAPAROSCOPIC CHOLECYSTECTOMY  03/08/2017  . TUBAL LIGATION      Family History  Problem Relation Age of Onset  . Thyroid disease Mother     Social history:  reports that she has never smoked. She has never used smokeless tobacco. She reports current alcohol use. She reports that she does not use drugs.  Medications:  Prior to Admission medications   Medication Sig Start Date End Date Taking? Authorizing Provider  amLODipine (NORVASC) 2.5 MG tablet Take 2.5 mg by mouth daily. 08/16/17  Yes [provider]  baclofen (LIORESAL) 10 MG tablet Take 10 mg by mouth 3 (three) times daily.   Yes [provider]  chlorproMAZINE (THORAZINE) 25 MG tablet Take 25 mg by mouth 3 (three) times daily.   Yes [provider]  EPIPEN 2-PAK 0.3 MG/0.3ML SOAJ injection Inject 0.3  mg into the muscle as needed (for allergic reaction).  05/18/16  Yes [provider]  imipramine (TOFRANIL-PM) 100 MG capsule Take 100 mg by mouth at bedtime.   Yes [provider]  montelukast (SINGULAIR) 10 MG tablet Take 10 mg by mouth at bedtime.   Yes [provider]  Respiratory Therapy Supplies (NEBULIZER/TUBING/MOUTHPIECE) KIT Use as directed with nebulizer and medication. 01/18/18  Yes Ambs, Kathrine Cords, FNP  SYMBICORT 160-4.5 MCG/ACT inhaler INHALE 2 PUFFS INTO THE LUNGS DAILY. 05/09/18  Yes Valentina Shaggy, MD      Allergies  Allergen Reactions  . Ginger Itching and Swelling  . Passion Fruit Flavor [Flavoring Agent] Itching and Swelling  . Peanuts  [Peanut Oil] Anaphylaxis    PT ALLERGIC TO ALL TREE NUTS  . Shellfish Allergy Anaphylaxis  . Tamiflu [Oseltamivir Phosphate]     ROS:  Out of a complete 14 system review of symptoms, the patient complains only of the following symptoms, and all other reviewed systems are negative.  Constipation Feeling hot, cold Joint pain, joint swelling, aching muscles Allergies Headache  Blood pressure 133/86, pulse (!) 108, height 5' 4"  (1.626 m), weight 159 lb 5 oz (72.3 kg), last menstrual period 08/13/2018.  Physical Exam  General: The patient is alert and cooperative at the time of the examination.  Eyes: Pupils are equal, round, and reactive to light. Discs are flat bilaterally.  Good venous pulsations are seen.  Neck: The neck is supple, no carotid bruits are noted.  Respiratory: The respiratory examination is clear.  Cardiovascular: The cardiovascular examination reveals a regular rate and rhythm, no obvious murmurs or rubs are noted.  Neuromuscular: The patient has good range of movement the cervical spine, no crepitus is noted in the temporomandibular joints.  Skin: Extremities are without significant edema.  Neurologic Exam  Mental status: The patient is alert and oriented x 3 at the time of the examination. The patient has apparent normal recent and remote memory, with an apparently normal attention span and concentration ability.  Cranial nerves: Facial symmetry is present. There is good sensation of the face to pinprick and soft touch bilaterally. The strength of the facial muscles and the muscles to head turning and shoulder shrug are normal bilaterally. Speech is well enunciated, no aphasia or dysarthria is noted. Extraocular movements are full. Visual fields are full. The tongue is midline, and the patient has symmetric elevation of the soft palate. No obvious hearing deficits are noted.  Motor: The motor testing reveals 5 over 5 strength of all 4 extremities. Good  symmetric motor tone is noted throughout.  Sensory: Sensory testing is intact to pinprick, soft touch, vibration sensation, and position sense on all 4 extremities. No evidence of extinction is noted.  Coordination: Cerebellar testing reveals good finger-nose-finger and heel-to-shin bilaterally.  Gait and station: Gait is normal. Tandem gait is normal. Romberg is negative. No drift is seen.  Reflexes: Deep tendon reflexes are symmetric and normal bilaterally. Toes are downgoing bilaterally.   Assessment/Plan:  1.  Common migraine headache, intractable  The patient seems to have gained some benefit with the imipramine, her heart rate is 108 today, the dose cannot be increased.  If needed, in addition of propranolol may complement the imipramine.  The patient has never tried any triptan medication for her headache previously.  A prescription will be given for Maxalt.  The patient claims that Advil or Excedrin Migraine have not helped in the past.  She will follow-up here in about  3 months.  Jill Alexanders MD 08/29/2018 8:28 AM  [facility] [address] [place], [place] [address]  Phone [phone] Fax [phone]

## 2018-08-29 NOTE — Patient Instructions (Signed)
Take the maxalt 10 mg if needed for the headache.

## 2018-09-17 ENCOUNTER — Ambulatory Visit (INDEPENDENT_AMBULATORY_CARE_PROVIDER_SITE_OTHER): Payer: Managed Care, Other (non HMO)

## 2018-09-17 ENCOUNTER — Other Ambulatory Visit: Payer: Self-pay

## 2018-09-17 ENCOUNTER — Ambulatory Visit (INDEPENDENT_AMBULATORY_CARE_PROVIDER_SITE_OTHER): Payer: Managed Care, Other (non HMO) | Admitting: Family Medicine

## 2018-09-17 ENCOUNTER — Encounter: Payer: Self-pay | Admitting: Family Medicine

## 2018-09-17 VITALS — BP 126/75 | HR 113 | Temp 98.9°F | Resp 20 | Ht 64.17 in | Wt 156.4 lb

## 2018-09-17 DIAGNOSIS — R7309 Other abnormal glucose: Secondary | ICD-10-CM

## 2018-09-17 DIAGNOSIS — Z862 Personal history of diseases of the blood and blood-forming organs and certain disorders involving the immune mechanism: Secondary | ICD-10-CM | POA: Diagnosis not present

## 2018-09-17 DIAGNOSIS — M79642 Pain in left hand: Secondary | ICD-10-CM | POA: Diagnosis not present

## 2018-09-17 DIAGNOSIS — R7989 Other specified abnormal findings of blood chemistry: Secondary | ICD-10-CM

## 2018-09-17 DIAGNOSIS — M791 Myalgia, unspecified site: Secondary | ICD-10-CM

## 2018-09-17 DIAGNOSIS — M79641 Pain in right hand: Secondary | ICD-10-CM | POA: Diagnosis not present

## 2018-09-17 DIAGNOSIS — R252 Cramp and spasm: Secondary | ICD-10-CM

## 2018-09-17 DIAGNOSIS — M7989 Other specified soft tissue disorders: Secondary | ICD-10-CM

## 2018-09-17 NOTE — Patient Instructions (Signed)
° ° ° °  If you have lab work done today you will be contacted with your lab results within the next 2 weeks.  If you have not heard from us then please contact us. The fastest way to get your results is to register for My Chart. ° ° °IF you received an x-ray today, you will receive an invoice from [facility]. Please contact [facility] at [phone] with questions or concerns regarding your invoice.  ° °IF you received labwork today, you will receive an invoice from [facility]. Please contact [facility] at [phone] with questions or concerns regarding your invoice.  ° °Our billing staff will not be able to assist you with questions regarding bills from these companies. ° °You will be contacted with the lab results as soon as they are available. The fastest way to get your results is to activate your My Chart account. Instructions are located on the last page of this paperwork. If you have not heard from us regarding the results in 2 weeks, please contact this office. °  ° ° ° °

## 2018-09-17 NOTE — Progress Notes (Signed)
2/18/20202:11 PM  BRYCELYN GAMBINO 04/30/1971, 48 y.o. female [id number]  Chief Complaint  Patient presents with  . Establish Care    X3 mth-pt states pain, hands,fingerand  legs,     HPI:   Patient is a 48 y.o. female with past medical history significant for HTN, migraines, asthma, GERD, biliary dyskinesia,  who presents today for pain in her hands and twitches of her right thigh and sometimes her right shoulder  Swelling in all her fingers, painful, very dry Denies joints being red or warm  She is having temperature instability She denies any peeling, skin lesions Her left middle finger occ gets stuck Has been using asper cream and heating pads Sometimes has some numbness at her tips when she wakes up Had to takes off her rings Feels her muscles and hands are very tight in the morning But she feels weak at night Feels she gets very dry, makes it hard to swallow   On right thigh mostly, occ right shoulder Has twitching, painful muscles Shows video which shows quad muscle contractions She hardly drinks water Denies any fhx autoimmune or neurological disorders   Lab Results  Component Value Date   CREATININE 0.80 02/24/2018   Lab Results  Component Value Date   NA 142 02/24/2018   K 3.0 (L) 02/24/2018   CL 101 02/24/2018   CO2 21 (L) 08/22/2017   Lab Results  Component Value Date   WBC 8.0 08/22/2017   HGB 11.9 (L) 02/24/2018   HCT 35.0 (L) 02/24/2018   MCV 94.3 08/22/2017   PLT 359 08/22/2017     Fall Risk  09/17/2018 03/16/2017 01/25/2017 11/23/2016 11/20/2016  Falls in the past year? 0 No No No No  Number falls in past yr: 0 - - - -  Injury with Fall? 0 - - - -  Follow up Falls evaluation completed - - - -     Depression screen [facility] 2/9 09/17/2018 03/16/2017 01/25/2017  Decreased Interest 0 0 0  Down, Depressed, Hopeless 0 0 0  PHQ - 2 Score 0 0 0    Allergies  Allergen Reactions  . Ginger Itching and Swelling  . Passion Fruit Flavor [Flavoring  Agent] Itching and Swelling  . Peanuts [Peanut Oil] Anaphylaxis    PT ALLERGIC TO ALL TREE NUTS  . Shellfish Allergy Anaphylaxis  . Tamiflu [Oseltamivir Phosphate]     Prior to Admission medications   Medication Sig Start Date End Date Taking? Authorizing Provider  ALBUTEROL SULFATE PO Take by mouth.   Yes [provider]  amLODipine (NORVASC) 2.5 MG tablet Take 2.5 mg by mouth daily. 08/16/17  Yes [provider]  baclofen (LIORESAL) 10 MG tablet Take 10 mg by mouth 3 (three) times daily.   Yes [provider]  chlorproMAZINE (THORAZINE) 25 MG tablet Take 25 mg by mouth 3 (three) times daily.   Yes [provider]  EPIPEN 2-PAK 0.3 MG/0.3ML SOAJ injection Inject 0.3 mg into the muscle as needed (for allergic reaction).  05/18/16  Yes [provider]  imipramine (TOFRANIL-PM) 100 MG capsule Take 100 mg by mouth at bedtime.   Yes [provider]  montelukast (SINGULAIR) 10 MG tablet Take 10 mg by mouth at bedtime.   Yes [provider]  Respiratory Therapy Supplies (NEBULIZER/TUBING/MOUTHPIECE) KIT Use as directed with nebulizer and medication. 01/18/18  Yes Ambs, Kathrine Cords, FNP  rizatriptan (MAXALT) 10 MG tablet Take 1 tablet (10 mg total) by mouth 3 (three) times daily  as needed for migraine. 08/29/18  Yes Kathrynn Ducking, MD  SYMBICORT 160-4.5 MCG/ACT inhaler INHALE 2 PUFFS INTO THE LUNGS DAILY. 05/09/18  Yes Valentina Shaggy, MD    Past Medical History:  Diagnosis Date  . Acute renal disease   . Asthma   . Common migraine with intractable migraine 08/29/2018  . Daily headache    (03/08/2017)  . GERD (gastroesophageal reflux disease)   . H pylori ulcer ~ 08/2016  . History of hiatal hernia   . Leaky heart valve ~ 09/2016  . Migraine    "maybe weekly" (03/08/2017)  . PONV (postoperative nausea and vomiting)     Past Surgical History:  Procedure Laterality Date  . CHOLECYSTECTOMY N/A 03/08/2017   Procedure: LAPAROSCOPIC  CHOLECYSTECTOMY;  Surgeon: Coralie Keens, MD;  Location: [place];  Service: General;  Laterality: N/A;  . DILATION AND CURETTAGE OF UTERUS    . LAPAROSCOPIC CHOLECYSTECTOMY  03/08/2017  . TUBAL LIGATION      Social History   Tobacco Use  . Smoking status: Never Smoker  . Smokeless tobacco: Never Used  Substance Use Topics  . Alcohol use: Yes    Comment: 03/08/2017 "might have a drink on a holiday"    Family History  Problem Relation Age of Onset  . Thyroid disease Mother   . Diabetes Maternal Grandmother   . Heart disease Maternal Grandfather     ROS Per hpi  OBJECTIVE:  Blood pressure 126/75, pulse (!) 113, temperature 98.9 F (37.2 C), temperature source Oral, resp. rate 20, height 5' 4.17" (1.63 m), weight 156 lb 6.4 oz (70.9 kg), SpO2 98 %. Body mass index is 26.7 kg/m.   Physical Exam Vitals signs and nursing note reviewed.  Constitutional:      Appearance: She is well-developed.  HENT:     Head: Normocephalic and atraumatic.     Mouth/Throat:     Pharynx: No oropharyngeal exudate.  Eyes:     General: No scleral icterus.    Conjunctiva/sclera: Conjunctivae normal.     Pupils: Pupils are equal, round, and reactive to light.  Neck:     Musculoskeletal: Neck supple.  Cardiovascular:     Rate and Rhythm: Normal rate and regular rhythm.     Heart sounds: Normal heart sounds. No murmur. No friction rub. No gallop.   Pulmonary:     Effort: Pulmonary effort is normal.     Breath sounds: Normal breath sounds. No wheezing or rales.  Musculoskeletal:     Right hand: She exhibits decreased range of motion (unable to close grip), disruption of two-point discrimination and swelling.     Left hand: She exhibits decreased range of motion (unable to close grip), disruption of two-point discrimination and swelling. Normal strength noted.  Skin:    General: Skin is warm and dry.  Neurological:     Mental Status: She is alert and oriented to person, place, and time.      Cranial Nerves: Cranial nerves are intact.     Motor: Motor function is intact.     Coordination: Coordination is intact.     Gait: Gait is intact.     Deep Tendon Reflexes: Reflexes are normal and symmetric. Babinski sign absent on the right side.     Comments: Neg BUE phalen and tinel signs    Dg Hand Complete Right  Result Date: 09/17/2018 CLINICAL DATA:  Bilateral hand pain and swelling without known injury. EXAM: RIGHT HAND - COMPLETE 3+ VIEW COMPARISON:  Radiographs of  August 27, 2007. FINDINGS: There is no evidence of fracture or dislocation. There is no evidence of arthropathy or other focal bone abnormality. Soft tissues are unremarkable. IMPRESSION: Negative. Electronically Signed   By: Marijo Conception, M.D.   On: 09/17/2018 15:06     ASSESSMENT and PLAN  1. Bilateral hand swelling xrays normal, mild swelling across MCP and PIP joints. Labs to r/o autoimmune process.  - DG Hand Complete Right; Future - DG Hand Complete Left; Future - CBC with Differential/Platelet - Sedimentation Rate - C-reactive protein - Rheumatoid factor - ANA w/Reflex if Positive - TSH  2. Muscle cramps 3. Muscle pain H/o hypokalemia. Checking labs today. Discussed importance of hydration.  - CMP14+EGFR - Magnesium - TSH  4. H/O normocytic normochromic anemia - Iron, TIBC and Ferritin Panel    Return in about 4 weeks (around 10/15/2018).    Rutherford Guys, MD [facility] [place], [place] [address] Ph.  [phone] Fax [phone]

## 2018-09-18 LAB — IRON,TIBC AND FERRITIN PANEL
Ferritin: 181 ng/mL — ABNORMAL HIGH (ref 15–150)
Iron Saturation: 25 % (ref 15–55)
Iron: 80 ug/dL (ref 27–159)
Total Iron Binding Capacity: 323 ug/dL (ref 250–450)
UIBC: 243 ug/dL (ref 131–425)

## 2018-09-19 LAB — CMP14+EGFR
ALT: 19 IU/L (ref 0–32)
AST: 20 IU/L (ref 0–40)
Albumin/Globulin Ratio: 1.4 (ref 1.2–2.2)
Albumin: 4.3 g/dL (ref 3.8–4.8)
Alkaline Phosphatase: 117 IU/L (ref 39–117)
BUN/Creatinine Ratio: 6 — ABNORMAL LOW (ref 9–23)
BUN: 7 mg/dL (ref 6–24)
Bilirubin Total: <0.2 mg/dL (ref 0.0–1.2)
CO2: 20 mmol/L (ref 20–29)
Calcium: 9.9 mg/dL (ref 8.7–10.2)
Chloride: 105 mmol/L (ref 96–106)
Creatinine, Ser: 1.1 mg/dL — ABNORMAL HIGH (ref 0.57–1.00)
GFR calc Af Amer: 70 mL/min/{1.73_m2} (ref >59)
GFR calc non Af Amer: 61 mL/min/{1.73_m2} (ref >59)
Globulin, Total: 3 g/dL (ref 1.5–4.5)
Glucose: 276 mg/dL — ABNORMAL HIGH (ref 65–99)
Potassium: 3.7 mmol/L (ref 3.5–5.2)
Sodium: 142 mmol/L (ref 134–144)
Total Protein: 7.3 g/dL (ref 6.0–8.5)

## 2018-09-19 LAB — ANA W/REFLEX IF POSITIVE: Anti Nuclear Antibody(ANA): NEGATIVE

## 2018-09-19 LAB — CBC WITH DIFFERENTIAL/PLATELET
Basophils Absolute: 0 10*3/uL (ref 0.0–0.2)
Basos: 1 %
EOS (ABSOLUTE): 0.3 10*3/uL (ref 0.0–0.4)
Eos: 4 %
Hematocrit: 38.8 % (ref 34.0–46.6)
Hemoglobin: 12.7 g/dL (ref 11.1–15.9)
Immature Grans (Abs): 0 10*3/uL (ref 0.0–0.1)
Immature Granulocytes: 0 %
Lymphocytes Absolute: 2.2 10*3/uL (ref 0.7–3.1)
Lymphs: 31 %
MCH: 30.1 pg (ref 26.6–33.0)
MCHC: 32.7 g/dL (ref 31.5–35.7)
MCV: 92 fL (ref 79–97)
Monocytes Absolute: 0.5 10*3/uL (ref 0.1–0.9)
Monocytes: 7 %
Neutrophils Absolute: 4 10*3/uL (ref 1.4–7.0)
Neutrophils: 57 %
Platelets: 379 10*3/uL (ref 150–450)
RBC: 4.22 x10E6/uL (ref 3.77–5.28)
RDW: 12.5 % (ref 11.7–15.4)
WBC: 7 10*3/uL (ref 3.4–10.8)

## 2018-09-19 LAB — MAGNESIUM: Magnesium: 2 mg/dL (ref 1.6–2.3)

## 2018-09-19 LAB — SEDIMENTATION RATE: Sed Rate: 26 mm/hr (ref 0–32)

## 2018-09-19 LAB — C-REACTIVE PROTEIN: CRP: 7 mg/L (ref 0–10)

## 2018-09-19 LAB — RHEUMATOID FACTOR: Rhuematoid fact SerPl-aCnc: <10 IU/mL (ref 0.0–13.9)

## 2018-09-19 LAB — TSH: TSH: 1.49 u[IU]/mL (ref 0.450–4.500)

## 2018-09-24 ENCOUNTER — Telehealth: Payer: Self-pay | Admitting: Family Medicine

## 2018-09-24 NOTE — Telephone Encounter (Signed)
Copied from CRM [id number]. Topic: Quick Communication - See Telephone Encounter >> Sep 24, 2018  4:58 PM Arlyss Gandy, NT wrote: CRM for notification. See Telephone encounter for: 09/24/18. Pt would like a call with her lab results.

## 2018-09-25 NOTE — Telephone Encounter (Signed)
Patient called again to get the status of her lab results.  Please call her as soon as they are available.  [phone]

## 2018-09-27 ENCOUNTER — Ambulatory Visit (INDEPENDENT_AMBULATORY_CARE_PROVIDER_SITE_OTHER): Payer: Managed Care, Other (non HMO) | Admitting: Emergency Medicine

## 2018-09-27 ENCOUNTER — Other Ambulatory Visit: Payer: Self-pay

## 2018-09-27 ENCOUNTER — Encounter: Payer: Self-pay | Admitting: Emergency Medicine

## 2018-09-27 VITALS — BP 123/83 | HR 112 | Temp 98.6°F | Resp 16 | Wt 156.2 lb

## 2018-09-27 DIAGNOSIS — I10 Essential (primary) hypertension: Secondary | ICD-10-CM | POA: Diagnosis not present

## 2018-09-27 DIAGNOSIS — M7989 Other specified soft tissue disorders: Secondary | ICD-10-CM

## 2018-09-27 NOTE — Progress Notes (Signed)
Sandra Moody 48 y.o.   Chief Complaint  Patient presents with  . Hypertension    discuss elevated bp's   BP Readings from Last 3 Encounters:  09/27/18 123/83  09/17/18 126/75  08/29/18 133/86    HISTORY OF PRESENT ILLNESS: This is a 48 y.o. female complaining of intermittent elevation of blood pressure for the past few weeks along with some bilateral hand swelling.  No other significant symptoms.  Seen recently here and had blood work that only showed hyperglycemia.  HPI   Prior to Admission medications   Medication Sig Start Date End Date Taking? Authorizing Provider  ALBUTEROL SULFATE PO Take by mouth.   Yes [provider]  amLODipine (NORVASC) 2.5 MG tablet Take 2.5 mg by mouth daily. 08/16/17  Yes [provider]  baclofen (LIORESAL) 10 MG tablet Take 10 mg by mouth 3 (three) times daily.   Yes [provider]  chlorproMAZINE (THORAZINE) 25 MG tablet Take 25 mg by mouth 3 (three) times daily.   Yes [provider]  EPIPEN 2-PAK 0.3 MG/0.3ML SOAJ injection Inject 0.3 mg into the muscle as needed (for allergic reaction).  05/18/16  Yes [provider]  imipramine (TOFRANIL-PM) 100 MG capsule Take 100 mg by mouth at bedtime.   Yes [provider]  montelukast (SINGULAIR) 10 MG tablet Take 10 mg by mouth at bedtime.   Yes [provider]  Respiratory Therapy Supplies (NEBULIZER/TUBING/MOUTHPIECE) KIT Use as directed with nebulizer and medication. 01/18/18  Yes Ambs, Kathrine Cords, FNP  rizatriptan (MAXALT) 10 MG tablet Take 1 tablet (10 mg total) by mouth 3 (three) times daily as needed for migraine. 08/29/18  Yes Kathrynn Ducking, MD  SYMBICORT 160-4.5 MCG/ACT inhaler INHALE 2 PUFFS INTO THE LUNGS DAILY. 05/09/18  Yes Valentina Shaggy, MD    Allergies  Allergen Reactions  . Ginger Itching and Swelling  . Passion Fruit Flavor [Flavoring Agent] Itching and Swelling  . Peanuts [Peanut Oil] Anaphylaxis    PT ALLERGIC  TO ALL TREE NUTS  . Shellfish Allergy Anaphylaxis  . Tamiflu [Oseltamivir Phosphate]     Patient Active Problem List   Diagnosis Date Noted  . Common migraine with intractable migraine 08/29/2018  . Moderate persistent asthma without complication 61/95/0932  . Seasonal and perennial allergic rhinitis 06/28/2017  . Mild persistent asthma with acute exacerbation 06/28/2017  . Adverse food reaction 06/28/2017  . Gastroesophageal reflux disease 06/28/2017  . Biliary dyskinesia 03/08/2017  . Asthma 03/02/2016  . Asthma with acute exacerbation 10/04/2015  . Pain of finger of right hand 07/19/2015  . Other and unspecified ovarian cyst 06/17/2012  . Pelvic pain in female 06/17/2012  . Bacterial vaginosis 06/17/2012    Past Medical History:  Diagnosis Date  . Acute renal disease   . Asthma   . Common migraine with intractable migraine 08/29/2018  . Daily headache    (03/08/2017)  . GERD (gastroesophageal reflux disease)   . H pylori ulcer ~ 08/2016  . History of hiatal hernia   . Leaky heart valve ~ 09/2016  . Migraine    "maybe weekly" (03/08/2017)  . PONV (postoperative nausea and vomiting)     Past Surgical History:  Procedure Laterality Date  . CHOLECYSTECTOMY N/A 03/08/2017   Procedure: LAPAROSCOPIC CHOLECYSTECTOMY;  Surgeon: Coralie Keens, MD;  Location: [facility];  Service: General;  Laterality: N/A;  . DILATION AND CURETTAGE OF UTERUS    . LAPAROSCOPIC CHOLECYSTECTOMY  03/08/2017  . TUBAL LIGATION  Social History   Socioeconomic History  . Marital status: Divorced    Spouse name: Not on file  . Number of children: 3  . Years of education: Not on file  . Highest education level: 12th grade  Occupational History    Comment: [employer]   Social Needs  . Financial resource strain: Not on file  . Food insecurity:    Worry: Not on file    Inability: Not on file  . Transportation needs:    Medical: Not on file    Non-medical: Not on file  Tobacco Use  .  Smoking status: Never Smoker  . Smokeless tobacco: Never Used  Substance and Sexual Activity  . Alcohol use: Yes    Comment: 03/08/2017 "might have a drink on a holiday"  . Drug use: No  . Sexual activity: Yes    Partners: Male    Birth control/protection: Injection  Lifestyle  . Physical activity:    Days per week: Not on file    Minutes per session: Not on file  . Stress: Not on file  Relationships  . Social connections:    Talks on phone: Not on file    Gets together: Not on file    Attends religious service: Not on file    Active member of club or organization: Not on file    Attends meetings of clubs or organizations: Not on file    Relationship status: Not on file  . Intimate partner violence:    Fear of current or ex partner: Not on file    Emotionally abused: Not on file    Physically abused: Not on file    Forced sexual activity: Not on file  Other Topics Concern  . Not on file  Social History Narrative   ** Merged History Encounter **   Right handed    Lives at home with significant other 2 cups daily of caffeine     Family History  Problem Relation Age of Onset  . Thyroid disease Mother   . Diabetes Maternal Grandmother   . Heart disease Maternal Grandfather      Review of Systems  Constitutional: Negative.  Negative for fever.  HENT: Negative.   Respiratory: Negative.  Negative for cough and shortness of breath.   Cardiovascular: Negative.  Negative for chest pain, palpitations and leg swelling.  Gastrointestinal: Negative.   Genitourinary: Negative.   Musculoskeletal: Negative.   Skin: Negative.  Negative for rash.  Neurological: Negative.   All other systems reviewed and are negative.   Vitals:   09/27/18 1432  BP: 123/83  Pulse: (!) 112  Resp: 16  Temp: 98.6 F (37 C)  SpO2: 98%    Physical Exam Vitals signs reviewed.  Constitutional:      Appearance: Normal appearance.  Eyes:     Extraocular Movements: Extraocular movements intact.      Pupils: Pupils are equal, round, and reactive to light.  Neck:     Musculoskeletal: Neck supple.  Cardiovascular:     Rate and Rhythm: Normal rate and regular rhythm.     Heart sounds: Normal heart sounds.  Pulmonary:     Effort: Pulmonary effort is normal.     Breath sounds: Normal breath sounds.  Musculoskeletal:     Comments: Hands: Mild fingers swelling bilaterally  Skin:    General: Skin is warm and dry.     Capillary Refill: Capillary refill takes less than 2 seconds.  Neurological:     General: No focal  deficit present.     Mental Status: She is alert and oriented to person, place, and time.  Psychiatric:        Mood and Affect: Mood normal.        Behavior: Behavior normal.      ASSESSMENT & PLAN: Sandra Moody was seen today for hypertension.  Diagnoses and all orders for this visit:  Bilateral hand swelling  Reactive hypertension   Patient Instructions       If you have lab work done today you will be contacted with your lab results within the next 2 weeks.  If you have not heard from [place] then please contact us. The fastest way to get your results is to register for My Chart.   IF you received an x-ray today, you will receive an invoice from [facility]. Please contact [facility] at [phone] with questions or concerns regarding your invoice.   IF you received labwork today, you will receive an invoice from [facility]. Please contact [facility] at [phone] with questions or concerns regarding your invoice.   Our billing staff will not be able to assist you with questions regarding bills from these companies.  You will be contacted with the lab results as soon as they are available. The fastest way to get your results is to activate your My Chart account. Instructions are located on the last page of this paperwork. If you have not heard from [place] regarding the results in 2 weeks, please contact this office.    Health Maintenance,  Female Adopting a healthy lifestyle and getting preventive care can go a long way to promote health and wellness. Talk with your health care provider about what schedule of regular examinations is right for you. This is a good chance for you to check in with your provider about disease prevention and staying healthy. In between checkups, there are plenty of things you can do on your own. Experts have done a lot of research about which lifestyle changes and preventive measures are most likely to keep you healthy. Ask your health care provider for more information. Weight and diet Eat a healthy diet  Be sure to include plenty of vegetables, fruits, low-fat dairy products, and lean protein.  Do not eat a lot of foods high in solid fats, added sugars, or salt.  Get regular exercise. This is one of the most important things you can do for your health. ? Most adults should exercise for at least 150 minutes each week. The exercise should increase your heart rate and make you sweat (moderate-intensity exercise). ? Most adults should also do strengthening exercises at least twice a week. This is in addition to the moderate-intensity exercise. Maintain a healthy weight  Body mass index (BMI) is a measurement that can be used to identify possible weight problems. It estimates body fat based on height and weight. Your health care provider can help determine your BMI and help you achieve or maintain a healthy weight.  For females 31 years of age and older: ? A BMI below 18.5 is considered underweight. ? A BMI of 18.5 to 24.9 is normal. ? A BMI of 25 to 29.9 is considered overweight. ? A BMI of 30 and above is considered obese. Watch levels of cholesterol and blood lipids  You should start having your blood tested for lipids and cholesterol at 46 years of age, then have this test every 5 years.  You may need to have your cholesterol levels checked more often if: ?  Your lipid or cholesterol levels are  high. ? You are older than 46 years of age. ? You are at high risk for heart disease. Cancer screening Lung Cancer  Lung cancer screening is recommended for adults 73-38 years old who are at high risk for lung cancer because of a history of smoking.  A yearly low-dose CT scan of the lungs is recommended for people who: ? Currently smoke. ? Have quit within the past 15 years. ? Have at least a 30-pack-year history of smoking. A pack year is smoking an average of one pack of cigarettes a day for 1 year.  Yearly screening should continue until it has been 15 years since you quit.  Yearly screening should stop if you develop a health problem that would prevent you from having lung cancer treatment. Breast Cancer  Practice breast self-awareness. This means understanding how your breasts normally appear and feel.  It also means doing regular breast self-exams. Let your health care provider know about any changes, no matter how small.  If you are in your 20s or 30s, you should have a clinical breast exam (CBE) by a health care provider every 1-3 years as part of a regular health exam.  If you are 78 or older, have a CBE every year. Also consider having a breast X-ray (mammogram) every year.  If you have a family history of breast cancer, talk to your health care provider about genetic screening.  If you are at high risk for breast cancer, talk to your health care provider about having an MRI and a mammogram every year.  Breast cancer gene (BRCA) assessment is recommended for women who have family members with BRCA-related cancers. BRCA-related cancers include: ? Breast. ? Ovarian. ? Tubal. ? Peritoneal cancers.  Results of the assessment will determine the need for genetic counseling and BRCA1 and BRCA2 testing. Cervical Cancer Your health care provider may recommend that you be screened regularly for cancer of the pelvic organs (ovaries, uterus, and vagina). This screening involves a  pelvic examination, including checking for microscopic changes to the surface of your cervix (Pap test). You may be encouraged to have this screening done every 3 years, beginning at age 46.  For women ages 64-65, health care providers may recommend pelvic exams and Pap testing every 3 years, or they may recommend the Pap and pelvic exam, combined with testing for human papilloma virus (HPV), every 5 years. Some types of HPV increase your risk of cervical cancer. Testing for HPV may also be done on women of any age with unclear Pap test results.  Other health care providers may not recommend any screening for nonpregnant women who are considered low risk for pelvic cancer and who do not have symptoms. Ask your health care provider if a screening pelvic exam is right for you.  If you have had past treatment for cervical cancer or a condition that could lead to cancer, you need Pap tests and screening for cancer for at least 20 years after your treatment. If Pap tests have been discontinued, your risk factors (such as having a new sexual partner) need to be reassessed to determine if screening should resume. Some women have medical problems that increase the chance of getting cervical cancer. In these cases, your health care provider may recommend more frequent screening and Pap tests. Colorectal Cancer  This type of cancer can be detected and often prevented.  Routine colorectal cancer screening usually begins at 46 years of age and continues  through 46 years of age.  Your health care provider may recommend screening at an earlier age if you have risk factors for colon cancer.  Your health care provider may also recommend using home test kits to check for hidden blood in the stool.  A small camera at the end of a tube can be used to examine your colon directly (sigmoidoscopy or colonoscopy). This is done to check for the earliest forms of colorectal cancer.  Routine screening usually begins at age  63.  Direct examination of the colon should be repeated every 5-10 years through 46 years of age. However, you may need to be screened more often if early forms of precancerous polyps or small growths are found. Skin Cancer  Check your skin from head to toe regularly.  Tell your health care provider about any new moles or changes in moles, especially if there is a change in a mole's shape or color.  Also tell your health care provider if you have a mole that is larger than the size of a pencil eraser.  Always use sunscreen. Apply sunscreen liberally and repeatedly throughout the day.  Protect yourself by wearing long sleeves, pants, a wide-brimmed hat, and sunglasses whenever you are outside. Heart disease, diabetes, and high blood pressure  High blood pressure causes heart disease and increases the risk of stroke. High blood pressure is more likely to develop in: ? People who have blood pressure in the high end of the normal range (130-139/85-89 mm Hg). ? People who are overweight or obese. ? People who are African American.  If you are 8-39 years of age, have your blood pressure checked every 3-5 years. If you are 68 years of age or older, have your blood pressure checked every year. You should have your blood pressure measured twice-once when you are at a hospital or clinic, and once when you are not at a hospital or clinic. Record the average of the two measurements. To check your blood pressure when you are not at a hospital or clinic, you can use: ? An automated blood pressure machine at a pharmacy. ? A home blood pressure monitor.  If you are between 53 years and 52 years old, ask your health care provider if you should take aspirin to prevent strokes.  Have regular diabetes screenings. This involves taking a blood sample to check your fasting blood sugar level. ? If you are at a normal weight and have a low risk for diabetes, have this test once every three years after 46 years  of age. ? If you are overweight and have a high risk for diabetes, consider being tested at a younger age or more often. Preventing infection Hepatitis B  If you have a higher risk for hepatitis B, you should be screened for this virus. You are considered at high risk for hepatitis B if: ? You were born in a country where hepatitis B is common. Ask your health care provider which countries are considered high risk. ? Your parents were born in a high-risk country, and you have not been immunized against hepatitis B (hepatitis B vaccine). ? You have HIV or AIDS. ? You use needles to inject street drugs. ? You live with someone who has hepatitis B. ? You have had sex with someone who has hepatitis B. ? You get hemodialysis treatment. ? You take certain medicines for conditions, including cancer, organ transplantation, and autoimmune conditions. Hepatitis C  Blood testing is recommended for: ? Everyone born  from [place] through 1963. ? Anyone with known risk factors for hepatitis C. Sexually transmitted infections (STIs)  You should be screened for sexually transmitted infections (STIs) including gonorrhea and chlamydia if: ? You are sexually active and are younger than 46 years of age. ? You are older than 46 years of age and your health care provider tells you that you are at risk for this type of infection. ? Your sexual activity has changed since you were last screened and you are at an increased risk for chlamydia or gonorrhea. Ask your health care provider if you are at risk.  If you do not have HIV, but are at risk, it may be recommended that you take a prescription medicine daily to prevent HIV infection. This is called pre-exposure prophylaxis (PrEP). You are considered at risk if: ? You are sexually active and do not regularly use condoms or know the HIV status of your partner(s). ? You take drugs by injection. ? You are sexually active with a partner who has HIV. Talk with your  health care provider about whether you are at high risk of being infected with HIV. If you choose to begin PrEP, you should first be tested for HIV. You should then be tested every 3 months for as long as you are taking PrEP. Pregnancy  If you are premenopausal and you may become pregnant, ask your health care provider about preconception counseling.  If you may become pregnant, take 400 to 800 micrograms (mcg) of folic acid every day.  If you want to prevent pregnancy, talk to your health care provider about birth control (contraception). Osteoporosis and menopause  Osteoporosis is a disease in which the bones lose minerals and strength with aging. This can result in serious bone fractures. Your risk for osteoporosis can be identified using a bone density scan.  If you are 69 years of age or older, or if you are at risk for osteoporosis and fractures, ask your health care provider if you should be screened.  Ask your health care provider whether you should take a calcium or vitamin D supplement to lower your risk for osteoporosis.  Menopause may have certain physical symptoms and risks.  Hormone replacement therapy may reduce some of these symptoms and risks. Talk to your health care provider about whether hormone replacement therapy is right for you. Follow these instructions at home:  Schedule regular health, dental, and eye exams.  Stay current with your immunizations.  Do not use any tobacco products including cigarettes, chewing tobacco, or electronic cigarettes.  If you are pregnant, do not drink alcohol.  If you are breastfeeding, limit how much and how often you drink alcohol.  Limit alcohol intake to no more than 1 drink per day for nonpregnant women. One drink equals 12 ounces of beer, 5 ounces of wine, or 1 ounces of hard liquor.  Do not use street drugs.  Do not share needles.  Ask your health care provider for help if you need support or information about quitting  drugs.  Tell your health care provider if you often feel depressed.  Tell your health care provider if you have ever been abused or do not feel safe at home. This information is not intended to replace advice given to you by your health care provider. Make sure you discuss any questions you have with your health care provider. Document Released: 01/30/2011 Document Revised: 12/23/2015 Document Reviewed: 04/20/2015 Elsevier Interactive Patient Education  2019 [employer].  Shekira Drummer, MD [facility] 

## 2018-09-27 NOTE — Patient Instructions (Addendum)
If you have lab work done today you will be contacted with your lab results within the next 2 weeks.  If you have not heard from [place] then please contact us. The fastest way to get your results is to register for My Chart.   IF you received an x-ray today, you will receive an invoice from [facility]. Please contact [facility] at [phone] with questions or concerns regarding your invoice.   IF you received labwork today, you will receive an invoice from [place]. Please contact [facility] at [phone] with questions or concerns regarding your invoice.   Our billing staff will not be able to assist you with questions regarding bills from these companies.  You will be contacted with the lab results as soon as they are available. The fastest way to get your results is to activate your My Chart account. Instructions are located on the last page of this paperwork. If you have not heard from [place] regarding the results in 2 weeks, please contact this office.    Health Maintenance, Female Adopting a healthy lifestyle and getting preventive care can go a long way to promote health and wellness. Talk with your health care provider about what schedule of regular examinations is right for you. This is a good chance for you to check in with your provider about disease prevention and staying healthy. In between checkups, there are plenty of things you can do on your own. Experts have done a lot of research about which lifestyle changes and preventive measures are most likely to keep you healthy. Ask your health care provider for more information. Weight and diet Eat a healthy diet  Be sure to include plenty of vegetables, fruits, low-fat dairy products, and lean protein.  Do not eat a lot of foods high in solid fats, added sugars, or salt.  Get regular exercise. This is one of the most important things you can do for your health. ? Most adults should exercise for at least 150  minutes each week. The exercise should increase your heart rate and make you sweat (moderate-intensity exercise). ? Most adults should also do strengthening exercises at least twice a week. This is in addition to the moderate-intensity exercise. Maintain a healthy weight  Body mass index (BMI) is a measurement that can be used to identify possible weight problems. It estimates body fat based on height and weight. Your health care provider can help determine your BMI and help you achieve or maintain a healthy weight.  For females 9 years of age and older: ? A BMI below 18.5 is considered underweight. ? A BMI of 18.5 to 24.9 is normal. ? A BMI of 25 to 29.9 is considered overweight. ? A BMI of 30 and above is considered obese. Watch levels of cholesterol and blood lipids  You should start having your blood tested for lipids and cholesterol at 46 years of age, then have this test every 5 years.  You may need to have your cholesterol levels checked more often if: ? Your lipid or cholesterol levels are high. ? You are older than 46 years of age. ? You are at high risk for heart disease. Cancer screening Lung Cancer  Lung cancer screening is recommended for adults 39-53 years old who are at high risk for lung cancer because of a history of smoking.  A yearly low-dose CT scan of the lungs is recommended for people who: ? Currently smoke. ? Have quit within the past 15  years. ? Have at least a 30-pack-year history of smoking. A pack year is smoking an average of one pack of cigarettes a day for 1 year.  Yearly screening should continue until it has been 15 years since you quit.  Yearly screening should stop if you develop a health problem that would prevent you from having lung cancer treatment. Breast Cancer  Practice breast self-awareness. This means understanding how your breasts normally appear and feel.  It also means doing regular breast self-exams. Let your health care provider  know about any changes, no matter how small.  If you are in your 20s or 30s, you should have a clinical breast exam (CBE) by a health care provider every 1-3 years as part of a regular health exam.  If you are 3 or older, have a CBE every year. Also consider having a breast X-ray (mammogram) every year.  If you have a family history of breast cancer, talk to your health care provider about genetic screening.  If you are at high risk for breast cancer, talk to your health care provider about having an MRI and a mammogram every year.  Breast cancer gene (BRCA) assessment is recommended for women who have family members with BRCA-related cancers. BRCA-related cancers include: ? Breast. ? Ovarian. ? Tubal. ? Peritoneal cancers.  Results of the assessment will determine the need for genetic counseling and BRCA1 and BRCA2 testing. Cervical Cancer Your health care provider may recommend that you be screened regularly for cancer of the pelvic organs (ovaries, uterus, and vagina). This screening involves a pelvic examination, including checking for microscopic changes to the surface of your cervix (Pap test). You may be encouraged to have this screening done every 3 years, beginning at age 63.  For women ages 71-65, health care providers may recommend pelvic exams and Pap testing every 3 years, or they may recommend the Pap and pelvic exam, combined with testing for human papilloma virus (HPV), every 5 years. Some types of HPV increase your risk of cervical cancer. Testing for HPV may also be done on women of any age with unclear Pap test results.  Other health care providers may not recommend any screening for nonpregnant women who are considered low risk for pelvic cancer and who do not have symptoms. Ask your health care provider if a screening pelvic exam is right for you.  If you have had past treatment for cervical cancer or a condition that could lead to cancer, you need Pap tests and  screening for cancer for at least 20 years after your treatment. If Pap tests have been discontinued, your risk factors (such as having a new sexual partner) need to be reassessed to determine if screening should resume. Some women have medical problems that increase the chance of getting cervical cancer. In these cases, your health care provider may recommend more frequent screening and Pap tests. Colorectal Cancer  This type of cancer can be detected and often prevented.  Routine colorectal cancer screening usually begins at 46 years of age and continues through 46 years of age.  Your health care provider may recommend screening at an earlier age if you have risk factors for colon cancer.  Your health care provider may also recommend using home test kits to check for hidden blood in the stool.  A small camera at the end of a tube can be used to examine your colon directly (sigmoidoscopy or colonoscopy). This is done to check for the earliest forms of colorectal cancer.  Routine screening usually begins at age 56.  Direct examination of the colon should be repeated every 5-10 years through 46 years of age. However, you may need to be screened more often if early forms of precancerous polyps or small growths are found. Skin Cancer  Check your skin from head to toe regularly.  Tell your health care provider about any new moles or changes in moles, especially if there is a change in a mole's shape or color.  Also tell your health care provider if you have a mole that is larger than the size of a pencil eraser.  Always use sunscreen. Apply sunscreen liberally and repeatedly throughout the day.  Protect yourself by wearing long sleeves, pants, a wide-brimmed hat, and sunglasses whenever you are outside. Heart disease, diabetes, and high blood pressure  High blood pressure causes heart disease and increases the risk of stroke. High blood pressure is more likely to develop in: ? People who  have blood pressure in the high end of the normal range (130-139/85-89 mm Hg). ? People who are overweight or obese. ? People who are African American.  If you are 88-12 years of age, have your blood pressure checked every 3-5 years. If you are 69 years of age or older, have your blood pressure checked every year. You should have your blood pressure measured twice-once when you are at a hospital or clinic, and once when you are not at a hospital or clinic. Record the average of the two measurements. To check your blood pressure when you are not at a hospital or clinic, you can use: ? An automated blood pressure machine at a pharmacy. ? A home blood pressure monitor.  If you are between 40 years and 102 years old, ask your health care provider if you should take aspirin to prevent strokes.  Have regular diabetes screenings. This involves taking a blood sample to check your fasting blood sugar level. ? If you are at a normal weight and have a low risk for diabetes, have this test once every three years after 46 years of age. ? If you are overweight and have a high risk for diabetes, consider being tested at a younger age or more often. Preventing infection Hepatitis B  If you have a higher risk for hepatitis B, you should be screened for this virus. You are considered at high risk for hepatitis B if: ? You were born in a country where hepatitis B is common. Ask your health care provider which countries are considered high risk. ? Your parents were born in a high-risk country, and you have not been immunized against hepatitis B (hepatitis B vaccine). ? You have HIV or AIDS. ? You use needles to inject street drugs. ? You live with someone who has hepatitis B. ? You have had sex with someone who has hepatitis B. ? You get hemodialysis treatment. ? You take certain medicines for conditions, including cancer, organ transplantation, and autoimmune conditions. Hepatitis C  Blood testing is  recommended for: ? Everyone born from [birth year] through 1963. ? Anyone with known risk factors for hepatitis C. Sexually transmitted infections (STIs)  You should be screened for sexually transmitted infections (STIs) including gonorrhea and chlamydia if: ? You are sexually active and are younger than 46 years of age. ? You are older than 46 years of age and your health care provider tells you that you are at risk for this type of infection. ? Your sexual activity has changed since you  were last screened and you are at an increased risk for chlamydia or gonorrhea. Ask your health care provider if you are at risk.  If you do not have HIV, but are at risk, it may be recommended that you take a prescription medicine daily to prevent HIV infection. This is called pre-exposure prophylaxis (PrEP). You are considered at risk if: ? You are sexually active and do not regularly use condoms or know the HIV status of your partner(s). ? You take drugs by injection. ? You are sexually active with a partner who has HIV. Talk with your health care provider about whether you are at high risk of being infected with HIV. If you choose to begin PrEP, you should first be tested for HIV. You should then be tested every 3 months for as long as you are taking PrEP. Pregnancy  If you are premenopausal and you may become pregnant, ask your health care provider about preconception counseling.  If you may become pregnant, take 400 to 800 micrograms (mcg) of folic acid every day.  If you want to prevent pregnancy, talk to your health care provider about birth control (contraception). Osteoporosis and menopause  Osteoporosis is a disease in which the bones lose minerals and strength with aging. This can result in serious bone fractures. Your risk for osteoporosis can be identified using a bone density scan.  If you are 65 years of age or older, or if you are at risk for osteoporosis and fractures, ask your health care  provider if you should be screened.  Ask your health care provider whether you should take a calcium or vitamin D supplement to lower your risk for osteoporosis.  Menopause may have certain physical symptoms and risks.  Hormone replacement therapy may reduce some of these symptoms and risks. Talk to your health care provider about whether hormone replacement therapy is right for you. Follow these instructions at home:  Schedule regular health, dental, and eye exams.  Stay current with your immunizations.  Do not use any tobacco products including cigarettes, chewing tobacco, or electronic cigarettes.  If you are pregnant, do not drink alcohol.  If you are breastfeeding, limit how much and how often you drink alcohol.  Limit alcohol intake to no more than 1 drink per day for nonpregnant women. One drink equals 12 ounces of beer, 5 ounces of wine, or 1 ounces of hard liquor.  Do not use street drugs.  Do not share needles.  Ask your health care provider for help if you need support or information about quitting drugs.  Tell your health care provider if you often feel depressed.  Tell your health care provider if you have ever been abused or do not feel safe at home. This information is not intended to replace advice given to you by your health care provider. Make sure you discuss any questions you have with your health care provider. Document Released: 01/30/2011 Document Revised: 12/23/2015 Document Reviewed: 04/20/2015 Elsevier Interactive Patient Education  2019 [employer]vier Inc.  

## 2018-09-29 NOTE — Addendum Note (Signed)
Addended by: Myles Lipps on: 09/29/2018 07:19 PM   Modules accepted: Orders

## 2018-10-01 ENCOUNTER — Telehealth: Payer: Self-pay | Admitting: Family Medicine

## 2018-10-01 ENCOUNTER — Ambulatory Visit (INDEPENDENT_AMBULATORY_CARE_PROVIDER_SITE_OTHER): Payer: Managed Care, Other (non HMO) | Admitting: Family Medicine

## 2018-10-01 DIAGNOSIS — R7989 Other specified abnormal findings of blood chemistry: Secondary | ICD-10-CM

## 2018-10-01 DIAGNOSIS — R7309 Other abnormal glucose: Secondary | ICD-10-CM

## 2018-10-01 NOTE — Progress Notes (Signed)
Lab only visit 

## 2018-10-01 NOTE — Telephone Encounter (Signed)
Informed pt of labs, she verbalized understanding.  

## 2018-10-01 NOTE — Telephone Encounter (Signed)
Pt was advised of labs, she verbalized understanding.

## 2018-10-01 NOTE — Telephone Encounter (Signed)
Called pt and LVM to call office back about labs.  

## 2018-10-01 NOTE — Telephone Encounter (Signed)
Copied from CRM [id number]. Topic: General - Other >> Oct 01, 2018  1:58 PM Jaquita Rector A wrote: Reason for CRM: Patient called to say that [place] called her regarding lab work she is asking for a call back at Ph# [phone]

## 2018-10-07 LAB — HEMOGLOBIN A1C
Est. average glucose Bld gHb Est-mCnc: 249 mg/dL
Hgb A1c MFr Bld: 10.3 % — ABNORMAL HIGH (ref 4.8–5.6)

## 2018-10-07 LAB — HEMOCHROMATOSIS DNA-PCR(C282Y,H63D)

## 2018-10-16 ENCOUNTER — Encounter: Payer: Self-pay | Admitting: Family Medicine

## 2018-10-16 ENCOUNTER — Other Ambulatory Visit: Payer: Self-pay

## 2018-10-16 ENCOUNTER — Ambulatory Visit (INDEPENDENT_AMBULATORY_CARE_PROVIDER_SITE_OTHER): Payer: Managed Care, Other (non HMO) | Admitting: Family Medicine

## 2018-10-16 VITALS — BP 112/82 | HR 100 | Temp 98.2°F | Ht 64.17 in | Wt 155.0 lb

## 2018-10-16 DIAGNOSIS — E1165 Type 2 diabetes mellitus with hyperglycemia: Secondary | ICD-10-CM | POA: Diagnosis not present

## 2018-10-16 LAB — POCT URINALYSIS DIP (MANUAL ENTRY)
Glucose, UA: 500 mg/dL — AB
Leukocytes, UA: NEGATIVE
Nitrite, UA: NEGATIVE
Protein Ur, POC: 100 mg/dL — AB
Spec Grav, UA: 1.025 (ref 1.010–1.025)
Urobilinogen, UA: 0.2 E.U./dL
pH, UA: 6 (ref 5.0–8.0)

## 2018-10-16 MED ORDER — GLIPIZIDE-METFORMIN HCL 5-500 MG PO TABS: 1.0000 | ORAL_TABLET | Freq: Two times a day (BID) | ORAL | 2 refills | Status: DC

## 2018-10-16 MED ORDER — BLOOD GLUCOSE METER KIT: PACK | 5 refills | Status: DC

## 2018-10-16 NOTE — Patient Instructions (Signed)
Diabetes Mellitus and Standards of Medical Care  Managing diabetes (diabetes mellitus) can be complicated. Your diabetes treatment may be managed by a team of health care providers, including:  A physician who specializes in diabetes (endocrinologist).  A nurse practitioner or physician assistant.  Nurses.  A diet and nutrition specialist (registered dietitian).  A certified diabetes educator (CDE).  An exercise specialist.  A pharmacist.  An eye doctor.  A foot specialist (podiatrist).  A dentist.  A primary care provider.  A mental health provider.  Your health care providers follow guidelines to help you get the best quality of care. The following schedule is a general guideline for your diabetes management plan. Your health care providers may give you more specific instructions.  Physical exams  Upon being diagnosed with diabetes mellitus, and each year after that, your health care provider will ask about your medical and family history. He or she will also do a physical exam. Your exam may include:  Measuring your height, weight, and body mass index (BMI).  Checking your blood pressure. This will be done at every routine medical visit. Your target blood pressure may vary depending on your medical conditions, your age, and other factors.  Thyroid gland exam.  Skin exam.  Screening for damage to your nerves (peripheral neuropathy). This may include checking the pulse in your legs and feet and checking the level of sensation in your hands and feet.  A complete foot exam to inspect the structure and skin of your feet, including checking for cuts, bruises, redness, blisters, sores, or other problems.  Screening for blood vessel (vascular) problems, which may include checking the pulse in your legs and feet and checking your temperature.  Blood tests  Depending on your treatment plan and your personal needs, you may have the following tests done:  HbA1c (hemoglobin A1c). This test provides information about blood  sugar (glucose) control over the previous 2-3 months. It is used to adjust your treatment plan, if needed. This test will be done:  At least 2 times a year, if you are meeting your treatment goals.  4 times a year, if you are not meeting your treatment goals or if treatment goals have changed.  Lipid testing, including total, LDL, and HDL cholesterol and triglyceride levels.  The goal for LDL is less than 100 mg/dL (5.5 mmol/L). If you are at high risk for complications, the goal is less than 70 mg/dL (3.9 mmol/L).  The goal for HDL is 40 mg/dL (2.2 mmol/L) or higher for men and 50 mg/dL (2.8 mmol/L) or higher for women. An HDL cholesterol of 60 mg/dL (3.3 mmol/L) or higher gives some protection against heart disease.  The goal for triglycerides is less than 150 mg/dL (8.3 mmol/L).  Liver function tests.  Kidney function tests.  Thyroid function tests.  Dental and eye exams  Visit your dentist two times a year.  If you have type 1 diabetes, your health care provider may recommend an eye exam 3-5 years after you are diagnosed, and then once a year after your first exam.  For children with type 1 diabetes, a health care provider may recommend an eye exam when your child is age 10 or older and has had diabetes for 3-5 years. After the first exam, your child should get an eye exam once a year.  If you have type 2 diabetes, your health care provider may recommend an eye exam as soon as you are diagnosed, and then once a year after   your first exam.  Immunizations    The yearly flu (influenza) vaccine is recommended for everyone 6 months or older who has diabetes.  The pneumonia (pneumococcal) vaccine is recommended for everyone 2 years or older who has diabetes. If you are 65 or older, you may get the pneumonia vaccine as a series of two separate shots.  The hepatitis B vaccine is recommended for adults shortly after being diagnosed with diabetes.  Adults and children with diabetes should receive all other vaccines  according to age-specific recommendations from the Centers for Disease Control and Prevention (CDC).  Mental and emotional health  Screening for symptoms of eating disorders, anxiety, and depression is recommended at the time of diagnosis and afterward as needed. If your screening shows that you have symptoms (positive screening result), you may need more evaluation and you may work with a mental health care provider.  Treatment plan  Your treatment plan will be reviewed at every medical visit. You and your health care provider will discuss:  How you are taking your medicines, including insulin.  Any side effects you are experiencing.  Your blood glucose target goals.  The frequency of your blood glucose monitoring.  Lifestyle habits, such as activity level as well as tobacco, alcohol, and substance use.  Diabetes self-management education  Your health care provider will assess how well you are monitoring your blood glucose levels and whether you are taking your insulin correctly. He or she may refer you to:  A certified diabetes educator to manage your diabetes throughout your life, starting at diagnosis.  A registered dietitian who can create or review your personal nutrition plan.  An exercise specialist who can discuss your activity level and exercise plan.  Summary  Managing diabetes (diabetes mellitus) can be complicated. Your diabetes treatment may be managed by a team of health care providers.  Your health care providers follow guidelines in order to help you get the best quality of care.  Standards of care including having regular physical exams, blood tests, blood pressure monitoring, immunizations, screening tests, and education about how to manage your diabetes.  Your health care providers may also give you more specific instructions based on your individual health.  This information is not intended to replace advice given to you by your health care provider. Make sure you discuss any questions you have  with your health care provider.  Document Released: 05/14/2009 Document Revised: 04/05/2018 Document Reviewed: 04/14/2016  Elsevier Interactive Patient Education  2019 [employer] Inc[employer]

## 2018-10-16 NOTE — Progress Notes (Signed)
 3/18/202011:36 AM  Sandra Moody 02/08/1971, 47 y.o. female [id number]  Chief Complaint  Patient presents with  . Follow-up    lab results    HPI:   Patient is a 47 y.o. female with past medical history significant for HTN, migraines, asthma, GERD, biliary dyskinesia, who presents today for followup on labs for joint pain and muscle cramps, found incidental hyperglycemia and elevated ferritin  Glucose 276 a1c 10.3 crt 1.10, normal LFTs Neg Hematochromatosis Denies any h/o gestational diabetes Dx with pre-diabetes 3 years ago Strong fhx of diabetes  Fall Risk  10/16/2018 09/27/2018 09/17/2018 03/16/2017 01/25/2017  Falls in the past year? 0 0 0 No No  Number falls in past yr: 0 0 0 - -  Injury with Fall? 0 0 0 - -  Follow up - Falls evaluation completed Falls evaluation completed - -     Depression screen PHQ 2/9 10/16/2018 09/27/2018 09/17/2018  Decreased Interest 0 0 0  Down, Depressed, Hopeless 0 0 0  PHQ - 2 Score 0 0 0    Allergies  Allergen Reactions  . Ginger Itching and Swelling  . Passion Fruit Flavor [Flavoring Agent] Itching and Swelling  . Peanuts [Peanut Oil] Anaphylaxis    PT ALLERGIC TO ALL TREE NUTS  . Shellfish Allergy Anaphylaxis  . Tamiflu [Oseltamivir Phosphate]     Prior to Admission medications   Medication Sig Start Date End Date Taking? Authorizing Provider  ALBUTEROL SULFATE PO Take by mouth.   Yes [provider]  amLODipine (NORVASC) 2.5 MG tablet Take 2.5 mg by mouth daily. 08/16/17  Yes [provider]  baclofen (LIORESAL) 10 MG tablet Take 10 mg by mouth 3 (three) times daily.   Yes [provider]  chlorproMAZINE (THORAZINE) 25 MG tablet Take 25 mg by mouth 3 (three) times daily.   Yes [provider]  EPIPEN 2-PAK 0.3 MG/0.3ML SOAJ injection Inject 0.3 mg into the muscle as needed (for allergic reaction).  05/18/16  Yes [provider]  imipramine (TOFRANIL-PM) 100 MG capsule Take 100 mg by  mouth at bedtime.   Yes [provider]  medroxyPROGESTERone (DEPO-PROVERA) 150 MG/ML injection  08/15/18  Yes [provider]  montelukast (SINGULAIR) 10 MG tablet Take 10 mg by mouth at bedtime.   Yes [provider]  Respiratory Therapy Supplies (NEBULIZER/TUBING/MOUTHPIECE) KIT Use as directed with nebulizer and medication. 01/18/18  Yes Ambs, Anne M, FNP  rizatriptan (MAXALT) 10 MG tablet Take 1 tablet (10 mg total) by mouth 3 (three) times daily as needed for migraine. 08/29/18  Yes Willis, Charles K, MD  SYMBICORT 160-4.5 MCG/ACT inhaler INHALE 2 PUFFS INTO THE LUNGS DAILY. 05/09/18  Yes Gallagher, Joel Louis, MD  imipramine (TOFRANIL) 50 MG tablet Take 100 mg by mouth daily. 09/21/18   [provider]    Past Medical History:  Diagnosis Date  . Acute renal disease   . Asthma   . Common migraine with intractable migraine 08/29/2018  . Daily headache    (03/08/2017)  . GERD (gastroesophageal reflux disease)   . H pylori ulcer ~ 08/2016  . History of hiatal hernia   . Leaky heart valve ~ 09/2016  . Migraine    "maybe weekly" (03/08/2017)  . PONV (postoperative nausea and vomiting)     Past Surgical History:  Procedure Laterality Date  . CHOLECYSTECTOMY N/A 03/08/2017   Procedure: LAPAROSCOPIC CHOLECYSTECTOMY;  Surgeon: Blackman, Douglas, MD;  Location: [facility];  Service: General;  Laterality: N/A;  .   DILATION AND CURETTAGE OF UTERUS    . LAPAROSCOPIC CHOLECYSTECTOMY  03/08/2017  . TUBAL LIGATION      Social History   Tobacco Use  . Smoking status: Never Smoker  . Smokeless tobacco: Never Used  Substance Use Topics  . Alcohol use: Yes    Comment: 03/08/2017 "might have a drink on a holiday"    Family History  Problem Relation Age of Onset  . Thyroid disease Mother   . Diabetes Maternal Grandmother   . Heart disease Maternal Grandfather     ROS Neg abd pain, nausea, vomiting Pos increase thirst Neg SOB  OBJECTIVE:  Blood pressure 112/82,  pulse 100, temperature 98.2 F (36.8 C), temperature source Oral, height 5' 4.17" (1.63 m), weight 155 lb (70.3 kg), SpO2 98 %. Body mass index is 26.46 kg/m.   Physical Exam Vitals signs and nursing note reviewed.  Constitutional:      Appearance: She is well-developed.  HENT:     Head: Normocephalic and atraumatic.  Eyes:     General: No scleral icterus.    Conjunctiva/sclera: Conjunctivae normal.     Pupils: Pupils are equal, round, and reactive to light.  Neck:     Musculoskeletal: Neck supple.  Pulmonary:     Effort: Pulmonary effort is normal.  Skin:    General: Skin is warm and dry.  Neurological:     Mental Status: She is alert and oriented to person, place, and time.     Results for orders placed or performed in visit on 10/16/18 (from the past 24 hour(s))  POCT urinalysis dipstick     Status: Abnormal   Collection Time: 10/16/18 12:04 PM  Result Value Ref Range   Color, UA orange (A) yellow   Clarity, UA clear clear   Glucose, UA =500 (A) negative mg/dL   Bilirubin, UA small (A) negative   Ketones, POC UA trace (5) (A) negative mg/dL   Spec Grav, UA 1.025 1.010 - 1.025   Blood, UA moderate (A) negative   pH, UA 6.0 5.0 - 8.0   Protein Ur, POC =100 (A) negative mg/dL   Urobilinogen, UA 0.2 0.2 or 1.0 E.U./dL   Nitrite, UA Negative Negative   Leukocytes, UA Negative Negative    ASSESSMENT and PLAN  1. Type 2 diabetes mellitus with hyperglycemia, without long-term current use of insulin (HCC) New diagnosis. More than 50% of this 25 minute visit was spent on counseling and coordination of care. Discussed diet and carb counting in depth. Discussed new meds r/se/b. Discussed glucose monitoring. Discussed RTC precautions. Patient educational handouts given.  - POCT urinalysis dipstick - Amb Referral to Nutrition and Diabetic E  Other orders - glipiZIDE-metformin (METAGLIP) 5-500 MG tablet; Take 1 tablet by mouth 2 (two) times daily before a meal. - blood glucose  meter kit and supplies; Per insurance preference. Use to check glucose twice a day (fasting and 2 hours after dinner)  Dx E11.65  No follow-ups on file.    Irma M Santiago, MD [facility] [address] [place], [place] [address] Ph.  [phone] Fax [phone]   

## 2018-10-29 ENCOUNTER — Telehealth: Payer: Self-pay | Admitting: Family Medicine

## 2018-10-29 ENCOUNTER — Emergency Department (HOSPITAL_COMMUNITY)
Admission: EM | Admit: 2018-10-29 | Discharge: 2018-10-30 | Disposition: A | Discharge status: Home / Self Care | Payer: Managed Care, Other (non HMO) | Attending: Emergency Medicine | Admitting: Emergency Medicine

## 2018-10-29 ENCOUNTER — Other Ambulatory Visit: Payer: Self-pay

## 2018-10-29 ENCOUNTER — Encounter (HOSPITAL_COMMUNITY): Payer: Self-pay

## 2018-10-29 ENCOUNTER — Ambulatory Visit: Payer: Self-pay | Admitting: *Deleted

## 2018-10-29 DIAGNOSIS — Z91010 Allergy to peanuts: Secondary | ICD-10-CM | POA: Insufficient documentation

## 2018-10-29 DIAGNOSIS — J45909 Unspecified asthma, uncomplicated: Secondary | ICD-10-CM | POA: Insufficient documentation

## 2018-10-29 DIAGNOSIS — R251 Tremor, unspecified: Secondary | ICD-10-CM | POA: Insufficient documentation

## 2018-10-29 DIAGNOSIS — Z79899 Other long term (current) drug therapy: Secondary | ICD-10-CM | POA: Diagnosis not present

## 2018-10-29 LAB — CBC
HCT: 37.6 % (ref 36.0–46.0)
Hemoglobin: 11.8 g/dL — ABNORMAL LOW (ref 12.0–15.0)
MCH: 30.2 pg (ref 26.0–34.0)
MCHC: 31.4 g/dL (ref 30.0–36.0)
MCV: 96.2 fL (ref 80.0–100.0)
Platelets: 320 10*3/uL (ref 150–400)
RBC: 3.91 MIL/uL (ref 3.87–5.11)
RDW: 12.8 % (ref 11.5–15.5)
WBC: 7.7 10*3/uL (ref 4.0–10.5)
nRBC: 0 % (ref 0.0–0.2)

## 2018-10-29 LAB — CBG MONITORING, ED: Glucose-Capillary: 144 mg/dL — ABNORMAL HIGH (ref 70–99)

## 2018-10-29 LAB — I-STAT BETA HCG BLOOD, ED (MC, WL, AP ONLY): I-stat hCG, quantitative: <5 m[IU]/mL (ref <5)

## 2018-10-29 NOTE — Telephone Encounter (Signed)
Pt returned call to office and states that blood glucose kit was not received by [facility] [address]. Pt states she did speak with someone in the pharmacy on today to confirm that prescription sent on 10/16/18 was not received.

## 2018-10-29 NOTE — ED Triage Notes (Signed)
Pt recently diagnosed with diabetes and states she called a few times to the Pharmacy to get a diabetes testing kit.  The kit was not received.  Came to the ED due to still having the shaking and feeling as nauseated at times.  Recent CBG at home was 101.

## 2018-10-29 NOTE — Telephone Encounter (Signed)
Pt calling stating that she checked her blood glucose about 20-30 min before calling the office and it was noted to be 73. Pt states she did check her blood sugar before having something to eat and states the last time she did eat today was around 2 pm. Pt also states she took her dose of diabetic medication approximately 10 min before calling the office. Pt states she is  is newly diagnosed DM. Pt states she has some nausea and feels like her head is going to explode and does not have much of an appetite. Explained to pt that it is very important to eat while taking these medications to prevent blood glucose from dropping too low. Advised pt to drink a cup of orange juice and triage nurse would call her back to obtain a recheck of blood glucose. Pt advised that if she begins to fel worse to return call  Pt verbalized understanding.   Reason for Disposition . [1] Low blood sugar symptoms with no other adult present AND [2] hasn't tried Care Advice  Answer Assessment - Initial Assessment Questions 1. SYMPTOMS: "What symptoms are you concerned about?"     Low blood sugar, headache, and nausea 2. ONSET:  "When did the symptoms start?"     About 20 min before 3. BLOOD GLUCOSE: "What is your blood glucose level?"      73 4. USUAL RANGE: "What is your blood glucose level usually?" (e.g., usual fasting morning value, usual evening value)     5. TYPE 1 or 2:  "Do you know what type of diabetes you have?"  (e.g., Type 1, Type 2, Gestational; doesn't know)       6. INSULIN: "Do you take insulin?" "What type of insulin(s) do you use? What is the mode of delivery? (syringe, pen (e.g., injection or  pump)      No 7. DIABETES PILLS: "Do you take any pills for your diabetes?"     Yes, glipizide and metformin 8. OTHER SYMPTOMS: "Do you have any symptoms?" (e.g., fever, frequent urination, difficulty breathing, vomiting)    Stomach and headache feels 9. LOW BLOOD GLUCOSE TREATMENT: "What have you done so far to  treat the low blood glucose level?"     73 10. FOOD: "When did you last eat or drink?"       About 2 pm 11. ALONE: "Are you alone right now or is someone with you?"        Someone is in the home with the pt at this time  Protocols used: DIABETES - LOW BLOOD SUGAR-A-AH

## 2018-10-29 NOTE — Telephone Encounter (Signed)
Returned call to pt who states that her blood glucose is 99. Explained to pt that she needs to make sure she eats tonight to prevent blood sugar from dropping lower after taking medication. Pt given home care advice and advised if home care measures do not work to increase blood glucose to seek treatment in the ED.Pt verbalized understanding. Pt also states that [facility] [address] around 1645 today and the pt states that the blood glucose kit was not received by the pharmacy. Also see telephone encounter for 10/29/18.

## 2018-10-29 NOTE — Telephone Encounter (Signed)
Copied from [place] [id number]. Topic: Quick Communication - See Telephone Encounter >> Oct 29, 2018 12:50 PM Selinda Flavin B, NT wrote: CRM for notification. See Telephone encounter for: 10/29/18. Patient calling and states that she was recently diagnosed with diabetes and is wanting to know where she needs to get her glucose mete kit? Would like to know if that is something that Dr Pamella Pert could send to her pharmacy? Please advise. [id number] [place] [place] [facility] [address]

## 2018-10-29 NOTE — Telephone Encounter (Signed)
Called and informed pt that a kit was sent to her pharmacy. LVM for pt to call office back if she does not received the kit or need further assidents.

## 2018-10-30 ENCOUNTER — Ambulatory Visit: Payer: Self-pay | Admitting: Family Medicine

## 2018-10-30 ENCOUNTER — Other Ambulatory Visit: Payer: Self-pay

## 2018-10-30 DIAGNOSIS — E1165 Type 2 diabetes mellitus with hyperglycemia: Secondary | ICD-10-CM

## 2018-10-30 LAB — BASIC METABOLIC PANEL
Anion gap: 6 (ref 5–15)
BUN: 7 mg/dL (ref 6–20)
CO2: 24 mmol/L (ref 22–32)
Calcium: 8.8 mg/dL — ABNORMAL LOW (ref 8.9–10.3)
Chloride: 106 mmol/L (ref 98–111)
Creatinine, Ser: 1.03 mg/dL — ABNORMAL HIGH (ref 0.44–1.00)
GFR calc Af Amer: >60 mL/min (ref >60)
GFR calc non Af Amer: >60 mL/min (ref >60)
Glucose, Bld: 139 mg/dL — ABNORMAL HIGH (ref 70–99)
Potassium: 3.3 mmol/L — ABNORMAL LOW (ref 3.5–5.1)
Sodium: 136 mmol/L (ref 135–145)

## 2018-10-30 MED ORDER — BLOOD GLUCOSE METER KIT: PACK | 5 refills | Status: DC

## 2018-10-30 NOTE — ED Notes (Signed)
Patient verbalizes understanding of discharge instructions. Opportunity for questioning and answers were provided. Armband removed by staff, pt discharged from ED ambulatory.   

## 2018-10-30 NOTE — Discharge Instructions (Signed)
Continue to monitor your blood sugar.  Please follow-up with your doctor.  Continue to take your medications as prescribed.

## 2018-10-30 NOTE — ED Provider Notes (Signed)
[facility] EMERGENCY DEPARTMENT Provider Note   CSN: [id number] Arrival date & time: 10/29/18  2318    History   Chief Complaint Chief Complaint  Patient presents with  . Diabetes  . Shaking    HPI Sandra Moody is a 48 y.o. female.     Patient presents to the emergency department with a chief complaint of headache, nausea, and shaking.  She states that she is newly diagnosed with type 2 diabetes.  She takes glipizide and metformin.  She has been on it for the past 2 weeks.  She states that today she felt shaky and nauseated and took her blood sugar and it was in the 70s.  She ate some candy, and it came up, but her symptoms did not improve.  She came to the emergency department for further evaluation.  She denies any known sick contacts.  Denies any fevers or chills.  Denies any cough, shortness of breath, vomiting, or diarrhea.  Denies any other associated symptoms.  There are no aggravating factors.  The history is provided by the patient. No language interpreter was used.    Past Medical History:  Diagnosis Date  . Acute renal disease   . Asthma   . Common migraine with intractable migraine 08/29/2018  . Daily headache    (03/08/2017)  . GERD (gastroesophageal reflux disease)   . H pylori ulcer ~ 08/2016  . History of hiatal hernia   . Leaky heart valve ~ 09/2016  . Migraine    "maybe weekly" (03/08/2017)  . PONV (postoperative nausea and vomiting)     Patient Active Problem List   Diagnosis Date Noted  . Common migraine with intractable migraine 08/29/2018  . Moderate persistent asthma without complication 14/43/1540  . Seasonal and perennial allergic rhinitis 06/28/2017  . Mild persistent asthma with acute exacerbation 06/28/2017  . Gastroesophageal reflux disease 06/28/2017  . Biliary dyskinesia 03/08/2017  . Asthma 03/02/2016  . Asthma with acute exacerbation 10/04/2015  . Other and unspecified ovarian cyst 06/17/2012    Past Surgical  History:  Procedure Laterality Date  . CHOLECYSTECTOMY N/A 03/08/2017   Procedure: LAPAROSCOPIC CHOLECYSTECTOMY;  Surgeon: Coralie Keens, MD;  Location: [facility];  Service: General;  Laterality: N/A;  . DILATION AND CURETTAGE OF UTERUS    . LAPAROSCOPIC CHOLECYSTECTOMY  03/08/2017  . TUBAL LIGATION       OB History    Gravida  5   Para  3   Term  3   Preterm  0   AB  2   Living        SAB  1   TAB  1   Ectopic  0   Multiple      Live Births               Home Medications    Prior to Admission medications   Medication Sig Start Date End Date Taking? Authorizing Provider  ALBUTEROL SULFATE PO Take by mouth.    [provider]  amLODipine (NORVASC) 2.5 MG tablet Take 2.5 mg by mouth daily. 08/16/17   [provider]  baclofen (LIORESAL) 10 MG tablet Take 10 mg by mouth 3 (three) times daily.    [provider]  blood glucose meter kit and supplies Per insurance preference. Use to check glucose twice a day (fasting and 2 hours after dinner)  Dx E11.65 10/16/18   Rutherford Guys, MD  chlorproMAZINE (THORAZINE) 25 MG tablet Take 25 mg by mouth  3 (three) times daily.    [provider]  EPIPEN 2-PAK 0.3 MG/0.3ML SOAJ injection Inject 0.3 mg into the muscle as needed (for allergic reaction).  05/18/16   [provider]  glipiZIDE-metformin (METAGLIP) 5-500 MG tablet Take 1 tablet by mouth 2 (two) times daily before a meal. 10/16/18   Rutherford Guys, MD  imipramine (TOFRANIL) 50 MG tablet Take 100 mg by mouth daily. 09/21/18   [provider]  imipramine (TOFRANIL-PM) 100 MG capsule Take 100 mg by mouth at bedtime.    [provider]  medroxyPROGESTERone (DEPO-PROVERA) 150 MG/ML injection  08/15/18   [provider]  montelukast (SINGULAIR) 10 MG tablet Take 10 mg by mouth at bedtime.    [provider]  Respiratory Therapy Supplies (NEBULIZER/TUBING/MOUTHPIECE) KIT Use as directed with  nebulizer and medication. 01/18/18   Dara Hoyer, FNP  rizatriptan (MAXALT) 10 MG tablet Take 1 tablet (10 mg total) by mouth 3 (three) times daily as needed for migraine. 08/29/18   Kathrynn Ducking, MD  SYMBICORT 160-4.5 MCG/ACT inhaler INHALE 2 PUFFS INTO THE LUNGS DAILY. 05/09/18   Valentina Shaggy, MD    Family History Family History  Problem Relation Age of Onset  . Thyroid disease Mother   . Diabetes Maternal Grandmother   . Heart disease Maternal Grandfather     Social History Social History   Tobacco Use  . Smoking status: Never Smoker  . Smokeless tobacco: Never Used  Substance Use Topics  . Alcohol use: Yes    Comment: 03/08/2017 "might have a drink on a holiday"  . Drug use: No     Allergies   Ginger; Passion fruit flavor [flavoring agent]; Peanuts [peanut oil]; Shellfish allergy; and Tamiflu [oseltamivir phosphate]   Review of Systems Review of Systems  All other systems reviewed and are negative.    Physical Exam Updated Vital Signs BP 139/87 (BP Location: Right Arm)   Pulse 100   Temp 98.4 F (36.9 C) (Oral)   Resp 16   SpO2 100%   Physical Exam Vitals signs and nursing note reviewed.  Constitutional:      Appearance: She is well-developed.  HENT:     Head: Normocephalic and atraumatic.  Eyes:     Conjunctiva/sclera: Conjunctivae normal.     Pupils: Pupils are equal, round, and reactive to light.  Neck:     Musculoskeletal: Normal range of motion and neck supple.  Cardiovascular:     Rate and Rhythm: Normal rate and regular rhythm.     Heart sounds: No murmur. No friction rub. No gallop.   Pulmonary:     Effort: Pulmonary effort is normal. No respiratory distress.     Breath sounds: Normal breath sounds. No wheezing or rales.  Chest:     Chest wall: No tenderness.  Abdominal:     General: Bowel sounds are normal. There is no distension.     Palpations: Abdomen is soft. There is no mass.     Tenderness: There is no abdominal  tenderness. There is no guarding or rebound.  Musculoskeletal: Normal range of motion.        General: No tenderness.  Skin:    General: Skin is warm and dry.  Neurological:     Mental Status: She is alert and oriented to person, place, and time.  Psychiatric:        Behavior: Behavior normal.        Thought Content: Thought content normal.  Judgment: Judgment normal.      ED Treatments / Results  Labs (all labs ordered are listed, but only abnormal results are displayed) Labs Reviewed  CBC - Abnormal; Notable for the following components:      Result Value   Hemoglobin 11.8 (*)    All other components within normal limits  CBG MONITORING, ED - Abnormal; Notable for the following components:   Glucose-Capillary 144 (*)    All other components within normal limits  BASIC METABOLIC PANEL  I-STAT BETA HCG BLOOD, ED ([facility], [facility], [facility] ONLY)    EKG None  Radiology No results found.  Procedures Procedures (including critical care time)  Medications Ordered in ED Medications - No data to display   Initial Impression / Assessment and Plan / ED Course  I have reviewed the triage vital signs and the nursing notes.  Pertinent labs & imaging results that were available during my care of the patient were reviewed by me and considered in my medical decision making (see chart for details).        Patient presents with symptoms of headache and nausea.  She states that she was recently diagnosed with diabetes and was concerned about her blood sugar today.  Her blood sugar is 144 as checked in triage.  Anion gap is 6, she is not acidotic.  She is well-appearing.  Her vital signs are stable.  Lab work-up is reassuring.  Do not feel that the patient requires any further investigation or admission to the hospital and can be safely discharged home with primary care follow-up.  Final Clinical Impressions(s) / ED Diagnoses   Final diagnoses:  Shaking    ED Discharge Orders    None        Montine Circle, PA-C 10/30/18 0043    Ezequiel Essex, MD 10/30/18 [phone]

## 2018-10-30 NOTE — Telephone Encounter (Signed)
Please advise 

## 2018-10-30 NOTE — Telephone Encounter (Signed)
Pt called in c/o having a headache, feeling shaky and abd pain.   New diagnosed diabetic on the 18th.     She has been eating candy all night to get rid of the shakiness and low blood sugar.   See readings below.  She went to the [facility] ED last night for these same symptoms and talked with a triage nurse yesterday afternoon before going to the ED.  Coronavirus pandemic going on so pts are not being seen in the office.   She is ok with a WebEx/phone call visit.   I have sent these notes to Dr. Leretha Pol high priority for further disposition.    Reason for Disposition . [1] Morning (before breakfast) blood glucose < 80 mg/dL (4.4 mmol/L) AND [5] more than once in past week  Answer Assessment - Initial Assessment Questions 1. LOCATION: "Where does it hurt?"      Headache yesterday.  I called in with stomach pains AND NAUSEA yesterday.     My glucose level keeps falling.  186 at 7:00am.   156 now.   I'm shaking.   Not taking my medicine yet.   I ate already this morning.   [facility] with same symptoms.   I'm a new diabetic on the 18th.  I've been eating candy all night to keep levels up.    77, 101, 84 9:42PM, 6:52 PM 99. 2. RADIATION: "Does the pain shoot anywhere else?" (e.g., chest, back)     *No Answer* 3. ONSET: "When did the pain begin?" (e.g., minutes, hours or days ago)      *No Answer* 4. SUDDEN: "Gradual or sudden onset?"     *No Answer* 5. PATTERN "Does the pain come and go, or is it constant?"    - If constant: "Is it getting better, staying the same, or worsening?"      (Note: Constant means the pain never goes away completely; most serious pain is constant and it progresses)     - If intermittent: "How long does it last?" "Do you have pain now?"     (Note: Intermittent means the pain goes away completely between bouts)     *No Answer* 6. SEVERITY: "How bad is the pain?"  (e.g., Scale 1-10; mild, moderate, or severe)   - MILD (1-3): doesn't interfere with  normal activities, abdomen soft and not tender to touch    - MODERATE (4-7): interferes with normal activities or awakens from sleep, tender to touch    - SEVERE (8-10): excruciating pain, doubled over, unable to do any normal activities      *No Answer* 7. RECURRENT SYMPTOM: "Have you ever had this type of abdominal pain before?" If so, ask: "When was the last time?" and "What happened that time?"      *No Answer* 8. CAUSE: "What do you think is causing the abdominal pain?"     *No Answer* 9. RELIEVING/AGGRAVATING FACTORS: "What makes it better or worse?" (e.g., movement, antacids, bowel movement)     *No Answer* 10. OTHER SYMPTOMS: "Has there been any vomiting, diarrhea, constipation, or urine problems?"       *No Answer* 11. PREGNANCY: "Is there any chance you are pregnant?" "When was your last menstrual period?"       *No Answer*  Answer Assessment - Initial Assessment Questions 1. SYMPTOMS: "What symptoms are you concerned about?"     I'm shaky have abd pain, headache.   These are the same symptoms I  had when went to the ED last night and talked with a triage nurse before I went. 2. ONSET:  "When did the symptoms start?"     All these symptoms started yesterday. 3. BLOOD GLUCOSE: "What is your blood glucose level?"      See notes. 4. USUAL RANGE: "What is your blood glucose level usually?" (e.g., usual fasting morning value, usual evening value)     Newly diagnosed 5. TYPE 1 or 2:  "Do you know what type of diabetes you have?"  (e.g., Type 1, Type 2, Gestational; doesn't know)      Type II 6. INSULIN: "Do you take insulin?" "What type of insulin(s) do you use? What is the mode of delivery? (syringe, pen (e.g., injection or  pump)      No insulin 7. DIABETES PILLS: "Do you take any pills for your diabetes?"     Yes 8. OTHER SYMPTOMS: "Do you have any symptoms?" (e.g., fever, frequent urination, difficulty breathing, vomiting)     abd pain, headache, shaky, glucose all over the  place. 9. LOW BLOOD GLUCOSE TREATMENT: "What have you done so far to treat the low blood glucose level?"     Ate Oatmeal 10. FOOD: "When did you last eat or drink?"       Oatmeal this morning 5:15AM 11. ALONE: "Are you alone right now or is someone with you?"        Someone is with me. 12. PREGNANCY: "Is there any chance you are pregnant?" "When was your last menstrual period?"       Not asked  Protocols used: DIABETES - LOW BLOOD SUGAR-A-AH, ABDOMINAL PAIN - [facility]

## 2018-10-30 NOTE — ED Notes (Signed)
ED Provider at bedside. 

## 2018-10-31 MED ORDER — METFORMIN HCL 500 MG PO TABS: 500.0000 mg | ORAL_TABLET | Freq: Two times a day (BID) | ORAL | 0 refills | Status: DC

## 2018-10-31 NOTE — Addendum Note (Signed)
Addended by: Myles Lipps on: 10/31/2018 07:39 PM   Modules accepted: Orders

## 2018-10-31 NOTE — Telephone Encounter (Signed)
Spoke with patient Seen in er on 31st of march Was having recurring lows, down to the 70s Still taking metformin-glipizide, takes with food Yesterday 120, 140, she had applesauce at bedtime This morning 101, this evening 129 about 30 min after dinner  Stopped metformin-glipizide 500-5mg  BID Start metformin 500mg  BID followup with me in 2 weeks, telemedicine visit

## 2018-11-05 ENCOUNTER — Other Ambulatory Visit: Payer: Self-pay

## 2018-11-05 ENCOUNTER — Encounter (HOSPITAL_COMMUNITY): Payer: Self-pay

## 2018-11-05 ENCOUNTER — Ambulatory Visit (HOSPITAL_COMMUNITY)
Admission: EM | Admit: 2018-11-05 | Discharge: 2018-11-05 | Disposition: A | Discharge status: Home / Self Care | Payer: Managed Care, Other (non HMO) | Attending: Family Medicine | Admitting: Family Medicine

## 2018-11-05 DIAGNOSIS — R300 Dysuria: Secondary | ICD-10-CM | POA: Diagnosis not present

## 2018-11-05 LAB — POCT URINALYSIS DIP (DEVICE)
Bilirubin Urine: NEGATIVE
Glucose, UA: NEGATIVE mg/dL
Hgb urine dipstick: NEGATIVE
Ketones, ur: NEGATIVE mg/dL
Leukocytes,Ua: NEGATIVE
Nitrite: NEGATIVE
Protein, ur: NEGATIVE mg/dL
Specific Gravity, Urine: 1.025 (ref 1.005–1.030)
Urobilinogen, UA: 0.2 mg/dL (ref 0.0–1.0)
pH: 6.5 (ref 5.0–8.0)

## 2018-11-05 MED ORDER — PHENAZOPYRIDINE HCL 200 MG PO TABS: 200.0000 mg | ORAL_TABLET | Freq: Three times a day (TID) | ORAL | 0 refills | Status: DC

## 2018-11-05 NOTE — Discharge Instructions (Signed)
Urine did not show signs of infection.    Push fluids and get plenty of rest.   Take pyridium as prescribed and as needed for symptomatic relief Follow up with PCP if symptoms persists Return here or go to ER if you have any new or worsening symptoms such as fever, worsening abdominal pain, nausea/vomiting, flank pain, etc..Marland Kitchen

## 2018-11-05 NOTE — ED Provider Notes (Signed)
[facility]   CC: UTI symptoms  SUBJECTIVE:  Sandra Moody is a 48 y.o. female who complains of discomfort with urination x 2 days.  Patient denies a precipitating event, recent sexual encounter, or excessive caffeine intake.  Recently diagnosed with DM type 2 and started on metformin.  Admits to LT low back pain.  Has not tried OTC medications.  Symptoms are made worse with urination.  Complains of associated nausea, and chills.  Denies fever, chills, vomiting, abdominal pain, flank pain, abnormal vaginal discharge or bleeding, hematuria.    LMP: No LMP recorded. Patient has had an injection.  ROS: As in HPI.  Past Medical History:  Diagnosis Date  . Acute renal disease   . Asthma   . Common migraine with intractable migraine 08/29/2018  . Daily headache    (03/08/2017)  . GERD (gastroesophageal reflux disease)   . H pylori ulcer ~ 08/2016  . History of hiatal hernia   . Leaky heart valve ~ 09/2016  . Migraine    "maybe weekly" (03/08/2017)  . PONV (postoperative nausea and vomiting)    Past Surgical History:  Procedure Laterality Date  . CHOLECYSTECTOMY N/A 03/08/2017   Procedure: LAPAROSCOPIC CHOLECYSTECTOMY;  Surgeon: Coralie Keens, MD;  Location: [facility];  Service: General;  Laterality: N/A;  . DILATION AND CURETTAGE OF UTERUS    . LAPAROSCOPIC CHOLECYSTECTOMY  03/08/2017  . TUBAL LIGATION     Allergies  Allergen Reactions  . Ginger Itching and Swelling  . Passion Fruit Flavor [Flavoring Agent] Itching and Swelling  . Peanuts [Peanut Oil] Anaphylaxis    PT ALLERGIC TO ALL TREE NUTS  . Shellfish Allergy Anaphylaxis  . Tamiflu [Oseltamivir Phosphate]    No current facility-administered medications on file prior to encounter.    Current Outpatient Medications on File Prior to Encounter  Medication Sig Dispense Refill  . ALBUTEROL SULFATE PO Take by mouth.    Marland Kitchen amLODipine (NORVASC) 2.5 MG tablet Take 2.5 mg by mouth daily.  0  . baclofen (LIORESAL) 10 MG  tablet Take 10 mg by mouth 3 (three) times daily.    . blood glucose meter kit and supplies Per insurance preference. Use to check glucose twice a day (fasting and 2 hours after dinner)  Dx E11.65 1 each 5  . chlorproMAZINE (THORAZINE) 25 MG tablet Take 25 mg by mouth 3 (three) times daily.    Marland Kitchen EPIPEN 2-PAK 0.3 MG/0.3ML SOAJ injection Inject 0.3 mg into the muscle as needed (for allergic reaction).   2  . imipramine (TOFRANIL) 50 MG tablet Take 100 mg by mouth daily.    Marland Kitchen imipramine (TOFRANIL-PM) 100 MG capsule Take 100 mg by mouth at bedtime.    . medroxyPROGESTERone (DEPO-PROVERA) 150 MG/ML injection     . metFORMIN (GLUCOPHAGE) 500 MG tablet Take 1 tablet (500 mg total) by mouth 2 (two) times daily with a meal. 180 tablet 0  . montelukast (SINGULAIR) 10 MG tablet Take 10 mg by mouth at bedtime.    Marland Kitchen Respiratory Therapy Supplies (NEBULIZER/TUBING/MOUTHPIECE) KIT Use as directed with nebulizer and medication. 5 each 5  . rizatriptan (MAXALT) 10 MG tablet Take 1 tablet (10 mg total) by mouth 3 (three) times daily as needed for migraine. 10 tablet 3  . SYMBICORT 160-4.5 MCG/ACT inhaler INHALE 2 PUFFS INTO THE LUNGS DAILY. 10.2 Inhaler 0   Social History   Socioeconomic History  . Marital status: Single    Spouse name: Not on file  . Number of children:  3  . Years of education: Not on file  . Highest education level: 12th grade  Occupational History    Comment: [employer]   Social Needs  . Financial resource strain: Not on file  . Food insecurity:    Worry: Not on file    Inability: Not on file  . Transportation needs:    Medical: Not on file    Non-medical: Not on file  Tobacco Use  . Smoking status: Never Smoker  . Smokeless tobacco: Never Used  Substance and Sexual Activity  . Alcohol use: Yes    Comment: 03/08/2017 "might have a drink on a holiday"  . Drug use: No  . Sexual activity: Yes    Partners: Male    Birth control/protection: Injection  Lifestyle  . Physical  activity:    Days per week: Not on file    Minutes per session: Not on file  . Stress: Not on file  Relationships  . Social connections:    Talks on phone: Not on file    Gets together: Not on file    Attends religious service: Not on file    Active member of club or organization: Not on file    Attends meetings of clubs or organizations: Not on file    Relationship status: Not on file  . Intimate partner violence:    Fear of current or ex partner: Not on file    Emotionally abused: Not on file    Physically abused: Not on file    Forced sexual activity: Not on file  Other Topics Concern  . Not on file  Social History Narrative   ** Merged History Encounter **   Right handed    Lives at home with significant other 2 cups daily of caffeine    Family History  Problem Relation Age of Onset  . Thyroid disease Mother   . Diabetes Maternal Grandmother   . Heart disease Maternal Grandfather     OBJECTIVE:  Vitals:   11/05/18 1156 11/05/18 1158  BP:  121/81  Pulse:  100  Resp:  19  Temp:  98.3 F (36.8 C)  TempSrc:  Oral  SpO2:  100%  Weight: 155 lb (70.3 kg)    General appearance: Alert in no acute distress HEENT: NCAT.  Oropharynx clear.  Lungs: clear to auscultation bilaterally without adventitious breath sounds Heart: regular rate and rhythm.  Radial pulses 2+ symmetrical bilaterally Abdomen: soft; non-distended; no tenderness; bowel sounds present; no guarding Back: no CVA tenderness Extremities: no edema; symmetrical with no gross deformities Skin: warm and dry Neurologic: Ambulates from chair to exam table without difficulty Psychological: alert and cooperative; normal mood and affect  Labs Reviewed  POCT URINALYSIS DIP (DEVICE)   Results for orders placed or performed during the hospital encounter of 11/05/18 (from the past 24 hour(s))  POCT urinalysis dip (device)     Status: None   Collection Time: 11/05/18 12:03 PM  Result Value Ref Range   Glucose, UA  NEGATIVE NEGATIVE mg/dL   Bilirubin Urine NEGATIVE NEGATIVE   Ketones, ur NEGATIVE NEGATIVE mg/dL   Specific Gravity, Urine 1.025 1.005 - 1.030   Hgb urine dipstick NEGATIVE NEGATIVE   pH 6.5 5.0 - 8.0   Protein, ur NEGATIVE NEGATIVE mg/dL   Urobilinogen, UA 0.2 0.0 - 1.0 mg/dL   Nitrite NEGATIVE NEGATIVE   Leukocytes,Ua NEGATIVE NEGATIVE     ASSESSMENT & PLAN:  1. Dysuria     Meds ordered this encounter  Medications  .  phenazopyridine (PYRIDIUM) 200 MG tablet    Sig: Take 1 tablet (200 mg total) by mouth 3 (three) times daily.    Dispense:  6 tablet    Refill:  0    Order Specific Question:   Supervising Provider    Answer:   Raylene Everts [[id number]]   Urine did not show signs of infection.    Push fluids and get plenty of rest.   Take pyridium as prescribed and as needed for symptomatic relief Follow up with PCP if symptoms persists Return here or go to ER if you have any new or worsening symptoms such as fever, worsening abdominal pain, nausea/vomiting, flank pain, etc...  Outlined signs and symptoms indicating need for more acute intervention. Patient verbalized understanding. After Visit Summary given.     Lestine Box, PA-C 11/05/18 1233

## 2018-11-05 NOTE — ED Triage Notes (Signed)
Pt states she thinks she has a UTI. Pt states she has a lot of pressure when voiding and lower back pain. This happened 2 days ago.

## 2018-11-07 ENCOUNTER — Other Ambulatory Visit: Payer: Self-pay

## 2018-11-07 ENCOUNTER — Encounter: Payer: Managed Care, Other (non HMO) | Admitting: Family Medicine

## 2018-11-07 MED ORDER — AMLODIPINE BESYLATE 2.5 MG PO TABS: 2.5000 mg | ORAL_TABLET | Freq: Every day | ORAL | 0 refills | Status: DC

## 2018-11-07 NOTE — Progress Notes (Signed)
Chief complaint : X 1 day metal taste in mouth Dizziness and stomach issues. Blood  Sugar 159  Pt states that she has been taking Tums for the last few days.  Called twice without answer, ~ 20 minutes apart She is at work I have asked that she reschedules in person.

## 2018-11-18 ENCOUNTER — Ambulatory Visit (INDEPENDENT_AMBULATORY_CARE_PROVIDER_SITE_OTHER): Payer: Managed Care, Other (non HMO) | Admitting: Family Medicine

## 2018-11-18 ENCOUNTER — Other Ambulatory Visit: Payer: Self-pay

## 2018-11-18 ENCOUNTER — Encounter: Payer: Self-pay | Admitting: Family Medicine

## 2018-11-18 VITALS — BP 124/85 | HR 100 | Temp 98.3°F | Ht 64.17 in | Wt 157.6 lb

## 2018-11-18 DIAGNOSIS — E1165 Type 2 diabetes mellitus with hyperglycemia: Secondary | ICD-10-CM | POA: Diagnosis not present

## 2018-11-18 DIAGNOSIS — M6530 Trigger finger, unspecified finger: Secondary | ICD-10-CM | POA: Diagnosis not present

## 2018-11-18 MED ORDER — MELOXICAM 15 MG PO TABS: 15.0000 mg | ORAL_TABLET | Freq: Every day | ORAL | 0 refills | Status: DC

## 2018-11-18 NOTE — Progress Notes (Signed)
4/20/20203:21 PM  Sandra Moody February 01, 1971, 48 y.o., female [id number]  Chief Complaint  Patient presents with  . Follow-up    pt is still having the nausea, says nausea cimes after taking the metformin and norvasc shich is taken in the mrning.   . Pain    pain in both hands has not stotpped, also has a lump in the left arm from accident with her dog    HPI:   Patient is a 48 y.o. female with past medical history significant for HTN, migraines, asthma, GERD, biliary dyskinesia, who presents today for followup  Last OV march 2020 Started glipizide 38m BID - had to stop due to recurring hypoglycemia, which has not happened Checking cbgs BID, in the morning 113-120s At bedtime, is lower than 125 she will taka a little snack, goal is ~ 180 7 day overall avg 152, glucomter review Having sx when elevated or low: headaches and blurry vision and abd pain  Continues to have pain, swelling and numbness of fingers, R worse L 3rd and 4th finger of both hands wont close completely - get stuck Cant open her medication bottles, has had to place foam on steering wheel Has been taking APAP and aspercream Hands feels tired Right handed  Lab Results  Component Value Date   HGBA1C 10.3 (H) 10/01/2018   Lab Results  Component Value Date   CREATININE 1.03 (H) 10/29/2018     Fall Risk  11/18/2018 11/07/2018 10/16/2018 09/27/2018 09/17/2018  Falls in the past year? 0 0 0 0 0  Number falls in past yr: 0 0 0 0 0  Injury with Fall? 0 0 0 0 0  Follow up - Falls evaluation completed - Falls evaluation completed Falls evaluation completed     Dep[facility] 11/18/2018 11/07/2018 10/16/2018  Decreased Interest - 0 0  Down, Depressed, Hopeless 0 0 0  PHQ - 2 Score 0 0 0    Allergies  Allergen Reactions  . Ginger Itching and Swelling  . Passion Fruit Flavor [Flavoring Agent] Itching and Swelling  . Peanuts [Peanut Oil] Anaphylaxis    PT ALLERGIC TO ALL TREE NUTS  . Shellfish Allergy  Anaphylaxis  . Tamiflu [Oseltamivir Phosphate]     Prior to Admission medications   Medication Sig Start Date End Date Taking? Authorizing Provider  ACCU-CHEK GUIDE test strip USE TO CHECK GLUCOSE TWICE DAILY,FASTING AND 2 HOURS AFTER DINNER 10/30/18  Yes [provider]  ALBUTEROL SULFATE PO Take by mouth.   Yes [provider]  amLODipine (NORVASC) 2.5 MG tablet Take 1 tablet (2.5 mg total) by mouth daily. 11/07/18  Yes SRutherford Guys MD  baclofen (LIORESAL) 10 MG tablet Take 10 mg by mouth 3 (three) times daily.   Yes [provider]  blood glucose meter kit and supplies Per insurance preference. Use to check glucose twice a day (fasting and 2 hours after dinner)  Dx E11.65 10/30/18  Yes SRutherford Guys MD  chlorproMAZINE (THORAZINE) 25 MG tablet Take 25 mg by mouth 3 (three) times daily.   Yes [provider]  EPIPEN 2-PAK 0.3 MG/0.3ML SOAJ injection Inject 0.3 mg into the muscle as needed (for allergic reaction).  05/18/16  Yes [provider]  imipramine (TOFRANIL) 50 MG tablet Take 100 mg by mouth daily. 09/21/18  Yes [provider]  imipramine (TOFRANIL-PM) 100 MG capsule Take 100 mg by mouth at bedtime.   Yes [provider]  medroxyPROGESTERone (DEPO-PROVERA) 150 MG/ML injection  08/15/18  Yes [provider]  metFORMIN (GLUCOPHAGE) 500 MG tablet Take 1 tablet (500 mg total) by mouth 2 (two) times daily with a meal. 10/31/18  Yes Rutherford Guys, MD  montelukast (SINGULAIR) 10 MG tablet Take 10 mg by mouth at bedtime.   Yes [provider]  phenazopyridine (PYRIDIUM) 200 MG tablet Take 1 tablet (200 mg total) by mouth 3 (three) times daily. 11/05/18  Yes [place], [place], PA-C  rizatriptan (MAXALT) 10 MG tablet Take 1 tablet (10 mg total) by mouth 3 (three) times daily as needed for migraine. 08/29/18  Yes Kathrynn Ducking, MD  SYMBICORT 160-4.5 MCG/ACT inhaler INHALE 2 PUFFS INTO THE LUNGS DAILY. 05/09/18  Yes  Valentina Shaggy, MD  fluticasone [facility]) 50 MCG/ACT nasal spray USE 1 2 SPRAYS IN EACH NOSTRIL ONCE A DAY 10/25/18   [provider]  levocetirizine (XYZAL) 5 MG tablet 1 2 TABLET IN THE EVENING ONCE A DAY ORALLY 15 11/05/18   [provider]    Past Medical History:  Diagnosis Date  . Acute renal disease   . Asthma   . Common migraine with intractable migraine 08/29/2018  . Daily headache    (03/08/2017)  . GERD (gastroesophageal reflux disease)   . H pylori ulcer ~ 08/2016  . History of hiatal hernia   . Leaky heart valve ~ 09/2016  . Migraine    "maybe weekly" (03/08/2017)  . PONV (postoperative nausea and vomiting)     Past Surgical History:  Procedure Laterality Date  . CHOLECYSTECTOMY N/A 03/08/2017   Procedure: LAPAROSCOPIC CHOLECYSTECTOMY;  Surgeon: Coralie Keens, MD;  Location: [facility];  Service: General;  Laterality: N/A;  . DILATION AND CURETTAGE OF UTERUS    . LAPAROSCOPIC CHOLECYSTECTOMY  03/08/2017  . TUBAL LIGATION      Social History   Tobacco Use  . Smoking status: Never Smoker  . Smokeless tobacco: Never Used  Substance Use Topics  . Alcohol use: Yes    Comment: 03/08/2017 "might have a drink on a holiday"    Family History  Problem Relation Age of Onset  . Thyroid disease Mother   . Diabetes Maternal Grandmother   . Heart disease Maternal Grandfather     ROS Per hpi  OBJECTIVE:  Today's Vitals   11/18/18 1429  BP: 124/85  Pulse: 100  Temp: 98.3 F (36.8 C)  TempSrc: Oral  SpO2: 97%  Weight: 157 lb 9.6 oz (71.5 kg)  Height: 5' 4.17" (1.63 m)   Body mass index is 26.91 kg/m.  Wt Readings from Last 3 Encounters:  11/18/18 157 lb 9.6 oz (71.5 kg)  11/05/18 155 lb (70.3 kg)  10/16/18 155 lb (70.3 kg)   BP Readings from Last 3 Encounters:  11/18/18 124/85  11/05/18 121/81  10/30/18 127/90    Physical Exam Vitals signs and nursing note reviewed.  Constitutional:      Appearance: She is well-developed.  HENT:      Head: Normocephalic and atraumatic.  Eyes:     General: No scleral icterus.    Conjunctiva/sclera: Conjunctivae normal.     Pupils: Pupils are equal, round, and reactive to light.  Neck:     Musculoskeletal: Neck supple.  Pulmonary:     Effort: Pulmonary effort is normal.  Musculoskeletal:     Comments: Bilateral hands: wrist and fingers FROM, no swelling. TTP at base of MCP joint of RIGHT 4th finger Negative tinsel and phalen Sensation and pulses intact Grip limited by pain  Skin:    General: Skin is warm and dry.  Neurological:     Mental Status: She is alert and oriented to person, place, and time.     ASSESSMENT and PLAN  1. Type 2 diabetes mellitus with hyperglycemia, without long-term current use of insulin (HCC) 7 day avg at goal. Cont with metformin BID and LFM. Further medication adjustment per lab results - Lipid panel - TSH - CMP14+EGFR - Microalbumin / creatinine urine ratio  2. Trigger finger, acquired Discussed supportive measures, new med r/se/b. Referring to hand surgeon to further discuss treatment, might benefit from steroid injections.  - Ambulatory referral to Hand Surgery - meloxicam (MOBIC) 15 MG tablet; Take 1 tablet (15 mg total) by mouth daily.  Return in about 3 months (around 02/17/2019) for diabetes.    Rutherford Guys, MD [facility] [place], [place] [address] Ph.  [phone] Fax [phone]

## 2018-11-18 NOTE — Patient Instructions (Signed)
° ° ° °  If you have lab work done today you will be contacted with your lab results within the next 2 weeks.  If you have not heard from us then please contact us. The fastest way to get your results is to register for My Chart. ° ° °IF you received an x-ray today, you will receive an invoice from [facility]. Please contact [facility] at [phone] with questions or concerns regarding your invoice.  ° °IF you received labwork today, you will receive an invoice from [facility]. Please contact [facility] at [phone] with questions or concerns regarding your invoice.  ° °Our billing staff will not be able to assist you with questions regarding bills from these companies. ° °You will be contacted with the lab results as soon as they are available. The fastest way to get your results is to activate your My Chart account. Instructions are located on the last page of this paperwork. If you have not heard from us regarding the results in 2 weeks, please contact this office. °  ° ° ° °

## 2018-11-19 LAB — MICROALBUMIN / CREATININE URINE RATIO
Creatinine, Urine: 264.8 mg/dL
Microalb/Creat Ratio: 14 mg/g creat (ref 0–29)
Microalbumin, Urine: 36.9 ug/mL

## 2018-11-19 LAB — CMP14+EGFR
ALT: 24 IU/L (ref 0–32)
AST: 20 IU/L (ref 0–40)
Albumin/Globulin Ratio: 1.9 (ref 1.2–2.2)
Albumin: 4.5 g/dL (ref 3.8–4.8)
Alkaline Phosphatase: 92 IU/L (ref 39–117)
BUN/Creatinine Ratio: 7 — ABNORMAL LOW (ref 9–23)
BUN: 6 mg/dL (ref 6–24)
Bilirubin Total: <0.2 mg/dL (ref 0.0–1.2)
CO2: 21 mmol/L (ref 20–29)
Calcium: 9.6 mg/dL (ref 8.7–10.2)
Chloride: 103 mmol/L (ref 96–106)
Creatinine, Ser: 0.88 mg/dL (ref 0.57–1.00)
GFR calc Af Amer: 91 mL/min/{1.73_m2} (ref >59)
GFR calc non Af Amer: 79 mL/min/{1.73_m2} (ref >59)
Globulin, Total: 2.4 g/dL (ref 1.5–4.5)
Glucose: 96 mg/dL (ref 65–99)
Potassium: 4.2 mmol/L (ref 3.5–5.2)
Sodium: 143 mmol/L (ref 134–144)
Total Protein: 6.9 g/dL (ref 6.0–8.5)

## 2018-11-19 LAB — LIPID PANEL
Chol/HDL Ratio: 4.9 ratio — ABNORMAL HIGH (ref 0.0–4.4)
Cholesterol, Total: 148 mg/dL (ref 100–199)
HDL: 30 mg/dL — ABNORMAL LOW (ref >39)
LDL Calculated: 84 mg/dL (ref 0–99)
Triglycerides: 170 mg/dL — ABNORMAL HIGH (ref 0–149)
VLDL Cholesterol Cal: 34 mg/dL (ref 5–40)

## 2018-11-19 LAB — TSH: TSH: 1.56 u[IU]/mL (ref 0.450–4.500)

## 2018-11-25 ENCOUNTER — Telehealth: Payer: Managed Care, Other (non HMO) | Admitting: Family Medicine

## 2018-11-26 ENCOUNTER — Other Ambulatory Visit: Payer: Self-pay

## 2018-11-26 ENCOUNTER — Ambulatory Visit (INDEPENDENT_AMBULATORY_CARE_PROVIDER_SITE_OTHER): Payer: Managed Care, Other (non HMO) | Admitting: Orthopaedic Surgery

## 2018-11-26 ENCOUNTER — Encounter (INDEPENDENT_AMBULATORY_CARE_PROVIDER_SITE_OTHER): Payer: Self-pay | Admitting: Orthopaedic Surgery

## 2018-11-26 VITALS — BP 129/90 | HR 108 | Ht 64.0 in | Wt 156.0 lb

## 2018-11-26 DIAGNOSIS — M65331 Trigger finger, right middle finger: Secondary | ICD-10-CM

## 2018-11-26 DIAGNOSIS — M79642 Pain in left hand: Secondary | ICD-10-CM

## 2018-11-26 DIAGNOSIS — M65332 Trigger finger, left middle finger: Secondary | ICD-10-CM

## 2018-11-26 DIAGNOSIS — M65341 Trigger finger, right ring finger: Secondary | ICD-10-CM | POA: Diagnosis not present

## 2018-11-26 DIAGNOSIS — M79641 Pain in right hand: Secondary | ICD-10-CM | POA: Diagnosis not present

## 2018-11-26 MED ORDER — LIDOCAINE HCL 1 % IJ SOLN
0.5000 mL | INTRAMUSCULAR | Status: AC | PRN
  Administered 2018-11-26: .5 mL

## 2018-11-26 MED ORDER — METHYLPREDNISOLONE ACETATE 40 MG/ML IJ SUSP
20.0000 mg | INTRAMUSCULAR | Status: AC | PRN
  Administered 2018-11-26: 20 mg

## 2018-11-26 MED ORDER — LIDOCAINE HCL 1 % IJ SOLN
0.3000 mL | INTRAMUSCULAR | Status: AC | PRN
  Administered 2018-11-26: .3 mL

## 2018-11-26 MED ORDER — BUPIVACAINE HCL 0.25 % IJ SOLN
0.3300 mL | INTRAMUSCULAR | Status: AC | PRN
  Administered 2018-11-26: .33 mL

## 2018-11-26 NOTE — Progress Notes (Signed)
Office Visit Note   Patient: Sandra Moody           Date of Birth: Apr 18, 1971           MRN: [id number] Visit Date: 11/26/2018              Requested by: Rutherford Guys, MD [address] [place], [place] [address] PCP: Rutherford Guys, MD   Assessment & Plan: Visit Diagnoses:  1. Pain in both hands   2. Trigger finger, right middle finger   3. Trigger finger, right ring finger   4. Trigger finger, left middle finger     Plan:  #1: Corticosteroid injection to the A1 pulley of the right long and ring finger. #2: AlumaFoam splint was placed on the ring finger to keep it from flexing at the P joints. #3: Interested she can come back for the injection of the left hand.  Follow-Up Instructions: Return if symptoms worsen or fail to improve.   Orders:  Orders Placed This Encounter  Procedures  . Trigger Point Inj  . Hand/UE Inj  . Hand/UE Inj   No orders of the defined types were placed in this encounter.     Procedures: Hand/UE Inj: R long A1 for trigger finger on 11/26/2018 9:50 AM Indications: tendon swelling Details: 27 G needle, volar approach Medications: 0.3 mL lidocaine 1 %; 20 mg methylPREDNISolone acetate 40 MG/ML; 0.33 mL bupivacaine 0.25 % Outcome: tolerated well, no immediate complications Procedure, treatment alternatives, risks and benefits explained, specific risks discussed. Consent was given by the patient. Immediately prior to procedure a time out was called to verify the correct patient, procedure, equipment, support staff and site/side marked as required. Patient was prepped and draped in the usual sterile fashion.   Hand/UE Inj: R ring A1 for trigger finger on 11/26/2018 9:54 AM Medications: 0.5 mL lidocaine 1 %; 20 mg methylPREDNISolone acetate 40 MG/ML; 0.33 mL bupivacaine 0.25 %      Clinical Data: No additional findings.   Subjective: Chief Complaint  Patient presents with  . Right Hand - Pain  . Left Hand - Pain   HPI Patient  presents today for bilateral hand pain X2 months. She said that the right side is worse than the left. She said that her hands hurt all over. She said that the pain is random, but at times they "throb and sting". She said that both hands are swollen because she is unable to wear her rings. She has also noticed that her middle and ring finger on the right hand lock up, and the left middle finger as well. She has noticed that her hands feel weak. She is right hand dominant. She saw her PCP and had x-rays taken of both hands on 09/17/18. She was given Meloxicam, but has not noticed improvement with it.    Review of Systems  Constitutional: Positive for fatigue.  HENT: Negative for ear pain.   Eyes: Negative for pain.  Respiratory: Positive for shortness of breath.   Cardiovascular: Negative for leg swelling.  Gastrointestinal: Negative for constipation and diarrhea.  Endocrine: Positive for cold intolerance. Negative for heat intolerance.  Genitourinary: Negative for difficulty urinating.  Musculoskeletal: Positive for joint swelling.  Skin: Negative for rash.  Allergic/Immunologic: Positive for food allergies.  Neurological: Positive for weakness.  Hematological: Does not bruise/bleed easily.  Psychiatric/Behavioral: Negative for sleep disturbance.     Objective: Vital Signs: BP 129/90   Pulse (!) 108   Ht '5\' 4"'$  (1.626 m)  Wt 156 lb (70.8 kg)   BMI 26.78 kg/m   Physical Exam Constitutional:      Appearance: She is well-developed.  Eyes:     Pupils: Pupils are equal, round, and reactive to light.  Pulmonary:     Effort: Pulmonary effort is normal.  Skin:    General: Skin is warm and dry.  Neurological:     Mental Status: She is alert and oriented to person, place, and time.  Psychiatric:        Behavior: Behavior normal.     Ortho Exam  Exam today reveals some mild nodularity over the tendon of the A-1 pulley of the right long and ring finger and left long finger.  Tender  to palpation at the A1 pulleys.  No occult locking at this time.  Sensation is intact to light touch and cap refill less than 2 seconds.   Specialty Comments:  No specialty comments available.  Imaging: X-rays reviewed from 09/17/2018 did not show any pathology at this time.  PMFS History: Current Outpatient Medications  Medication Sig Dispense Refill  . ACCU-CHEK GUIDE test strip USE TO CHECK GLUCOSE TWICE DAILY,FASTING AND 2 HOURS AFTER DINNER    . ALBUTEROL SULFATE PO Take by mouth.    Marland Kitchen amLODipine (NORVASC) 2.5 MG tablet Take 1 tablet (2.5 mg total) by mouth daily. 90 tablet 0  . baclofen (LIORESAL) 10 MG tablet Take 10 mg by mouth 3 (three) times daily.    . blood glucose meter kit and supplies Per insurance preference. Use to check glucose twice a day (fasting and 2 hours after dinner)  Dx E11.65 1 each 5  . chlorproMAZINE (THORAZINE) 25 MG tablet Take 25 mg by mouth 3 (three) times daily.    Marland Kitchen EPIPEN 2-PAK 0.3 MG/0.3ML SOAJ injection Inject 0.3 mg into the muscle as needed (for allergic reaction).   2  . fluticasone (FLONASE) 50 MCG/ACT nasal spray USE 1 2 SPRAYS IN EACH NOSTRIL ONCE A DAY    . imipramine (TOFRANIL) 50 MG tablet Take 100 mg by mouth daily.    Marland Kitchen imipramine (TOFRANIL-PM) 100 MG capsule Take 100 mg by mouth at bedtime.    Marland Kitchen levocetirizine (XYZAL) 5 MG tablet 1 2 TABLET IN THE EVENING ONCE A DAY ORALLY 15    . medroxyPROGESTERone (DEPO-PROVERA) 150 MG/ML injection     . meloxicam (MOBIC) 15 MG tablet Take 1 tablet (15 mg total) by mouth daily. 30 tablet 0  . metFORMIN (GLUCOPHAGE) 500 MG tablet Take 1 tablet (500 mg total) by mouth 2 (two) times daily with a meal. 180 tablet 0  . montelukast (SINGULAIR) 10 MG tablet Take 10 mg by mouth at bedtime.    . phenazopyridine (PYRIDIUM) 200 MG tablet Take 1 tablet (200 mg total) by mouth 3 (three) times daily. 6 tablet 0  . rizatriptan (MAXALT) 10 MG tablet Take 1 tablet (10 mg total) by mouth 3 (three) times daily as needed  for migraine. 10 tablet 3  . SYMBICORT 160-4.5 MCG/ACT inhaler INHALE 2 PUFFS INTO THE LUNGS DAILY. 10.2 Inhaler 0   No current facility-administered medications for this visit.     Patient Active Problem List   Diagnosis Date Noted  . Common migraine with intractable migraine 08/29/2018  . Moderate persistent asthma without complication 16/05/9603  . Seasonal and perennial allergic rhinitis 06/28/2017  . Mild persistent asthma with acute exacerbation 06/28/2017  . Gastroesophageal reflux disease 06/28/2017  . Biliary dyskinesia 03/08/2017  . Asthma 03/02/2016  .  Asthma with acute exacerbation 10/04/2015  . Other and unspecified ovarian cyst 06/17/2012   Past Medical History:  Diagnosis Date  . Acute renal disease   . Asthma   . Common migraine with intractable migraine 08/29/2018  . Daily headache    (03/08/2017)  . GERD (gastroesophageal reflux disease)   . H pylori ulcer ~ 08/2016  . History of hiatal hernia   . Leaky heart valve ~ 09/2016  . Migraine    "maybe weekly" (03/08/2017)  . PONV (postoperative nausea and vomiting)     Family History  Problem Relation Age of Onset  . Thyroid disease Mother   . Diabetes Maternal Grandmother   . Heart disease Maternal Grandfather     Past Surgical History:  Procedure Laterality Date  . CHOLECYSTECTOMY N/A 03/08/2017   Procedure: LAPAROSCOPIC CHOLECYSTECTOMY;  Surgeon: Coralie Keens, MD;  Location: [facility];  Service: General;  Laterality: N/A;  . DILATION AND CURETTAGE OF UTERUS    . LAPAROSCOPIC CHOLECYSTECTOMY  03/08/2017  . TUBAL LIGATION     Social History   Occupational History    Comment: [employer]   Tobacco Use  . Smoking status: Never Smoker  . Smokeless tobacco: Never Used  Substance and Sexual Activity  . Alcohol use: Yes    Comment: 03/08/2017 "might have a drink on a holiday"  . Drug use: No  . Sexual activity: Yes    Partners: Male    Birth control/protection: Injection

## 2018-11-28 ENCOUNTER — Ambulatory Visit: Payer: Managed Care, Other (non HMO) | Admitting: Neurology

## 2018-12-04 DIAGNOSIS — K589 Irritable bowel syndrome without diarrhea: Secondary | ICD-10-CM | POA: Insufficient documentation

## 2018-12-04 DIAGNOSIS — G43109 Migraine with aura, not intractable, without status migrainosus: Secondary | ICD-10-CM | POA: Insufficient documentation

## 2018-12-10 ENCOUNTER — Ambulatory Visit: Payer: Self-pay | Admitting: Registered"

## 2018-12-16 ENCOUNTER — Encounter: Payer: Self-pay | Admitting: *Deleted

## 2018-12-16 NOTE — Telephone Encounter (Signed)
Opened in Error.

## 2018-12-25 ENCOUNTER — Other Ambulatory Visit: Payer: Self-pay

## 2018-12-25 ENCOUNTER — Encounter: Payer: Self-pay | Admitting: Family Medicine

## 2018-12-25 ENCOUNTER — Telehealth (INDEPENDENT_AMBULATORY_CARE_PROVIDER_SITE_OTHER): Payer: Managed Care, Other (non HMO) | Admitting: Family Medicine

## 2018-12-25 VITALS — Temp 98.7°F | Ht 64.0 in | Wt 156.0 lb

## 2018-12-25 DIAGNOSIS — J018 Other acute sinusitis: Secondary | ICD-10-CM | POA: Diagnosis not present

## 2018-12-25 DIAGNOSIS — H1045 Other chronic allergic conjunctivitis: Secondary | ICD-10-CM

## 2018-12-25 MED ORDER — OLOPATADINE HCL 0.1 % OP SOLN: 1.0000 [drp] | Freq: Two times a day (BID) | OPHTHALMIC | 12 refills | Status: DC

## 2018-12-25 MED ORDER — AMOXICILLIN 875 MG PO TABS: 875.0000 mg | ORAL_TABLET | Freq: Two times a day (BID) | ORAL | 0 refills | Status: DC

## 2018-12-25 MED ORDER — FLUCONAZOLE 150 MG PO TABS: 150.0000 mg | ORAL_TABLET | Freq: Once | ORAL | 0 refills | Status: AC

## 2018-12-25 NOTE — Progress Notes (Signed)
Telemedicine Encounter- SOAP NOTE Established Patient  I discussed the limitations, risks, security and privacy concerns of performing an evaluation and management service by telephone and the availability of in person appointments. I also discussed with the patient that there may be a patient responsible charge related to this service. The patient expressed understanding and agreed to proceed.  This telephone encounter was conducted with the patient's verbal consent via audio telecommunications: yes Patient was instructed to have this encounter in a suitably private space; and to only have persons present to whom they give permission to participate. In addition, patient identity was confirmed by use of name plus two identifiers (DOB and address).  I spent a total of48mn talking with the patient   Cc:Pt c/o having all over facial pain such as teeth, eyes, ears, and throat. She is having drainage and she cut her grass yesterday which didn't help.   Subjective   Sandra ZUCKERMANis a 48y.o. female established patient. Telephone visit today for sinus congestion, itchy eyes, sore throat worse in the morning. No fever. Pt with cough productive yellow mucous-worse in the evening.  No nausea , vomiting, or diarrhea.  Pt with asthma with recent problems -using albuterol-told by allergist not to use nebulizer. Pt with irritation in the eyes-request eye drops.  Patient Active Problem List   Diagnosis Date Noted  . Common migraine with intractable migraine 08/29/2018  . Moderate persistent asthma without complication 015/83/0940 . Seasonal and perennial allergic rhinitis 06/28/2017  . Mild persistent asthma with acute exacerbation 06/28/2017  . Gastroesophageal reflux disease 06/28/2017  . Biliary dyskinesia 03/08/2017  . Asthma 03/02/2016  . Asthma with acute exacerbation 10/04/2015  . Other and unspecified ovarian cyst 06/17/2012    Past Medical History:  Diagnosis Date  . Acute renal  disease   . Asthma   . Common migraine with intractable migraine 08/29/2018  . Daily headache    (03/08/2017)  . GERD (gastroesophageal reflux disease)   . H pylori ulcer ~ 08/2016  . History of hiatal hernia   . Leaky heart valve ~ 09/2016  . Migraine    "maybe weekly" (03/08/2017)  . PONV (postoperative nausea and vomiting)     Current Outpatient Medications  Medication Sig Dispense Refill  . ACCU-CHEK GUIDE test strip USE TO CHECK GLUCOSE TWICE DAILY,FASTING AND 2 HOURS AFTER DINNER    . ALBUTEROL SULFATE PO Take by mouth.    .Marland KitchenamLODipine (NORVASC) 2.5 MG tablet Take 1 tablet (2.5 mg total) by mouth daily. 90 tablet 0  . baclofen (LIORESAL) 10 MG tablet Take 10 mg by mouth 3 (three) times daily.    . blood glucose meter kit and supplies Per insurance preference. Use to check glucose twice a day (fasting and 2 hours after dinner)  Dx E11.65 1 each 5  . chlorproMAZINE (THORAZINE) 25 MG tablet Take 25 mg by mouth 3 (three) times daily.    .Marland KitchenEPIPEN 2-PAK 0.3 MG/0.3ML SOAJ injection Inject 0.3 mg into the muscle as needed (for allergic reaction).   2  . fluticasone (FLONASE) 50 MCG/ACT nasal spray USE 1 2 SPRAYS IN EACH NOSTRIL ONCE A DAY    . imipramine (TOFRANIL) 50 MG tablet Take 100 mg by mouth daily.    .Marland Kitchenimipramine (TOFRANIL-PM) 100 MG capsule Take 100 mg by mouth at bedtime.    .Marland Kitchenlevocetirizine (XYZAL) 5 MG tablet 1 2 TABLET IN THE EVENING ONCE A DAY ORALLY 15    . medroxyPROGESTERone (  DEPO-PROVERA) 150 MG/ML injection     . metFORMIN (GLUCOPHAGE) 500 MG tablet Take 1 tablet (500 mg total) by mouth 2 (two) times daily with a meal. 180 tablet 0  . montelukast (SINGULAIR) 10 MG tablet Take 10 mg by mouth at bedtime.    . phenazopyridine (PYRIDIUM) 200 MG tablet Take 1 tablet (200 mg total) by mouth 3 (three) times daily. 6 tablet 0  . rizatriptan (MAXALT) 10 MG tablet Take 1 tablet (10 mg total) by mouth 3 (three) times daily as needed for migraine. 10 tablet 3  . SYMBICORT 160-4.5  MCG/ACT inhaler INHALE 2 PUFFS INTO THE LUNGS DAILY. 10.2 Inhaler 0  . meloxicam (MOBIC) 15 MG tablet Take 1 tablet (15 mg total) by mouth daily. (Patient not taking: Reported on 12/25/2018) 30 tablet 0   No current facility-administered medications for this visit.     Allergies  Allergen Reactions  . Ginger Itching and Swelling  . Passion Fruit Flavor [Flavoring Agent] Itching and Swelling  . Peanuts [Peanut Oil] Anaphylaxis    PT ALLERGIC TO ALL TREE NUTS  . Shellfish Allergy Anaphylaxis  . Tamiflu [Oseltamivir Phosphate]     Social History   Socioeconomic History  . Marital status: Single    Spouse name: Not on file  . Number of children: 3  . Years of education: Not on file  . Highest education level: 12th grade  Occupational History    Comment: [employer]   Social Needs  . Financial resource strain: Not on file  . Food insecurity:    Worry: Not on file    Inability: Not on file  . Transportation needs:    Medical: Not on file    Non-medical: Not on file  Tobacco Use  . Smoking status: Never Smoker  . Smokeless tobacco: Never Used  Substance and Sexual Activity  . Alcohol use: Yes    Comment: 03/08/2017 "might have a drink on a holiday"  . Drug use: No  . Sexual activity: Yes    Partners: Male    Birth control/protection: Injection  Lifestyle  . Physical activity:    Days per week: Not on file    Minutes per session: Not on file  . Stress: Not on file  Relationships  . Social connections:    Talks on phone: Not on file    Gets together: Not on file    Attends religious service: Not on file    Active member of club or organization: Not on file    Attends meetings of clubs or organizations: Not on file    Relationship status: Not on file  . Intimate partner violence:    Fear of current or ex partner: Not on file    Emotionally abused: Not on file    Physically abused: Not on file    Forced sexual activity: Not on file  Other Topics Concern  . Not on  file  Social History Narrative   ** Merged History Encounter **   Right handed    Lives at home with significant other 2 cups daily of caffeine     Review of Systems  Constitutional: Negative for chills and fever.  HENT: Positive for congestion, ear pain, sinus pain and sore throat.   Respiratory: Positive for cough, sputum production and wheezing. Negative for shortness of breath.   Cardiovascular: Negative for chest pain.  Gastrointestinal: Negative for nausea and vomiting.  Neurological: Negative for dizziness.    Objective   Vitals as reported by  the patient: Today's Vitals   12/25/18 1520  Temp: 98.7 F (37.1 C)  TempSrc: Oral  Weight: 156 lb (70.8 kg)  Height: _0  (1.626 m)  1. Other acute sinusitis, recurrence not specified Amoxil-rx mucinex DM flonase Albuterol MDI Allergic eye-patanol-rx  I discussed the assessment and treatment plan with the patient. The patient was provided an opportunity to ask questions and all were answered. The patient agreed with the plan and demonstrated an understanding of the instructions.   The patient was advised to call back or seek an in-person evaluation if the symptoms worsen or if the condition fails to improve as anticipated.  I provided 10 minutes of non-face-to-face time during this encounter.  Brick Ketcher Hannah Beat, MD  Primary Care at [facility] 12-25-18

## 2018-12-25 NOTE — Progress Notes (Signed)
Pt c/o having all over facial pain such as teeth, eyes, ears, and throat. She is having drainage and she cut her grass yesterday which didn't help.

## 2018-12-31 ENCOUNTER — Ambulatory Visit: Payer: Managed Care, Other (non HMO) | Admitting: Registered"

## 2019-01-20 ENCOUNTER — Other Ambulatory Visit: Payer: Self-pay | Admitting: Family Medicine

## 2019-01-23 ENCOUNTER — Other Ambulatory Visit: Payer: Self-pay | Admitting: Family Medicine

## 2019-01-23 NOTE — Telephone Encounter (Signed)
Forwarding medication refill to PCP for review. 

## 2019-01-27 ENCOUNTER — Telehealth: Payer: Self-pay | Admitting: *Deleted

## 2019-01-27 NOTE — Telephone Encounter (Signed)
Due to current COVID 19 pandemic, our office is severely reducing in office visits until further notice, in order to minimize the risk to our patients and healthcare providers. Unable to get in contact with the patient to convert their office visit with Judson Roch 01/28/2019 into a doxy.me visit. I left a voicemail asking the patient to return my call. Office number was provided.     If patient calls back please convert their office visit into a doxy.me visit or my chart vv.

## 2019-01-28 ENCOUNTER — Encounter: Payer: Self-pay | Admitting: Neurology

## 2019-01-28 ENCOUNTER — Other Ambulatory Visit: Payer: Self-pay

## 2019-01-28 ENCOUNTER — Ambulatory Visit (INDEPENDENT_AMBULATORY_CARE_PROVIDER_SITE_OTHER): Payer: Managed Care, Other (non HMO) | Admitting: Neurology

## 2019-01-28 ENCOUNTER — Encounter: Payer: Managed Care, Other (non HMO) | Attending: Family Medicine | Admitting: *Deleted

## 2019-01-28 ENCOUNTER — Ambulatory Visit: Payer: Managed Care, Other (non HMO) | Admitting: Neurology

## 2019-01-28 VITALS — BP 130/79 | HR 101 | Temp 98.0°F | Ht 64.0 in | Wt 153.5 lb

## 2019-01-28 DIAGNOSIS — IMO0002 Reserved for concepts with insufficient information to code with codable children: Secondary | ICD-10-CM

## 2019-01-28 DIAGNOSIS — Z0289 Encounter for other administrative examinations: Secondary | ICD-10-CM

## 2019-01-28 DIAGNOSIS — R202 Paresthesia of skin: Secondary | ICD-10-CM

## 2019-01-28 DIAGNOSIS — E1165 Type 2 diabetes mellitus with hyperglycemia: Secondary | ICD-10-CM | POA: Insufficient documentation

## 2019-01-28 DIAGNOSIS — E118 Type 2 diabetes mellitus with unspecified complications: Secondary | ICD-10-CM | POA: Diagnosis not present

## 2019-01-28 NOTE — Progress Notes (Signed)
I have read the note, and I agree with the clinical assessment and plan.  Zamyra Allensworth K Shamon Cothran   

## 2019-01-28 NOTE — Progress Notes (Addendum)
Diabetes Self-Management Education  Visit Type: First/Initial  Appt. Start Time: 1530 Appt. End Time: 1700  01/28/2019  Sandra Moody, identified by name and date of birth, is a 48 y.o. female with a diagnosis of Diabetes: Type 2. Patient states she was diagnosed with diabetes in March this year and is struggling with erratic blood sugars along with frequent headaches, and hypersensitive to BG's under 100 mg/dl. 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 She also mows her own yard and her grandfather's every week. She enjoys her dogs. She does not cook for herself, all food is take out or from a restaurant. History of migraine headaches.   ASSESSMENT  There were no vitals taken for this visit. There is no height or weight on file to calculate BMI.  Diabetes Self-Management Education - 01/28/19 1544      Visit Information   Visit Type  First/Initial      Initial Visit   Diabetes Type  Type 2    Are you currently following a meal plan?  No    Are you taking your medications as prescribed?  No    Date Diagnosed  09/2018      Health Coping   How would you rate your overall health?  Fair      Psychosocial Assessment   Patient Belief/Attitude about Diabetes  Defeat/Burnout    Self-care barriers  Other (comment)   constant headaches, numbness in fingers and toes, fatigue   Other persons present  Patient    Patient Concerns  Nutrition/Meal planning;Glycemic Control    Special Needs  None    Preferred Learning Style  No preference indicated    Learning Readiness  Change in progress    How often do you need to have someone help you when you read instructions, pamphlets, or other written materials from your doctor or pharmacy?  2 - Rarely    What is the last grade level you completed in school?  college      Pre-Education Assessment   Patient understands the diabetes disease and treatment process.  Needs Instruction    Patient understands incorporating nutritional management into lifestyle.  Needs Instruction    Patient undertands incorporating physical activity into lifestyle.  Needs Instruction    Patient understands using medications safely.  Needs Instruction    Patient understands monitoring blood glucose, interpreting and using results  Needs Instruction    Patient understands prevention, detection, and treatment of acute complications.  Needs Instruction    Patient understands prevention, detection, and treatment of chronic complications.  Needs Instruction    Patient understands how to develop strategies to address psychosocial issues.  Needs Instruction    Patient understands how to develop strategies to promote health/change behavior.  Needs Instruction      Complications   Last HgB A1C per patient/outside source  10.3 %    How often do you check your blood sugar?  1-2 times/day    Fasting Blood glucose range (mg/dL)  16-109;604-540;981-191;>47870-129;130-179;180-200;>200    Postprandial Blood glucose range (mg/dL)  29-562;130-865;784-696;>29570-129;130-179;180-200;>200    Have you had a dilated eye exam in the past 12 months?  Yes    Have you had a dental exam in the past 12 months?  Yes    Are you checking your feet?  Yes    How many days per week are you checking your feet?  7  Dietary Intake   Breakfast  Skps 3 days a week or has a packet flavored oatmeal or plain grits packet    Snack (morning)  PNB crackers x 6    Lunch  kids meal with 4 nuggets and small fries    Snack (afternoon)  not usually unless a few crackers    Dinner  grilled chicken patty, green beans OR fried chicken and green beans    Snack (evening)  not unless crackers    Beverage(s)  coffee with creamer, sweet tea, vitamin water, purified water, flavored water      Exercise   Exercise Type  Light (walking / raking leaves)   cuts her yard and her grandfather's   How many days per week to you exercise?  7    How many minutes per day do you exercise?  150    Total  minutes per week of exercise  1050      Patient Education   Disease state   Definition of diabetes, type 1 and 2, and the diagnosis of diabetes;Factors that contribute to the development of diabetes;Explored patient's options for treatment of their diabetes    Nutrition management   Role of diet in the treatment of diabetes and the relationship between the three main macronutrients and blood glucose level;Carbohydrate counting;Reviewed blood glucose goals for pre and post meals and how to evaluate the patients' food intake on their blood glucose level.    Physical activity and exercise   Role of exercise on diabetes management, blood pressure control and cardiac health.;Helped patient identify appropriate exercises in relation to his/her diabetes, diabetes complications and other health issue.    Medications  Reviewed patients medication for diabetes, action, purpose, timing of dose and side effects.    Chronic complications  Relationship between chronic complications and blood glucose control    Psychosocial adjustment  Role of stress on diabetes      Individualized Goals (developed by patient)   Nutrition  Follow meal plan discussed    Physical Activity  Exercise 3-5 times per week    Medications  take my medication as prescribed    Monitoring   test blood glucose pre and post meals as discussed;Other (comment)   informed of Libre CGM     Post-Education Assessment   Patient understands the diabetes disease and treatment process.  Demonstrates understanding / competency    Patient understands incorporating nutritional management into lifestyle.  Demonstrates understanding / competency    Patient undertands incorporating physical activity into lifestyle.  Demonstrates understanding / competency    Patient understands using medications safely.  Demonstrates understanding / competency    Patient understands monitoring blood glucose, interpreting and using results  Demonstrates understanding /  competency    Patient understands prevention, detection, and treatment of acute complications.  Demonstrates understanding / competency    Patient understands prevention, detection, and treatment of chronic complications.  Demonstrates understanding / competency    Patient understands how to develop strategies to address psychosocial issues.  Demonstrates understanding / competency    Patient understands how to develop strategies to promote health/change behavior.  Demonstrates understanding / competency      Outcomes   Expected Outcomes  Demonstrated interest in learning. Expect positive outcomes    Future DMSE  PRN    Program Status  Not Completed       Individualized Plan for Diabetes Self-Management Training:   Learning Objective:  Patient will have a greater understanding of diabetes self-management. Patient education plan  is to attend individual and/or group sessions per assessed needs and concerns.   Plan:   Patient Instructions  Plan:  Aim for 2 Carb Choices per meal (30 grams) +/- 1 either way  Aim for 0-2 Carbs per snack if hungry  Include protein in moderation with your meals and snacks Continue with your activity level by mowing grass or walking  as tolerated Continue checking BG at alternate times per day  Continue taking medication as directed by MD  Expected Outcomes:  Demonstrated interest in learning. Expect positive outcomes  Education material provided: A1C conversion sheet, Meal plan card and Carbohydrate counting sheet, [employer] brochure  If problems or questions, patient to contact team via:  Phone  Future DSME appointment: PRN

## 2019-01-28 NOTE — Progress Notes (Addendum)
PATIENT: Sandra Moody DOB: 1971/03/09  REASON FOR VISIT: follow up HISTORY FROM: patient  HISTORY OF PRESENT ILLNESS: Today 01/28/19  Sandra Moody Is a 48 year old female with history of migraine headaches since she was a teenager.  She is currently taking Topamax and imipramine 100 mg at bedtime, Maxalt as needed.  Since her last visit she has been diagnosed with diabetes.  In March, her A1c was 10.3.  She is currently taking metformin alone.  She reports over the last few months a sensation of crawling, tingling in her hands, feet, and legs, at times is like muscle spasms. This occur on a nearly daily basis.  She says she feels like something is running through her fingers and toes.  She reports her sugars have been running around 120 in the morning, and the near 300s in the evening.  With the hyperglycemia, she notices an increase in her headaches.  She reports a headache almost every other day.  She is going to see a nutritionist today.  She reports when she had a headache, she will take Maxalt, only on the weekends.  She has still been able to work.  She is to see her primary in July for a recheck on her A1c.  She was surprised to find that she had diabetes in March.   HISTORY  08/29/2018 Dr. Jannifer Franklin: Sandra Moody is a 48 year old black female with a history of migraine headaches since she was a teenager.  The patient denies a family history of migraine however.  She does have a history of irritable bowel syndrome.  She has been followed through the [facility], she has in the past been placed on Topamax and now is on imipramine taking 100 mg at night.  The patient has baclofen and chlorpromazine to take if needed for the headache, but these medications result in drowsiness.  The patient is able to sleep, this does help the headache.  The patient has been receiving nerve block shots on the head and neck every 6 to 8 weeks which seems to have helped.  The patient was having daily headaches  a year and a half ago, but now she is having only about 2 headaches a month.  The headaches however may last a day or 2.  The patient has not missed work recently because of the headache.  The headaches may start in the front and then on the top of the head.  They may be unilateral at times.  The patient reports some sinus issues at times.  She has some blurring of vision, she may have some nausea and photophobia and phonophobia with the headache.  Occasionally she may have some dizziness.  She denies any numbness or weakness of the face, arms, legs.  She claims that if she gets cold, this may bring on a headache.  She drinks usually on average 1 cup of coffee and one ice tea daily.  She is on Depo-Provera shots for birth control.  She does not believe that this has resulted in any negative impact on her headache.  The patient denies any significant neck pain.  She is sent to this office for an evaluation.   REVIEW OF SYSTEMS: Out of a complete 14 system review of symptoms, the patient complains only of the following symptoms, and all other reviewed systems are negative.  Headache, paresthesia  ALLERGIES: Allergies  Allergen Reactions  . Ginger Itching and Swelling  . Passion Fruit Flavor [Flavoring Agent] Itching and Swelling  .  Peanuts [Peanut Oil] Anaphylaxis    PT ALLERGIC TO ALL TREE NUTS  . Shellfish Allergy Anaphylaxis  . Tamiflu [Oseltamivir Phosphate]     HOME MEDICATIONS: Outpatient Medications Prior to Visit  Medication Sig Dispense Refill  . ACCU-CHEK GUIDE test strip USE TO CHECK GLUCOSE TWICE DAILY,FASTING AND 2 HOURS AFTER DINNER    . ALBUTEROL SULFATE PO Take by mouth.    Marland Kitchen amLODipine (NORVASC) 2.5 MG tablet TAKE 1 TABLET BY MOUTH EVERY DAY 30 tablet 2  . amoxicillin (AMOXIL) 875 MG tablet Take 1 tablet (875 mg total) by mouth 2 (two) times daily. 20 tablet 0  . baclofen (LIORESAL) 10 MG tablet Take 10 mg by mouth 3 (three) times daily.    . blood glucose meter kit and  supplies Per insurance preference. Use to check glucose twice a day (fasting and 2 hours after dinner)  Dx E11.65 1 each 5  . chlorproMAZINE (THORAZINE) 25 MG tablet Take 25 mg by mouth 3 (three) times daily.    Marland Kitchen EPIPEN 2-PAK 0.3 MG/0.3ML SOAJ injection Inject 0.3 mg into the muscle as needed (for allergic reaction).   2  . fluticasone (FLONASE) 50 MCG/ACT nasal spray USE 1 2 SPRAYS IN EACH NOSTRIL ONCE A DAY    . imipramine (TOFRANIL) 50 MG tablet Take 100 mg by mouth daily.    Marland Kitchen imipramine (TOFRANIL-PM) 100 MG capsule Take 100 mg by mouth at bedtime.    Marland Kitchen levocetirizine (XYZAL) 5 MG tablet 1 2 TABLET IN THE EVENING ONCE A DAY ORALLY 15    . medroxyPROGESTERone (DEPO-PROVERA) 150 MG/ML injection     . meloxicam (MOBIC) 15 MG tablet Take 1 tablet (15 mg total) by mouth daily. (Patient not taking: Reported on 12/25/2018) 30 tablet 0  . metFORMIN (GLUCOPHAGE) 500 MG tablet TAKE 1 TABLET (500 MG TOTAL) BY MOUTH 2 (TWO) TIMES DAILY WITH A MEAL. 60 tablet 2  . montelukast (SINGULAIR) 10 MG tablet Take 10 mg by mouth at bedtime.    Marland Kitchen olopatadine (PATANOL) 0.1 % ophthalmic solution Place 1 drop into both eyes 2 (two) times daily. 5 mL 12  . phenazopyridine (PYRIDIUM) 200 MG tablet Take 1 tablet (200 mg total) by mouth 3 (three) times daily. 6 tablet 0  . rizatriptan (MAXALT) 10 MG tablet Take 1 tablet (10 mg total) by mouth 3 (three) times daily as needed for migraine. 10 tablet 3  . SYMBICORT 160-4.5 MCG/ACT inhaler INHALE 2 PUFFS INTO THE LUNGS DAILY. 10.2 Inhaler 0   No facility-administered medications prior to visit.     PAST MEDICAL HISTORY: Past Medical History:  Diagnosis Date  . Acute renal disease   . Asthma   . Common migraine with intractable migraine 08/29/2018  . Daily headache    (03/08/2017)  . GERD (gastroesophageal reflux disease)   . H pylori ulcer ~ 08/2016  . History of hiatal hernia   . Leaky heart valve ~ 09/2016  . Migraine    "maybe weekly" (03/08/2017)  . PONV  (postoperative nausea and vomiting)     PAST SURGICAL HISTORY: Past Surgical History:  Procedure Laterality Date  . CHOLECYSTECTOMY N/A 03/08/2017   Procedure: LAPAROSCOPIC CHOLECYSTECTOMY;  Surgeon: Coralie Keens, MD;  Location: [facility];  Service: General;  Laterality: N/A;  . DILATION AND CURETTAGE OF UTERUS    . LAPAROSCOPIC CHOLECYSTECTOMY  03/08/2017  . TUBAL LIGATION      FAMILY HISTORY: Family History  Problem Relation Age of Onset  . Thyroid disease Mother   .  Diabetes Maternal Grandmother   . Heart disease Maternal Grandfather     SOCIAL HISTORY: Social History   Socioeconomic History  . Marital status: Single    Spouse name: Not on file  . Number of children: 3  . Years of education: Not on file  . Highest education level: 12th grade  Occupational History    Comment: [employer]   Social Needs  . Financial resource strain: Not on file  . Food insecurity    Worry: Not on file    Inability: Not on file  . Transportation needs    Medical: Not on file    Non-medical: Not on file  Tobacco Use  . Smoking status: Never Smoker  . Smokeless tobacco: Never Used  Substance and Sexual Activity  . Alcohol use: Yes    Comment: 03/08/2017 "might have a drink on a holiday"  . Drug use: No  . Sexual activity: Yes    Partners: Male    Birth control/protection: Injection  Lifestyle  . Physical activity    Days per week: Not on file    Minutes per session: Not on file  . Stress: Not on file  Relationships  . Social Herbalist on phone: Not on file    Gets together: Not on file    Attends religious service: Not on file    Active member of club or organization: Not on file    Attends meetings of clubs or organizations: Not on file    Relationship status: Not on file  . Intimate partner violence    Fear of current or ex partner: Not on file    Emotionally abused: Not on file    Physically abused: Not on file    Forced sexual activity: Not on file   Other Topics Concern  . Not on file  Social History Narrative   ** Merged History Encounter **   Right handed    Lives at home with significant other 2 cups daily of caffeine       PHYSICAL EXAM  There were no vitals filed for this visit. There is no height or weight on file to calculate BMI.  Generalized: Well developed, in no acute distress   Neurological examination  Mentation: Alert oriented to time, place, history taking. Follows all commands speech and language fluent Cranial nerve II-XII: Pupils were equal round reactive to light. Extraocular movements were full, visual field were full on confrontational test. Facial sensation and strength were normal. Uvula tongue midline. Head turning and shoulder shrug  were normal and symmetric. Motor: The motor testing reveals 5 over 5 strength of all 4 extremities. Good symmetric motor tone is noted throughout.  Sensory: Sensory testing is intact to soft touch, pinprick and vibration on all 4 extremities. No evidence of extinction is noted.  Coordination: Cerebellar testing reveals good finger-nose-finger and heel-to-shin bilaterally.  Gait and station: Gait is normal. Tandem gait is normal. Romberg is negative. No drift is seen.  Marland Kitchen  DIAGNOSTIC DATA (LABS, IMAGING, TESTING) - I reviewed patient records, labs, notes, testing and imaging myself where available.  Lab Results  Component Value Date   WBC 7.7 10/29/2018   HGB 11.8 (L) 10/29/2018   HCT 37.6 10/29/2018   MCV 96.2 10/29/2018   PLT 320 10/29/2018      Component Value Date/Time   NA 143 11/18/2018 1623   K 4.2 11/18/2018 1623   CL 103 11/18/2018 1623   CO2 21 11/18/2018 1623  GLUCOSE 96 11/18/2018 1623   GLUCOSE 139 (H) 10/29/2018 2323   BUN 6 11/18/2018 1623   CREATININE 0.88 11/18/2018 1623   CALCIUM 9.6 11/18/2018 1623   PROT 6.9 11/18/2018 1623   ALBUMIN 4.5 11/18/2018 1623   AST 20 11/18/2018 1623   ALT 24 11/18/2018 1623   ALKPHOS 92 11/18/2018 1623    BILITOT <0.2 11/18/2018 1623   GFRNONAA 79 11/18/2018 1623   GFRAA 91 11/18/2018 1623   Lab Results  Component Value Date   CHOL 148 11/18/2018   HDL 30 (L) 11/18/2018   LDLCALC 84 11/18/2018   TRIG 170 (H) 11/18/2018   CHOLHDL 4.9 (H) 11/18/2018   Lab Results  Component Value Date   HGBA1C 10.3 (H) 10/01/2018   No results found for: VITAMINB12 Lab Results  Component Value Date   TSH 1.560 11/18/2018      ASSESSMENT AND PLAN 48 y.o. year old female  has a past medical history of Acute renal disease, Asthma, Common migraine with intractable migraine (08/29/2018), Daily headache, GERD (gastroesophageal reflux disease), H pylori ulcer (~ 08/2016), History of hiatal hernia, Leaky heart valve (~ 09/2016), Migraine, and PONV (postoperative nausea and vomiting). here with:  1.  Diabetes 2.  Migraine 3.  Paresthesia  Today she is describing an increase in her headaches, related to hyperglycemia. She also describes a new symptom of tingling, burning, sensation that something is crawling on her hands and feet.  This has been new since she was diagnosed with diabetes.  Her diabetes is not under good control, had an A1c of 10.3 in March 2020.  She is on metformin alone for management.  It sounds as though her symptoms are related to hyperglycemia, uncontrolled diabetes.  She will contact her primary care doctor, to discuss further management of diabetes to hopefully control symptoms.  I will check lab work today to include CBC, CMP, CK level for the report of paresthesia, muscle cramps.  She will follow-up in 3 months or sooner if needed.  If the symptoms have not improved, we may need to get a nerve conduction study.  She has been on Topamax for a while, does not seem like a side effect of medication.  The symptoms came on along with the diagnosis of diabetes.  If the headaches do not improve, we may need to adjust her migraine medications.  She will continue on Tofranil, Topamax for now.  I have  advised her that if her symptoms worsen or she develops any new symptoms she should let us know.   I spent 15 minutes with the patient. 50% of this time was spent discussing her plan of care.   Butler Denmark, AGNP-C, DNP 01/28/2019, 9:23 AM [facility] [address] [place], [place] [address] [phone]

## 2019-01-28 NOTE — Patient Instructions (Signed)
Please contact your primary care doctor in regards to your blood sugars and diabetes. Continue follow-up with your nutritionist.

## 2019-01-28 NOTE — Patient Instructions (Signed)
Plan:  Aim for 2 Carb Choices per meal (30 grams) +/- 1 either way  Aim for 0-2 Carbs per snack if hungry  Include protein in moderation with your meals and snacks Continue with your activity level by mowing grass or walking  as tolerated Continue checking BG at alternate times per day  Continue taking medication as directed by MD

## 2019-01-29 ENCOUNTER — Telehealth: Payer: Self-pay | Admitting: Neurology

## 2019-01-29 DIAGNOSIS — R202 Paresthesia of skin: Secondary | ICD-10-CM

## 2019-01-29 LAB — CBC WITH DIFFERENTIAL/PLATELET
Basophils Absolute: 0 10*3/uL (ref 0.0–0.2)
Basos: 1 %
EOS (ABSOLUTE): 0.1 10*3/uL (ref 0.0–0.4)
Eos: 2 %
Hematocrit: 38.6 % (ref 34.0–46.6)
Hemoglobin: 13 g/dL (ref 11.1–15.9)
Immature Grans (Abs): 0 10*3/uL (ref 0.0–0.1)
Immature Granulocytes: 0 %
Lymphocytes Absolute: 2.6 10*3/uL (ref 0.7–3.1)
Lymphs: 41 %
MCH: 30.8 pg (ref 26.6–33.0)
MCHC: 33.7 g/dL (ref 31.5–35.7)
MCV: 92 fL (ref 79–97)
Monocytes Absolute: 0.5 10*3/uL (ref 0.1–0.9)
Monocytes: 8 %
Neutrophils Absolute: 3.1 10*3/uL (ref 1.4–7.0)
Neutrophils: 48 %
Platelets: 378 10*3/uL (ref 150–450)
RBC: 4.22 x10E6/uL (ref 3.77–5.28)
RDW: 12.6 % (ref 11.7–15.4)
WBC: 6.3 10*3/uL (ref 3.4–10.8)

## 2019-01-29 LAB — COMPREHENSIVE METABOLIC PANEL
ALT: 15 IU/L (ref 0–32)
AST: 17 IU/L (ref 0–40)
Albumin/Globulin Ratio: 1.8 (ref 1.2–2.2)
Albumin: 4.4 g/dL (ref 3.8–4.8)
Alkaline Phosphatase: 84 IU/L (ref 39–117)
BUN/Creatinine Ratio: 6 — ABNORMAL LOW (ref 9–23)
BUN: 7 mg/dL (ref 6–24)
Bilirubin Total: 0.2 mg/dL (ref 0.0–1.2)
CO2: 24 mmol/L (ref 20–29)
Calcium: 9.6 mg/dL (ref 8.7–10.2)
Chloride: 104 mmol/L (ref 96–106)
Creatinine, Ser: 1.08 mg/dL — ABNORMAL HIGH (ref 0.57–1.00)
GFR calc Af Amer: 71 mL/min/{1.73_m2} (ref >59)
GFR calc non Af Amer: 62 mL/min/{1.73_m2} (ref >59)
Globulin, Total: 2.5 g/dL (ref 1.5–4.5)
Glucose: 84 mg/dL (ref 65–99)
Potassium: 4.1 mmol/L (ref 3.5–5.2)
Sodium: 143 mmol/L (ref 134–144)
Total Protein: 6.9 g/dL (ref 6.0–8.5)

## 2019-01-29 LAB — CK: Total CK: 335 U/L — ABNORMAL HIGH (ref 32–182)

## 2019-01-29 NOTE — Telephone Encounter (Signed)
Please call the patient. Her CK level is elevated 335. Other labs okay. Please get her set up for EMG nerve conduction study with Dr. Jannifer Franklin. The level could be elevated due to the muscle spasms/altered sensation she is experiencing or if she has an active peripheral neuropathy. Also, please offer her we could try low dose gabapentin maybe 300 mg twice a day as a starting point to see if it is helpful. Also, on her med list is Baclofen. I believe she mentioned it made her too drowsy.

## 2019-01-29 NOTE — Telephone Encounter (Signed)
LMVM for pt to return call for lab results.  

## 2019-01-30 NOTE — Progress Notes (Signed)
This encounter was created in error - please disregard.

## 2019-01-30 NOTE — Telephone Encounter (Signed)
I called pt and relayed that her lab results back.  SS/NP wanted her to know that her CK level elevated, this being related to muscle spasms/parethesias.  Recommend [place]/EMG and gabapentin medication.  Please call back to discuss.

## 2019-02-03 NOTE — Telephone Encounter (Signed)
Pt called and scheduled EMG/NCS dutdy for 03/11/19 at 245 pm with Dr. Jannifer Franklin.

## 2019-02-03 NOTE — Telephone Encounter (Signed)
Sent mychart message

## 2019-02-03 NOTE — Telephone Encounter (Signed)
Noted  

## 2019-02-04 ENCOUNTER — Telehealth: Payer: Self-pay | Admitting: Family Medicine

## 2019-02-04 NOTE — Telephone Encounter (Signed)
Pt would like Dr. Pamella Pert to know her CK levels are elevated she found out from her neurologist. She is having pain and numbness in her hands, legs and feet. She was wondering if we could work her into

## 2019-02-20 ENCOUNTER — Ambulatory Visit (INDEPENDENT_AMBULATORY_CARE_PROVIDER_SITE_OTHER): Payer: Managed Care, Other (non HMO) | Admitting: Family Medicine

## 2019-02-20 ENCOUNTER — Other Ambulatory Visit: Payer: Self-pay

## 2019-02-20 ENCOUNTER — Encounter: Payer: Self-pay | Admitting: Family Medicine

## 2019-02-20 VITALS — BP 128/88 | HR 96 | Temp 99.2°F | Ht 64.0 in | Wt 156.2 lb

## 2019-02-20 DIAGNOSIS — M79601 Pain in right arm: Secondary | ICD-10-CM

## 2019-02-20 DIAGNOSIS — I10 Essential (primary) hypertension: Secondary | ICD-10-CM | POA: Diagnosis not present

## 2019-02-20 DIAGNOSIS — E1165 Type 2 diabetes mellitus with hyperglycemia: Secondary | ICD-10-CM | POA: Diagnosis not present

## 2019-02-20 DIAGNOSIS — M79602 Pain in left arm: Secondary | ICD-10-CM

## 2019-02-20 DIAGNOSIS — R202 Paresthesia of skin: Secondary | ICD-10-CM | POA: Diagnosis not present

## 2019-02-20 HISTORY — DX: Type 2 diabetes mellitus with hyperglycemia: E11.65

## 2019-02-20 HISTORY — DX: Essential (primary) hypertension: I10

## 2019-02-20 LAB — POCT GLYCOSYLATED HEMOGLOBIN (HGB A1C): Hemoglobin A1C: 6.7 % — AB (ref 4.0–5.6)

## 2019-02-20 MED ORDER — PIOGLITAZONE HCL 30 MG PO TABS: 30.0000 mg | ORAL_TABLET | Freq: Every day | ORAL | 2 refills | Status: DC

## 2019-02-20 MED ORDER — GABAPENTIN 100 MG PO CAPS: 200.0000 mg | ORAL_CAPSULE | Freq: Three times a day (TID) | ORAL | 2 refills | Status: DC

## 2019-02-20 NOTE — Patient Instructions (Signed)
° ° ° °  If you have lab work done today you will be contacted with your lab results within the next 2 weeks.  If you have not heard from us then please contact us. The fastest way to get your results is to register for My Chart. ° ° °IF you received an x-ray today, you will receive an invoice from [facility]. Please contact [facility] at [phone] with questions or concerns regarding your invoice.  ° °IF you received labwork today, you will receive an invoice from [facility]. Please contact [facility] at [phone] with questions or concerns regarding your invoice.  ° °Our billing staff will not be able to assist you with questions regarding bills from these companies. ° °You will be contacted with the lab results as soon as they are available. The fastest way to get your results is to activate your My Chart account. Instructions are located on the last page of this paperwork. If you have not heard from us regarding the results in 2 weeks, please contact this office. °  ° ° ° °

## 2019-02-20 NOTE — Progress Notes (Signed)
7/23/20208:15 AM  Sandra Moody 10-06-1970, 48 y.o., female [id number]  Chief Complaint  Patient presents with  . Diabetes    f/u    HPI:   Patient is a 48 y.o. female with past medical history significant for HTN, migraines, asthma, GERD, biliary dyskinesia,who presents today for followup  Last OV April Referred to hand surg for trigger finger - had steroid injection right hand, placed in splint - states that did not really help, caused lots of burning, cbgs increased afterwards Stopped glipizide due to hypoglycemia - sign improved Has seen CDE x 1, 2 carbs per meal Has seen neurology - mild elevation of CK, plan for EMG, started gabapentin?  Usually checks fasting and bedtime 7 day overall avg 124, total 15 tests Review of log book for past 14 days: Fasting range: 85 - 136 Bedtime range: 97-239. Avg: 156 Feels sx of hypoglycemia < 100: 2 events CBGs were higher in May, have been coming down since then Following CDE diet recommendations wo difficulty Does not tend to skip meals, eats about every 3-4 horus Having significant diarrhea with metformin, > 5 x day Has been doing 543m once a day for about the past month Did not receive rx for gabapentin - has constant burning and tingling pain in her hands Wondering about CGM as she does not like to test due to pain  Lab Results  Component Value Date   HGBA1C 10.3 (H) 10/01/2018   Lab Results  Component Value Date   LDLCALC 84 11/18/2018   CREATININE 1.08 (H) 01/28/2019    Depression screen PHQ 2/9 02/20/2019 01/28/2019 12/25/2018  Decreased Interest 0 0 0  Down, Depressed, Hopeless 0 0 0  PHQ - 2 Score 0 0 0    Fall Risk  02/20/2019 01/28/2019 12/25/2018 11/18/2018 11/07/2018  Falls in the past year? 0 0 0 0 0  Number falls in past yr: 0 - - 0 0  Injury with Fall? 0 - - 0 0  Follow up Falls evaluation completed - - - Falls evaluation completed     Allergies  Allergen Reactions  . Ginger Itching and Swelling  .  Passion Fruit Flavor [Flavoring Agent] Itching and Swelling  . Peanuts [Peanut Oil] Anaphylaxis    PT ALLERGIC TO ALL TREE NUTS  . Shellfish Allergy Anaphylaxis  . Tamiflu [Oseltamivir Phosphate]     Prior to Admission medications   Medication Sig Start Date End Date Taking? Authorizing Provider  ACCU-CHEK GUIDE test strip USE TO CHECK GLUCOSE TWICE DAILY,FASTING AND 2 HOURS AFTER DINNER 10/30/18  Yes [provider]  ALBUTEROL SULFATE PO Take by mouth.   Yes [provider]  amLODipine (NORVASC) 2.5 MG tablet TAKE 1 TABLET BY MOUTH EVERY DAY 01/23/19  Yes SRutherford Guys MD  baclofen (LIORESAL) 10 MG tablet Take 10 mg by mouth 3 (three) times daily.   Yes [provider]  blood glucose meter kit and supplies Per insurance preference. Use to check glucose twice a day (fasting and 2 hours after dinner)  Dx E11.65 10/30/18  Yes SRutherford Guys MD  EPIPEN 2-PAK 0.3 MG/0.3ML SOAJ injection Inject 0.3 mg into the muscle as needed (for allergic reaction).  05/18/16  Yes [provider]  fluticasone (FLONASE) 50 MCG/ACT nasal spray USE 1 2 SPRAYS IN EACH NOSTRIL ONCE A DAY 10/25/18  Yes [provider]  imipramine (TOFRANIL-PM) 100 MG capsule Take 100 mg by mouth at bedtime.   Yes [provider]  levocetirizine (  XYZAL) 5 MG tablet 1 2 TABLET IN THE EVENING ONCE A DAY ORALLY 15 11/05/18  Yes [provider]  medroxyPROGESTERone (DEPO-PROVERA) 150 MG/ML injection  08/15/18  Yes [provider]  metFORMIN (GLUCOPHAGE) 500 MG tablet TAKE 1 TABLET (500 MG TOTAL) BY MOUTH 2 (TWO) TIMES DAILY WITH A MEAL. 01/20/19  Yes Rutherford Guys, MD  montelukast (SINGULAIR) 10 MG tablet Take 10 mg by mouth at bedtime.   Yes [provider]  olopatadine (PATANOL) 0.1 % ophthalmic solution Place 1 drop into both eyes 2 (two) times daily. 12/25/18  Yes Corum, Rex Kras, MD  rizatriptan (MAXALT) 10 MG tablet Take 1 tablet (10 mg total) by mouth 3  (three) times daily as needed for migraine. 08/29/18  Yes Kathrynn Ducking, MD  SYMBICORT 160-4.5 MCG/ACT inhaler INHALE 2 PUFFS INTO THE LUNGS DAILY. 05/09/18  Yes Valentina Shaggy, MD    Past Medical History:  Diagnosis Date  . Acute renal disease   . Asthma   . Common migraine with intractable migraine 08/29/2018  . Daily headache    (03/08/2017)  . GERD (gastroesophageal reflux disease)   . H pylori ulcer ~ 08/2016  . History of hiatal hernia   . Leaky heart valve ~ 09/2016  . Migraine    "maybe weekly" (03/08/2017)  . PONV (postoperative nausea and vomiting)     Past Surgical History:  Procedure Laterality Date  . CHOLECYSTECTOMY N/A 03/08/2017   Procedure: LAPAROSCOPIC CHOLECYSTECTOMY;  Surgeon: Coralie Keens, MD;  Location: [facility];  Service: General;  Laterality: N/A;  . DILATION AND CURETTAGE OF UTERUS    . LAPAROSCOPIC CHOLECYSTECTOMY  03/08/2017  . TUBAL LIGATION      Social History   Tobacco Use  . Smoking status: Never Smoker  . Smokeless tobacco: Never Used  Substance Use Topics  . Alcohol use: Yes    Comment: 03/08/2017 "might have a drink on a holiday"    Family History  Problem Relation Age of Onset  . Thyroid disease Mother   . Diabetes Maternal Grandmother   . Heart disease Maternal Grandfather     ROS Per hpi  OBJECTIVE:  Today's Vitals   02/20/19 0804 02/20/19 0811  BP: (!) 134/93 128/88  Pulse: 96   Temp: 99.2 F (37.3 C)   TempSrc: Oral   SpO2: 97%   Weight: 156 lb 3.2 oz (70.9 kg)   Height: _0  (1.626 m)    Body mass index is 26.81 kg/m.   Physical Exam Vitals signs and nursing note reviewed.  Constitutional:      Appearance: She is well-developed.  HENT:     Head: Normocephalic and atraumatic.  Eyes:     General: No scleral icterus.    Conjunctiva/sclera: Conjunctivae normal.     Pupils: Pupils are equal, round, and reactive to light.  Neck:     Musculoskeletal: Neck supple.  Pulmonary:     Effort: Pulmonary effort  is normal.  Skin:    General: Skin is warm and dry.  Neurological:     Mental Status: She is alert and oriented to person, place, and time.     Results for orders placed or performed in visit on 02/20/19 (from the past 24 hour(s))  POCT A1C     Status: Abnormal   Collection Time: 02/20/19  8:46 AM  Result Value Ref Range   Hemoglobin A1C 6.7 (A) 4.0 - 5.6 %   HbA1c POC (<> result, manual entry)  HbA1c, POC (prediabetic range)     HbA1c, POC (controlled diabetic range)      No results found.   ASSESSMENT and PLAN  1. Type 2 diabetes mellitus with hyperglycemia, without long-term current use of insulin (HCC) a1c at goal. Intolerant of metformin due to GI sx, ER on recall. Discussed treatment options. Trial of actos, reviewed r/se/b. Continue checking CBGs, if continues to be not have sign variability and be at goal, can decrease to once a day testing.  - POCT A1C  2. Paresthesia and pain of both upper extremities Scheduled for EMG, starting gabapentin, reviewed r/se/b  3. Essential hypertension Controlled. Continue current regime.   Other orders - gabapentin (NEURONTIN) 100 MG capsule; Take 2 capsules (200 mg total) by mouth 3 (three) times daily. - pioglitazone (ACTOS) 30 MG tablet; Take 1 tablet (30 mg total) by mouth daily.  Return in about 3 months (around 05/23/2019).    Rutherford Guys, MD [facility] [place], [place] [address] Ph.  [phone] Fax [phone]

## 2019-03-11 ENCOUNTER — Ambulatory Visit (INDEPENDENT_AMBULATORY_CARE_PROVIDER_SITE_OTHER): Payer: Managed Care, Other (non HMO) | Admitting: Neurology

## 2019-03-11 ENCOUNTER — Encounter: Payer: Self-pay | Admitting: Neurology

## 2019-03-11 ENCOUNTER — Other Ambulatory Visit: Payer: Self-pay

## 2019-03-11 DIAGNOSIS — R202 Paresthesia of skin: Secondary | ICD-10-CM | POA: Diagnosis not present

## 2019-03-11 DIAGNOSIS — G5603 Carpal tunnel syndrome, bilateral upper limbs: Secondary | ICD-10-CM | POA: Insufficient documentation

## 2019-03-11 HISTORY — DX: Carpal tunnel syndrome, bilateral upper limbs: G56.03

## 2019-03-11 NOTE — Procedures (Signed)
     HISTORY:  Sandra Moody is a 48 year old patient with a history of diabetes and migraine headaches.  The patient has developed numbness and discomfort in the hands on both sides, she also has some low back pain and a sensation that the left leg will collapse on her.  She is being evaluated for a possible neuropathy or a radiculopathy.  NERVE CONDUCTION STUDIES:  Nerve conduction studies were performed on both upper extremities.  The distal motor latencies for the median nerves were prolonged bilaterally with normal motor amplitudes seen on the left, low on the right.  The distal motor latencies and motor amplitudes for the left ulnar nerve were normal.  Nerve conduction velocities for the median nerves bilaterally and for the left ulnar nerve were normal.  The F-wave latency for the left ulnar nerve was normal.  Nerve conduction studies were performed on the left lower extremity. The distal motor latencies and motor amplitudes for the peroneal and posterior tibial nerves were within normal limits. The nerve conduction velocities for these nerves were also normal, with exception of a slight slowing for the right peroneal nerve above the knee only. The sensory latencies for the peroneal and sural nerves were within normal limits. The F wave latency for the posterior tibial nerve was within normal limits.   EMG STUDIES:  EMG study was performed on the left lower extremity:  The tibialis anterior muscle reveals 2 to 4K motor units with full recruitment. No fibrillations or positive waves were seen. The peroneus tertius muscle reveals 2 to 4K motor units with full recruitment. No fibrillations or positive waves were seen. The medial gastrocnemius muscle reveals 1 to 3K motor units with full recruitment. No fibrillations or positive waves were seen. The vastus lateralis muscle reveals 2 to 4K motor units with full recruitment. No fibrillations or positive waves were seen. The iliopsoas muscle  reveals 2 to 4K motor units with full recruitment. No fibrillations or positive waves were seen. The biceps femoris muscle (long head) reveals 2 to 4K motor units with full recruitment. No fibrillations or positive waves were seen. The lumbosacral paraspinal muscles were tested at 3 levels, and revealed no abnormalities of insertional activity at all 3 levels tested. There was good relaxation.   IMPRESSION:  Nerve conduction studies done on both upper extremities shows evidence of bilateral carpal tunnel syndrome of mild severity on the left and moderate severity on the right.  Nerve conduction studies on the left lower extremity were relatively unremarkable, no evidence of a generalized peripheral neuropathy was seen.  The EMG study involving the left lower extremity was unremarkable, no evidence of an overlying lumbosacral radiculopathy was noted.   Jill Alexanders MD 03/11/2019 3:53 PM  [facility] [address] [place], [place] [address]  Phone [phone] Fax [phone]

## 2019-03-11 NOTE — Progress Notes (Signed)
Please refer to EMG and nerve conduction procedure note.  

## 2019-03-11 NOTE — Progress Notes (Signed)
The patient comes in today for EMG nerve conduction study evaluation.  Nerve conductions show evidence of bilateral carpal tunnel syndrome of mild severity on the left, moderate severity on the right.  She also complains of some problems with feeling weak of the left leg and low back pain, EMG and nerve conduction studies on the left leg were normal.  The patient apparently is already seeing a hand surgeon for trigger finger problems, she may need to go back to that physician for the carpal tunnel syndrome.  She will try wrist splints on both hands for the next 8 weeks, if no benefit, surgery may be indicated.

## 2019-03-18 ENCOUNTER — Ambulatory Visit: Payer: Managed Care, Other (non HMO) | Admitting: Orthopaedic Surgery

## 2019-03-19 ENCOUNTER — Ambulatory Visit (INDEPENDENT_AMBULATORY_CARE_PROVIDER_SITE_OTHER): Payer: Managed Care, Other (non HMO) | Admitting: Orthopaedic Surgery

## 2019-03-19 ENCOUNTER — Other Ambulatory Visit: Payer: Self-pay

## 2019-03-19 ENCOUNTER — Encounter: Payer: Self-pay | Admitting: Orthopaedic Surgery

## 2019-03-19 VITALS — BP 126/82 | HR 100 | Ht 64.0 in | Wt 156.0 lb

## 2019-03-19 DIAGNOSIS — G5603 Carpal tunnel syndrome, bilateral upper limbs: Secondary | ICD-10-CM | POA: Diagnosis not present

## 2019-03-19 NOTE — Progress Notes (Signed)
Office Visit Note   Patient: Sandra Moody           Date of Birth: 01/14/1971           MRN: [id number] Visit Date: 03/19/2019              Requested by: Myles LippsSantiago, Irma M, MD [address] [place],  [place] [id number] PCP: Myles LippsSantiago, Irma M, MD   Assessment & Plan: Visit Diagnoses:  1. Carpal tunnel syndrome, bilateral     Plan: Mrs. Sandra Moody has had EMGs and [facility] conduction [facility].  She has evidence of bilateral carpal tunnel syndrome.  The studies indicate moderately severe on the right and mild on the left.  She is symptomatic on the right.  Long discussion regarding use of splints and trying Voltaren gel.  Even discuss surgery, time out of work and what she may expect.  She like to consider surgery sometime in December.  If she still continues to have problem with any of her trigger fingers we could consider release of that at the same time  Follow-Up Instructions: Return if symptoms worsen or fail to improve.   Orders:  No orders of the defined types were placed in this encounter.  No orders of the defined types were placed in this encounter.     Procedures: No procedures performed   Clinical Data: No additional findings.   Subjective: Chief Complaint  Patient presents with  . Left Hand - Pain  . Right Hand - Pain  Patient presents today with bilateral hand pain X 1 year. No known injury. She said that her palms and the tips of all her fingers are painful. She said that they hurt all the time, but worse after a lot of use. She said that they get weak after prolonged use. She saw her neurologist Dr.Willis and had an EMG done on 03/11/2019. She was told she had bilateral carpal tunnel. She feels like her right hand is worse. She is right hand dominant. She has tried Gabapentin, but states that it does not seem to be helping.  Results of the study will be addended to the chart.  [facility] and indicate moderately severe carpal tunnel on the right and mild on the left.  Mrs. Sandra Moody is symptomatic on the right.  She has had numbness and tingling and pain.  She is worn a splint that seems to make a difference but she cannot wear it when she works.  Still having some trouble with the trigger fingers that diagnosed previously  HPI  Review of Systems   Objective: Vital Signs: BP 126/82   Pulse 100   Ht 5\' 4"  (1.626 m)   Wt 156 lb (70.8 kg)   BMI 26.78 kg/m   Physical Exam Constitutional:      Appearance: She is well-developed.  Eyes:     Pupils: Pupils are equal, round, and reactive to light.  Pulmonary:     Effort: Pulmonary effort is normal.  Skin:    General: Skin is warm and dry.  Neurological:     Mental Status: She is alert and oriented to person, place, and time.  Psychiatric:        Behavior: Behavior normal.     Ortho Exam awake alert and oriented x3.  Comfortable sitting positive Tinel's over the median nerve at the right wrist.  Good grip and release.  Able to oppose thumb to little finger with  good strength.  Does have some tingling at the tips of all of her fingers more so the radial 3.  Nerve conduction studies did not indicate any abnormality with the ulnar nerves are evidence of her neuropathy.  Could not actively trigger any of the fingers in either hand today  Specialty Comments:  No specialty comments available.  Imaging: No results found.   PMFS History: Patient Active Problem List   Diagnosis Date Noted  . Carpal tunnel syndrome, bilateral 03/11/2019  . Essential hypertension 02/20/2019  . Type 2 diabetes mellitus with hyperglycemia, without long-term current use of insulin ([place]) 02/20/2019  . Other acute sinusitis 12/25/2018  . Common migraine with intractable migraine 08/29/2018  . Moderate persistent asthma without complication 83/25/4982  . Seasonal and perennial allergic rhinitis 06/28/2017  . Mild persistent asthma with  acute exacerbation 06/28/2017  . Gastroesophageal reflux disease 06/28/2017  . Biliary dyskinesia 03/08/2017  . Asthma 03/02/2016  . Asthma with acute exacerbation 10/04/2015  . Other and unspecified ovarian cyst 06/17/2012   Past Medical History:  Diagnosis Date  . Acute renal disease   . Asthma   . Bilateral carpal tunnel syndrome 03/11/2019  . Common migraine with intractable migraine 08/29/2018  . Daily headache    (03/08/2017)  . Essential hypertension 02/20/2019  . GERD (gastroesophageal reflux disease)   . H pylori ulcer ~ 08/2016  . History of hiatal hernia   . Leaky heart valve ~ 09/2016  . Migraine    "maybe weekly" (03/08/2017)  . PONV (postoperative nausea and vomiting)   . Type 2 diabetes mellitus with hyperglycemia, without long-term current use of insulin ([place]) 02/20/2019    Family History  Problem Relation Age of Onset  . Thyroid disease Mother   . Diabetes Maternal Grandmother   . Heart disease Maternal Grandfather     Past Surgical History:  Procedure Laterality Date  . CHOLECYSTECTOMY N/A 03/08/2017   Procedure: LAPAROSCOPIC CHOLECYSTECTOMY;  Surgeon: Coralie Keens, MD;  Location: [place];  Service: General;  Laterality: N/A;  . DILATION AND CURETTAGE OF UTERUS    . LAPAROSCOPIC CHOLECYSTECTOMY  03/08/2017  . TUBAL LIGATION     Social History   Occupational History    Comment: [employer]   Tobacco Use  . Smoking status: Never Smoker  . Smokeless tobacco: Never Used  Substance and Sexual Activity  . Alcohol use: Yes    Comment: 03/08/2017 "might have a drink on a holiday"  . Drug use: No  . Sexual activity: Yes    Partners: Male    Birth control/protection: Injection

## 2019-04-14 ENCOUNTER — Other Ambulatory Visit: Payer: Self-pay

## 2019-04-14 ENCOUNTER — Ambulatory Visit (HOSPITAL_COMMUNITY)
Admission: EM | Admit: 2019-04-14 | Discharge: 2019-04-14 | Disposition: A | Discharge status: Home / Self Care | Payer: Managed Care, Other (non HMO) | Attending: Family Medicine | Admitting: Family Medicine

## 2019-04-14 ENCOUNTER — Encounter (HOSPITAL_COMMUNITY): Payer: Self-pay

## 2019-04-14 ENCOUNTER — Ambulatory Visit (INDEPENDENT_AMBULATORY_CARE_PROVIDER_SITE_OTHER): Payer: Managed Care, Other (non HMO)

## 2019-04-14 ENCOUNTER — Ambulatory Visit (HOSPITAL_COMMUNITY): Payer: Managed Care, Other (non HMO)

## 2019-04-14 DIAGNOSIS — G8929 Other chronic pain: Secondary | ICD-10-CM

## 2019-04-14 DIAGNOSIS — M79645 Pain in left finger(s): Secondary | ICD-10-CM

## 2019-04-14 LAB — CBC
HCT: 41.8 % (ref 36.0–46.0)
Hemoglobin: 13.4 g/dL (ref 12.0–15.0)
MCH: 31.2 pg (ref 26.0–34.0)
MCHC: 32.1 g/dL (ref 30.0–36.0)
MCV: 97.2 fL (ref 80.0–100.0)
Platelets: 387 10*3/uL (ref 150–400)
RBC: 4.3 MIL/uL (ref 3.87–5.11)
RDW: 13.2 % (ref 11.5–15.5)
WBC: 6.9 10*3/uL (ref 4.0–10.5)
nRBC: 0 % (ref 0.0–0.2)

## 2019-04-14 LAB — C-REACTIVE PROTEIN: CRP: <0.8 mg/dL (ref <1.0)

## 2019-04-14 LAB — SEDIMENTATION RATE: Sed Rate: 14 mm/hr (ref 0–22)

## 2019-04-14 MED ORDER — CEFTRIAXONE SODIUM 1 G IJ SOLR
INTRAMUSCULAR | Status: AC
  Filled 2019-04-14: qty 10

## 2019-04-14 MED ORDER — CEFTRIAXONE SODIUM 1 G IJ SOLR
1.0000 g | Freq: Once | INTRAMUSCULAR | Status: AC
  Administered 2019-04-14: 1 g via INTRAMUSCULAR

## 2019-04-14 NOTE — ED Provider Notes (Signed)
[facility]    CSN: [id number] Arrival date & time: 04/14/19  1738      History   Chief Complaint Chief Complaint  Patient presents with  . Thumb Pain    HPI Sandra Moody is a 48 y.o. female.   She is presenting with acute left thumb pain.  This is occurring over the palmar aspect.  The pain is gotten severe and constant.  She is unable to move her thumb without significant pain.  She denies any specific inciting event.  Has not had any improvement with conservative measures.  Has not had any fevers or chills.  Pain is extending from the tip of the thumb to the base of the thumb.  Has some redness over the thumb itself.  No prior history of surgery.  HPI  Past Medical History:  Diagnosis Date  . Acute renal disease   . Asthma   . Bilateral carpal tunnel syndrome 03/11/2019  . Common migraine with intractable migraine 08/29/2018  . Daily headache    (03/08/2017)  . Essential hypertension 02/20/2019  . GERD (gastroesophageal reflux disease)   . H pylori ulcer ~ 08/2016  . History of hiatal hernia   . Leaky heart valve ~ 09/2016  . Migraine    "maybe weekly" (03/08/2017)  . PONV (postoperative nausea and vomiting)   . Type 2 diabetes mellitus with hyperglycemia, without long-term current use of insulin ([place]) 02/20/2019    Patient Active Problem List   Diagnosis Date Noted  . Carpal tunnel syndrome, bilateral 03/11/2019  . Essential hypertension 02/20/2019  . Type 2 diabetes mellitus with hyperglycemia, without long-term current use of insulin ([place]) 02/20/2019  . Other acute sinusitis 12/25/2018  . Common migraine with intractable migraine 08/29/2018  . Moderate persistent asthma without complication 38/25/0539  . Seasonal and perennial allergic rhinitis 06/28/2017  . Mild persistent asthma with acute exacerbation 06/28/2017  . Gastroesophageal reflux disease 06/28/2017  . Biliary dyskinesia 03/08/2017  . Asthma 03/02/2016  . Asthma with acute exacerbation  10/04/2015  . Other and unspecified ovarian cyst 06/17/2012    Past Surgical History:  Procedure Laterality Date  . CHOLECYSTECTOMY N/A 03/08/2017   Procedure: LAPAROSCOPIC CHOLECYSTECTOMY;  Surgeon: Coralie Keens, MD;  Location: [facility];  Service: General;  Laterality: N/A;  . DILATION AND CURETTAGE OF UTERUS    . LAPAROSCOPIC CHOLECYSTECTOMY  03/08/2017  . TUBAL LIGATION      OB History    Gravida  5   Para  3   Term  3   Preterm  0   AB  2   Living        SAB  1   TAB  1   Ectopic  0   Multiple      Live Births               Home Medications    Prior to Admission medications   Medication Sig Start Date End Date Taking? Authorizing Provider  ACCU-CHEK GUIDE test strip USE TO CHECK GLUCOSE TWICE DAILY,FASTING AND 2 HOURS AFTER DINNER 10/30/18   [provider]  ALBUTEROL SULFATE PO Take by mouth.    [provider]  amLODipine (NORVASC) 2.5 MG tablet TAKE 1 TABLET BY MOUTH EVERY DAY 01/23/19   Rutherford Guys, MD  baclofen (LIORESAL) 10 MG tablet Take 10 mg by mouth 3 (three) times daily.    [provider]  blood glucose meter kit and supplies Per insurance preference. Use to check glucose  twice a day (fasting and 2 hours after dinner)  Dx E11.65 10/30/18   Rutherford Guys, MD  EPIPEN 2-PAK 0.3 MG/0.3ML SOAJ injection Inject 0.3 mg into the muscle as needed (for allergic reaction).  05/18/16   [provider]  fluticasone (FLONASE) 50 MCG/ACT nasal spray USE 1 2 SPRAYS IN EACH NOSTRIL ONCE A DAY 10/25/18   [provider]  gabapentin (NEURONTIN) 100 MG capsule Take 2 capsules (200 mg total) by mouth 3 (three) times daily. 02/20/19 03/22/19  Rutherford Guys, MD  imipramine (TOFRANIL-PM) 100 MG capsule Take 100 mg by mouth at bedtime.    [provider]  levocetirizine (XYZAL) 5 MG tablet 1 2 TABLET IN THE EVENING ONCE A DAY ORALLY 15 11/05/18   [provider]  medroxyPROGESTERone (DEPO-PROVERA) 150  MG/ML injection  08/15/18   [provider]  montelukast (SINGULAIR) 10 MG tablet Take 10 mg by mouth at bedtime.    [provider]  olopatadine (PATANOL) 0.1 % ophthalmic solution Place 1 drop into both eyes 2 (two) times daily. 12/25/18   Corum, Rex Kras, MD  pioglitazone (ACTOS) 30 MG tablet Take 1 tablet (30 mg total) by mouth daily. 02/20/19   Rutherford Guys, MD  rizatriptan (MAXALT) 10 MG tablet Take 1 tablet (10 mg total) by mouth 3 (three) times daily as needed for migraine. 08/29/18   Kathrynn Ducking, MD  SYMBICORT 160-4.5 MCG/ACT inhaler INHALE 2 PUFFS INTO THE LUNGS DAILY. 05/09/18   Valentina Shaggy, MD    Family History Family History  Problem Relation Age of Onset  . Thyroid disease Mother   . Diabetes Maternal Grandmother   . Heart disease Maternal Grandfather     Social History Social History   Tobacco Use  . Smoking status: Never Smoker  . Smokeless tobacco: Never Used  Substance Use Topics  . Alcohol use: Yes    Comment: 03/08/2017 "might have a drink on a holiday"  . Drug use: No     Allergies   Ginger, Passion fruit flavor [flavoring agent], Peanuts [peanut oil], Shellfish allergy, and Tamiflu [oseltamivir phosphate]   Review of Systems Review of Systems  Constitutional: Negative for fever.  HENT: Negative for congestion.   Respiratory: Negative for cough.   Cardiovascular: Negative for chest pain.  Gastrointestinal: Negative for abdominal pain.  Musculoskeletal: Positive for joint swelling.  Skin: Positive for color change.  Neurological: Negative for weakness.  Hematological: Negative for adenopathy.     Physical Exam Triage Vital Signs ED Triage Vitals  Enc Vitals Group     BP 04/14/19 1759 137/89     Pulse Rate 04/14/19 1759 88     Resp 04/14/19 1759 16     Temp 04/14/19 1759 98.1 F (36.7 C)     Temp Source 04/14/19 1759 Temporal     SpO2 04/14/19 1759 98 %     Weight --      Height --      Head Circumference --       Peak Flow --      Pain Score 04/14/19 1802 8     Pain Loc --      Pain Edu? --      Excl. in [place]? --    No data found.  Updated Vital Signs BP 137/89 (BP Location: Right Arm)   Pulse 88   Temp 98.1 F (36.7 C) (Temporal)   Resp 16   SpO2 98%   Visual Acuity Right Eye Distance:  Left Eye Distance:   Bilateral Distance:    Right Eye Near:   Left Eye Near:    Bilateral Near:     Physical Exam Gen: NAD, alert, cooperative with exam, well-appearing ENT: normal lips, normal nasal mucosa,  Eye: normal EOM, normal conjunctiva and lids CV:  no edema, +2 pedal pulses   Resp: no accessory muscle use, non-labored,  Skin: no rashes, no areas of induration  Neuro: normal tone, normal sensation to touch Psych:  normal insight, alert and oriented MSK:  Left hand: Obvious swelling of the thumb itself. Some streaking proximally on the palmar aspect. Significant tenderness with any movement or palpation of the CMC, MP or IP joint. Limited range of motion secondary to pain of the thumb. Normal finger range of motion. Normal strength resistance with finger abduction abduction. Neurovascular intact   UC Treatments / Results  Labs (all labs ordered are listed, but only abnormal results are displayed) Labs Reviewed  CBC  C-REACTIVE PROTEIN  SEDIMENTATION RATE    EKG   Radiology Dg Hand Complete Left  Result Date: 04/14/2019 CLINICAL DATA:  Left thumb infection and pain for 3 days. No reported injury. EXAM: LEFT HAND - COMPLETE 3+ VIEW COMPARISON:  None. FINDINGS: No fracture or dislocation. No osseous erosions or periosteal reaction. No evidence of soft tissue gas. No suspicious focal osseous lesions. No significant arthropathy. No radiopaque foreign body. IMPRESSION: No acute osseous abnormality. Electronically Signed   By: Ilona Sorrel M.D.   On: 04/14/2019 19:50    Procedures Procedures (including critical care time)  Medications Ordered in UC Medications   cefTRIAXone (ROCEPHIN) injection 1 g (1 g Intramuscular Given 04/14/19 2019)  cefTRIAXone (ROCEPHIN) 1 g injection (has no administration in time range)    Initial Impression / Assessment and Plan / UC Course  I have reviewed the triage vital signs and the nursing notes.  Pertinent labs & imaging results that were available during my care of the patient were reviewed by me and considered in my medical decision making (see chart for details).     Ariyana is a 48 year old female is presenting with acute left thumb pain.  There is concern for infection given the significant pain with movement and no inciting event.  Does have some redness.  X-ray was unrevealing.  Will collect a CBC, sed rate and CRP.  Placed in a thumb spica.  Given a gram of ceftriaxone.  Given indications for follow-up.  Final Clinical Impressions(s) / UC Diagnoses   Final diagnoses:  Chronic pain of left thumb     Discharge Instructions     Please wear the thumb spice splint  We will call you with the results.  Please take ibuprofen for pain.  Please follow up.     ED Prescriptions    None     Controlled Substance Prescriptions  Controlled Substance Registry consulted? Not Applicable   Rosemarie Ax, MD 04/14/19 2122

## 2019-04-14 NOTE — Discharge Instructions (Signed)
Please wear the thumb spice splint  We will call you with the results.  Please take ibuprofen for pain.  Please follow up.

## 2019-04-14 NOTE — ED Triage Notes (Signed)
Pt presents with left thumb swelling and inflammation.

## 2019-04-15 ENCOUNTER — Ambulatory Visit: Payer: Self-pay

## 2019-04-15 ENCOUNTER — Ambulatory Visit (INDEPENDENT_AMBULATORY_CARE_PROVIDER_SITE_OTHER): Payer: Managed Care, Other (non HMO) | Admitting: Family Medicine

## 2019-04-15 ENCOUNTER — Encounter: Payer: Self-pay | Admitting: Family Medicine

## 2019-04-15 VITALS — BP 138/88 | Ht 64.0 in | Wt 157.0 lb

## 2019-04-15 DIAGNOSIS — M79645 Pain in left finger(s): Secondary | ICD-10-CM

## 2019-04-15 DIAGNOSIS — M6510 Other infective (teno)synovitis, unspecified site: Secondary | ICD-10-CM | POA: Insufficient documentation

## 2019-04-15 MED ORDER — SULFAMETHOXAZOLE-TRIMETHOPRIM 800-160 MG PO TABS: 1.0000 | ORAL_TABLET | Freq: Two times a day (BID) | ORAL | 0 refills | Status: DC

## 2019-04-15 NOTE — Assessment & Plan Note (Signed)
Findings suggestive of a infected flexor tenosynovitis.  She was given a gram of ceftriaxone last night at the urgent care and has had improvement of her symptoms.  She is still using a thumb spica.  Spoke with Dr. Lenon Curt about the case.  Can consider oral antibiotics or seen in the emergency department. -Provided with Bactrim.  Counseled with the patient discussed her options.  She will plan on going to the emergency department tomorrow. -Continue thumb spica Velcro splint. -Given indications to seek more immediate care.

## 2019-04-15 NOTE — Progress Notes (Signed)
Sandra Moody - 48 y.o. female MRN [id number]  Date of birth: July 28, 1971  SUBJECTIVE:  Including CC & ROS.  Chief Complaint  Patient presents with  . Hand Pain    Sandra Moody is a 48 y.o. female that is presenting with left thumb pain.  She has had redness and swelling ongoing for the past 4 or 5 days.  Extremely tender to touch and significant pain with any movement.  She denies any inciting event or trauma.  It is occurring on the flexor aspect of the thumb.  No history of similar symptoms.  No fever or chills.  The pain is becoming more constant in nature.  She has not taken anything for the pain..  Independent review of the lab work from 9/14 shows a normal white count and no elevated CRP or sed rate.   Review of Systems  Constitutional: Negative for fever.  HENT: Negative for congestion.   Respiratory: Negative for cough.   Cardiovascular: Negative for chest pain.  Gastrointestinal: Negative for abdominal pain.  Musculoskeletal: Negative for gait problem.  Skin: Positive for color change.  Neurological: Negative for tremors.  Hematological: Negative for adenopathy.    HISTORY: Past Medical, Surgical, Social, and Family History Reviewed & Updated per EMR.   Pertinent Historical Findings include:  Past Medical History:  Diagnosis Date  . Acute renal disease   . Asthma   . Bilateral carpal tunnel syndrome 03/11/2019  . Common migraine with intractable migraine 08/29/2018  . Daily headache    (03/08/2017)  . Essential hypertension 02/20/2019  . GERD (gastroesophageal reflux disease)   . H pylori ulcer ~ 08/2016  . History of hiatal hernia   . Leaky heart valve ~ 09/2016  . Migraine    "maybe weekly" (03/08/2017)  . PONV (postoperative nausea and vomiting)   . Type 2 diabetes mellitus with hyperglycemia, without long-term current use of insulin ([facility]) 02/20/2019    Past Surgical History:  Procedure Laterality Date  . CHOLECYSTECTOMY N/A 03/08/2017   Procedure: LAPAROSCOPIC  CHOLECYSTECTOMY;  Surgeon: Coralie Keens, MD;  Location: [facility];  Service: General;  Laterality: N/A;  . DILATION AND CURETTAGE OF UTERUS    . LAPAROSCOPIC CHOLECYSTECTOMY  03/08/2017  . TUBAL LIGATION      Allergies  Allergen Reactions  . Ginger Itching and Swelling  . Passion Fruit Flavor [Flavoring Agent] Itching and Swelling  . Peanuts [Peanut Oil] Anaphylaxis    PT ALLERGIC TO ALL TREE NUTS  . Shellfish Allergy Anaphylaxis  . Tamiflu [Oseltamivir Phosphate]     Family History  Problem Relation Age of Onset  . Thyroid disease Mother   . Diabetes Maternal Grandmother   . Heart disease Maternal Grandfather      Social History   Socioeconomic History  . Marital status: Single    Spouse name: Not on file  . Number of children: 3  . Years of education: Not on file  . Highest education level: 12th grade  Occupational History    Comment: [employer]   Social Needs  . Financial resource strain: Not on file  . Food insecurity    Worry: Not on file    Inability: Not on file  . Transportation needs    Medical: Not on file    Non-medical: Not on file  Tobacco Use  . Smoking status: Never Smoker  . Smokeless tobacco: Never Used  Substance and Sexual Activity  . Alcohol use: Yes    Comment: 03/08/2017 "might have a drink  on a holiday"  . Drug use: No  . Sexual activity: Yes    Partners: Male    Birth control/protection: Injection  Lifestyle  . Physical activity    Days per week: Not on file    Minutes per session: Not on file  . Stress: Not on file  Relationships  . Social Musicianconnections    Talks on phone: Not on file    Gets together: Not on file    Attends religious service: Not on file    Active member of club or organization: Not on file    Attends meetings of clubs or organizations: Not on file    Relationship status: Not on file  . Intimate partner violence    Fear of current or ex partner: Not on file    Emotionally abused: Not on file    Physically  abused: Not on file    Forced sexual activity: Not on file  Other Topics Concern  . Not on file  Social History Narrative   ** Merged History Encounter **   Right handed    Lives at home with significant other 2 cups daily of caffeine      PHYSICAL EXAM:  VS: BP 138/88   Ht 5\' 4"  (1.626 m)   Wt 157 lb (71.2 kg)   BMI 26.95 kg/m  Physical Exam Gen: NAD, alert, cooperative with exam, well-appearing ENT: normal lips, normal nasal mucosa,  Eye: normal EOM, normal conjunctiva and lids CV:  no edema, +2 pedal pulses   Resp: no accessory muscle use, non-labored,   Skin: no rashes, no areas of induration  Neuro: normal tone, normal sensation to touch Psych:  normal insight, alert and oriented MSK:  Left hand: Swelling and erythema of the flexor surface of the thumb. Limited range of motion secondary to pain. Streaking occurring over the flexor surface. Neurovascularly intact  Limited ultrasound: Left thumb:  There is an encircling effusion of the flexor tendon and increased hyperemia in this area.  This is likely associated with infection. Normal-appearing CMC joint.   Summary: Findings suggestive of flexor infected tenosynovitis  Ultrasound and interpretation by Clare GandyJeremy Edrees Valent, MD      ASSESSMENT & PLAN:   Pain of left thumb Findings suggestive of a infected flexor tenosynovitis.  She was given a gram of ceftriaxone last night at the urgent care and has had improvement of her symptoms.  She is still using a thumb spica.  Spoke with Dr. Izora Ribasoley about the case.  Can consider oral antibiotics or seen in the emergency department. -Provided with Bactrim.  Counseled with the patient discussed her options.  She will plan on going to the emergency department tomorrow. -Continue thumb spica Velcro splint. -Given indications to seek more immediate care.

## 2019-04-15 NOTE — Patient Instructions (Signed)
Nice to see you Please use the brace   Please send me a message in MyChart with any questions or updates.  I'll give you a call later today.   --Dr. Raeford Razor

## 2019-04-16 ENCOUNTER — Emergency Department (HOSPITAL_COMMUNITY)
Admission: EM | Admit: 2019-04-16 | Discharge: 2019-04-16 | Disposition: A | Discharge status: Home / Self Care | Payer: Managed Care, Other (non HMO) | Attending: Emergency Medicine | Admitting: Emergency Medicine

## 2019-04-16 ENCOUNTER — Telehealth (HOSPITAL_COMMUNITY): Payer: Self-pay | Admitting: Emergency Medicine

## 2019-04-16 ENCOUNTER — Encounter (HOSPITAL_COMMUNITY): Payer: Self-pay | Admitting: Emergency Medicine

## 2019-04-16 DIAGNOSIS — Z79899 Other long term (current) drug therapy: Secondary | ICD-10-CM | POA: Diagnosis not present

## 2019-04-16 DIAGNOSIS — X58XXXD Exposure to other specified factors, subsequent encounter: Secondary | ICD-10-CM | POA: Insufficient documentation

## 2019-04-16 DIAGNOSIS — E119 Type 2 diabetes mellitus without complications: Secondary | ICD-10-CM | POA: Insufficient documentation

## 2019-04-16 DIAGNOSIS — M79642 Pain in left hand: Secondary | ICD-10-CM | POA: Insufficient documentation

## 2019-04-16 DIAGNOSIS — I10 Essential (primary) hypertension: Secondary | ICD-10-CM | POA: Diagnosis not present

## 2019-04-16 LAB — CBC WITH DIFFERENTIAL/PLATELET
Abs Immature Granulocytes: 0.01 10*3/uL (ref 0.00–0.07)
Basophils Absolute: 0 10*3/uL (ref 0.0–0.1)
Basophils Relative: 1 %
Eosinophils Absolute: 0 10*3/uL (ref 0.0–0.5)
Eosinophils Relative: 0 %
HCT: 41.6 % (ref 36.0–46.0)
Hemoglobin: 13.2 g/dL (ref 12.0–15.0)
Immature Granulocytes: 0 %
Lymphocytes Relative: 33 %
Lymphs Abs: 1.9 10*3/uL (ref 0.7–4.0)
MCH: 31.1 pg (ref 26.0–34.0)
MCHC: 31.7 g/dL (ref 30.0–36.0)
MCV: 97.9 fL (ref 80.0–100.0)
Monocytes Absolute: 0.4 10*3/uL (ref 0.1–1.0)
Monocytes Relative: 8 %
Neutro Abs: 3.3 10*3/uL (ref 1.7–7.7)
Neutrophils Relative %: 58 %
Platelets: 375 10*3/uL (ref 150–400)
RBC: 4.25 MIL/uL (ref 3.87–5.11)
RDW: 13.3 % (ref 11.5–15.5)
WBC: 5.7 10*3/uL (ref 4.0–10.5)
nRBC: 0 % (ref 0.0–0.2)

## 2019-04-16 LAB — COMPREHENSIVE METABOLIC PANEL
ALT: 22 U/L (ref 0–44)
AST: 24 U/L (ref 15–41)
Albumin: 3.8 g/dL (ref 3.5–5.0)
Alkaline Phosphatase: 69 U/L (ref 38–126)
Anion gap: 7 (ref 5–15)
BUN: 7 mg/dL (ref 6–20)
CO2: 26 mmol/L (ref 22–32)
Calcium: 9.2 mg/dL (ref 8.9–10.3)
Chloride: 105 mmol/L (ref 98–111)
Creatinine, Ser: 1.07 mg/dL — ABNORMAL HIGH (ref 0.44–1.00)
GFR calc Af Amer: >60 mL/min (ref >60)
GFR calc non Af Amer: >60 mL/min (ref >60)
Glucose, Bld: 74 mg/dL (ref 70–99)
Potassium: 3.5 mmol/L (ref 3.5–5.1)
Sodium: 138 mmol/L (ref 135–145)
Total Bilirubin: 0.4 mg/dL (ref 0.3–1.2)
Total Protein: 7.2 g/dL (ref 6.5–8.1)

## 2019-04-16 LAB — CK: Total CK: 392 U/L — ABNORMAL HIGH (ref 38–234)

## 2019-04-16 NOTE — ED Triage Notes (Signed)
Pt presents as requested by Dr. Rosiland Oz for hand surgery. She was seen 9/14 at the UC. Labs drawn at triage.   Pain of left thumb Findings suggestive of a infected flexor tenosynovitis.  She was given a gram of ceftriaxone last night at the urgent care and has had improvement of her symptoms.

## 2019-04-16 NOTE — ED Provider Notes (Signed)
[facility] EMERGENCY DEPARTMENT Provider Note   CSN: [id number] Arrival date & time: 04/16/19  0908     History   Chief Complaint Chief Complaint  Patient presents with  . Finger Injury    HPI Sandra Moody is a 48 y.o. female presents the department for left thumb pain and swelling.  Patient was seen by sports medicine yesterday for same where per chart review, exam was concerning for tenosynovitis.  Given a gram of Rocephin and placed in thumb spica splint.  Started on Bactrim, but was told to come to the emergency department this morning.  No fevers.  She actually feels as if her symptoms are improving and the swelling is going down.  Has a little more range of motion as well.      The history is provided by the patient and medical records. No language interpreter was used.    Past Medical History:  Diagnosis Date  . Acute renal disease   . Asthma   . Bilateral carpal tunnel syndrome 03/11/2019  . Common migraine with intractable migraine 08/29/2018  . Daily headache    (03/08/2017)  . Essential hypertension 02/20/2019  . GERD (gastroesophageal reflux disease)   . H pylori ulcer ~ 08/2016  . History of hiatal hernia   . Leaky heart valve ~ 09/2016  . Migraine    "maybe weekly" (03/08/2017)  . PONV (postoperative nausea and vomiting)   . Type 2 diabetes mellitus with hyperglycemia, without long-term current use of insulin ([facility]) 02/20/2019    Patient Active Problem List   Diagnosis Date Noted  . Pain of left thumb 04/15/2019  . Carpal tunnel syndrome, bilateral 03/11/2019  . Essential hypertension 02/20/2019  . Type 2 diabetes mellitus with hyperglycemia, without long-term current use of insulin ([facility]) 02/20/2019  . Other acute sinusitis 12/25/2018  . Common migraine with intractable migraine 08/29/2018  . Moderate persistent asthma without complication 95/28/4132  . Seasonal and perennial allergic rhinitis 06/28/2017  . Mild persistent asthma with acute  exacerbation 06/28/2017  . Gastroesophageal reflux disease 06/28/2017  . Biliary dyskinesia 03/08/2017  . Asthma 03/02/2016  . Asthma with acute exacerbation 10/04/2015  . Other and unspecified ovarian cyst 06/17/2012    Past Surgical History:  Procedure Laterality Date  . CHOLECYSTECTOMY N/A 03/08/2017   Procedure: LAPAROSCOPIC CHOLECYSTECTOMY;  Surgeon: Coralie Keens, MD;  Location: [facility];  Service: General;  Laterality: N/A;  . DILATION AND CURETTAGE OF UTERUS    . LAPAROSCOPIC CHOLECYSTECTOMY  03/08/2017  . TUBAL LIGATION       OB History    Gravida  5   Para  3   Term  3   Preterm  0   AB  2   Living        SAB  1   TAB  1   Ectopic  0   Multiple      Live Births               Home Medications    Prior to Admission medications   Medication Sig Start Date End Date Taking? Authorizing Provider  ACCU-CHEK GUIDE test strip USE TO CHECK GLUCOSE TWICE DAILY,FASTING AND 2 HOURS AFTER DINNER 10/30/18   [provider]  ALBUTEROL SULFATE PO Take by mouth.    [provider]  amLODipine (NORVASC) 2.5 MG tablet TAKE 1 TABLET BY MOUTH EVERY DAY 01/23/19   Rutherford Guys, MD  baclofen (LIORESAL) 10 MG tablet Take 10 mg by mouth  3 (three) times daily.    [provider]  blood glucose meter kit and supplies Per insurance preference. Use to check glucose twice a day (fasting and 2 hours after dinner)  Dx E11.65 10/30/18   Rutherford Guys, MD  EPIPEN 2-PAK 0.3 MG/0.3ML SOAJ injection Inject 0.3 mg into the muscle as needed (for allergic reaction).  05/18/16   [provider]  fluticasone (FLONASE) 50 MCG/ACT nasal spray USE 1 2 SPRAYS IN EACH NOSTRIL ONCE A DAY 10/25/18   [provider]  gabapentin (NEURONTIN) 100 MG capsule Take 2 capsules (200 mg total) by mouth 3 (three) times daily. 02/20/19 03/22/19  Rutherford Guys, MD  imipramine (TOFRANIL-PM) 100 MG capsule Take 100 mg by mouth at bedtime.    [provider]   levocetirizine (XYZAL) 5 MG tablet 1 2 TABLET IN THE EVENING ONCE A DAY ORALLY 15 11/05/18   [provider]  medroxyPROGESTERone (DEPO-PROVERA) 150 MG/ML injection  08/15/18   [provider]  montelukast (SINGULAIR) 10 MG tablet Take 10 mg by mouth at bedtime.    [provider]  olopatadine (PATANOL) 0.1 % ophthalmic solution Place 1 drop into both eyes 2 (two) times daily. 12/25/18   Corum, Rex Kras, MD  pioglitazone (ACTOS) 30 MG tablet Take 1 tablet (30 mg total) by mouth daily. 02/20/19   Rutherford Guys, MD  rizatriptan (MAXALT) 10 MG tablet Take 1 tablet (10 mg total) by mouth 3 (three) times daily as needed for migraine. 08/29/18   Kathrynn Ducking, MD  sulfamethoxazole-trimethoprim (BACTRIM DS) 800-160 MG tablet Take 1 tablet by mouth 2 (two) times daily. 04/15/19   Rosemarie Ax, MD  SYMBICORT 160-4.5 MCG/ACT inhaler INHALE 2 PUFFS INTO THE LUNGS DAILY. 05/09/18   Valentina Shaggy, MD    Family History Family History  Problem Relation Age of Onset  . Thyroid disease Mother   . Diabetes Maternal Grandmother   . Heart disease Maternal Grandfather     Social History Social History   Tobacco Use  . Smoking status: Never Smoker  . Smokeless tobacco: Never Used  Substance Use Topics  . Alcohol use: Yes    Comment: 03/08/2017 "might have a drink on a holiday"  . Drug use: No     Allergies   Ginger, Passion fruit flavor [flavoring agent], Peanuts [peanut oil], Shellfish allergy, and Tamiflu [oseltamivir phosphate]   Review of Systems Review of Systems  Musculoskeletal: Positive for arthralgias and joint swelling.  All other systems reviewed and are negative.    Physical Exam Updated Vital Signs BP 137/78 (BP Location: Right Arm)   Pulse 100   Temp 98.2 F (36.8 C) (Oral)   Resp 18   SpO2 100%   Physical Exam Vitals signs and nursing note reviewed.  Constitutional:      General: She is not in acute distress.    Appearance: She is  well-developed.  HENT:     Head: Normocephalic and atraumatic.  Neck:     Musculoskeletal: Neck supple.  Cardiovascular:     Rate and Rhythm: Normal rate and regular rhythm.     Heart sounds: Normal heart sounds. No murmur.  Pulmonary:     Effort: Pulmonary effort is normal. No respiratory distress.     Breath sounds: Normal breath sounds. No wheezing or rales.  Musculoskeletal:     Comments: Tenderness to palpation of the left thenar eminence and first MCP joint.  Mildly swollen.  No erythema or wounds.  No instability of the joint.  Sensation intact.  2+ radial pulse.  Skin:    General: Skin is warm and dry.  Neurological:     Mental Status: She is alert.      ED Treatments / Results  Labs (all labs ordered are listed, but only abnormal results are displayed) Labs Reviewed  COMPREHENSIVE METABOLIC PANEL - Abnormal; Notable for the following components:      Result Value   Creatinine, Ser 1.07 (*)    All other components within normal limits  CK - Abnormal; Notable for the following components:   Total CK 392 (*)    All other components within normal limits  CBC WITH DIFFERENTIAL/PLATELET    EKG None  Radiology Dg Hand Complete Left  Result Date: 04/14/2019 CLINICAL DATA:  Left thumb infection and pain for 3 days. No reported injury. EXAM: LEFT HAND - COMPLETE 3+ VIEW COMPARISON:  None. FINDINGS: No fracture or dislocation. No osseous erosions or periosteal reaction. No evidence of soft tissue gas. No suspicious focal osseous lesions. No significant arthropathy. No radiopaque foreign body. IMPRESSION: No acute osseous abnormality. Electronically Signed   By: Ilona Sorrel M.D.   On: 04/14/2019 19:50    Procedures Procedures (including critical care time)  Medications Ordered in ED Medications - No data to display   Initial Impression / Assessment and Plan / ED Course  I have reviewed the triage vital signs and the nursing notes.  Pertinent labs & imaging results  that were available during my care of the patient were reviewed by me and considered in my medical decision making (see chart for details).       KORTNI HASTEN is a 48 y.o. female who presents to ED for atraumatic left hand pain and swelling seen by sports medicine yesterday with concern for tenosynovitis.  Given a gram of Rocephin and started on Bactrim.  Today, she notes moderate improvement in her symptoms.  Range of motion seems better and swelling improved compared to exam and chart review.  No systemic symptoms.  Evaluated by hand surgery.  Appreciate assistance with her care today.  Please see consultation note for appropriate recommendations.  No indication for emergent surgery at this time.  Continue antibiotics and follow-up plan.  Patient understands and agrees.  Return precautions discussed and all questions answered.    Final Clinical Impressions(s) / ED Diagnoses   Final diagnoses:  Left hand pain    ED Discharge Orders    None       Kanin Lia, Ozella Almond, PA-C 04/16/19 1229    Hayden Rasmussen, MD 04/16/19 1730

## 2019-04-16 NOTE — Telephone Encounter (Signed)
Labs normal, Patient contacted and made aware of    results, all questions answered

## 2019-04-16 NOTE — ED Notes (Signed)
Pts blood sugar came up to 88 with crackers and juice

## 2019-04-16 NOTE — ED Notes (Signed)
Pt self reported cbg of 68, provided her with OJ and apple juice.

## 2019-04-16 NOTE — Discharge Instructions (Signed)
Continue your current antibiotics.  Follow up with your doctor.  Return to ER for fever, new/worsening symptoms, any additional concerns.

## 2019-04-16 NOTE — Consult Note (Signed)
Reason for Consult:Left hand infection Referring Physician: Ariabella Moody is an 48 y.o. female.  HPI: Sandra Moody comes in with a 5d hx/o left thumb/hand pain. It started Friday and got worse over the weekend. She describes pain at the base of the thumb associated with swelling. The swelling got really bad Monday involving her entire thumb. She went for evaluation and was given an abx shot and a rx for oral. She started the oral meds last night. Since Monday she notes improvement in both the swelling and the pain though it is still quite painful. She denies prior hx/o similar, fevers, chills, sweats, N/V. She is RHD.  Past Medical History:  Diagnosis Date  . Acute renal disease   . Asthma   . Bilateral carpal tunnel syndrome 03/11/2019  . Common migraine with intractable migraine 08/29/2018  . Daily headache    (03/08/2017)  . Essential hypertension 02/20/2019  . GERD (gastroesophageal reflux disease)   . H pylori ulcer ~ 08/2016  . History of hiatal hernia   . Leaky heart valve ~ 09/2016  . Migraine    "maybe weekly" (03/08/2017)  . PONV (postoperative nausea and vomiting)   . Type 2 diabetes mellitus with hyperglycemia, without long-term current use of insulin ([facility]) 02/20/2019    Past Surgical History:  Procedure Laterality Date  . CHOLECYSTECTOMY N/A 03/08/2017   Procedure: LAPAROSCOPIC CHOLECYSTECTOMY;  Surgeon: Abigail Miyamoto, MD;  Location: [facility] OR;  Service: General;  Laterality: N/A;  . DILATION AND CURETTAGE OF UTERUS    . LAPAROSCOPIC CHOLECYSTECTOMY  03/08/2017  . TUBAL LIGATION      Family History  Problem Relation Age of Onset  . Thyroid disease Mother   . Diabetes Maternal Grandmother   . Heart disease Maternal Grandfather     Social History:  reports that she has never smoked. She has never used smokeless tobacco. She reports current alcohol use. She reports that she does not use drugs.  Allergies:  Allergies  Allergen Reactions  . Ginger Itching and Swelling   . Passion Fruit Flavor [Flavoring Agent] Itching and Swelling  . Peanuts [Peanut Oil] Anaphylaxis    PT ALLERGIC TO ALL TREE NUTS  . Shellfish Allergy Anaphylaxis  . Tamiflu [Oseltamivir Phosphate]     Medications: I have reviewed the patient's current medications.  Results for orders placed or performed during the hospital encounter of 04/16/19 (from the past 48 hour(s))  CBC with Differential     Status: None   Collection Time: 04/16/19  9:35 AM  Result Value Ref Range   WBC 5.7 4.0 - 10.5 K/uL   RBC 4.25 3.87 - 5.11 MIL/uL   Hemoglobin 13.2 12.0 - 15.0 g/dL   HCT 90.3 83.3 - 38.3 %   MCV 97.9 80.0 - 100.0 fL   MCH 31.1 26.0 - 34.0 pg   MCHC 31.7 30.0 - 36.0 g/dL   RDW 29.1 91.6 - 60.6 %   Platelets 375 150 - 400 K/uL   nRBC 0.0 0.0 - 0.2 %   Neutrophils Relative % 58 %   Neutro Abs 3.3 1.7 - 7.7 K/uL   Lymphocytes Relative 33 %   Lymphs Abs 1.9 0.7 - 4.0 K/uL   Monocytes Relative 8 %   Monocytes Absolute 0.4 0.1 - 1.0 K/uL   Eosinophils Relative 0 %   Eosinophils Absolute 0.0 0.0 - 0.5 K/uL   Basophils Relative 1 %   Basophils Absolute 0.0 0.0 - 0.1 K/uL   Immature Granulocytes 0 %  Abs Immature Granulocytes 0.01 0.00 - 0.07 K/uL    Comment: Performed at [facility], [place] [address] [place], [place] [address]  Comprehensive metabolic panel     Status: Abnormal   Collection Time: 04/16/19  9:35 AM  Result Value Ref Range   Sodium 138 135 - 145 mmol/L   Potassium 3.5 3.5 - 5.1 mmol/L   Chloride 105 98 - 111 mmol/L   CO2 26 22 - 32 mmol/L   Glucose, Bld 74 70 - 99 mg/dL   BUN 7 6 - 20 mg/dL   Creatinine, Ser 1.07 (H) 0.44 - 1.00 mg/dL   Calcium 9.2 8.9 - 10.3 mg/dL   Total Protein 7.2 6.5 - 8.1 g/dL   Albumin 3.8 3.5 - 5.0 g/dL   AST 24 15 - 41 U/L   ALT 22 0 - 44 U/L   Alkaline Phosphatase 69 38 - 126 U/L   Total Bilirubin 0.4 0.3 - 1.2 mg/dL   GFR calc non Af Amer >60 >60 mL/min   GFR calc Af Amer >60 >60 mL/min   Anion gap 7 5 - 15    Comment:  Performed at [facility] [address], [place], [place] [address]  CK     Status: Abnormal   Collection Time: 04/16/19  9:35 AM  Result Value Ref Range   Total CK 392 (H) 38 - 234 U/L    Comment: Performed at [facility], [place] [address], [place], [place] [address]    Dg Hand Complete Left  Result Date: 04/14/2019 CLINICAL DATA:  Left thumb infection and pain for 3 days. No reported injury. EXAM: LEFT HAND - COMPLETE 3+ VIEW COMPARISON:  None. FINDINGS: No fracture or dislocation. No osseous erosions or periosteal reaction. No evidence of soft tissue gas. No suspicious focal osseous lesions. No significant arthropathy. No radiopaque foreign body. IMPRESSION: No acute osseous abnormality. Electronically Signed   By: Ilona Sorrel M.D.   On: 04/14/2019 19:50    Review of Systems  Constitutional: Negative for chills, fever and weight loss.  HENT: Negative for ear discharge, ear pain, hearing loss and tinnitus.   Eyes: Negative for blurred vision, double vision, photophobia and pain.  Respiratory: Negative for cough, sputum production and shortness of breath.   Cardiovascular: Negative for chest pain.  Gastrointestinal: Negative for abdominal pain, nausea and vomiting.  Genitourinary: Negative for dysuria, flank pain, frequency and urgency.  Musculoskeletal: Positive for joint pain (Left thumb). Negative for back pain, falls, myalgias and neck pain.  Neurological: Negative for dizziness, tingling, sensory change, focal weakness, loss of consciousness and headaches.  Endo/Heme/Allergies: Does not bruise/bleed easily.  Psychiatric/Behavioral: Negative for depression, memory loss and substance abuse. The patient is not nervous/anxious.    Blood pressure 137/78, pulse 100, temperature 98.2 F (36.8 C), temperature source Oral, resp. rate 18, SpO2 100 %. Physical Exam  Constitutional: She appears well-developed and well-nourished. No distress.  HENT:  Head: Normocephalic and  atraumatic.  Eyes: Conjunctivae are normal. Right eye exhibits no discharge. Left eye exhibits no discharge. No scleral icterus.  Neck: Normal range of motion.  Cardiovascular: Normal rate and regular rhythm.  Respiratory: Effort normal. No respiratory distress.  Musculoskeletal:     Comments: Left shoulder, elbow, wrist, digits- no skin wounds, mod TTP thenar, pain with AROM 1st MCP joint, limited add/flex, PROM much better, no instability  Sens  Ax/R/M/U intact  Mot   Ax/ R/ PIN/ M/ AIN/ U intact  Rad 2+  Neurological: She is alert.  Skin: Skin is warm and dry. She is not diaphoretic.  Psychiatric: She has a normal mood and affect. Her behavior is normal.    Assessment/Plan: Left hand infection -- While this may have been a tenosynovitis picture earlier she is clearly responding to treatment well. I recommend continuing the plan of care instituted by the prior practitioner. She may continued brace for comfort. Should she worsen or fail to improve she can be seen on an outpatient basis. DM Asthma    Freeman CaldronMichael J. Calton Harshfield, PA-C Orthopedic Surgery [phone] 04/16/2019, 12:11 PM

## 2019-04-16 NOTE — ED Notes (Signed)
Patient verbalizes understanding of discharge instructions. Opportunity for questioning and answers were provided. Armband removed by staff, pt discharged from ED ambulatory to home.  

## 2019-04-17 ENCOUNTER — Ambulatory Visit: Payer: Managed Care, Other (non HMO) | Admitting: Family Medicine

## 2019-04-19 ENCOUNTER — Other Ambulatory Visit: Payer: Self-pay | Admitting: Family Medicine

## 2019-04-21 ENCOUNTER — Ambulatory Visit (INDEPENDENT_AMBULATORY_CARE_PROVIDER_SITE_OTHER): Payer: Managed Care, Other (non HMO) | Admitting: Family Medicine

## 2019-04-21 ENCOUNTER — Encounter: Payer: Self-pay | Admitting: Family Medicine

## 2019-04-21 ENCOUNTER — Other Ambulatory Visit: Payer: Self-pay

## 2019-04-21 DIAGNOSIS — M6510 Other infective (teno)synovitis, unspecified site: Secondary | ICD-10-CM

## 2019-04-21 NOTE — Progress Notes (Signed)
Sandra Moody - 48 y.o. female MRN [id number]  Date of birth: 01/25/1971  SUBJECTIVE:  Including CC & ROS.  Chief Complaint  Patient presents with  . Follow-up    follow up for left thumb    Sandra Moody is a 48 y.o. female that is  Following up for her left thumb pain. Has been doing better since being on the antibiotics.  Was seen in the emergency department and was evaluated.  She was evaluated by the hand surgery team.  They advised against taking her to surgery at that time.  She had some swelling in the right side of her face that subsided on its own.  Denies any significant pain or redness.  The swelling is improved..   Review of Systems  Constitutional: Negative for fever.  HENT: Negative for congestion.   Respiratory: Negative for cough.   Cardiovascular: Negative for chest pain.  Gastrointestinal: Negative for abdominal pain.  Musculoskeletal: Negative for back pain.  Skin: Negative for color change.  Neurological: Negative for weakness.  Hematological: Negative for adenopathy.    HISTORY: Past Medical, Surgical, Social, and Family History Reviewed & Updated per EMR.   Pertinent Historical Findings include:  Past Medical History:  Diagnosis Date  . Acute renal disease   . Asthma   . Bilateral carpal tunnel syndrome 03/11/2019  . Common migraine with intractable migraine 08/29/2018  . Daily headache    (03/08/2017)  . Essential hypertension 02/20/2019  . GERD (gastroesophageal reflux disease)   . H pylori ulcer ~ 08/2016  . History of hiatal hernia   . Leaky heart valve ~ 09/2016  . Migraine    "maybe weekly" (03/08/2017)  . PONV (postoperative nausea and vomiting)   . Type 2 diabetes mellitus with hyperglycemia, without long-term current use of insulin (HCC) 02/20/2019    Past Surgical History:  Procedure Laterality Date  . CHOLECYSTECTOMY N/A 03/08/2017   Procedure: LAPAROSCOPIC CHOLECYSTECTOMY;  Surgeon: Abigail MiyamotoBlackman, Douglas, MD;  [facility];  Service: General;   Laterality: N/A;  . DILATION AND CURETTAGE OF UTERUS    . LAPAROSCOPIC CHOLECYSTECTOMY  03/08/2017  . TUBAL LIGATION      Allergies  Allergen Reactions  . Ginger Itching and Swelling  . Passion Fruit Flavor [Flavoring Agent] Itching and Swelling  . Peanuts [Peanut Oil] Anaphylaxis    PT ALLERGIC TO ALL TREE NUTS  . Shellfish Allergy Anaphylaxis  . Tamiflu [Oseltamivir Phosphate]     Family History  Problem Relation Age of Onset  . Thyroid disease Mother   . Diabetes Maternal Grandmother   . Heart disease Maternal Grandfather      Social History   Socioeconomic History  . Marital status: Single    Spouse name: Not on file  . Number of children: 3  . Years of education: Not on file  . Highest education level: 12th grade  Occupational History    Comment: [employer]   Social Needs  . Financial resource strain: Not on file  . Food insecurity    Worry: Not on file    Inability: Not on file  . Transportation needs    Medical: Not on file    Non-medical: Not on file  Tobacco Use  . Smoking status: Never Smoker  . Smokeless tobacco: Never Used  Substance and Sexual Activity  . Alcohol use: Yes    Comment: 03/08/2017 "might have a drink on a holiday"  . Drug use: No  . Sexual activity: Yes    Partners:  Male    Birth control/protection: Injection  Lifestyle  . Physical activity    Days per week: Not on file    Minutes per session: Not on file  . Stress: Not on file  Relationships  . Social Herbalist on phone: Not on file    Gets together: Not on file    Attends religious service: Not on file    Active member of club or organization: Not on file    Attends meetings of clubs or organizations: Not on file    Relationship status: Not on file  . Intimate partner violence    Fear of current or ex partner: Not on file    Emotionally abused: Not on file    Physically abused: Not on file    Forced sexual activity: Not on file  Other Topics Concern  .  Not on file  Social History Narrative   ** Merged History Encounter **   Right handed    Lives at home with significant other 2 cups daily of caffeine      PHYSICAL EXAM:  VS: BP 120/84   Ht 5\' 4"  (1.626 m)   Wt 156 lb (70.8 kg)   BMI 26.78 kg/m  Physical Exam Gen: NAD, alert, cooperative with exam, well-appearing ENT: normal lips, normal nasal mucosa,  Eye: normal EOM, normal conjunctiva and lids CV:  no edema, +2 pedal pulses   Resp: no accessory muscle use, non-labored,  Skin: no rashes, no areas of induration  Neuro: normal tone, normal sensation to touch Psych:  normal insight, alert and oriented MSK:  Left thumb: Normal range of motion. No redness or swelling. No tenderness to palpation over the [facility] joint or flexor tendon. Neurovascular intact     ASSESSMENT & PLAN:   Infection of flexor tendon sheath Has been on Bactrim for about a week now.  Significant improvement of her symptoms with medication.  Has mild pain in the flexor sheath intermittently. -Finished antibiotics. -Counseled on supportive care. -Give indications to return.

## 2019-04-21 NOTE — Assessment & Plan Note (Signed)
Has been on Bactrim for about a week now.  Significant improvement of her symptoms with medication.  Has mild pain in the flexor sheath intermittently. -Finished antibiotics. -Counseled on supportive care. -Give indications to return.

## 2019-04-26 ENCOUNTER — Emergency Department (HOSPITAL_BASED_OUTPATIENT_CLINIC_OR_DEPARTMENT_OTHER)
Admission: EM | Admit: 2019-04-26 | Discharge: 2019-04-27 | Disposition: A | Discharge status: Home / Self Care | Payer: Managed Care, Other (non HMO) | Attending: Emergency Medicine | Admitting: Emergency Medicine

## 2019-04-26 ENCOUNTER — Other Ambulatory Visit: Payer: Self-pay

## 2019-04-26 ENCOUNTER — Emergency Department (HOSPITAL_BASED_OUTPATIENT_CLINIC_OR_DEPARTMENT_OTHER): Payer: Managed Care, Other (non HMO)

## 2019-04-26 ENCOUNTER — Encounter (HOSPITAL_BASED_OUTPATIENT_CLINIC_OR_DEPARTMENT_OTHER): Payer: Self-pay

## 2019-04-26 DIAGNOSIS — E119 Type 2 diabetes mellitus without complications: Secondary | ICD-10-CM | POA: Diagnosis not present

## 2019-04-26 DIAGNOSIS — Z888 Allergy status to other drugs, medicaments and biological substances status: Secondary | ICD-10-CM | POA: Insufficient documentation

## 2019-04-26 DIAGNOSIS — J45909 Unspecified asthma, uncomplicated: Secondary | ICD-10-CM | POA: Insufficient documentation

## 2019-04-26 DIAGNOSIS — Z91013 Allergy to seafood: Secondary | ICD-10-CM | POA: Diagnosis not present

## 2019-04-26 DIAGNOSIS — E876 Hypokalemia: Secondary | ICD-10-CM | POA: Insufficient documentation

## 2019-04-26 DIAGNOSIS — R2232 Localized swelling, mass and lump, left upper limb: Secondary | ICD-10-CM | POA: Insufficient documentation

## 2019-04-26 DIAGNOSIS — Z7984 Long term (current) use of oral hypoglycemic drugs: Secondary | ICD-10-CM | POA: Insufficient documentation

## 2019-04-26 DIAGNOSIS — Z79899 Other long term (current) drug therapy: Secondary | ICD-10-CM | POA: Insufficient documentation

## 2019-04-26 DIAGNOSIS — Z91010 Allergy to peanuts: Secondary | ICD-10-CM | POA: Insufficient documentation

## 2019-04-26 DIAGNOSIS — Z91018 Allergy to other foods: Secondary | ICD-10-CM | POA: Diagnosis not present

## 2019-04-26 DIAGNOSIS — I10 Essential (primary) hypertension: Secondary | ICD-10-CM | POA: Diagnosis not present

## 2019-04-26 DIAGNOSIS — M7989 Other specified soft tissue disorders: Secondary | ICD-10-CM

## 2019-04-26 LAB — BASIC METABOLIC PANEL
Anion gap: 8 (ref 5–15)
BUN: 7 mg/dL (ref 6–20)
CO2: 26 mmol/L (ref 22–32)
Calcium: 8.9 mg/dL (ref 8.9–10.3)
Chloride: 106 mmol/L (ref 98–111)
Creatinine, Ser: 0.86 mg/dL (ref 0.44–1.00)
GFR calc Af Amer: >60 mL/min (ref >60)
GFR calc non Af Amer: >60 mL/min (ref >60)
Glucose, Bld: 124 mg/dL — ABNORMAL HIGH (ref 70–99)
Potassium: 3 mmol/L — ABNORMAL LOW (ref 3.5–5.1)
Sodium: 140 mmol/L (ref 135–145)

## 2019-04-26 LAB — CBC WITH DIFFERENTIAL/PLATELET
Abs Immature Granulocytes: 0.01 10*3/uL (ref 0.00–0.07)
Basophils Absolute: 0 10*3/uL (ref 0.0–0.1)
Basophils Relative: 0 %
Eosinophils Absolute: 0 10*3/uL (ref 0.0–0.5)
Eosinophils Relative: 1 %
HCT: 41.1 % (ref 36.0–46.0)
Hemoglobin: 12.8 g/dL (ref 12.0–15.0)
Immature Granulocytes: 0 %
Lymphocytes Relative: 37 %
Lymphs Abs: 2.5 10*3/uL (ref 0.7–4.0)
MCH: 30.7 pg (ref 26.0–34.0)
MCHC: 31.1 g/dL (ref 30.0–36.0)
MCV: 98.6 fL (ref 80.0–100.0)
Monocytes Absolute: 0.7 10*3/uL (ref 0.1–1.0)
Monocytes Relative: 11 %
Neutro Abs: 3.5 10*3/uL (ref 1.7–7.7)
Neutrophils Relative %: 51 %
Platelets: 345 10*3/uL (ref 150–400)
RBC: 4.17 MIL/uL (ref 3.87–5.11)
RDW: 13.2 % (ref 11.5–15.5)
WBC: 6.8 10*3/uL (ref 4.0–10.5)
nRBC: 0 % (ref 0.0–0.2)

## 2019-04-26 LAB — PREGNANCY, URINE: Preg Test, Ur: NEGATIVE

## 2019-04-26 MED ORDER — NAPROXEN 375 MG PO TABS: 375.0000 mg | ORAL_TABLET | Freq: Two times a day (BID) | ORAL | 0 refills | Status: DC

## 2019-04-26 MED ORDER — ACETAMINOPHEN 325 MG PO TABS
650.0000 mg | ORAL_TABLET | Freq: Once | ORAL | Status: AC
  Administered 2019-04-26: 650 mg via ORAL
  Filled 2019-04-26: qty 2

## 2019-04-26 MED ORDER — SULFAMETHOXAZOLE-TRIMETHOPRIM 800-160 MG PO TABS: 1.0000 | ORAL_TABLET | Freq: Two times a day (BID) | ORAL | 0 refills | Status: AC

## 2019-04-26 MED ORDER — POTASSIUM CHLORIDE CRYS ER 20 MEQ PO TBCR
40.0000 meq | EXTENDED_RELEASE_TABLET | Freq: Once | ORAL | Status: AC
  Administered 2019-04-26: 40 meq via ORAL
  Filled 2019-04-26: qty 2

## 2019-04-26 MED ORDER — POTASSIUM CHLORIDE CRYS ER 20 MEQ PO TBCR: 20.0000 meq | EXTENDED_RELEASE_TABLET | Freq: Every day | ORAL | 0 refills | Status: DC

## 2019-04-26 NOTE — ED Triage Notes (Signed)
Pt reports she had an infection in the L hand and was started on abx. Pt took her last dose on Monday. Pt started having increased pain and new swelling to the L hand today.

## 2019-04-26 NOTE — Discharge Instructions (Signed)
You are being prescribed naproxen.  Do not take other NSAIDs such as ibuprofen, Advil, Aleve, Motrin, Naprosyn, etc. You may use the wrist splint given to you for comfort

## 2019-04-26 NOTE — ED Notes (Signed)
Pt was given a warm blanket

## 2019-04-27 LAB — C-REACTIVE PROTEIN: CRP: 0.8 mg/dL (ref <1.0)

## 2019-04-27 LAB — SEDIMENTATION RATE: Sed Rate: 3 mm/hr (ref 0–22)

## 2019-04-27 NOTE — ED Provider Notes (Signed)
[facility] Provider Note   CSN: [id number] Arrival date & time: 04/26/19  2114     History   Chief Complaint Chief Complaint  Patient presents with   Hand Infection    HPI Sandra Moody is a 48 y.o. female.     HPI  49 year old female presents with left thumb swelling.  Was seen at the hospital last week for similar.  Overall this started about a week and a half ago.  Atraumatic pain and swelling and there was concern for Tina synovitis.  She was placed on Bactrim.  She finished this about 5 days ago.  Pain was much better though still little bit present but then today the swelling recurred.  No redness.  No fevers or vomiting.  No numbness.  She can move her thumb but it hurts.  Past Medical History:  Diagnosis Date   Acute renal disease    Asthma    Bilateral carpal tunnel syndrome 03/11/2019   Common migraine with intractable migraine 08/29/2018   Daily headache    (03/08/2017)   Essential hypertension 02/20/2019   GERD (gastroesophageal reflux disease)    H pylori ulcer ~ 08/2016   History of hiatal hernia    Leaky heart valve ~ 09/2016   Migraine    "maybe weekly" (03/08/2017)   PONV (postoperative nausea and vomiting)    Type 2 diabetes mellitus with hyperglycemia, without long-term current use of insulin ([place]) 02/20/2019    Patient Active Problem List   Diagnosis Date Noted   Infection of flexor tendon sheath 04/15/2019   Carpal tunnel syndrome, bilateral 03/11/2019   Essential hypertension 02/20/2019   Type 2 diabetes mellitus with hyperglycemia, without long-term current use of insulin ([place]) 02/20/2019   Other acute sinusitis 12/25/2018   Common migraine with intractable migraine 08/29/2018   Moderate persistent asthma without complication 93/57/0177   Seasonal and perennial allergic rhinitis 06/28/2017   Mild persistent asthma with acute exacerbation 06/28/2017   Gastroesophageal reflux disease 06/28/2017     Biliary dyskinesia 03/08/2017   Asthma 03/02/2016   Asthma with acute exacerbation 10/04/2015   Other and unspecified ovarian cyst 06/17/2012    Past Surgical History:  Procedure Laterality Date   CHOLECYSTECTOMY N/A 03/08/2017   Procedure: LAPAROSCOPIC CHOLECYSTECTOMY;  Surgeon: Coralie Keens, MD;  Location: [facility];  Service: General;  Laterality: N/A;   DILATION AND CURETTAGE OF UTERUS     LAPAROSCOPIC CHOLECYSTECTOMY  03/08/2017   TUBAL LIGATION       OB History    Gravida  5   Para  3   Term  3   Preterm  0   AB  2   Living        SAB  1   TAB  1   Ectopic  0   Multiple      Live Births               Home Medications    Prior to Admission medications   Medication Sig Start Date End Date Taking? Authorizing Provider  ACCU-CHEK GUIDE test strip USE TO CHECK GLUCOSE TWICE DAILY,FASTING AND 2 HOURS AFTER DINNER 10/30/18   [provider]  ALBUTEROL SULFATE PO Take by mouth.    [provider]  amLODipine (NORVASC) 2.5 MG tablet TAKE 1 TABLET BY MOUTH EVERY DAY 04/19/19   Rutherford Guys, MD  baclofen (LIORESAL) 10 MG tablet Take 10 mg by mouth 3 (three) times daily.    [provider]  blood glucose meter kit and supplies Per insurance preference. Use to check glucose twice a day (fasting and 2 hours after dinner)  Dx E11.65 10/30/18   Rutherford Guys, MD  EPIPEN 2-PAK 0.3 MG/0.3ML SOAJ injection Inject 0.3 mg into the muscle as needed (for allergic reaction).  05/18/16   [provider]  fluticasone (FLONASE) 50 MCG/ACT nasal spray USE 1 2 SPRAYS IN EACH NOSTRIL ONCE A DAY 10/25/18   [provider]  gabapentin (NEURONTIN) 100 MG capsule Take 2 capsules (200 mg total) by mouth 3 (three) times daily. 02/20/19 03/22/19  Rutherford Guys, MD  imipramine (TOFRANIL-PM) 100 MG capsule Take 100 mg by mouth at bedtime.    [provider]  levocetirizine (XYZAL) 5 MG tablet 1 2 TABLET IN THE EVENING ONCE A  DAY ORALLY 15 11/05/18   [provider]  medroxyPROGESTERone (DEPO-PROVERA) 150 MG/ML injection  08/15/18   [provider]  montelukast (SINGULAIR) 10 MG tablet Take 10 mg by mouth at bedtime.    [provider]  naproxen (NAPROSYN) 375 MG tablet Take 1 tablet (375 mg total) by mouth 2 (two) times daily. 04/26/19   Sherwood Gambler, MD  olopatadine (PATANOL) 0.1 % ophthalmic solution Place 1 drop into both eyes 2 (two) times daily. 12/25/18   Corum, Rex Kras, MD  pioglitazone (ACTOS) 30 MG tablet Take 1 tablet (30 mg total) by mouth daily. 02/20/19   Rutherford Guys, MD  potassium chloride SA (K-DUR) 20 MEQ tablet Take 1 tablet (20 mEq total) by mouth daily. 04/26/19   Sherwood Gambler, MD  rizatriptan (MAXALT) 10 MG tablet Take 1 tablet (10 mg total) by mouth 3 (three) times daily as needed for migraine. 08/29/18   Kathrynn Ducking, MD  sulfamethoxazole-trimethoprim (BACTRIM DS) 800-160 MG tablet Take 1 tablet by mouth 2 (two) times daily for 5 days. 04/26/19 05/01/19  Sherwood Gambler, MD  SYMBICORT 160-4.5 MCG/ACT inhaler INHALE 2 PUFFS INTO THE LUNGS DAILY. 05/09/18   Valentina Shaggy, MD    Family History Family History  Problem Relation Age of Onset   Thyroid disease Mother    Diabetes Maternal Grandmother    Heart disease Maternal Grandfather     Social History Social History   Tobacco Use   Smoking status: Never Smoker   Smokeless tobacco: Never Used  Substance Use Topics   Alcohol use: Yes    Comment: 03/08/2017 "might have a drink on a holiday"   Drug use: No     Allergies   Ginger, Passion fruit flavor [flavoring agent], Peanuts [peanut oil], Shellfish allergy, and Tamiflu [oseltamivir phosphate]   Review of Systems Review of Systems  Constitutional: Negative for fever.  Musculoskeletal: Positive for arthralgias and joint swelling.  Neurological: Negative for weakness and numbness.     Physical Exam Updated Vital Signs BP 131/84 (BP  Location: Right Arm)    Pulse 95    Temp 98.4 F (36.9 C)    Resp 20    Ht 5' 4"  (1.626 m)    Wt 74 kg    SpO2 100%    BMI 28.00 kg/m   Physical Exam Vitals signs and nursing note reviewed.  Constitutional:      General: She is not in acute distress.    Appearance: She is well-developed. She is not ill-appearing or diaphoretic.  HENT:     Head: Normocephalic and atraumatic.     Right Ear: External ear normal.     Left Ear:  External ear normal.     Nose: Nose normal.  Eyes:     General:        Right eye: No discharge.        Left eye: No discharge.  Cardiovascular:     Rate and Rhythm: Normal rate and regular rhythm.     Pulses: Normal pulses.  Pulmonary:     Effort: Pulmonary effort is normal.  Abdominal:     General: There is no distension.     Palpations: Abdomen is soft.  Musculoskeletal:     Left hand: She exhibits tenderness and swelling.       Hands:  Skin:    General: Skin is warm and dry.     Findings: No erythema.  Neurological:     Mental Status: She is alert.  Psychiatric:        Mood and Affect: Mood is not anxious.          ED Treatments / Results  Labs (all labs ordered are listed, but only abnormal results are displayed) Labs Reviewed  BASIC METABOLIC PANEL - Abnormal; Notable for the following components:      Result Value   Potassium 3.0 (*)    Glucose, Bld 124 (*)    All other components within normal limits  CBC WITH DIFFERENTIAL/PLATELET  PREGNANCY, URINE  SEDIMENTATION RATE  C-REACTIVE PROTEIN    EKG None  Radiology Dg Hand Complete Left  Result Date: 04/26/2019 CLINICAL DATA:  Left hand/thumb pain and swelling. Patient reports infection started antibiotics. Increased pain and swelling today. EXAM: LEFT HAND - COMPLETE 3+ VIEW COMPARISON:  Radiograph 04/14/2019 FINDINGS: There is no evidence of fracture or dislocation. Incidental osseous lunotriquetral coalition. There is no evidence of arthropathy or other focal bone abnormality.  No periosteal reaction or bony destructive change. Mild soft tissue edema of the thumb. No soft tissue air. No radiopaque foreign body. IMPRESSION: Mild soft tissue edema of the thumb without soft tissue air or acute osseous abnormality. No radiographic findings of osteomyelitis. Electronically Signed   By: Keith Rake M.D.   On: 04/26/2019 23:07    Procedures Procedures (including critical care time)  Medications Ordered in ED Medications  acetaminophen (TYLENOL) tablet 650 mg (650 mg Oral Given 04/26/19 2316)  potassium chloride SA (K-DUR) CR tablet 40 mEq (40 mEq Oral Given 04/26/19 2357)     Initial Impression / Assessment and Plan / ED Course  I have reviewed the triage vital signs and the nursing notes.  Pertinent labs & imaging results that were available during my care of the patient were reviewed by me and considered in my medical decision making (see chart for details).        Discussed with Dr. Fredna Dow.  Patient's white count is normal and there is no overt infectious signs such as fever or redness.  This is probably inflammatory rather than infectious.  He can see her early next week and that asked her to call the office on 9/28.  She will be given Bactrim again per his recommendations and also anti-inflammatories.  Will replace potassium.  Otherwise, does not appear to need emergent operation/I&D.  Final Clinical Impressions(s) / ED Diagnoses   Final diagnoses:  Swelling of left thumb  Hypokalemia    ED Discharge Orders         Ordered    naproxen (NAPROSYN) 375 MG tablet  2 times daily     04/26/19 2355    potassium chloride SA (K-DUR) 20 MEQ tablet  Daily     04/26/19 2355    sulfamethoxazole-trimethoprim (BACTRIM DS) 800-160 MG tablet  2 times daily     04/26/19 2355           Sherwood Gambler, MD 04/27/19 [phone]

## 2019-04-30 ENCOUNTER — Other Ambulatory Visit: Payer: Self-pay

## 2019-04-30 ENCOUNTER — Encounter: Payer: Self-pay | Admitting: Neurology

## 2019-04-30 ENCOUNTER — Ambulatory Visit (INDEPENDENT_AMBULATORY_CARE_PROVIDER_SITE_OTHER): Payer: Managed Care, Other (non HMO) | Admitting: Neurology

## 2019-04-30 VITALS — BP 128/84 | HR 107 | Temp 97.8°F | Ht 64.0 in | Wt 160.0 lb

## 2019-04-30 DIAGNOSIS — G43019 Migraine without aura, intractable, without status migrainosus: Secondary | ICD-10-CM

## 2019-04-30 DIAGNOSIS — M65312 Trigger thumb, left thumb: Secondary | ICD-10-CM | POA: Insufficient documentation

## 2019-04-30 MED ORDER — IMIPRAMINE PAMOATE 100 MG PO CAPS: 100.0000 mg | ORAL_CAPSULE | Freq: Every day | ORAL | 3 refills | Status: DC

## 2019-04-30 NOTE — Progress Notes (Signed)
Reason for visit: Migraine headache  Sandra Moody is an 48 y.o. female  History of present illness:  Sandra Moody is a 48 year old right-handed black female with a history of migraine headache.  The patient claims that she has about 4-5 headache days a month currently, she is on imipramine taking 100 mg at night.  Her heart rate on this medication consistently is over 100.  She takes Maxalt if needed, she believes this medication is helpful.  She recently has developed swelling and heat involving the left thumb, she was placed on antibiotics for this, the swelling has improved but the pain remains severe.  She will be seeing Dr. Fredna Dow today.  The patient does have bilateral carpal tunnel syndrome by nerve conduction studies.  The patient denies any significant neck pain, she does have some occasional low back pain, she still feels somewhat weak in the left leg even though EMG study done previously was unremarkable.  She has been to the emergency room on 3 occasions, on the 16th, 14th, and 28 September because of her left thumb issue.  She returns today for an evaluation.  Past Medical History:  Diagnosis Date  . Acute renal disease   . Asthma   . Bilateral carpal tunnel syndrome 03/11/2019  . Common migraine with intractable migraine 08/29/2018  . Daily headache    (03/08/2017)  . Essential hypertension 02/20/2019  . GERD (gastroesophageal reflux disease)   . H pylori ulcer ~ 08/2016  . History of hiatal hernia   . Leaky heart valve ~ 09/2016  . Migraine    "maybe weekly" (03/08/2017)  . PONV (postoperative nausea and vomiting)   . Type 2 diabetes mellitus with hyperglycemia, without long-term current use of insulin ([place]) 02/20/2019    Past Surgical History:  Procedure Laterality Date  . CHOLECYSTECTOMY N/A 03/08/2017   Procedure: LAPAROSCOPIC CHOLECYSTECTOMY;  Surgeon: Coralie Keens, MD;  Location: [facility];  Service: General;  Laterality: N/A;  . DILATION AND CURETTAGE OF UTERUS    .  LAPAROSCOPIC CHOLECYSTECTOMY  03/08/2017  . TUBAL LIGATION      Family History  Problem Relation Age of Onset  . Thyroid disease Mother   . Diabetes Maternal Grandmother   . Heart disease Maternal Grandfather     Social history:  reports that she has never smoked. She has never used smokeless tobacco. She reports current alcohol use. She reports that she does not use drugs.    Allergies  Allergen Reactions  . Ginger Itching and Swelling  . Passion Fruit Flavor [Flavoring Agent] Itching and Swelling  . Peanuts [Peanut Oil] Anaphylaxis    PT ALLERGIC TO ALL TREE NUTS  . Shellfish Allergy Anaphylaxis  . Tamiflu [Oseltamivir Phosphate]     Medications:  Prior to Admission medications   Medication Sig Start Date End Date Taking? Authorizing Provider  ACCU-CHEK GUIDE test strip USE TO CHECK GLUCOSE TWICE DAILY,FASTING AND 2 HOURS AFTER DINNER 10/30/18  Yes [provider]  ALBUTEROL SULFATE PO Take by mouth.   Yes [provider]  amLODipine (NORVASC) 2.5 MG tablet TAKE 1 TABLET BY MOUTH EVERY DAY 04/19/19  Yes Rutherford Guys, MD  baclofen (LIORESAL) 10 MG tablet Take 10 mg by mouth 3 (three) times daily.   Yes [provider]  blood glucose meter kit and supplies Per insurance preference. Use to check glucose twice a day (fasting and 2 hours after dinner)  Dx E11.65 10/30/18  Yes Rutherford Guys, MD  EPIPEN 2-PAK  0.3 MG/0.3ML SOAJ injection Inject 0.3 mg into the muscle as needed (for allergic reaction).  05/18/16  Yes [provider]  fluticasone (FLONASE) 50 MCG/ACT nasal spray USE 1 2 SPRAYS IN EACH NOSTRIL ONCE A DAY 10/25/18  Yes [provider]  imipramine (TOFRANIL-PM) 100 MG capsule Take 100 mg by mouth at bedtime.   Yes [provider]  levocetirizine (XYZAL) 5 MG tablet 1 2 TABLET IN THE EVENING ONCE A DAY ORALLY 15 11/05/18  Yes [provider]  medroxyPROGESTERone (DEPO-PROVERA) 150 MG/ML injection  08/15/18  Yes  [provider]  montelukast (SINGULAIR) 10 MG tablet Take 10 mg by mouth at bedtime.   Yes [provider]  naproxen (NAPROSYN) 375 MG tablet Take 1 tablet (375 mg total) by mouth 2 (two) times daily. 04/26/19  Yes Sherwood Gambler, MD  olopatadine (PATANOL) 0.1 % ophthalmic solution Place 1 drop into both eyes 2 (two) times daily. 12/25/18  Yes Corum, Rex Kras, MD  pioglitazone (ACTOS) 30 MG tablet Take 1 tablet (30 mg total) by mouth daily. 02/20/19  Yes Rutherford Guys, MD  potassium chloride SA (K-DUR) 20 MEQ tablet Take 1 tablet (20 mEq total) by mouth daily. 04/26/19  Yes Sherwood Gambler, MD  rizatriptan (MAXALT) 10 MG tablet Take 1 tablet (10 mg total) by mouth 3 (three) times daily as needed for migraine. 08/29/18  Yes Kathrynn Ducking, MD  sulfamethoxazole-trimethoprim (BACTRIM DS) 800-160 MG tablet Take 1 tablet by mouth 2 (two) times daily for 5 days. 04/26/19 05/01/19 Yes Sherwood Gambler, MD  SYMBICORT 160-4.5 MCG/ACT inhaler INHALE 2 PUFFS INTO THE LUNGS DAILY. 05/09/18  Yes Valentina Shaggy, MD  gabapentin (NEURONTIN) 100 MG capsule Take 2 capsules (200 mg total) by mouth 3 (three) times daily. 02/20/19 03/22/19  Rutherford Guys, MD    ROS:  Out of a complete 14 system review of symptoms, the patient complains only of the following symptoms, and all other reviewed systems are negative.  Left thumb pain Headache Leg weakness  Blood pressure 128/84, pulse (!) 107, temperature 97.8 F (36.6 C), temperature source Temporal, height '5\' 4"'$  (1.626 m), weight 160 lb (72.6 kg).  Physical Exam  General: The patient is alert and cooperative at the time of the examination.  Skin: No significant peripheral edema is noted.   Neurologic Exam  Mental status: The patient is alert and oriented x 3 at the time of the examination. The patient has apparent normal recent and remote memory, with an apparently normal attention span and concentration ability.   Cranial nerves:  Facial symmetry is present. Speech is normal, no aphasia or dysarthria is noted. Extraocular movements are full. Visual fields are full.  Motor: The patient has good strength in all 4 extremities.  The patient is unable to move the left thumb secondary to pain.  Sensory examination: Soft touch sensation is symmetric on the face, arms, and legs.  Coordination: The patient has good finger-nose-finger and heel-to-shin bilaterally.  Gait and station: The patient has a normal gait. Tandem gait is normal. Romberg is negative. No drift is seen.  Reflexes: Deep tendon reflexes are symmetric.   Assessment/Plan:  1.  Migraine headache  2.  Diabetes  3.  Bilateral carpal tunnel syndrome  4.  New onset left thumb discomfort  The EMG nerve conduction study evaluation was faxed to the office of Dr. Fredna Dow today.  The patient will be seeing him later on.  The patient will be continued on imipramine, a prescription  was sent in.  If desired, the patient may go on propranolol for better headache control, she does seem to be getting benefit from the Maxalt.  She is not missing work.  She will follow-up here in about 4 months.  Jill Alexanders MD 04/30/2019 8:34 AM  [facility] [address] [place], [place] [address]  Phone [phone] Fax [phone]

## 2019-05-02 ENCOUNTER — Ambulatory Visit (INDEPENDENT_AMBULATORY_CARE_PROVIDER_SITE_OTHER): Payer: Managed Care, Other (non HMO) | Admitting: Family Medicine

## 2019-05-02 ENCOUNTER — Encounter: Payer: Self-pay | Admitting: Family Medicine

## 2019-05-02 ENCOUNTER — Other Ambulatory Visit: Payer: Self-pay

## 2019-05-02 VITALS — BP 126/87 | HR 90 | Temp 98.8°F | Ht 64.0 in | Wt 160.0 lb

## 2019-05-02 DIAGNOSIS — R5383 Other fatigue: Secondary | ICD-10-CM | POA: Diagnosis not present

## 2019-05-02 DIAGNOSIS — E876 Hypokalemia: Secondary | ICD-10-CM | POA: Diagnosis not present

## 2019-05-02 MED ORDER — KETOCONAZOLE 2 % EX SHAM: 1.0000 "application " | MEDICATED_SHAMPOO | CUTANEOUS | 5 refills | Status: DC

## 2019-05-02 NOTE — Progress Notes (Signed)
10/2/20203:12 PM  Sandra Moody [birth year], 48 y.o., female [id number]  Chief Complaint  Patient presents with  . Fatigue    taking b12, feels tired all the time    HPI:   Patient is a 48 y.o. female with past medical history significant for HTN, migraines, asthma, GERD, biliary dyskinesia,who presents today forfollowup  Last OV July 2020 Has been feeling tired for past several months Falling asleep at work  [employer] to bed by 8pm and wakes up 5am, sleeps well She reports waking up refreshed, not snoring She started taking b12 OTC and not helping Takes vitamin D 5000 units a day Has checked sugar and has not been low Denies depression or anxiety Reports other symptoms is temp tolerance, headache Does not drink enough water She declines any further lab work  Lab Results  Component Value Date   TSH 1.560 11/18/2018   Lab Results  Component Value Date   WBC 6.8 04/26/2019   HGB 12.8 04/26/2019   HCT 41.1 04/26/2019   MCV 98.6 04/26/2019   PLT 345 04/26/2019   Lab Results  Component Value Date   CREATININE 0.86 04/26/2019   BUN 7 04/26/2019   NA 140 04/26/2019   K 3.0 (L) 04/26/2019   CL 106 04/26/2019   CO2 26 04/26/2019   Lab Results  Component Value Date   ALT 22 04/16/2019   AST 24 04/16/2019   ALKPHOS 69 04/16/2019   BILITOT 0.4 04/16/2019    Lab Results  Component Value Date   HGBA1C 6.7 (A) 02/20/2019   HGBA1C 10.3 (H) 10/01/2018   Lab Results  Component Value Date   LDLCALC 84 11/18/2018   CREATININE 0.86 04/26/2019    Depression screen PHQ 2/9 02/20/2019 01/28/2019 12/25/2018  Decreased Interest 0 0 0  Down, Depressed, Hopeless 0 0 0  PHQ - 2 Score 0 0 0    Fall Risk  02/20/2019 01/28/2019 12/25/2018 11/18/2018 11/07/2018  Falls in the past year? 0 0 0 0 0  Number falls in past yr: 0 - - 0 0  Injury with Fall? 0 - - 0 0  Follow up Falls evaluation completed - - - Falls evaluation completed     Allergies  Allergen Reactions  . Ginger  Itching and Swelling  . Passion Fruit Flavor [Flavoring Agent] Itching and Swelling  . Peanuts [Peanut Oil] Anaphylaxis    PT ALLERGIC TO ALL TREE NUTS  . Shellfish Allergy Anaphylaxis  . Tamiflu [Oseltamivir Phosphate]     Prior to Admission medications   Medication Sig Start Date End Date Taking? Authorizing Provider  ACCU-CHEK GUIDE test strip USE TO CHECK GLUCOSE TWICE DAILY,FASTING AND 2 HOURS AFTER DINNER 10/30/18  Yes [provider]  ALBUTEROL SULFATE PO Take by mouth.   Yes [provider]  amLODipine (NORVASC) 2.5 MG tablet TAKE 1 TABLET BY MOUTH EVERY DAY 04/19/19  Yes Rutherford Guys, MD  blood glucose meter kit and supplies Per insurance preference. Use to check glucose twice a day (fasting and 2 hours after dinner)  Dx E11.65 10/30/18  Yes Rutherford Guys, MD  EPIPEN 2-PAK 0.3 MG/0.3ML SOAJ injection Inject 0.3 mg into the muscle as needed (for allergic reaction).  05/18/16  Yes [provider]  fluticasone (FLONASE) 50 MCG/ACT nasal spray USE 1 2 SPRAYS IN EACH NOSTRIL ONCE A DAY 10/25/18  Yes [provider]  imipramine (TOFRANIL-PM) 100 MG capsule Take 1 capsule (100 mg total) by mouth at bedtime. 04/30/19  Yes  Kathrynn Ducking, MD  levocetirizine (XYZAL) 5 MG tablet 1 2 TABLET IN THE EVENING ONCE A DAY ORALLY 15 11/05/18  Yes [provider]  medroxyPROGESTERone (DEPO-PROVERA) 150 MG/ML injection  08/15/18  Yes [provider]  montelukast (SINGULAIR) 10 MG tablet Take 10 mg by mouth at bedtime.   Yes [provider]  olopatadine (PATANOL) 0.1 % ophthalmic solution Place 1 drop into both eyes 2 (two) times daily. 12/25/18  Yes Corum, Rex Kras, MD  pioglitazone (ACTOS) 30 MG tablet Take 1 tablet (30 mg total) by mouth daily. 02/20/19  Yes Rutherford Guys, MD  rizatriptan (MAXALT) 10 MG tablet Take 1 tablet (10 mg total) by mouth 3 (three) times daily as needed for migraine. 08/29/18  Yes Kathrynn Ducking, MD  SYMBICORT  160-4.5 MCG/ACT inhaler INHALE 2 PUFFS INTO THE LUNGS DAILY. 05/09/18  Yes Valentina Shaggy, MD  gabapentin (NEURONTIN) 100 MG capsule Take 2 capsules (200 mg total) by mouth 3 (three) times daily. 02/20/19 03/22/19  Rutherford Guys, MD    Past Medical History:  Diagnosis Date  . Acute renal disease   . Asthma   . Bilateral carpal tunnel syndrome 03/11/2019  . Common migraine with intractable migraine 08/29/2018  . Daily headache    (03/08/2017)  . Essential hypertension 02/20/2019  . GERD (gastroesophageal reflux disease)   . H pylori ulcer ~ 08/2016  . History of hiatal hernia   . Leaky heart valve ~ 09/2016  . Migraine    "maybe weekly" (03/08/2017)  . PONV (postoperative nausea and vomiting)   . Type 2 diabetes mellitus with hyperglycemia, without long-term current use of insulin ([place]) 02/20/2019    Past Surgical History:  Procedure Laterality Date  . CHOLECYSTECTOMY N/A 03/08/2017   Procedure: LAPAROSCOPIC CHOLECYSTECTOMY;  Surgeon: Coralie Keens, MD;  Location: [facility];  Service: General;  Laterality: N/A;  . DILATION AND CURETTAGE OF UTERUS    . LAPAROSCOPIC CHOLECYSTECTOMY  03/08/2017  . TUBAL LIGATION      Social History   Tobacco Use  . Smoking status: Never Smoker  . Smokeless tobacco: Never Used  Substance Use Topics  . Alcohol use: Yes    Comment: 03/08/2017 "might have a drink on a holiday"    Family History  Problem Relation Age of Onset  . Thyroid disease Mother   . Diabetes Maternal Grandmother   . Heart disease Maternal Grandfather     Review of Systems  Constitutional: Negative for chills and fever.  Respiratory: Negative for cough and shortness of breath.   Cardiovascular: Negative for chest pain, palpitations and leg swelling.  Gastrointestinal: Negative for abdominal pain, diarrhea, nausea and vomiting.  Genitourinary: Negative for frequency and urgency.  Endo/Heme/Allergies: Negative for polydipsia.  per hpi   OBJECTIVE:  Today's Vitals    05/02/19 1509  BP: 126/87  Pulse: 90  Temp: 98.8 F (37.1 C)  SpO2: 100%  Weight: 160 lb (72.6 kg)  Height: _0  (1.626 m)   Body mass index is 27.46 kg/m.   Physical Exam Vitals signs and nursing note reviewed.  Constitutional:      Appearance: She is well-developed.  HENT:     Head: Normocephalic and atraumatic.     Right Ear: Hearing, tympanic membrane, ear canal and external ear normal.     Left Ear: Hearing, tympanic membrane, ear canal and external ear normal.     Mouth/Throat:     Mouth: Mucous membranes are moist.     Pharynx: No  oropharyngeal exudate or posterior oropharyngeal erythema.  Eyes:     Extraocular Movements: Extraocular movements intact.     Conjunctiva/sclera: Conjunctivae normal.     Pupils: Pupils are equal, round, and reactive to light.  Neck:     Musculoskeletal: Neck supple.     Thyroid: No thyromegaly.  Cardiovascular:     Rate and Rhythm: Normal rate and regular rhythm.     Heart sounds: Normal heart sounds. No murmur. No friction rub. No gallop.   Pulmonary:     Effort: Pulmonary effort is normal.     Breath sounds: Normal breath sounds. No wheezing, rhonchi or rales.  Abdominal:     General: Bowel sounds are normal. There is no distension.     Palpations: Abdomen is soft. There is no hepatomegaly, splenomegaly or mass.     Tenderness: There is no abdominal tenderness.  Musculoskeletal: Normal range of motion.     Right lower leg: No edema.     Left lower leg: No edema.  Lymphadenopathy:     Cervical: No cervical adenopathy.  Skin:    General: Skin is warm and dry.  Neurological:     Mental Status: She is alert and oriented to person, place, and time.     Cranial Nerves: No cranial nerve deficit.     Gait: Gait normal.     Deep Tendon Reflexes: Reflexes are normal and symmetric.  Psychiatric:        Mood and Affect: Mood normal.        Behavior: Behavior normal.     No results found for this or any previous visit (from the past  24 hour(s)).  No results found.   ASSESSMENT and PLAN  1. Fatigue, unspecified type Exam and history reassuring. TSH could be rechecked. Discussed pushing fluids. RTC precautions given.   2. Hypokalemia Declines recheck today. Not on meds no GI losses. Discussed diet. RTC precautions given.   Other orders - ketoconazole (NIZORAL) 2 % shampoo; Apply 1 application topically 2 (two) times a week. Apply to affected area lather, leave in place for 5 mins and then rinse off with water  Return for as scheduled.    Rutherford Guys, MD [facility] [place], [place] [address] Ph.  [phone] Fax [phone]

## 2019-05-02 NOTE — Patient Instructions (Addendum)
If you have lab work done today you will be contacted with your lab results within the next 2 weeks.  If you have not heard from [place] then please contact us. The fastest way to get your results is to register for My Chart.   IF you received an x-ray today, you will receive an invoice from [facility]. Please contact [facility] at [phone] with questions or concerns regarding your invoice.   IF you received labwork today, you will receive an invoice from [facility]. Please contact [facility] at [phone] with questions or concerns regarding your invoice.   Our billing staff will not be able to assist you with questions regarding bills from these companies.  You will be contacted with the lab results as soon as they are available. The fastest way to get your results is to activate your My Chart account. Instructions are located on the last page of this paperwork. If you have not heard from [place] regarding the results in 2 weeks, please contact this office.        If you have lab work done today you will be contacted with your lab results within the next 2 weeks.  If you have not heard from [place] then please contact us. The fastest way to get your results is to register for My Chart.   IF you received an x-ray today, you will receive an invoice from [facility]. Please contact [facility] at [phone] with questions or concerns regarding your invoice.   IF you received labwork today, you will receive an invoice from [facility]. Please contact [facility] at [phone] with questions or concerns regarding your invoice.   Our billing staff will not be able to assist you with questions regarding bills from these companies.  You will be contacted with the lab results as soon as they are available. The fastest way to get your results is to activate your My Chart account. Instructions are located on the last page of this paperwork. If you have not heard from [place]  regarding the results in 2 weeks, please contact this office.     Potassium Content of Foods  Potassium is a mineral found in many foods and drinks. It affects how the heart works, and helps keep fluids and minerals balanced in the body. The amount of potassium you need each day depends on your age and any medical conditions you may have. Talk to your health care provider or dietitian about how much potassium you need. The following lists of foods provide the general serving size for foods and the approximate amount of potassium in each serving, listed in milligrams (mg). Actual values may vary depending on the product and how it is processed. High in potassium The following foods and beverages have 200 mg or more of potassium per serving:  Apricots (raw) - 2 have 200 mg of potassium.  Apricots (dry) - 5 have 200 mg of potassium.  Artichoke - 1 medium has 345 mg of potassium.  Avocado -  fruit has 245 mg of potassium.  Banana - 1 medium fruit has 425 mg of potassium.  Lima or baked beans (canned) -  cup has 280 mg of potassium.  White beans (canned) -  cup has 595 mg potassium.  Beef roast - 3 oz has 320 mg of potassium.  Ground beef - 3 oz has 270 mg of potassium.  Beets (raw or cooked) -  cup has 260 mg of potassium.  Bran muffin - 2  oz has 300 mg of potassium.  Broccoli (cooked) -  cup has 230 mg of potassium.  Brussels sprouts -  cup has 250 mg of potassium.  Cantaloupe -  cup has 215 mg of potassium.  Cereal, 100% bran -  cup has 200-400 mg of potassium.  Cheeseburger -1 single fast food burger has 225-400 mg of potassium.  Chicken - 3 oz has 220 mg of potassium.  Clams (canned) - 3 oz has 535 mg of potassium.  Crab - 3 oz has 225 mg of potassium.  Dates - 5 have 270 mg of potassium.  Dried beans and peas -  cup has 300-475 mg of potassium.  Figs (dried) - 2 have 260 mg of potassium.  Fish (halibut, tuna, cod, snapper) - 3 oz has 480 mg of  potassium.  Fish (salmon, haddock, swordfish, perch) - 3 oz has 300 mg of potassium.  Fish (tuna, canned) - 3 oz has 200 mg of potassium.  [name] fries (fast food) - 3 oz has 470 mg of potassium.  Granola with fruit and nuts -  cup has 200 mg of potassium.  Grapefruit juice -  cup has 200 mg of potassium.  Honeydew melon -  cup has 200 mg of potassium.  Kale (raw) - 1 cup has 300 mg of potassium.  Kiwi - 1 medium fruit has 240 mg of potassium.  Kohlrabi, rutabaga, parsnips -  cup has 280 mg of potassium.  Lentils -  cup has 365 mg of potassium.  Mango - 1 each has 325 mg of potassium.  Milk (nonfat, low-fat, whole, buttermilk) - 1 cup has 350-380 mg of potassium.  Milk (chocolate) - 1 cup has 420 mg of potassium  Molasses - 1 Tbsp has 295 mg of potassium.  Mushrooms -  cup has 280 mg of potassium.  Nectarine - 1 each has 275 mg of potassium.  Nuts (almonds, peanuts, hazelnuts, [place], cashew, mixed) - 1 oz has 200 mg of potassium.  Nuts (pistachios) - 1 oz has 295 mg of potassium.  Orange - 1 fruit has 240 mg of potassium.  Orange juice -  cup has 235 mg of potassium.  Papaya -  medium fruit has 390 mg of potassium.  Peanut butter (chunky) - 2 Tbsp has 240 mg of potassium.  Peanut butter (smooth) - 2 Tbsp has 210 mg of potassium.  Pear - 1 medium (200 mg) of potassium.  Pomegranate - 1 whole fruit has 400 mg of potassium.  Pomegranate juice -  cup has 215 mg of potassium.  Pork - 3 oz has 350 mg of potassium.  Potato chips (salted) - 1 oz has 465 mg of potassium.  Potato (baked with skin) - 1 medium has 925 mg of potassium.  Potato (boiled) -  cup has 255 mg of potassium.  Potato (Mashed) -  cup has 330 mg of potassium.  Prune juice -  cup has 370 mg of potassium.  Prunes - 5 have 305 mg of potassium.  Pudding (chocolate) -  cup has 230 mg of potassium.  Pumpkin (canned) -  cup has 250 mg of potassium.  Raisins (seedless) -  cup  has 270 mg of potassium.  Seeds (sunflower or pumpkin) - 1 oz has 240 mg of potassium.  Soy milk - 1 cup has 300 mg of potassium.  Spinach (cooked) - 1/2 cup has 420 mg of potassium.  Spinach (canned) -  cup has 370 mg of potassium.  Sweet potato (baked with  skin) - 1 medium has 450 mg of potassium.  Swiss chard -  cup has 480 mg of potassium.  Tomato or vegetable juice -  cup has 275 mg of potassium.  Tomato (sauce or puree) -  cup has 400-550 mg of potassium.  Tomato (raw) - 1 medium has 290 mg of potassium.  Tomato (canned) -  cup has 200-300 mg of potassium.  [place] - 3 oz has 250 mg of potassium.  Wheat germ - 1 oz has 250 mg of potassium.  Winter squash -  cup has 250 mg of potassium.  Yogurt (plain or fruited) - 6 oz has 260-435 mg of potassium.  Zucchini -  cup has 220 mg of potassium. Moderate in potassium The following foods and beverages have 50-200 mg of potassium per serving:  Apple - 1 fruit has 150 mg of potassium  Apple juice -  cup has 150 mg of potassium  Applesauce -  cup has 90 mg of potassium  Apricot nectar -  cup has 140 mg of potassium  Asparagus (small spears) -  cup has 155 mg of potassium  Asparagus (large spears) - 6 have 155 mg of potassium  Bagel (cinnamon raisin) - 1 four-inch bagel has 130 mg of potassium  Bagel (egg or plain) - 1 four- inch bagel has 70 mg of potassium  Beans (green) -  cup has 90 mg of potassium  Beans (yellow) -  cup has 190 mg of potassium  Beer, regular - 12 oz has 100 mg of potassium  Beets (canned) -  cup has 125 mg of potassium  Blackberries -  cup has 115 mg of potassium  Blueberries -  cup has 60 mg of potassium  Bread (whole wheat) - 1 slice has 70 mg of potassium  Broccoli (raw) -  cup has 145 mg of potassium  Cabbage -  cup has 150 mg of potassium  Carrots (cooked or raw) -  cup has 180 mg of potassium  Cauliflower (raw) -  cup has 150 mg of potassium  Celery (raw)  -  cup has 155 mg of potassium  Cereal, bran flakes -  cup has 120-150 mg of potassium  Cheese (cottage) -  cup has 110 mg of potassium  Cherries - 10 have 150 mg of potassium  Chocolate - 1 oz bar has 165 mg of potassium  Coffee (brewed) - 6 oz has 90 mg of potassium  Corn -  cup or 1 ear has 195 mg of potassium  Cucumbers -  cup has 80 mg of potassium  Egg - 1 large egg has 60 mg of potassium  Eggplant -  cup has 60 mg of potassium  Endive (raw) -  cup has 80 mg of potassium  English muffin - 1 has 65 mg of potassium  Fish (ocean perch) - 3 oz has 192 mg of potassium  Frankfurter, beef or pork - 1 has 75 mg of potassium  Fruit cocktail -  cup has 115 mg of potassium  Grape juice -  cup has 170 mg of potassium  Grapefruit -  fruit has 175 mg of potassium  Grapes -  cup has 155 mg of potassium  Greens: kale, turnip, collard -  cup has 110-150 mg of potassium  Ice cream or frozen yogurt (chocolate) -  cup has 175 mg of potassium  Ice cream or frozen yogurt (vanilla) -  cup has 120-150 mg of potassium  Lemons, limes - 1 each has 80  mg of potassium  Lettuce - 1 cup has 100 mg of potassium  Mixed vegetables -  cup has 150 mg of potassium  Mushrooms, raw -  cup has 110 mg of potassium  Nuts (walnuts, pecans, or macadamia) - 1 oz has 125 mg of potassium  Oatmeal -  cup has 80 mg of potassium  Okra -  cup has 110 mg of potassium  Onions -  cup has 120 mg of potassium  Peach - 1 has 185 mg of potassium  Peaches (canned) -  cup has 120 mg of potassium  Pears (canned) -  cup has 120 mg of potassium  Peas, green (frozen) -  cup has 90 mg of potassium  Peppers (Green) -  cup has 130 mg of potassium  Peppers (Red) -  cup has 160 mg of potassium  Pineapple juice -  cup has 165 mg of potassium  Pineapple (fresh or canned) -  cup has 100 mg of potassium  Plums - 1 has 105 mg of potassium  Pudding, vanilla -  cup has 150 mg of  potassium  Raspberries -  cup has 90 mg of potassium  Rhubarb -  cup has 115 mg of potassium  Rice, wild -  cup has 80 mg of potassium  Shrimp - 3 oz has 155 mg of potassium  Spinach (raw) - 1 cup has 170 mg of potassium  Strawberries -  cup has 125 mg of potassium  Summer squash -  cup has 175-200 mg of potassium  Swiss chard (raw) - 1 cup has 135 mg of potassium  Tangerines - 1 fruit has 140 mg of potassium  Tea, brewed - 6 oz has 65 mg of potassium  Turnips -  cup has 140 mg of potassium  Watermelon -  cup has 85 mg of potassium  Wine (Red, table) - 5 oz has 180 mg of potassium  Wine (White, table) - 5 oz 100 mg of potassium Low in potassium The following foods and beverages have less than 50 mg of potassium per serving.  Bread (white) - 1 slice has 30 mg of potassium  Carbonated beverages - 12 oz has less than 5 mg of potassium  Cheese - 1 oz has 20-30 mg of potassium  Cranberries -  cup has 45 mg of potassium  Cranberry juice cocktail -  cup has 20 mg of potassium  Fats and oils - 1 Tbsp has less than 5 mg of potassium  Hummus - 1 Tbsp has 32 mg of potassium  Nectar (papaya, mango, or pear) -  cup has 35 mg of potassium  Rice (white or brown) -  cup has 50 mg of potassium  Spaghetti or macaroni (cooked) -  cup has 30 mg of potassium  Tortilla, flour or corn - 1 has 50 mg of potassium  Waffle - 1 four-inch waffle has 50 mg of potassium  Water chestnuts -  cup has 40 mg of potassium Summary  Potassium is a mineral found in many foods and drinks. It affects how the heart works, and helps keep fluids and minerals balanced in the body.  The amount of potassium you need each day depends on your age and any existing medical conditions you may have. Your health care provider or dietitian may recommend an amount of potassium that you should have each day. This information is not intended to replace advice given to you by your health care  provider. Make sure you discuss any questions you  have with your health care provider. Document Released: 02/28/2005 Document Revised: 06/29/2017 Document Reviewed: 10/11/2016 Elsevier Patient Education  [place].

## 2019-05-23 ENCOUNTER — Other Ambulatory Visit: Payer: Self-pay

## 2019-05-23 ENCOUNTER — Ambulatory Visit (INDEPENDENT_AMBULATORY_CARE_PROVIDER_SITE_OTHER): Payer: Managed Care, Other (non HMO)

## 2019-05-23 ENCOUNTER — Ambulatory Visit (INDEPENDENT_AMBULATORY_CARE_PROVIDER_SITE_OTHER): Payer: Managed Care, Other (non HMO) | Admitting: Family Medicine

## 2019-05-23 ENCOUNTER — Encounter: Payer: Self-pay | Admitting: Family Medicine

## 2019-05-23 VITALS — BP 136/90 | HR 83 | Temp 98.5°F | Ht 64.0 in | Wt 160.0 lb

## 2019-05-23 DIAGNOSIS — R0600 Dyspnea, unspecified: Secondary | ICD-10-CM

## 2019-05-23 DIAGNOSIS — Z23 Encounter for immunization: Secondary | ICD-10-CM

## 2019-05-23 DIAGNOSIS — I10 Essential (primary) hypertension: Secondary | ICD-10-CM | POA: Diagnosis not present

## 2019-05-23 DIAGNOSIS — E1165 Type 2 diabetes mellitus with hyperglycemia: Secondary | ICD-10-CM | POA: Diagnosis not present

## 2019-05-23 DIAGNOSIS — R0609 Other forms of dyspnea: Secondary | ICD-10-CM

## 2019-05-23 DIAGNOSIS — R5383 Other fatigue: Secondary | ICD-10-CM

## 2019-05-23 NOTE — Patient Instructions (Signed)
° ° ° °  If you have lab work done today you will be contacted with your lab results within the next 2 weeks.  If you have not heard from us then please contact us. The fastest way to get your results is to register for My Chart. ° ° °IF you received an x-ray today, you will receive an invoice from [facility]. Please contact  Radiology at [phone] with questions or concerns regarding your invoice.  ° °IF you received labwork today, you will receive an invoice from [facility]. Please contact [facility] at [phone] with questions or concerns regarding your invoice.  ° °Our billing staff will not be able to assist you with questions regarding bills from these companies. ° °You will be contacted with the lab results as soon as they are available. The fastest way to get your results is to activate your My Chart account. Instructions are located on the last page of this paperwork. If you have not heard from us regarding the results in 2 weeks, please contact this office. °  ° ° ° °

## 2019-05-23 NOTE — Progress Notes (Signed)
10/23/20209:00 AM  Sandra Moody 1971-02-02, 48 y.o., female [id number]  Chief Complaint  Patient presents with  . Diabetes    says she has been extremely fatigue  . Hypertension    HPI:   Patient is a 48 y.o. female with past medical history significant for DM2, HTN, migraines, asthma, GERD, biliary dyskinesia,who presents today forfollowup  Last OV Oct 2020 for fatigue - no clear cause, did not want to have labs drawn again, push fluids  She just found out that she has dental abcess, has bone loss. Under dentist care, making her migraines worse  Having new onset DOE and orthopnea Cant lay down anymore as she feels pressure and SOB, no CP She is wheezing mostly at night, occ cough Takes Symbicort twice a day Albuterol helps but gives her palpitations No edema, fever or chills  Checks cbgs twice a day actos 78m daily - started July because she did not tolerate metformin at all 7 day avg 128, denies any lows No polydipsia or polyuria   Lab Results  Component Value Date   CREATININE 0.86 04/26/2019   BUN 7 04/26/2019   NA 140 04/26/2019   K 3.0 (L) 04/26/2019   CL 106 04/26/2019   CO2 26 04/26/2019   Lab Results  Component Value Date   WBC 6.8 04/26/2019   HGB 12.8 04/26/2019   HCT 41.1 04/26/2019   MCV 98.6 04/26/2019   PLT 345 04/26/2019   Lab Results  Component Value Date   TSH 1.560 11/18/2018   No results found for: VITAMINB12   Lab Results  Component Value Date   ANA Negative 09/17/2018   RF <10.0 09/17/2018   Lab Results  Component Value Date   CHOL 148 11/18/2018   HDL 30 (L) 11/18/2018   LDLCALC 84 11/18/2018   TRIG 170 (H) 11/18/2018   CHOLHDL 4.9 (H) 11/18/2018    Depression screen PHQ 2/9 05/23/2019 02/20/2019 01/28/2019  Decreased Interest 0 0 0  Down, Depressed, Hopeless 0 0 0  PHQ - 2 Score 0 0 0    Fall Risk  05/23/2019 02/20/2019 01/28/2019 12/25/2018 11/18/2018  Falls in the past year? 0 0 0 0 0  Number falls in past yr: 0  0 - - 0  Injury with Fall? 0 0 - - 0  Follow up - Falls evaluation completed - - -     Allergies  Allergen Reactions  . Ginger Itching and Swelling  . Passion Fruit Flavor [Flavoring Agent] Itching and Swelling  . Peanuts [Peanut Oil] Anaphylaxis    PT ALLERGIC TO ALL TREE NUTS  . Shellfish Allergy Anaphylaxis  . Tamiflu [Oseltamivir Phosphate]     Prior to Admission medications   Medication Sig Start Date End Date Taking? Authorizing Provider  ACCU-CHEK GUIDE test strip USE TO CHECK GLUCOSE TWICE DAILY,FASTING AND 2 HOURS AFTER DINNER 10/30/18  Yes [provider]  ALBUTEROL SULFATE PO Take by mouth.   Yes [provider]  amLODipine (NORVASC) 2.5 MG tablet TAKE 1 TABLET BY MOUTH EVERY DAY 04/19/19  Yes SRutherford Guys MD  blood glucose meter kit and supplies Per insurance preference. Use to check glucose twice a day (fasting and 2 hours after dinner)  Dx E11.65 10/30/18  Yes SRutherford Guys MD  EPIPEN 2-PAK 0.3 MG/0.3ML SOAJ injection Inject 0.3 mg into the muscle as needed (for allergic reaction).  05/18/16  Yes [provider]  fluticasone (FLONASE) 50 MCG/ACT nasal spray USE 1 2 SPRAYS IN  EACH NOSTRIL ONCE A DAY 10/25/18  Yes [provider]  imipramine (TOFRANIL-PM) 100 MG capsule Take 1 capsule (100 mg total) by mouth at bedtime. 04/30/19  Yes Kathrynn Ducking, MD  ketoconazole (NIZORAL) 2 % shampoo Apply 1 application topically 2 (two) times a week. Apply to affected area lather, leave in place for 5 mins and then rinse off with water 05/05/19  Yes Rutherford Guys, MD  levocetirizine (XYZAL) 5 MG tablet 1 2 TABLET IN THE EVENING ONCE A DAY ORALLY 15 11/05/18  Yes [provider]  medroxyPROGESTERone (DEPO-PROVERA) 150 MG/ML injection  08/15/18  Yes [provider]  montelukast (SINGULAIR) 10 MG tablet Take 10 mg by mouth at bedtime.   Yes [provider]  olopatadine (PATANOL) 0.1 % ophthalmic solution Place 1 drop into  both eyes 2 (two) times daily. 12/25/18  Yes Corum, Rex Kras, MD  pioglitazone (ACTOS) 30 MG tablet Take 1 tablet (30 mg total) by mouth daily. 02/20/19  Yes Rutherford Guys, MD  rizatriptan (MAXALT) 10 MG tablet Take 1 tablet (10 mg total) by mouth 3 (three) times daily as needed for migraine. 08/29/18  Yes Kathrynn Ducking, MD  SYMBICORT 160-4.5 MCG/ACT inhaler INHALE 2 PUFFS INTO THE LUNGS DAILY. 05/09/18  Yes Valentina Shaggy, MD  clindamycin (CLEOCIN) 150 MG capsule  05/22/19   [provider]  gabapentin (NEURONTIN) 100 MG capsule Take 2 capsules (200 mg total) by mouth 3 (three) times daily. 02/20/19 03/22/19  Rutherford Guys, MD  ibuprofen (ADVIL) 800 MG tablet Take 800 mg by mouth 4 (four) times daily as needed. 05/16/19   [provider]    Past Medical History:  Diagnosis Date  . Acute renal disease   . Asthma   . Bilateral carpal tunnel syndrome 03/11/2019  . Common migraine with intractable migraine 08/29/2018  . Daily headache    (03/08/2017)  . Essential hypertension 02/20/2019  . GERD (gastroesophageal reflux disease)   . H pylori ulcer ~ 08/2016  . History of hiatal hernia   . Leaky heart valve ~ 09/2016  . Migraine    "maybe weekly" (03/08/2017)  . PONV (postoperative nausea and vomiting)   . Type 2 diabetes mellitus with hyperglycemia, without long-term current use of insulin ([facility]) 02/20/2019    Past Surgical History:  Procedure Laterality Date  . CHOLECYSTECTOMY N/A 03/08/2017   Procedure: LAPAROSCOPIC CHOLECYSTECTOMY;  Surgeon: Coralie Keens, MD;  Location: [facility];  Service: General;  Laterality: N/A;  . DILATION AND CURETTAGE OF UTERUS    . LAPAROSCOPIC CHOLECYSTECTOMY  03/08/2017  . TUBAL LIGATION      Social History   Tobacco Use  . Smoking status: Never Smoker  . Smokeless tobacco: Never Used  Substance Use Topics  . Alcohol use: Yes    Comment: 03/08/2017 "might have a drink on a holiday"    Family History  Problem Relation Age of  Onset  . Thyroid disease Mother   . Diabetes Maternal Grandmother   . Heart disease Maternal Grandfather     ROS Per hpi  OBJECTIVE:  Today's Vitals   05/23/19 0848  BP: 136/90  Pulse: 83  Temp: 98.5 F (36.9 C)  SpO2: 98%  Weight: 160 lb (72.6 kg)  Height: '5\' 4"'$  (1.626 m)   Body mass index is 27.46 kg/m.   Physical Exam Vitals signs and nursing note reviewed.  Constitutional:      Appearance: She is well-developed.  HENT:     Head: Normocephalic  and atraumatic.     Mouth/Throat:     Pharynx: No oropharyngeal exudate.  Eyes:     General: No scleral icterus.    Conjunctiva/sclera: Conjunctivae normal.     Pupils: Pupils are equal, round, and reactive to light.  Neck:     Musculoskeletal: Neck supple.     Vascular: No hepatojugular reflux or JVD.  Cardiovascular:     Rate and Rhythm: Normal rate and regular rhythm.     Pulses: Normal pulses.     Heart sounds: Murmur present. No friction rub. No gallop.   Pulmonary:     Effort: Pulmonary effort is normal.     Breath sounds: Normal breath sounds. No wheezing, rhonchi or rales.  Abdominal:     General: There is no distension.     Palpations: Abdomen is soft. There is no hepatomegaly or splenomegaly.     Tenderness: There is no abdominal tenderness.  Musculoskeletal:     Right lower leg: No edema.     Left lower leg: No edema.  Lymphadenopathy:     Cervical: No cervical adenopathy.  Skin:    General: Skin is warm and dry.  Neurological:     Mental Status: She is alert and oriented to person, place, and time.     No results found for this or any previous visit (from the past 24 hour(s)).  Dg Chest 2 View  Result Date: 05/23/2019 CLINICAL DATA:  Shortness of breath with exertion EXAM: CHEST - 2 VIEW COMPARISON:  08/22/2017 FINDINGS: Heart and mediastinal contours are within normal limits. No focal opacities or effusions. No acute bony abnormality. IMPRESSION: No active cardiopulmonary disease.  Electronically Signed   By: Rolm Baptise M.D.   On: 05/23/2019 09:29   My interpretation of EKG:  NSR, HR 104, normal intervals   ASSESSMENT and PLAN  1. DOE (dyspnea on exertion) Normal ekg and CXR, Echo pending. Patient will be scheduling appt with pulm for eval of asthma - Brain natriuretic peptide - EKG 12-Lead - DG Chest 2 View; Future - ECHOCARDIOGRAM COMPLETE; Future  2. Fatigue, unspecified type Cardiac and pulm eval underway. Labs for other causes pending. - TSH - Vitamin D, 25-hydroxy - Vitamin B12  3. Type 2 diabetes mellitus with hyperglycemia, without long-term current use of insulin ([facility]) Last a1c at goal, home cbgs at goal, cont actos for now, if workup reveals CHF then will need to dc. - Comprehensive metabolic panel  4. Essential hypertension Controlled. Continue current regime.   5. Need for prophylactic vaccination and inoculation against influenza  Other orders   Return for after echo.    Rutherford Guys, MD [facility] [address], [place] [address] Ph.  [phone] Fax [phone]

## 2019-05-24 LAB — COMPREHENSIVE METABOLIC PANEL
ALT: 11 IU/L (ref 0–32)
AST: 15 IU/L (ref 0–40)
Albumin/Globulin Ratio: 1.6 (ref 1.2–2.2)
Albumin: 4.2 g/dL (ref 3.8–4.8)
Alkaline Phosphatase: 91 IU/L (ref 39–117)
BUN/Creatinine Ratio: 7 — ABNORMAL LOW (ref 9–23)
BUN: 7 mg/dL (ref 6–24)
Bilirubin Total: 0.3 mg/dL (ref 0.0–1.2)
CO2: 23 mmol/L (ref 20–29)
Calcium: 9.4 mg/dL (ref 8.7–10.2)
Chloride: 104 mmol/L (ref 96–106)
Creatinine, Ser: 1 mg/dL (ref 0.57–1.00)
GFR calc Af Amer: 78 mL/min/{1.73_m2} (ref >59)
GFR calc non Af Amer: 68 mL/min/{1.73_m2} (ref >59)
Globulin, Total: 2.7 g/dL (ref 1.5–4.5)
Glucose: 105 mg/dL — ABNORMAL HIGH (ref 65–99)
Potassium: 3.8 mmol/L (ref 3.5–5.2)
Sodium: 142 mmol/L (ref 134–144)
Total Protein: 6.9 g/dL (ref 6.0–8.5)

## 2019-05-24 LAB — BRAIN NATRIURETIC PEPTIDE: BNP: 2.9 pg/mL (ref 0.0–100.0)

## 2019-05-24 LAB — VITAMIN D 25 HYDROXY (VIT D DEFICIENCY, FRACTURES): Vit D, 25-Hydroxy: 33.8 ng/mL (ref 30.0–100.0)

## 2019-05-24 LAB — VITAMIN B12: Vitamin B-12: 724 pg/mL (ref 232–1245)

## 2019-05-24 LAB — TSH: TSH: 1.59 u[IU]/mL (ref 0.450–4.500)

## 2019-05-28 ENCOUNTER — Encounter (HOSPITAL_COMMUNITY): Payer: Self-pay | Admitting: Family Medicine

## 2019-05-30 ENCOUNTER — Other Ambulatory Visit: Payer: Self-pay

## 2019-05-30 ENCOUNTER — Encounter (HOSPITAL_COMMUNITY): Payer: Self-pay

## 2019-05-30 ENCOUNTER — Ambulatory Visit (HOSPITAL_COMMUNITY)
Admission: EM | Admit: 2019-05-30 | Discharge: 2019-05-30 | Disposition: A | Discharge status: Home / Self Care | Payer: Managed Care, Other (non HMO) | Attending: Family Medicine | Admitting: Family Medicine

## 2019-05-30 ENCOUNTER — Other Ambulatory Visit: Payer: Self-pay | Admitting: Neurology

## 2019-05-30 DIAGNOSIS — K047 Periapical abscess without sinus: Secondary | ICD-10-CM | POA: Diagnosis not present

## 2019-05-30 MED ORDER — AMOXICILLIN-POT CLAVULANATE 875-125 MG PO TABS: 1.0000 | ORAL_TABLET | Freq: Two times a day (BID) | ORAL | 1 refills | Status: DC

## 2019-05-30 MED ORDER — CHLORHEXIDINE GLUCONATE 0.12 % MT SOLN: 15.0000 mL | Freq: Two times a day (BID) | OROMUCOSAL | 1 refills | Status: DC

## 2019-05-30 NOTE — Discharge Instructions (Addendum)
Take antibiotic 2 times a day Rinse with Peridex twice a day, swish and spit Take ibuprofen or Aleve for moderate pain Take your pain medicine for severe pain Follow-up with your dentist

## 2019-05-30 NOTE — ED Triage Notes (Signed)
Pt presents to UC w/ c/o hypertension today in the 140's/90's. Pt also states she has abscess on lower right side of teeth x3 weeks. Pt states she has been to dentist twice and gave her antibiotics both times that did not help. Pt states she has headaches from abscess.

## 2019-05-30 NOTE — ED Provider Notes (Signed)
[facility]    CSN: [id number] Arrival date & time: 05/30/19  1659      History   Chief Complaint Chief Complaint  Patient presents with  . Hypertension; tooth abscess    HPI Sandra Moody is a 48 y.o. female.   HPI  Patient has recurring dental abscess.  It is at the site of a dental implant.  She had x-rays and was told that the dental implant is being held in place by soft tissues and that the bone underneath is eroded.  She has been referred by her dentist to periodontist but is not but yet been able to see him.  She had a course of penicillin.  The infection came back so she had a course of clindamycin.  Now, finishing the clindamycin, she has a recurring abscess.  She states it is very painful.  She is frustrated with recurring abscesses.  No fever or chills.  Past Medical History:  Diagnosis Date  . Acute renal disease   . Asthma   . Bilateral carpal tunnel syndrome 03/11/2019  . Common migraine with intractable migraine 08/29/2018  . Daily headache    (03/08/2017)  . Essential hypertension 02/20/2019  . GERD (gastroesophageal reflux disease)   . H pylori ulcer ~ 08/2016  . History of hiatal hernia   . Leaky heart valve ~ 09/2016  . Migraine    "maybe weekly" (03/08/2017)  . PONV (postoperative nausea and vomiting)   . Type 2 diabetes mellitus with hyperglycemia, without long-term current use of insulin ([facility]) 02/20/2019    Patient Active Problem List   Diagnosis Date Noted  . Trigger finger of left thumb 04/30/2019  . Infection of flexor tendon sheath 04/15/2019  . Carpal tunnel syndrome, bilateral 03/11/2019  . Essential hypertension 02/20/2019  . Type 2 diabetes mellitus with hyperglycemia, without long-term current use of insulin ([facility]) 02/20/2019  . Other acute sinusitis 12/25/2018  . Irritable bowel syndrome 12/04/2018  . Migraine with aura 12/04/2018  . Common migraine with intractable migraine 08/29/2018  . Moderate persistent asthma without  complication 11/57/2620  . Seasonal and perennial allergic rhinitis 06/28/2017  . Mild persistent asthma with acute exacerbation 06/28/2017  . Gastroesophageal reflux disease 06/28/2017  . Biliary dyskinesia 03/08/2017  . Asthma 03/02/2016  . Asthma with acute exacerbation 10/04/2015  . Other and unspecified ovarian cyst 06/17/2012    Past Surgical History:  Procedure Laterality Date  . CHOLECYSTECTOMY N/A 03/08/2017   Procedure: LAPAROSCOPIC CHOLECYSTECTOMY;  Surgeon: Coralie Keens, MD;  Location: [facility];  Service: General;  Laterality: N/A;  . DILATION AND CURETTAGE OF UTERUS    . LAPAROSCOPIC CHOLECYSTECTOMY  03/08/2017  . TUBAL LIGATION      OB History    Gravida  5   Para  3   Term  3   Preterm  0   AB  2   Living        SAB  1   TAB  1   Ectopic  0   Multiple      Live Births               Home Medications    Prior to Admission medications   Medication Sig Start Date End Date Taking? Authorizing Provider  rizatriptan (MAXALT) 10 MG tablet Take 10 mg by mouth as needed for migraine. May repeat in 2 hours if needed   Yes [provider]  ACCU-CHEK GUIDE test strip USE TO CHECK GLUCOSE TWICE DAILY,FASTING AND 2  HOURS AFTER DINNER 10/30/18   [provider]  ALBUTEROL SULFATE PO Take by mouth.    [provider]  amLODipine (NORVASC) 2.5 MG tablet TAKE 1 TABLET BY MOUTH EVERY DAY 04/19/19   Rutherford Guys, MD  amoxicillin-clavulanate (AUGMENTIN) 875-125 MG tablet Take 1 tablet by mouth every 12 (twelve) hours. 05/30/19   Raylene Everts, MD  blood glucose meter kit and supplies Per insurance preference. Use to check glucose twice a day (fasting and 2 hours after dinner)  Dx E11.65 10/30/18   Rutherford Guys, MD  chlorhexidine (PERIDEX) 0.12 % solution Use as directed 15 mLs in the mouth or throat 2 (two) times daily. 05/30/19   Raylene Everts, MD  clindamycin (CLEOCIN) 150 MG capsule  05/22/19   [provider]   EPIPEN 2-PAK 0.3 MG/0.3ML SOAJ injection Inject 0.3 mg into the muscle as needed (for allergic reaction).  05/18/16   [provider]  fluticasone (FLONASE) 50 MCG/ACT nasal spray USE 1 2 SPRAYS IN EACH NOSTRIL ONCE A DAY 10/25/18   [provider]  gabapentin (NEURONTIN) 100 MG capsule Take 2 capsules (200 mg total) by mouth 3 (three) times daily. 02/20/19 03/22/19  Rutherford Guys, MD  ibuprofen (ADVIL) 800 MG tablet Take 800 mg by mouth 4 (four) times daily as needed. 05/16/19   [provider]  imipramine (TOFRANIL-PM) 100 MG capsule Take 1 capsule (100 mg total) by mouth at bedtime. 04/30/19   Kathrynn Ducking, MD  ketoconazole (NIZORAL) 2 % shampoo Apply 1 application topically 2 (two) times a week. Apply to affected area lather, leave in place for 5 mins and then rinse off with water 05/05/19   Rutherford Guys, MD  levocetirizine (XYZAL) 5 MG tablet 1 2 TABLET IN THE EVENING ONCE A DAY ORALLY 15 11/05/18   [provider]  medroxyPROGESTERone (DEPO-PROVERA) 150 MG/ML injection  08/15/18   [provider]  montelukast (SINGULAIR) 10 MG tablet Take 10 mg by mouth at bedtime.    [provider]  olopatadine (PATANOL) 0.1 % ophthalmic solution Place 1 drop into both eyes 2 (two) times daily. 12/25/18   Corum, Rex Kras, MD  pioglitazone (ACTOS) 30 MG tablet Take 1 tablet (30 mg total) by mouth daily. 02/20/19   Rutherford Guys, MD  rizatriptan (MAXALT) 10 MG tablet Take 1 tablet (10 mg total) by mouth 3 (three) times daily as needed for migraine. 08/29/18   Kathrynn Ducking, MD  SYMBICORT 160-4.5 MCG/ACT inhaler INHALE 2 PUFFS INTO THE LUNGS DAILY. 05/09/18   Valentina Shaggy, MD    Family History Family History  Problem Relation Age of Onset  . Thyroid disease Mother   . Diabetes Maternal Grandmother   . Heart disease Maternal Grandfather     Social History Social History   Tobacco Use  . Smoking status: Never Smoker  . Smokeless  tobacco: Never Used  Substance Use Topics  . Alcohol use: Yes    Comment: 03/08/2017 "might have a drink on a holiday"  . Drug use: No     Allergies   Ginger, Passion fruit flavor [flavoring agent], Peanuts [peanut oil], Shellfish allergy, and Tamiflu [oseltamivir phosphate]   Review of Systems Review of Systems  Constitutional: Negative for chills and fever.  HENT: Positive for dental problem. Negative for ear pain and sore throat.   Eyes: Negative for pain and visual disturbance.  Respiratory: Negative for cough and shortness of breath.   Cardiovascular: Negative for  chest pain and palpitations.  Gastrointestinal: Negative for abdominal pain and vomiting.  Genitourinary: Negative for dysuria and hematuria.  Musculoskeletal: Negative for arthralgias and back pain.  Skin: Negative for color change and rash.  Neurological: Negative for seizures and syncope.  All other systems reviewed and are negative.    Physical Exam Triage Vital Signs ED Triage Vitals  Enc Vitals Group     BP 05/30/19 1753 (!) 144/86     Pulse Rate 05/30/19 1753 96     Resp 05/30/19 1753 16     Temp 05/30/19 1753 98.7 F (37.1 C)     Temp Source 05/30/19 1753 Oral     SpO2 05/30/19 1753 100 %     Weight --      Height --      Head Circumference --      Peak Flow --      Pain Score 05/30/19 1756 10     Pain Loc --      Pain Edu? --      Excl. in [place]? --    No data found.  Updated Vital Signs BP (!) 144/86 (BP Location: Right Arm)   Pulse 96   Temp 98.7 F (37.1 C) (Oral)   Resp 16   SpO2 100%      Physical Exam Constitutional:      General: She is not in acute distress.    Appearance: She is well-developed and normal weight.     Comments: Obvious discomfort  HENT:     Head: Normocephalic and atraumatic.     Mouth/Throat:   Eyes:     Conjunctiva/sclera: Conjunctivae normal.     Pupils: Pupils are equal, round, and reactive to light.  Neck:     Musculoskeletal: Normal range of  motion.  Cardiovascular:     Rate and Rhythm: Normal rate.  Pulmonary:     Effort: Pulmonary effort is normal. No respiratory distress.  Abdominal:     General: There is no distension.     Palpations: Abdomen is soft.  Musculoskeletal: Normal range of motion.  Skin:    General: Skin is warm and dry.  Neurological:     Mental Status: She is alert.      UC Treatments / Results  Labs (all labs ordered are listed, but only abnormal results are displayed) Labs Reviewed - No data to display  EKG   Radiology No results found.  Procedures Procedures (including critical care time)  Medications Ordered in UC Medications - No data to display  Initial Impression / Assessment and Plan / UC Course  I have reviewed the triage vital signs and the nursing notes.  Pertinent labs & imaging results that were available during my care of the patient were reviewed by me and considered in my medical decision making (see chart for details).     Patient states that her blood pressure is elevated but I think it is because of the pain and frustration of having her dental infection.  I told her to stop I need to be dangerous and I recommend that she follow it along over time. I explained that each of the antibiotics probably help the abscess, with recurring because of a defect in the tooth and not because the antibiotic was not strong enough.  Having said that, will try Augmentin to see if this gives her better relief.  I am giving her a refill in case she needs it before she can see the.  Dentist. Final Clinical  Impressions(s) / UC Diagnoses   Final diagnoses:  Dental abscess     Discharge Instructions     Take antibiotic 2 times a day Rinse with Peridex twice a day, swish and spit Take ibuprofen or Aleve for moderate pain Take your pain medicine for severe pain Follow-up with your dentist   ED Prescriptions    Medication Sig Dispense Auth. Provider   amoxicillin-clavulanate  (AUGMENTIN) 875-125 MG tablet Take 1 tablet by mouth every 12 (twelve) hours. 14 tablet Raylene Everts, MD   chlorhexidine (PERIDEX) 0.12 % solution Use as directed 15 mLs in the mouth or throat 2 (two) times daily. 120 mL Raylene Everts, MD     I have reviewed the PDMP during this encounter.   Raylene Everts, MD 05/30/19 [phone]

## 2019-06-03 ENCOUNTER — Other Ambulatory Visit: Payer: Self-pay | Admitting: Family Medicine

## 2019-06-03 ENCOUNTER — Telehealth (HOSPITAL_COMMUNITY): Payer: Self-pay

## 2019-06-03 NOTE — Telephone Encounter (Signed)
New message    Just an FYI. We have made several attempts to contact this patient including sending a letter to schedule or reschedule their echocardiogram. We will be removing the patient from the echo WQ.   11.3.20 @ 3:49pm lm on home vm  - Letia Guidry  10.28.20 mail reminder letter Jari Sportsman   10.23.20 @ 10:53am lm on home vm  - Haden Cavenaugh

## 2019-06-03 NOTE — Telephone Encounter (Signed)
Can you please help with this?  Thanks.

## 2019-06-13 ENCOUNTER — Ambulatory Visit (HOSPITAL_COMMUNITY): Payer: Managed Care, Other (non HMO) | Attending: Cardiovascular Disease

## 2019-06-13 ENCOUNTER — Other Ambulatory Visit: Payer: Self-pay

## 2019-06-13 DIAGNOSIS — R0600 Dyspnea, unspecified: Secondary | ICD-10-CM | POA: Insufficient documentation

## 2019-06-13 DIAGNOSIS — R0609 Other forms of dyspnea: Secondary | ICD-10-CM

## 2019-06-19 ENCOUNTER — Encounter: Payer: Self-pay | Admitting: Critical Care Medicine

## 2019-06-19 ENCOUNTER — Ambulatory Visit (INDEPENDENT_AMBULATORY_CARE_PROVIDER_SITE_OTHER): Payer: Managed Care, Other (non HMO) | Admitting: Critical Care Medicine

## 2019-06-19 ENCOUNTER — Other Ambulatory Visit: Payer: Self-pay

## 2019-06-19 VITALS — BP 126/78 | HR 97 | Temp 98.0°F | Ht 64.57 in | Wt 162.8 lb

## 2019-06-19 DIAGNOSIS — J4540 Moderate persistent asthma, uncomplicated: Secondary | ICD-10-CM

## 2019-06-19 DIAGNOSIS — R079 Chest pain, unspecified: Secondary | ICD-10-CM | POA: Diagnosis not present

## 2019-06-19 DIAGNOSIS — Z23 Encounter for immunization: Secondary | ICD-10-CM

## 2019-06-19 DIAGNOSIS — R0789 Other chest pain: Secondary | ICD-10-CM | POA: Diagnosis not present

## 2019-06-19 NOTE — Progress Notes (Signed)
Synopsis: Referred in November 2020 for chest tightness by self.  Subjective:   PATIENT ID: Sandra Moody GENDER: female DOB: September 10, 1970, MRN: [id number]  Chief Complaint  Patient presents with   Consult    Patient is here today because she is having chest pain and shortness of breath when laying down.      Sandra Moody is a 48 year old woman with a history of allergies and asthma since childhood who presents for evaluation of fatigue and substernal and left-sided chest pain.  She feels that her wheezing has been worse for the past few months despite taking her Symbicort and albuterol.  She has discussed this with her allergist (Dr. Donneta Romberg), who felt that this was unlikely due to her asthma and did not recommend nebulized breathing treatments.  Her wheezing is worse at night.  She has not been missing doses of Symbicort.  She has never required hospitalization for her asthma.  She has chronic allergies which she treats with Flonase, montelukast, levocetirizine, and allergy eyedrops.  She has had occasional coughing, which is not a typical asthma symptom for her.  She describes her chest pain as being substernal and left side, occasionally feeling like a shock, but sometimes feels like there is a brick on her chest and sharp.  The pain is worse when laying flat, and improves with sitting up.  Otherwise nothing improves the pain.  The pain is periodic rather than constant, and resolves on its own.  She is not sure what caused the symptoms to develop initially, but they have become more frequent over time.  She has no personal history of cardiac disease, and had a grandfather who had heart disease.  She does not smoke or vape.  She has had GERD in the past, but her symptoms were different.  She has never had symptoms like this before.  She started a new medicine shortly before this began, pioglitazone for her diabetes.  No medications have been stopped.  There is no personal or family history of  rheumatologic disease or DVT.  She has had 1 miscarriage in the past early in the pregnancy.  In the last few weeks she has required antibiotics twice-once for a thumb infection, once for a tooth abscess.  The tooth was pulled last week.     Past Medical History:  Diagnosis Date   Acute renal disease    Asthma    Bilateral carpal tunnel syndrome 03/11/2019   Common migraine with intractable migraine 08/29/2018   Daily headache    (03/08/2017)   Essential hypertension 02/20/2019   GERD (gastroesophageal reflux disease)    H pylori ulcer ~ 08/2016   History of hiatal hernia    Leaky heart valve ~ 09/2016   Migraine    "maybe weekly" (03/08/2017)   PONV (postoperative nausea and vomiting)    Type 2 diabetes mellitus with hyperglycemia, without long-term current use of insulin ([place]) 02/20/2019     Family History  Problem Relation Age of Onset   Thyroid disease Mother    Diabetes Maternal Grandmother    Heart disease Maternal Grandfather      Past Surgical History:  Procedure Laterality Date   CHOLECYSTECTOMY N/A 03/08/2017   Procedure: LAPAROSCOPIC CHOLECYSTECTOMY;  Surgeon: Coralie Keens, MD;  Location: [facility];  Service: General;  Laterality: N/A;   DILATION AND CURETTAGE OF UTERUS     LAPAROSCOPIC CHOLECYSTECTOMY  03/08/2017   TUBAL LIGATION      Social History   Socioeconomic History  Marital status: Significant Other    Spouse name: Not on file   Number of children: 3   Years of education: Not on file   Highest education level: 12th grade  Occupational History    Comment: [employer]   Social Needs   Financial resource strain: Not on file   Food insecurity    Worry: Not on file    Inability: Not on file   Transportation needs    Medical: Not on file    Non-medical: Not on file  Tobacco Use   Smoking status: Never Smoker   Smokeless tobacco: Never Used  Substance and Sexual Activity   Alcohol use: Yes    Comment: 03/08/2017  "might have a drink on a holiday"   Drug use: No   Sexual activity: Yes    Partners: Male    Birth control/protection: Injection  Lifestyle   Physical activity    Days per week: Not on file    Minutes per session: Not on file   Stress: Not on file  Relationships   Social connections    Talks on phone: Not on file    Gets together: Not on file    Attends religious service: Not on file    Active member of club or organization: Not on file    Attends meetings of clubs or organizations: Not on file    Relationship status: Not on file   Intimate partner violence    Fear of current or ex partner: Not on file    Emotionally abused: Not on file    Physically abused: Not on file    Forced sexual activity: Not on file  Other Topics Concern   Not on file  Social History Narrative   ** Merged History Encounter **   Right handed    Lives at home with significant other 2 cups daily of caffeine      Allergies  Allergen Reactions   Ginger Itching and Swelling   Passion Fruit Flavor [Flavoring Agent] Itching and Swelling   Peanuts [Peanut Oil] Anaphylaxis    PT ALLERGIC TO ALL TREE NUTS   Shellfish Allergy Anaphylaxis   Tamiflu [Oseltamivir Phosphate]      Immunization History  Administered Date(s) Administered   Influenza,inj,Quad PF,6+ Mos 06/19/2019   Pneumococcal Polysaccharide-23 03/09/2017    Outpatient Medications Prior to Visit  Medication Sig Dispense Refill   ACCU-CHEK GUIDE test strip USE TO CHECK GLUCOSE TWICE DAILY,FASTING AND 2 HOURS AFTER DINNER     ALBUTEROL SULFATE PO Take by mouth.     amLODipine (NORVASC) 2.5 MG tablet TAKE 1 TABLET BY MOUTH EVERY DAY 30 tablet 2   amoxicillin (AMOXIL) 500 MG capsule Take 500 mg by mouth 3 (three) times daily.     blood glucose meter kit and supplies Per insurance preference. Use to check glucose twice a day (fasting and 2 hours after dinner)  Dx E11.65 1 each 5   chlorhexidine (PERIDEX) 0.12 % solution Use  as directed 15 mLs in the mouth or throat 2 (two) times daily. 120 mL 1   EPIPEN 2-PAK 0.3 MG/0.3ML SOAJ injection Inject 0.3 mg into the muscle as needed (for allergic reaction).   2   fluticasone (FLONASE) 50 MCG/ACT nasal spray USE 1 2 SPRAYS IN EACH NOSTRIL ONCE A DAY     gabapentin (NEURONTIN) 100 MG capsule TAKE 2 CAPSULES (200 MG TOTAL) BY MOUTH 3 (THREE) TIMES DAILY. 150 capsule 2   ibuprofen (ADVIL) 800 MG tablet Take 800  mg by mouth 4 (four) times daily as needed.     imipramine (TOFRANIL-PM) 100 MG capsule Take 1 capsule (100 mg total) by mouth at bedtime. 90 capsule 3   ketoconazole (NIZORAL) 2 % shampoo Apply 1 application topically 2 (two) times a week. Apply to affected area lather, leave in place for 5 mins and then rinse off with water 120 mL 5   levocetirizine (XYZAL) 5 MG tablet 1 2 TABLET IN THE EVENING ONCE A DAY ORALLY 15     medroxyPROGESTERone (DEPO-PROVERA) 150 MG/ML injection      montelukast (SINGULAIR) 10 MG tablet Take 10 mg by mouth at bedtime.     olopatadine (PATANOL) 0.1 % ophthalmic solution Place 1 drop into both eyes 2 (two) times daily. 5 mL 12   pioglitazone (ACTOS) 30 MG tablet Take 1 tablet (30 mg total) by mouth daily. 90 tablet 2   rizatriptan (MAXALT) 10 MG tablet Take 10 mg by mouth as needed for migraine. May repeat in 2 hours if needed     rizatriptan (MAXALT) 10 MG tablet TAKE 1 TABLET (10 MG TOTAL) BY MOUTH 3 (THREE) TIMES DAILY AS NEEDED FOR MIGRAINE. 6 tablet 6   SYMBICORT 160-4.5 MCG/ACT inhaler INHALE 2 PUFFS INTO THE LUNGS DAILY. 10.2 Inhaler 0   amoxicillin-clavulanate (AUGMENTIN) 875-125 MG tablet Take 1 tablet by mouth every 12 (twelve) hours. 14 tablet 1   clindamycin (CLEOCIN) 150 MG capsule      No facility-administered medications prior to visit.     Review of Systems  Constitutional: Positive for chills and malaise/fatigue. Negative for fever and weight loss.  HENT: Negative.   Respiratory: Positive for wheezing.  Negative for cough.   Cardiovascular: Negative for chest pain and leg swelling.  Gastrointestinal: Positive for diarrhea and nausea.  Genitourinary: Negative.   Musculoskeletal: Negative for joint pain and myalgias.       L chest pain  Skin: Negative for rash.  Endo/Heme/Allergies: Positive for environmental allergies.     Objective:   Vitals:   06/19/19 1515  BP: 126/78  Pulse: 97  Temp: 98 F (36.7 C)  TempSrc: Temporal  SpO2: 99%  Weight: 162 lb 12.8 oz (73.8 kg)  Height: 5' 4.57" (1.64 m)   99% on   RA BMI Readings from Last 3 Encounters:  06/19/19 27.46 kg/m  05/23/19 27.46 kg/m  05/02/19 27.46 kg/m   Wt Readings from Last 3 Encounters:  06/19/19 162 lb 12.8 oz (73.8 kg)  05/23/19 160 lb (72.6 kg)  05/02/19 160 lb (72.6 kg)    Physical Exam Vitals signs reviewed.  Constitutional:      General: She is not in acute distress.    Appearance: She is not ill-appearing or diaphoretic.     Comments: Fatigued appearing  HENT:     Head: Normocephalic and atraumatic.     Nose:     Comments: Deferred due to masking requirement.    Mouth/Throat:     Comments: Deferred due to masking requirement. Eyes:     General: No scleral icterus. Neck:     Musculoskeletal: Neck supple.  Cardiovascular:     Rate and Rhythm: Normal rate and regular rhythm.     Heart sounds: No murmur. No friction rub.  Pulmonary:     Comments: Breathing comfortably on room air, no conversational dyspnea or tachypnea.  Clear to auscultation bilaterally.  No coughing with deep inspiration. Abdominal:     General: There is no distension.     Palpations: Abdomen  is soft.     Tenderness: There is no abdominal tenderness.  Musculoskeletal:        General: No swelling or deformity.  Lymphadenopathy:     Cervical: No cervical adenopathy.  Skin:    General: Skin is warm and dry.     Findings: No rash.  Neurological:     Mental Status: She is alert.  Psychiatric:        Mood and Affect: Mood  normal.        Behavior: Behavior normal.      CBC    Component Value Date/Time   WBC 6.8 04/26/2019 2253   RBC 4.17 04/26/2019 2253   HGB 12.8 04/26/2019 2253   HGB 13.0 01/28/2019 1416   HCT 41.1 04/26/2019 2253   HCT 38.6 01/28/2019 1416   PLT 345 04/26/2019 2253   PLT 378 01/28/2019 1416   MCV 98.6 04/26/2019 2253   MCV 92 01/28/2019 1416   MCH 30.7 04/26/2019 2253   MCHC 31.1 04/26/2019 2253   RDW 13.2 04/26/2019 2253   RDW 12.6 01/28/2019 1416   LYMPHSABS 2.5 04/26/2019 2253   LYMPHSABS 2.6 01/28/2019 1416   MONOABS 0.7 04/26/2019 2253   EOSABS 0.0 04/26/2019 2253   EOSABS 0.1 01/28/2019 1416   BASOSABS 0.0 04/26/2019 2253   BASOSABS 0.0 01/28/2019 1416    CMP     Component Value Date/Time   NA 142 05/23/2019 0919   K 3.8 05/23/2019 0919   CL 104 05/23/2019 0919   CO2 23 05/23/2019 0919   GLUCOSE 105 (H) 05/23/2019 0919   GLUCOSE 124 (H) 04/26/2019 2253   BUN 7 05/23/2019 0919   CREATININE 1.00 05/23/2019 0919   CALCIUM 9.4 05/23/2019 0919   PROT 6.9 05/23/2019 0919   ALBUMIN 4.2 05/23/2019 0919   AST 15 05/23/2019 0919   ALT 11 05/23/2019 0919   ALKPHOS 91 05/23/2019 0919   BILITOT 0.3 05/23/2019 0919   GFRNONAA 68 05/23/2019 0919   GFRAA 78 05/23/2019 0919     Chest Imaging- films reviewed: CXR, 2 view 05/23/2019-no opacities  Pulmonary Functions Testing Results: No flowsheet data found.   Echocardiogram: LVEF 55 to 94%, normal diastolic function..  Normal LA, RV, RA.  Trivial TR, otherwise normal valves.  EKG 06/19/19: Normal sinus rhythm, normal axis, normal intervals.  Early R wave progression across precordials, including R waves in V1.  T wave inversion in lead III, similar to previous EKG from October 2020 in January 2019.  No other ST segment deviation, T wave inversion, or deviation of PR interval.      Assessment & Plan:     ICD-10-CM   1. Chest tightness  R07.89 Pulmonary function test    C-reactive protein    Sed Rate (ESR)     IgE    CBC w/Diff    EKG 12-Lead    CBC w/Diff    IgE    Sed Rate (ESR)    C-reactive protein  2. Moderate persistent asthma without complication  W54.62 IgE    CBC w/Diff    CBC w/Diff    IgE  3. Chest pain, unspecified type  R07.9   4. Need for immunization against influenza  Z23 Flu Vaccine QUAD 36+ mos IM    Atypical chest pain-substernal and left-sided.  Her symptoms sound suspicious for pericarditis, although her recent echocardiogram did not demonstrate a pericardial effusion and her EKG does not suggest this diagnosis either. -EKG today in the office -Checking CRP and ESR -We  will defer additional imaging until inflammatory markers return   History of allergic asthma, follows with Dr. Donneta Romberg the allergy clinic -Agree with her current regimen of Symbicort twice daily-not wheezing on exam today. -Albuterol as needed -Continue montelukast, levocetirizine, Flonase -I do not think that she would benefit from steroids at this point without strong suspicion for asthma causing her symptoms, which seems less likely with unilateral symptoms. -Checking CBC with differential and IgE level -PFTs -Flu shot today -Continue Covid precautions-handwashing, social distancing, mask wearing   RTC in 1 month.   Current Outpatient Medications:    ACCU-CHEK GUIDE test strip, USE TO CHECK GLUCOSE TWICE DAILY,FASTING AND 2 HOURS AFTER DINNER, Disp: , Rfl:    ALBUTEROL SULFATE PO, Take by mouth., Disp: , Rfl:    amLODipine (NORVASC) 2.5 MG tablet, TAKE 1 TABLET BY MOUTH EVERY DAY, Disp: 30 tablet, Rfl: 2   amoxicillin (AMOXIL) 500 MG capsule, Take 500 mg by mouth 3 (three) times daily., Disp: , Rfl:    blood glucose meter kit and supplies, Per insurance preference. Use to check glucose twice a day (fasting and 2 hours after dinner)  Dx E11.65, Disp: 1 each, Rfl: 5   chlorhexidine (PERIDEX) 0.12 % solution, Use as directed 15 mLs in the mouth or throat 2 (two) times daily., Disp: 120  mL, Rfl: 1   EPIPEN 2-PAK 0.3 MG/0.3ML SOAJ injection, Inject 0.3 mg into the muscle as needed (for allergic reaction). , Disp: , Rfl: 2   fluticasone (FLONASE) 50 MCG/ACT nasal spray, USE 1 2 SPRAYS IN EACH NOSTRIL ONCE A DAY, Disp: , Rfl:    gabapentin (NEURONTIN) 100 MG capsule, TAKE 2 CAPSULES (200 MG TOTAL) BY MOUTH 3 (THREE) TIMES DAILY., Disp: 150 capsule, Rfl: 2   ibuprofen (ADVIL) 800 MG tablet, Take 800 mg by mouth 4 (four) times daily as needed., Disp: , Rfl:    imipramine (TOFRANIL-PM) 100 MG capsule, Take 1 capsule (100 mg total) by mouth at bedtime., Disp: 90 capsule, Rfl: 3   ketoconazole (NIZORAL) 2 % shampoo, Apply 1 application topically 2 (two) times a week. Apply to affected area lather, leave in place for 5 mins and then rinse off with water, Disp: 120 mL, Rfl: 5   levocetirizine (XYZAL) 5 MG tablet, 1 2 TABLET IN THE EVENING ONCE A DAY ORALLY 15, Disp: , Rfl:    medroxyPROGESTERone (DEPO-PROVERA) 150 MG/ML injection, , Disp: , Rfl:    montelukast (SINGULAIR) 10 MG tablet, Take 10 mg by mouth at bedtime., Disp: , Rfl:    olopatadine (PATANOL) 0.1 % ophthalmic solution, Place 1 drop into both eyes 2 (two) times daily., Disp: 5 mL, Rfl: 12   pioglitazone (ACTOS) 30 MG tablet, Take 1 tablet (30 mg total) by mouth daily., Disp: 90 tablet, Rfl: 2   rizatriptan (MAXALT) 10 MG tablet, Take 10 mg by mouth as needed for migraine. May repeat in 2 hours if needed, Disp: , Rfl:    rizatriptan (MAXALT) 10 MG tablet, TAKE 1 TABLET (10 MG TOTAL) BY MOUTH 3 (THREE) TIMES DAILY AS NEEDED FOR MIGRAINE., Disp: 6 tablet, Rfl: 6   SYMBICORT 160-4.5 MCG/ACT inhaler, INHALE 2 PUFFS INTO THE LUNGS DAILY., Disp: 10.2 Inhaler, Rfl: 0   Julian Hy, DO [facility] 06/19/2019 5:29 PM

## 2019-06-19 NOTE — Patient Instructions (Addendum)
Thank you for visiting Dr. Carlis Abbott at [facility]. We recommend the following: Orders Placed This Encounter  Procedures  . C-reactive protein  . Sed Rate (ESR)  . IgE  . CBC w/Diff  . EKG 12-Lead  . Pulmonary function test   Orders Placed This Encounter  Procedures  . C-reactive protein    Standing Status:   Future    Standing Expiration Date:   06/18/2020  . Sed Rate (ESR)    Standing Status:   Future    Standing Expiration Date:   06/18/2020  . IgE    Standing Status:   Future    Standing Expiration Date:   06/18/2020  . CBC w/Diff    Standing Status:   Future    Standing Expiration Date:   06/18/2020  . EKG 12-Lead  . Pulmonary function test    Standing Status:   Future    Standing Expiration Date:   06/18/2020    Order Specific Question:   Where should this test be performed?    Answer:    Pulmonary    Order Specific Question:   Full PFT: includes the following: basic spirometry, spirometry pre & post bronchodilator, diffusion capacity (DLCO), lung volumes    Answer:   Full PFT    No orders of the defined types were placed in this encounter.   Return in about 4 weeks (around 07/17/2019).    Please do your part to reduce the spread of COVID-19.

## 2019-06-20 LAB — CBC WITH DIFFERENTIAL/PLATELET
Basophils Absolute: 0.1 10*3/uL (ref 0.0–0.1)
Basophils Relative: 0.8 % (ref 0.0–3.0)
Eosinophils Absolute: 0.1 10*3/uL (ref 0.0–0.7)
Eosinophils Relative: 0.8 % (ref 0.0–5.0)
HCT: 39.5 % (ref 36.0–46.0)
Hemoglobin: 13.1 g/dL (ref 12.0–15.0)
Lymphocytes Relative: 31.7 % (ref 12.0–46.0)
Lymphs Abs: 2.4 10*3/uL (ref 0.7–4.0)
MCHC: 33.3 g/dL (ref 30.0–36.0)
MCV: 94.8 fl (ref 78.0–100.0)
Monocytes Absolute: 0.6 10*3/uL (ref 0.1–1.0)
Monocytes Relative: 7.4 % (ref 3.0–12.0)
Neutro Abs: 4.5 10*3/uL (ref 1.4–7.7)
Neutrophils Relative %: 59.3 % (ref 43.0–77.0)
Platelets: 352 10*3/uL (ref 150.0–400.0)
RBC: 4.17 Mil/uL (ref 3.87–5.11)
RDW: 13.8 % (ref 11.5–15.5)
WBC: 7.7 10*3/uL (ref 4.0–10.5)

## 2019-06-20 LAB — C-REACTIVE PROTEIN: CRP: <1 mg/dL (ref 0.5–20.0)

## 2019-06-20 LAB — SEDIMENTATION RATE: Sed Rate: 18 mm/hr (ref 0–20)

## 2019-06-23 LAB — IGE: IgE (Immunoglobulin E), Serum: 117 kU/L — ABNORMAL HIGH (ref <=114)

## 2019-06-23 NOTE — Progress Notes (Signed)
Please let Ms. Aylesworth know that her inflammatory markers were not elevated.  We do not need to do any imaging at this time.  Thanks!  LPC

## 2019-06-24 NOTE — Progress Notes (Signed)
LMTCB

## 2019-06-27 IMAGING — NM NM HEPATO W/GB/PHARM/[PERSON_NAME]
2 series · 12 of 12 positions shown · non-contrast
Comparison: Ultrasound 02/27/2017

CLINICAL DATA: Periumbilical pain.  Nausea and vomiting.

EXAM:
NUCLEAR MEDICINE HEPATOBILIARY IMAGING WITH GALLBLADDER EF
TECHNIQUE: Sequential images of the abdomen were obtained [DATE] minutes
following intravenous administration of radiopharmaceutical. After
oral ingestion of Ensure, gallbladder ejection fraction was
determined. At 60 min, normal ejection fraction is greater than 33%.
RADIOPHARMACEUTICALS:  5.15 MCi Wc-CCm  Choletec IV

[he hepatobiliary · 3.43mm/px · 6 of 60 frames shown (1 of 2)]
[frame 6/60]
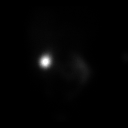
[frame 16/60]
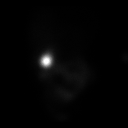
[frame 26/60]
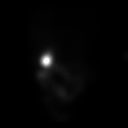
[frame 36/60]
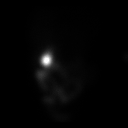
[frame 46/60]
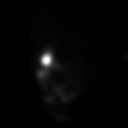
[frame 56/60]
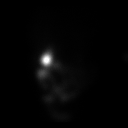

[he hepatobiliary · 3.43mm/px · 6 of 60 frames shown (2 of 2)]
[frame 6/60]
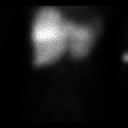
[frame 16/60]
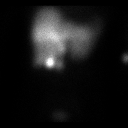
[frame 26/60]
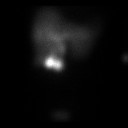
[frame 36/60]
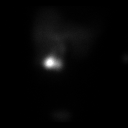
[frame 46/60]
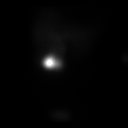
[frame 56/60]
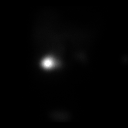

[12 of 12 positions shown; findings below may reference images not displayed]

FINDINGS: Prompt uptake and biliary excretion of activity by the liver is
seen. Gallbladder activity is visualized, consistent with patency of
cystic duct. Biliary activity passes into small bowel, consistent
with patent common bile duct.

Calculated gallbladder ejection fraction is 15%. (Normal gallbladder
ejection fraction with Ensure is greater than 33%.)
IMPRESSION: 1. Patent cystic duct without evidence for acute cholecystitis.
2. Unusually low gallbladder ejection fraction at 15%.

## 2019-06-28 ENCOUNTER — Encounter: Payer: Self-pay | Admitting: Emergency Medicine

## 2019-06-28 ENCOUNTER — Other Ambulatory Visit: Payer: Self-pay

## 2019-06-28 ENCOUNTER — Ambulatory Visit (HOSPITAL_COMMUNITY)
Admission: EM | Admit: 2019-06-28 | Discharge: 2019-06-28 | Disposition: A | Discharge status: Other Acute Inpatient Hospital | Payer: Managed Care, Other (non HMO)

## 2019-06-28 ENCOUNTER — Ambulatory Visit
Admission: EM | Admit: 2019-06-28 | Discharge: 2019-06-28 | Disposition: A | Discharge status: Home / Self Care | Payer: Managed Care, Other (non HMO) | Attending: Physician Assistant | Admitting: Physician Assistant

## 2019-06-28 DIAGNOSIS — Z20828 Contact with and (suspected) exposure to other viral communicable diseases: Secondary | ICD-10-CM | POA: Diagnosis not present

## 2019-06-28 DIAGNOSIS — J029 Acute pharyngitis, unspecified: Secondary | ICD-10-CM

## 2019-06-28 MED ORDER — LIDOCAINE VISCOUS HCL 2 % MT SOLN: OROMUCOSAL | 0 refills | Status: DC

## 2019-06-28 NOTE — ED Provider Notes (Signed)
[facility]    CSN: [id number] Arrival date & time: 06/28/19  1525      History   Chief Complaint Chief Complaint  Patient presents with  . Sore Throat    HPI Sandra Moody is a 48 y.o. female.   48 year old female comes in for 1 day history of sore throat.  Denies rhinorrhea, nasal congestion, cough.  Has felt some soreness to the right side of the neck. Denies fever, chills, body aches. Denies abdominal pain, nausea, vomiting, diarrhea. Denies shortness of breath, loss of taste/smell. No obvious sick/COVID noted.       Past Medical History:  Diagnosis Date  . Acute renal disease   . Asthma   . Bilateral carpal tunnel syndrome 03/11/2019  . Common migraine with intractable migraine 08/29/2018  . Daily headache    (03/08/2017)  . Essential hypertension 02/20/2019  . GERD (gastroesophageal reflux disease)   . H pylori ulcer ~ 08/2016  . History of hiatal hernia   . Leaky heart valve ~ 09/2016  . Migraine    "maybe weekly" (03/08/2017)  . PONV (postoperative nausea and vomiting)   . Type 2 diabetes mellitus with hyperglycemia, without long-term current use of insulin ([place]) 02/20/2019    Patient Active Problem List   Diagnosis Date Noted  . Trigger finger of left thumb 04/30/2019  . Infection of flexor tendon sheath 04/15/2019  . Carpal tunnel syndrome, bilateral 03/11/2019  . Essential hypertension 02/20/2019  . Type 2 diabetes mellitus with hyperglycemia, without long-term current use of insulin ([place]) 02/20/2019  . Other acute sinusitis 12/25/2018  . Irritable bowel syndrome 12/04/2018  . Migraine with aura 12/04/2018  . Common migraine with intractable migraine 08/29/2018  . Moderate persistent asthma without complication 12/45/8099  . Seasonal and perennial allergic rhinitis 06/28/2017  . Mild persistent asthma with acute exacerbation 06/28/2017  . Gastroesophageal reflux disease 06/28/2017  . Biliary dyskinesia 03/08/2017  . Asthma 03/02/2016  .  Asthma with acute exacerbation 10/04/2015  . Other and unspecified ovarian cyst 06/17/2012    Past Surgical History:  Procedure Laterality Date  . CHOLECYSTECTOMY N/A 03/08/2017   Procedure: LAPAROSCOPIC CHOLECYSTECTOMY;  Surgeon: Coralie Keens, MD;  Location: [facility];  Service: General;  Laterality: N/A;  . DILATION AND CURETTAGE OF UTERUS    . LAPAROSCOPIC CHOLECYSTECTOMY  03/08/2017  . TUBAL LIGATION      OB History    Gravida  5   Para  3   Term  3   Preterm  0   AB  2   Living        SAB  1   TAB  1   Ectopic  0   Multiple      Live Births               Home Medications    Prior to Admission medications   Medication Sig Start Date End Date Taking? Authorizing Provider  chlorhexidine (PERIDEX) 0.12 % solution Use as directed 15 mLs in the mouth or throat 2 (two) times daily. 05/30/19  Yes Raylene Everts, MD  fluticasone [facility]) 50 MCG/ACT nasal spray USE 1 2 SPRAYS IN EACH NOSTRIL ONCE A DAY 10/25/18  Yes [provider]  gabapentin (NEURONTIN) 100 MG capsule TAKE 2 CAPSULES (200 MG TOTAL) BY MOUTH 3 (THREE) TIMES DAILY. 06/04/19 07/04/19 Yes Rutherford Guys, MD  imipramine (TOFRANIL-PM) 100 MG capsule Take 1 capsule (100 mg total) by mouth at bedtime. 04/30/19  Yes Kathrynn Ducking,  MD  levocetirizine (XYZAL) 5 MG tablet 1 2 TABLET IN THE EVENING ONCE A DAY ORALLY 15 11/05/18  Yes [provider]  montelukast (SINGULAIR) 10 MG tablet Take 10 mg by mouth at bedtime.   Yes [provider]  pioglitazone (ACTOS) 30 MG tablet Take 1 tablet (30 mg total) by mouth daily. 02/20/19  Yes Rutherford Guys, MD  SYMBICORT 160-4.5 MCG/ACT inhaler INHALE 2 PUFFS INTO THE LUNGS DAILY. 05/09/18  Yes Valentina Shaggy, MD  ACCU-CHEK GUIDE test strip USE TO CHECK GLUCOSE TWICE DAILY,FASTING AND 2 HOURS AFTER DINNER 10/30/18   [provider]  ALBUTEROL SULFATE PO Take by mouth.    [provider]  amLODipine (NORVASC) 2.5 MG  tablet TAKE 1 TABLET BY MOUTH EVERY DAY 04/19/19   Rutherford Guys, MD  blood glucose meter kit and supplies Per insurance preference. Use to check glucose twice a day (fasting and 2 hours after dinner)  Dx E11.65 10/30/18   Rutherford Guys, MD  EPIPEN 2-PAK 0.3 MG/0.3ML SOAJ injection Inject 0.3 mg into the muscle as needed (for allergic reaction).  05/18/16   [provider]  ibuprofen (ADVIL) 800 MG tablet Take 800 mg by mouth 4 (four) times daily as needed. 05/16/19   [provider]  ketoconazole (NIZORAL) 2 % shampoo Apply 1 application topically 2 (two) times a week. Apply to affected area lather, leave in place for 5 mins and then rinse off with water 05/05/19   Rutherford Guys, MD  lidocaine (XYLOCAINE) 2 % solution 5-15 mL gurgle as needed 06/28/19   Tasia Catchings, Lamiya Naas V, PA-C  medroxyPROGESTERone (DEPO-PROVERA) 150 MG/ML injection  08/15/18   [provider]  olopatadine (PATANOL) 0.1 % ophthalmic solution Place 1 drop into both eyes 2 (two) times daily. 12/25/18   Corum, Rex Kras, MD  rizatriptan (MAXALT) 10 MG tablet Take 10 mg by mouth as needed for migraine. May repeat in 2 hours if needed    [provider]  rizatriptan (MAXALT) 10 MG tablet TAKE 1 TABLET (10 MG TOTAL) BY MOUTH 3 (THREE) TIMES DAILY AS NEEDED FOR MIGRAINE. 06/02/19   Kathrynn Ducking, MD    Family History Family History  Problem Relation Age of Onset  . Thyroid disease Mother   . Diabetes Maternal Grandmother   . Heart disease Maternal Grandfather     Social History Social History   Tobacco Use  . Smoking status: Never Smoker  . Smokeless tobacco: Never Used  Substance Use Topics  . Alcohol use: Yes    Comment: 03/08/2017 "might have a drink on a holiday"  . Drug use: No     Allergies   Ginger, Passion fruit flavor [flavoring agent], Peanuts [peanut oil], Shellfish allergy, and Tamiflu [oseltamivir phosphate]   Review of Systems Review of Systems  Reason unable to perform ROS:  See HPI as above.     Physical Exam Triage Vital Signs ED Triage Vitals  Enc Vitals Group     BP 06/28/19 1555 139/86     Pulse Rate 06/28/19 1555 93     Resp 06/28/19 1555 16     Temp 06/28/19 1555 98.3 F (36.8 C)     Temp Source 06/28/19 1555 Oral     SpO2 06/28/19 1555 98 %     Weight --      Height --      Head Circumference --      Peak Flow --      Pain Score  06/28/19 1549 3     Pain Loc --      Pain Edu? --      Excl. in [place]? --    No data found.  Updated Vital Signs BP 139/86 (BP Location: Left Arm)   Pulse 93   Temp 98.3 F (36.8 C) (Oral)   Resp 16   SpO2 98%   Physical Exam Constitutional:      General: She is not in acute distress.    Appearance: Normal appearance. She is not ill-appearing, toxic-appearing or diaphoretic.  HENT:     Head: Normocephalic and atraumatic.     Mouth/Throat:     Mouth: Mucous membranes are moist.     Pharynx: Oropharynx is clear. Uvula midline. No posterior oropharyngeal erythema.     Tonsils: No tonsillar exudate. 1+ on the right. 1+ on the left.  Neck:     Musculoskeletal: Normal range of motion and neck supple.  Cardiovascular:     Rate and Rhythm: Normal rate and regular rhythm.     Heart sounds: Normal heart sounds. No murmur. No friction rub. No gallop.   Pulmonary:     Effort: Pulmonary effort is normal. No accessory muscle usage, prolonged expiration, respiratory distress or retractions.     Comments: Lungs clear to auscultation without adventitious lung sounds. Neurological:     General: No focal deficit present.     Mental Status: She is alert and oriented to person, place, and time.      UC Treatments / Results  Labs (all labs ordered are listed, but only abnormal results are displayed) Labs Reviewed  NOVEL CORONAVIRUS, NAA    EKG   Radiology No results found.  Procedures Procedures (including critical care time)  Medications Ordered in UC Medications - No data to display  Initial  Impression / Assessment and Plan / UC Course  I have reviewed the triage vital signs and the nursing notes.  Pertinent labs & imaging results that were available during my care of the patient were reviewed by me and considered in my medical decision making (see chart for details).    Given 1 day history of mild sore throat, ?accuracy with rapid COVID.  Discussed rapid testing versus PCR testing.  Patient okay to defer rapid testing at this time.  PCR Covid testing sent.  Patient to quarantine until testing results return.  Symptomatic treatment discussed.  Push fluids.  Return if symptoms given.  Patient expresses understanding and agrees to plan.  Final Clinical Impressions(s) / UC Diagnoses   Final diagnoses:  Sore throat    ED Prescriptions    Medication Sig Dispense Auth. Provider   lidocaine (XYLOCAINE) 2 % solution 5-15 mL gurgle as needed 150 mL Ok Edwards, PA-C     PDMP not reviewed this encounter.   Ok Edwards, PA-C 06/28/19 1927

## 2019-06-28 NOTE — ED Triage Notes (Signed)
Sore throat started this morning.  Throat is scratchy, patient has more soreness on side of throat/neck.

## 2019-06-28 NOTE — Discharge Instructions (Signed)
As discussed, cannot rule out COVID. Testing ordered. I would like you to quarantine until testing results. Start lidocaine for sore throat, do not eat or drink for the next 40 mins after use as it can stunt your gag reflex. You can take over the counter flonase/nasacort to help with nasal congestion/drainage. If experiencing shortness of breath, trouble breathing, go to the emergency department for further evaluation needed. Monitor for any worsening of symptoms, swelling of the throat, trouble breathing, trouble swallowing, leaning forward to breath, drooling, go to the emergency department for further evaluation needed.

## 2019-06-29 LAB — NOVEL CORONAVIRUS, NAA: SARS-CoV-2, NAA: NOT DETECTED

## 2019-07-04 ENCOUNTER — Telehealth: Payer: Self-pay | Admitting: Critical Care Medicine

## 2019-07-04 NOTE — Telephone Encounter (Signed)
Advised pt of results. Pt understood and nothing further is needed.    Notes recorded by Julian Hy, DO on 06/23/2019 at 5:41 PM EST  Please let Ms. Turnipseed know that her inflammatory markers were not elevated. We do not need to do any imaging at this time. Thanks!

## 2019-07-15 ENCOUNTER — Ambulatory Visit: Payer: Managed Care, Other (non HMO) | Admitting: Family Medicine

## 2019-07-15 ENCOUNTER — Other Ambulatory Visit: Payer: Self-pay | Admitting: Family Medicine

## 2019-07-31 ENCOUNTER — Emergency Department (HOSPITAL_COMMUNITY)
Admission: EM | Admit: 2019-07-31 | Discharge: 2019-07-31 | Disposition: A | Discharge status: Home / Self Care | Payer: Managed Care, Other (non HMO) | Attending: Emergency Medicine | Admitting: Emergency Medicine

## 2019-07-31 ENCOUNTER — Encounter (HOSPITAL_COMMUNITY): Payer: Self-pay | Admitting: *Deleted

## 2019-07-31 ENCOUNTER — Other Ambulatory Visit: Payer: Self-pay

## 2019-07-31 DIAGNOSIS — E162 Hypoglycemia, unspecified: Secondary | ICD-10-CM

## 2019-07-31 DIAGNOSIS — Z91010 Allergy to peanuts: Secondary | ICD-10-CM | POA: Diagnosis not present

## 2019-07-31 DIAGNOSIS — J45909 Unspecified asthma, uncomplicated: Secondary | ICD-10-CM | POA: Insufficient documentation

## 2019-07-31 DIAGNOSIS — E11649 Type 2 diabetes mellitus with hypoglycemia without coma: Secondary | ICD-10-CM | POA: Insufficient documentation

## 2019-07-31 DIAGNOSIS — I10 Essential (primary) hypertension: Secondary | ICD-10-CM | POA: Diagnosis not present

## 2019-07-31 DIAGNOSIS — Z79899 Other long term (current) drug therapy: Secondary | ICD-10-CM | POA: Diagnosis not present

## 2019-07-31 DIAGNOSIS — R450 Nervousness: Secondary | ICD-10-CM | POA: Diagnosis not present

## 2019-07-31 LAB — COMPREHENSIVE METABOLIC PANEL
ALT: 18 U/L (ref 0–44)
AST: 20 U/L (ref 15–41)
Albumin: 3.7 g/dL (ref 3.5–5.0)
Alkaline Phosphatase: 62 U/L (ref 38–126)
Anion gap: 9 (ref 5–15)
BUN: 6 mg/dL (ref 6–20)
CO2: 26 mmol/L (ref 22–32)
Calcium: 8.9 mg/dL (ref 8.9–10.3)
Chloride: 105 mmol/L (ref 98–111)
Creatinine, Ser: 1.08 mg/dL — ABNORMAL HIGH (ref 0.44–1.00)
GFR calc Af Amer: >60 mL/min (ref >60)
GFR calc non Af Amer: >60 mL/min (ref >60)
Glucose, Bld: 121 mg/dL — ABNORMAL HIGH (ref 70–99)
Potassium: 3.3 mmol/L — ABNORMAL LOW (ref 3.5–5.1)
Sodium: 140 mmol/L (ref 135–145)
Total Bilirubin: 0.6 mg/dL (ref 0.3–1.2)
Total Protein: 6.9 g/dL (ref 6.5–8.1)

## 2019-07-31 LAB — CBC
HCT: 40.5 % (ref 36.0–46.0)
Hemoglobin: 13 g/dL (ref 12.0–15.0)
MCH: 31.1 pg (ref 26.0–34.0)
MCHC: 32.1 g/dL (ref 30.0–36.0)
MCV: 96.9 fL (ref 80.0–100.0)
Platelets: 357 10*3/uL (ref 150–400)
RBC: 4.18 MIL/uL (ref 3.87–5.11)
RDW: 13.4 % (ref 11.5–15.5)
WBC: 6.7 10*3/uL (ref 4.0–10.5)
nRBC: 0 % (ref 0.0–0.2)

## 2019-07-31 LAB — I-STAT BETA HCG BLOOD, ED (MC, WL, AP ONLY): I-stat hCG, quantitative: <5 m[IU]/mL (ref <5)

## 2019-07-31 LAB — CBG MONITORING, ED
Glucose-Capillary: 113 mg/dL — ABNORMAL HIGH (ref 70–99)
Glucose-Capillary: 89 mg/dL (ref 70–99)

## 2019-07-31 LAB — LIPASE, BLOOD: Lipase: 21 U/L (ref 11–51)

## 2019-07-31 MED ORDER — SODIUM CHLORIDE 0.9% FLUSH: 3.0000 mL | Freq: Once | INTRAVENOUS | Status: DC

## 2019-07-31 MED ORDER — POTASSIUM CHLORIDE CRYS ER 20 MEQ PO TBCR
40.0000 meq | EXTENDED_RELEASE_TABLET | Freq: Once | ORAL | Status: AC
  Administered 2019-07-31: 40 meq via ORAL
  Filled 2019-07-31: qty 2

## 2019-07-31 NOTE — Discharge Instructions (Addendum)
You were seen today for concerns for hypoglycemia.  Your blood sugar has stayed within a normal range; however, you seem to be very sensitive to blood sugars less than 125.  Keep a log of your blood sugars at least 3 times per day.  Make sure that you are eating frequent small meals.  Follow-up with your primary physician for adjustments in your medications.  Make sure that you are staying hydrated.  Of note, your potassium was slightly low.  Increase potassium intake in your diet.

## 2019-07-31 NOTE — ED Notes (Signed)
States she is having problems with her blood sugar staying up, states when she went to bed last pm her blood sugar was 90 states her vision was a little blurry .

## 2019-07-31 NOTE — ED Notes (Signed)
Pt attempted to give urine specimen. States "it's like I have to go I have the urge but it wont come out."

## 2019-07-31 NOTE — ED Provider Notes (Signed)
[facility] EMERGENCY DEPARTMENT Provider Note   CSN: [id number] Arrival date & time: 07/31/19  0309     History Chief Complaint  Patient presents with  . Hypoglycemia    Sandra Moody is a 48 y.o. female.  HPI     This is a 48 year old female with a history of diabetes, asthma who presents with episodes of hypoglycemia.  Patient reports all day yesterday she felt like her blood sugar was low.  She states that when her blood sugar drops close to 100, she gets shaky and gets blurry vision.  Yesterday while at work, she did not take her blood sugar but noted multiple times throughout the day feeling the symptoms.  Symptoms improved after she ate candy or drink some fluids.  She currently takes Actos for her diabetes.  She denies any recent changes in dosage of this medication.  She does report that she had previously been on 2 other diabetes medications that did not work very well.  She does not take any insulin.  She states that last night she went to bed after eating and her blood sugar was in the 180s.  She woke up this morning feeling jittery and blood sugar had dropped to 94.  This concerned her.  Currently, she states she feels okay.  She denies any fevers, cough, chest pain, shortness of breath, abdominal pain, nausea, vomiting.  No recent known Covid exposures.  Past Medical History:  Diagnosis Date  . Acute renal disease   . Asthma   . Bilateral carpal tunnel syndrome 03/11/2019  . Common migraine with intractable migraine 08/29/2018  . Daily headache    (03/08/2017)  . Essential hypertension 02/20/2019  . GERD (gastroesophageal reflux disease)   . H pylori ulcer ~ 08/2016  . History of hiatal hernia   . Leaky heart valve ~ 09/2016  . Migraine    "maybe weekly" (03/08/2017)  . PONV (postoperative nausea and vomiting)   . Type 2 diabetes mellitus with hyperglycemia, without long-term current use of insulin ([place]) 02/20/2019    Patient Active Problem List   Diagnosis Date Noted  . Trigger finger of left thumb 04/30/2019  . Infection of flexor tendon sheath 04/15/2019  . Carpal tunnel syndrome, bilateral 03/11/2019  . Essential hypertension 02/20/2019  . Type 2 diabetes mellitus with hyperglycemia, without long-term current use of insulin ([place]) 02/20/2019  . Other acute sinusitis 12/25/2018  . Irritable bowel syndrome 12/04/2018  . Migraine with aura 12/04/2018  . Common migraine with intractable migraine 08/29/2018  . Moderate persistent asthma without complication 52/84/1324  . Seasonal and perennial allergic rhinitis 06/28/2017  . Mild persistent asthma with acute exacerbation 06/28/2017  . Gastroesophageal reflux disease 06/28/2017  . Biliary dyskinesia 03/08/2017  . Asthma 03/02/2016  . Asthma with acute exacerbation 10/04/2015  . Other and unspecified ovarian cyst 06/17/2012    Past Surgical History:  Procedure Laterality Date  . CHOLECYSTECTOMY N/A 03/08/2017   Procedure: LAPAROSCOPIC CHOLECYSTECTOMY;  Surgeon: Coralie Keens, MD;  Location: [facility];  Service: General;  Laterality: N/A;  . DILATION AND CURETTAGE OF UTERUS    . LAPAROSCOPIC CHOLECYSTECTOMY  03/08/2017  . TUBAL LIGATION       OB History    Gravida  5   Para  3   Term  3   Preterm  0   AB  2   Living        SAB  1   TAB  1   Ectopic  0  Multiple      Live Births              Family History  Problem Relation Age of Onset  . Thyroid disease Mother   . Diabetes Maternal Grandmother   . Heart disease Maternal Grandfather     Social History   Tobacco Use  . Smoking status: Never Smoker  . Smokeless tobacco: Never Used  Substance Use Topics  . Alcohol use: Yes    Comment: 03/08/2017 "might have a drink on a holiday"  . Drug use: No    Home Medications Prior to Admission medications   Medication Sig Start Date End Date Taking? Authorizing Provider  ACCU-CHEK GUIDE test strip USE TO CHECK GLUCOSE TWICE DAILY,FASTING AND 2 HOURS  AFTER DINNER 07/15/19   Rutherford Guys, MD  ALBUTEROL SULFATE PO Take by mouth.    [provider]  amLODipine (NORVASC) 2.5 MG tablet TAKE 1 TABLET BY MOUTH EVERY DAY 07/15/19   Rutherford Guys, MD  blood glucose meter kit and supplies Per insurance preference. Use to check glucose twice a day (fasting and 2 hours after dinner)  Dx E11.65 10/30/18   Rutherford Guys, MD  chlorhexidine (PERIDEX) 0.12 % solution Use as directed 15 mLs in the mouth or throat 2 (two) times daily. 05/30/19   Raylene Everts, MD  EPIPEN 2-PAK 0.3 MG/0.3ML SOAJ injection Inject 0.3 mg into the muscle as needed (for allergic reaction).  05/18/16   [provider]  fluticasone (FLONASE) 50 MCG/ACT nasal spray USE 1 2 SPRAYS IN EACH NOSTRIL ONCE A DAY 10/25/18   [provider]  gabapentin (NEURONTIN) 100 MG capsule TAKE 2 CAPSULES (200 MG TOTAL) BY MOUTH 3 (THREE) TIMES DAILY. 06/04/19 07/04/19  Rutherford Guys, MD  ibuprofen (ADVIL) 800 MG tablet Take 800 mg by mouth 4 (four) times daily as needed. 05/16/19   [provider]  imipramine (TOFRANIL-PM) 100 MG capsule Take 1 capsule (100 mg total) by mouth at bedtime. 04/30/19   Kathrynn Ducking, MD  ketoconazole (NIZORAL) 2 % shampoo Apply 1 application topically 2 (two) times a week. Apply to affected area lather, leave in place for 5 mins and then rinse off with water 05/05/19   Rutherford Guys, MD  levocetirizine (XYZAL) 5 MG tablet 1 2 TABLET IN THE EVENING ONCE A DAY ORALLY 15 11/05/18   [provider]  lidocaine (XYLOCAINE) 2 % solution 5-15 mL gurgle as needed 06/28/19   Tasia Catchings, Amy V, PA-C  medroxyPROGESTERone (DEPO-PROVERA) 150 MG/ML injection  08/15/18   [provider]  montelukast (SINGULAIR) 10 MG tablet Take 10 mg by mouth at bedtime.    [provider]  olopatadine (PATANOL) 0.1 % ophthalmic solution Place 1 drop into both eyes 2 (two) times daily. 12/25/18   Corum, Rex Kras, MD  pioglitazone (ACTOS) 30  MG tablet Take 1 tablet (30 mg total) by mouth daily. 02/20/19   Rutherford Guys, MD  rizatriptan (MAXALT) 10 MG tablet Take 10 mg by mouth as needed for migraine. May repeat in 2 hours if needed    [provider]  rizatriptan (MAXALT) 10 MG tablet TAKE 1 TABLET (10 MG TOTAL) BY MOUTH 3 (THREE) TIMES DAILY AS NEEDED FOR MIGRAINE. 06/02/19   Kathrynn Ducking, MD  SYMBICORT 160-4.5 MCG/ACT inhaler INHALE 2 PUFFS INTO THE LUNGS DAILY. 05/09/18   Valentina Shaggy, MD    Allergies    Ginger, Passion fruit flavor [flavoring agent], Peanuts [  peanut oil], Shellfish allergy, and Tamiflu [oseltamivir phosphate]  Review of Systems   Review of Systems  Constitutional: Negative for fever.  Eyes: Positive for visual disturbance.  Respiratory: Negative for shortness of breath.   Cardiovascular: Negative for chest pain.  Gastrointestinal: Negative for abdominal pain, nausea and vomiting.  Genitourinary: Negative for dysuria.  Neurological: Positive for light-headedness.  All other systems reviewed and are negative.   Physical Exam Updated Vital Signs BP 138/90 (BP Location: Left Arm)   Pulse (!) 105   Temp 99.1 F (37.3 C) (Oral)   Resp 15   Ht 1.626 m (5' 4" )   Wt 73.5 kg   SpO2 100%   BMI 27.81 kg/m   Physical Exam Vitals and nursing note reviewed.  Constitutional:      Appearance: She is well-developed.     Comments: Overweight, no acute distress  HENT:     Head: Normocephalic and atraumatic.     Nose: Nose normal.     Mouth/Throat:     Mouth: Mucous membranes are moist.  Eyes:     Extraocular Movements: Extraocular movements intact.     Pupils: Pupils are equal, round, and reactive to light.  Cardiovascular:     Rate and Rhythm: Normal rate and regular rhythm.     Heart sounds: Normal heart sounds.  Pulmonary:     Effort: Pulmonary effort is normal. No respiratory distress.     Breath sounds: No wheezing.  Abdominal:     Palpations: Abdomen is soft.      Tenderness: There is no abdominal tenderness.  Musculoskeletal:     Cervical back: Neck supple.     Right lower leg: No edema.     Left lower leg: No edema.  Skin:    General: Skin is warm and dry.  Neurological:     Mental Status: She is alert and oriented to person, place, and time.  Psychiatric:        Mood and Affect: Mood normal.     ED Results / Procedures / Treatments   Labs (all labs ordered are listed, but only abnormal results are displayed) Labs Reviewed  COMPREHENSIVE METABOLIC PANEL - Abnormal; Notable for the following components:      Result Value   Potassium 3.3 (*)    Glucose, Bld 121 (*)    Creatinine, Ser 1.08 (*)    All other components within normal limits  CBG MONITORING, ED - Abnormal; Notable for the following components:   Glucose-Capillary 113 (*)    All other components within normal limits  LIPASE, BLOOD  CBC  URINALYSIS, ROUTINE W REFLEX MICROSCOPIC  I-STAT BETA HCG BLOOD, ED ([facility], [facility], [facility] ONLY)  CBG MONITORING, ED    EKG EKG Interpretation  Date/Time:  Thursday July 31 2019 05:42:19 EST Ventricular Rate:  96 PR Interval:    QRS Duration: 79 QT Interval:  363 QTC Calculation: 459 R Axis:   59 Text Interpretation: Sinus rhythm Abnormal R-wave progression, early transition Confirmed by Thayer Jew [id number]) on 07/31/2019 5:46:45 AM   Radiology No results found.  Procedures Procedures (including critical care time)  Medications Ordered in ED Medications  sodium chloride flush (NS) 0.9 % injection 3 mL (has no administration in time range)  potassium chloride SA (KLOR-CON) CR tablet 40 mEq (has no administration in time range)    ED Course  I have reviewed the triage vital signs and the nursing notes.  Pertinent labs & imaging results that were available during my care of  the patient were reviewed by me and considered in my medical decision making (see chart for details).    MDM Rules/Calculators/A&P                       Patient presents with jittery feeling and vision changes.  She relates this to low blood sugars.  She is not technically hypoglycemic but reports that she is sensitive when her blood sugars are less than 125-150.  No recent changes in meds.  Actos does not typically cause hypoglycemia.  Blood sugars in the ER 89-113.  She is overall nontoxic and vital signs largely reassuring.  She is mildly tachycardic at 105.  No evidence of ischemia or arrhythmia on her EKG.  She has no other complaints and feels well at this time.  Her lab work is reviewed.  Potassium is 3.3.  She was given 1 dose of oral potassium and encouraged to increase potassium intake in her diet.  She has no exam findings or abnormalities include neurologic exam.  I discussed with the patient that she needs to keep a close log of her blood sugars and symptoms.  Follow-up with primary physician.  In the meantime, increase potassium in diet and make sure that she is staying hydrated.  After history, exam, and medical workup I feel the patient has been appropriately medically screened and is safe for discharge home. Pertinent diagnoses were discussed with the patient. Patient was given return precautions.    Final Clinical Impression(s) / ED Diagnoses Final diagnoses:  Jittery feeling  Hypoglycemia    Rx / DC Orders ED Discharge Orders    None       Onica Davidovich, Barbette [facility], MD 07/31/19 [phone]

## 2019-07-31 NOTE — ED Triage Notes (Signed)
The pt is a diabetic on actos  Since yesterday her blood sugar has been dropping regardless of how much she eats especially sweets.  Headache and nausea associated with  The blood sugars dropping  lmp on depo

## 2019-08-07 ENCOUNTER — Ambulatory Visit (INDEPENDENT_AMBULATORY_CARE_PROVIDER_SITE_OTHER): Payer: Managed Care, Other (non HMO) | Admitting: Family Medicine

## 2019-08-07 ENCOUNTER — Encounter: Payer: Self-pay | Admitting: Family Medicine

## 2019-08-07 ENCOUNTER — Other Ambulatory Visit: Payer: Self-pay

## 2019-08-07 VITALS — BP 126/87 | HR 103 | Temp 98.7°F | Ht 64.0 in | Wt 159.0 lb

## 2019-08-07 DIAGNOSIS — E11649 Type 2 diabetes mellitus with hypoglycemia without coma: Secondary | ICD-10-CM

## 2019-08-07 DIAGNOSIS — I10 Essential (primary) hypertension: Secondary | ICD-10-CM | POA: Diagnosis not present

## 2019-08-07 DIAGNOSIS — E876 Hypokalemia: Secondary | ICD-10-CM

## 2019-08-07 LAB — GLUCOSE, POCT (MANUAL RESULT ENTRY): POC Glucose: 93 mg/dl (ref 70–99)

## 2019-08-07 LAB — POCT GLYCOSYLATED HEMOGLOBIN (HGB A1C): Hemoglobin A1C: 6.1 % — AB (ref 4.0–5.6)

## 2019-08-07 NOTE — Patient Instructions (Signed)
° ° ° °  If you have lab work done today you will be contacted with your lab results within the next 2 weeks.  If you have not heard from us then please contact us. The fastest way to get your results is to register for My Chart. ° ° °IF you received an x-ray today, you will receive an invoice from [facility]. Please contact [facility] at [phone] with questions or concerns regarding your invoice.  ° °IF you received labwork today, you will receive an invoice from [facility]. Please contact [facility] at [phone] with questions or concerns regarding your invoice.  ° °Our billing staff will not be able to assist you with questions regarding bills from these companies. ° °You will be contacted with the lab results as soon as they are available. The fastest way to get your results is to activate your My Chart account. Instructions are located on the last page of this paperwork. If you have not heard from us regarding the results in 2 weeks, please contact this office. °  ° ° ° °

## 2019-08-07 NOTE — Progress Notes (Signed)
1/7/20218:42 AM  Sandra Moody September 18, 1970, 49 y.o., female [id number]  Chief Complaint  Patient presents with  . Diabetes    blood sugar is dropping in the night. Says she wakes up feeling sick and sweaty. Numbers stay pretty low, want to discuss whether she still needs the med    HPI:   Patient is a 49 y.o. female with past medical history significant for DM2, HTN, migraines, asthma, GERD, biliary dyskinesia,who presents today for concerns of hypoglycemia  Last OV Oct 2020 for DOE, orthopnea  She was seen in ER on dec 31st for concerns of hypoglycemia, cbg range 89-113, found to have mild hypokalemia at 3.3  Saw pulm in nov 2020 for chest tightness at night and increased wheezing - normal inflammatory markers, PFTs scheduled for later this month  Having cbgs in the 60s at night She gets shaky, sweaty, nauseous Since she went to the ER it has been happening on most nights She has had some episodes also during the day while at work She drinks cranberry juice, crackers with cheese or peanut butter She reports it takes about an hour for cbg to get back to 150s She goes to bed with cbgs at least in 180s Has not stopped taking actos 38m However states that since she started it she has constant low grade nausea Also had hypoglycemia with low dose glipizide She had severe diarrhea with low dose metformin Diagnosed with diabetes in march 2020 - a1c 10.3 Has changed her diet: mostly eating baked/grilled chicken and steak and veggies Does not eat lots of bread, fruit, potatoes and pasta Reports normal BM and UOP  Lab Results  Component Value Date   HGBA1C 6.7 (A) 02/20/2019   HGBA1C 10.3 (H) 10/01/2018   Lab Results  Component Value Date   LDLCALC 84 11/18/2018   CREATININE 1.08 (H) 07/31/2019   Lab Results  Component Value Date   ALT 18 07/31/2019   AST 20 07/31/2019   ALKPHOS 62 07/31/2019   BILITOT 0.6 07/31/2019    [facility] 05/23/2019 02/20/2019  01/28/2019  Decreased Interest 0 0 0  Down, Depressed, Hopeless 0 0 0  PHQ - 2 Score 0 0 0    Fall Risk  05/23/2019 02/20/2019 01/28/2019 12/25/2018 11/18/2018  Falls in the past year? 0 0 0 0 0  Number falls in past yr: 0 0 - - 0  Injury with Fall? 0 0 - - 0  Follow up - Falls evaluation completed - - -     Allergies  Allergen Reactions  . Ginger Itching and Swelling  . Passion Fruit Flavor [Flavoring Agent] Itching and Swelling  . Peanuts [Peanut Oil] Anaphylaxis    PT ALLERGIC TO ALL TREE NUTS  . Shellfish Allergy Anaphylaxis  . Tamiflu [Oseltamivir Phosphate]     Prior to Admission medications   Medication Sig Start Date End Date Taking? Authorizing Provider  ACCU-CHEK GUIDE test strip USE TO CHECK GLUCOSE TWICE DAILY,FASTING AND 2 HOURS AFTER DINNER 07/15/19  Yes SRutherford Guys MD  ALBUTEROL SULFATE PO Take by mouth.   Yes [provider]  amLODipine (NORVASC) 2.5 MG tablet TAKE 1 TABLET BY MOUTH EVERY DAY 07/15/19  Yes SRutherford Guys MD  blood glucose meter kit and supplies Per insurance preference. Use to check glucose twice a day (fasting and 2 hours after dinner)  Dx E11.65 10/30/18  Yes SRutherford Guys MD  chlorhexidine (PERIDEX) 0.12 % solution Use as directed 15 mLs in  the mouth or throat 2 (two) times daily. 05/30/19  Yes Raylene Everts, MD  EPIPEN 2-PAK 0.3 MG/0.3ML SOAJ injection Inject 0.3 mg into the muscle as needed (for allergic reaction).  05/18/16  Yes [provider]  fluticasone (FLONASE) 50 MCG/ACT nasal spray USE 1 2 SPRAYS IN EACH NOSTRIL ONCE A DAY 10/25/18  Yes [provider]  ibuprofen (ADVIL) 800 MG tablet Take 800 mg by mouth 4 (four) times daily as needed. 05/16/19  Yes [provider]  imipramine (TOFRANIL-PM) 100 MG capsule Take 1 capsule (100 mg total) by mouth at bedtime. 04/30/19  Yes Kathrynn Ducking, MD  ketoconazole (NIZORAL) 2 % shampoo Apply 1 application topically 2 (two) times a week. Apply to  affected area lather, leave in place for 5 mins and then rinse off with water 05/05/19  Yes Rutherford Guys, MD  levocetirizine (XYZAL) 5 MG tablet 1 2 TABLET IN THE EVENING ONCE A DAY ORALLY 15 11/05/18  Yes [provider]  lidocaine (XYLOCAINE) 2 % solution 5-15 mL gurgle as needed 06/28/19  Yes Yu, Amy V, PA-C  medroxyPROGESTERone (DEPO-PROVERA) 150 MG/ML injection  08/15/18  Yes [provider]  montelukast (SINGULAIR) 10 MG tablet Take 10 mg by mouth at bedtime.   Yes [provider]  olopatadine (PATANOL) 0.1 % ophthalmic solution Place 1 drop into both eyes 2 (two) times daily. 12/25/18  Yes Corum, Rex Kras, MD  pioglitazone (ACTOS) 30 MG tablet Take 1 tablet (30 mg total) by mouth daily. 02/20/19  Yes Rutherford Guys, MD  rizatriptan (MAXALT) 10 MG tablet Take 10 mg by mouth as needed for migraine. May repeat in 2 hours if needed   Yes [provider]  rizatriptan (MAXALT) 10 MG tablet TAKE 1 TABLET (10 MG TOTAL) BY MOUTH 3 (THREE) TIMES DAILY AS NEEDED FOR MIGRAINE. 06/02/19  Yes Kathrynn Ducking, MD  SYMBICORT 160-4.5 MCG/ACT inhaler INHALE 2 PUFFS INTO THE LUNGS DAILY. 05/09/18  Yes Valentina Shaggy, MD  gabapentin (NEURONTIN) 100 MG capsule TAKE 2 CAPSULES (200 MG TOTAL) BY MOUTH 3 (THREE) TIMES DAILY. 06/04/19 07/04/19  Rutherford Guys, MD    Past Medical History:  Diagnosis Date  . Acute renal disease   . Asthma   . Bilateral carpal tunnel syndrome 03/11/2019  . Common migraine with intractable migraine 08/29/2018  . Daily headache    (03/08/2017)  . Essential hypertension 02/20/2019  . GERD (gastroesophageal reflux disease)   . H pylori ulcer ~ 08/2016  . History of hiatal hernia   . Leaky heart valve ~ 09/2016  . Migraine    "maybe weekly" (03/08/2017)  . PONV (postoperative nausea and vomiting)   . Type 2 diabetes mellitus with hyperglycemia, without long-term current use of insulin ([place]) 02/20/2019    Past Surgical History:  Procedure  Laterality Date  . CHOLECYSTECTOMY N/A 03/08/2017   Procedure: LAPAROSCOPIC CHOLECYSTECTOMY;  Surgeon: Coralie Keens, MD;  Location: [place];  Service: General;  Laterality: N/A;  . DILATION AND CURETTAGE OF UTERUS    . LAPAROSCOPIC CHOLECYSTECTOMY  03/08/2017  . TUBAL LIGATION      Social History   Tobacco Use  . Smoking status: Never Smoker  . Smokeless tobacco: Never Used  Substance Use Topics  . Alcohol use: Yes    Comment: 03/08/2017 "might have a drink on a holiday"    Family History  Problem Relation Age of Onset  . Thyroid disease Mother   . Diabetes Maternal Grandmother   .  Heart disease Maternal Grandfather     ROS Per hpi  OBJECTIVE:  Today's Vitals   08/07/19 0830  BP: 126/87  Pulse: (!) 103  Temp: 98.7 F (37.1 C)  SpO2: 96%  Weight: 159 lb (72.1 kg)  Height: 5' 4"  (1.626 m)   Body mass index is 27.29 kg/m.  Wt Readings from Last 3 Encounters:  08/07/19 159 lb (72.1 kg)  07/31/19 162 lb (73.5 kg)  06/19/19 162 lb 12.8 oz (73.8 kg)    Physical Exam Vitals and nursing note reviewed.  Constitutional:      Appearance: She is well-developed.  HENT:     Head: Normocephalic and atraumatic.     Mouth/Throat:     Pharynx: No oropharyngeal exudate.  Eyes:     General: No scleral icterus.    Conjunctiva/sclera: Conjunctivae normal.     Pupils: Pupils are equal, round, and reactive to light.  Cardiovascular:     Rate and Rhythm: Normal rate and regular rhythm.     Heart sounds: Normal heart sounds. No murmur. No friction rub. No gallop.   Pulmonary:     Effort: Pulmonary effort is normal.     Breath sounds: Normal breath sounds. No wheezing, rhonchi or rales.  Abdominal:     General: Bowel sounds are normal.     Palpations: Abdomen is soft. There is no mass.     Tenderness: There is no abdominal tenderness.  Musculoskeletal:     Cervical back: Neck supple.  Skin:    General: Skin is warm and dry.  Neurological:     Mental Status: She is alert  and oriented to person, place, and time.     Results for orders placed or performed in visit on 08/07/19 (from the past 24 hour(s))  POCT glucose (manual entry)     Status: None   Collection Time: 08/07/19  8:59 AM  Result Value Ref Range   POC Glucose 93 70 - 99 mg/dl  POCT A1C     Status: Abnormal   Collection Time: 08/07/19  9:05 AM  Result Value Ref Range   Hemoglobin A1C 6.1 (A) 4.0 - 5.6 %   HbA1c POC (<> result, manual entry)     HbA1c, POC (prediabetic range)     HbA1c, POC (controlled diabetic range)      No results found.   ASSESSMENT and PLAN  1. Type 2 diabetes mellitus with hypoglycemia without coma, without long-term current use of insulin ([place]) Patient with a1c at goal however she has been having recurring hypoglycemia since starting several oral medications. Stopping meds today. Cont LFM and cbg monitoring. Referring to endo.  - POCT A1C - POCT glucose (manual entry) - Lipid panel - Ambulatory referral to Endocrinology  2. Essential hypertension Controlled. Continue current regime.   3. Hypokalemia Labs pending. Will start supplement if needed. - Basic Metabolic Panel  Return for after endocrinology.    Rutherford Guys, MD [facility], [place] [address] Ph.  [phone] Fax [phone]

## 2019-08-08 LAB — BASIC METABOLIC PANEL
BUN/Creatinine Ratio: 9 (ref 9–23)
BUN: 10 mg/dL (ref 6–24)
CO2: 23 mmol/L (ref 20–29)
Calcium: 9.7 mg/dL (ref 8.7–10.2)
Chloride: 103 mmol/L (ref 96–106)
Creatinine, Ser: 1.06 mg/dL — ABNORMAL HIGH (ref 0.57–1.00)
GFR calc Af Amer: 73 mL/min/{1.73_m2} (ref >59)
GFR calc non Af Amer: 63 mL/min/{1.73_m2} (ref >59)
Glucose: 88 mg/dL (ref 65–99)
Potassium: 3.7 mmol/L (ref 3.5–5.2)
Sodium: 140 mmol/L (ref 134–144)

## 2019-08-08 LAB — LIPID PANEL
Chol/HDL Ratio: 3.5 ratio (ref 0.0–4.4)
Cholesterol, Total: 124 mg/dL (ref 100–199)
HDL: 35 mg/dL — ABNORMAL LOW (ref >39)
LDL Chol Calc (NIH): 72 mg/dL (ref 0–99)
Triglycerides: 86 mg/dL (ref 0–149)
VLDL Cholesterol Cal: 17 mg/dL (ref 5–40)

## 2019-08-18 ENCOUNTER — Ambulatory Visit: Payer: Managed Care, Other (non HMO) | Admitting: Family Medicine

## 2019-08-21 ENCOUNTER — Encounter: Payer: Self-pay | Admitting: Endocrinology

## 2019-08-23 ENCOUNTER — Other Ambulatory Visit (HOSPITAL_COMMUNITY)
Admission: RE | Admit: 2019-08-23 | Discharge: 2019-08-23 | Disposition: A | Discharge status: Home / Self Care | Payer: Managed Care, Other (non HMO) | Source: Ambulatory Visit | Attending: Critical Care Medicine | Admitting: Critical Care Medicine

## 2019-08-23 DIAGNOSIS — Z01812 Encounter for preprocedural laboratory examination: Secondary | ICD-10-CM | POA: Insufficient documentation

## 2019-08-23 DIAGNOSIS — Z20822 Contact with and (suspected) exposure to covid-19: Secondary | ICD-10-CM | POA: Diagnosis not present

## 2019-08-23 LAB — SARS CORONAVIRUS 2 (TAT 6-24 HRS): SARS Coronavirus 2: NEGATIVE

## 2019-08-25 ENCOUNTER — Other Ambulatory Visit: Payer: Self-pay

## 2019-08-26 ENCOUNTER — Ambulatory Visit (INDEPENDENT_AMBULATORY_CARE_PROVIDER_SITE_OTHER): Payer: Managed Care, Other (non HMO) | Admitting: Critical Care Medicine

## 2019-08-26 ENCOUNTER — Encounter: Payer: Self-pay | Admitting: *Deleted

## 2019-08-26 VITALS — BP 122/86 | HR 106 | Temp 97.3°F | Ht 64.0 in | Wt 156.8 lb

## 2019-08-26 DIAGNOSIS — J4540 Moderate persistent asthma, uncomplicated: Secondary | ICD-10-CM | POA: Diagnosis not present

## 2019-08-26 DIAGNOSIS — R0789 Other chest pain: Secondary | ICD-10-CM

## 2019-08-26 LAB — PULMONARY FUNCTION TEST
DL/VA % pred: 107 %
DL/VA: 4.68 ml/min/mmHg/L
DLCO unc % pred: 91 %
DLCO unc: 19.51 ml/min/mmHg
FEF 25-75 Post: 3.53 L/sec
FEF 25-75 Pre: 3.17 L/sec
FEF2575-%Change-Post: 11 %
FEF2575-%Pred-Post: 135 %
FEF2575-%Pred-Pre: 121 %
FEV1-%Change-Post: 3 %
FEV1-%Pred-Post: 113 %
FEV1-%Pred-Pre: 110 %
FEV1-Post: 2.75 L
FEV1-Pre: 2.66 L
FEV1FVC-%Change-Post: 3 %
FEV1FVC-%Pred-Pre: 102 %
FEV6-%Change-Post: 0 %
FEV6-%Pred-Post: 109 %
FEV6-%Pred-Pre: 108 %
FEV6-Post: 3.17 L
FEV6-Pre: 3.14 L
FEV6FVC-%Change-Post: 0 %
FEV6FVC-%Pred-Post: 102 %
FEV6FVC-%Pred-Pre: 102 %
FVC-%Change-Post: 0 %
FVC-%Pred-Post: 106 %
FVC-%Pred-Pre: 105 %
FVC-Post: 3.17 L
FVC-Pre: 3.15 L
Post FEV1/FVC ratio: 87 %
Post FEV6/FVC ratio: 100 %
Pre FEV1/FVC ratio: 84 %
Pre FEV6/FVC Ratio: 100 %
RV % pred: 74 %
RV: 1.28 L
TLC % pred: 84 %
TLC: 4.28 L

## 2019-08-26 NOTE — Progress Notes (Signed)
Synopsis: Referred in November 2020 for chest tightness by self.  Subjective:   PATIENT ID: Sandra Moody GENDER: female DOB: November 16, 1970, MRN: [id number]  Chief Complaint  Patient presents with  . Follow-up    Patient is here for follow up and PFT. Patient is having fatigue that she has had since last visit.    Sandra Moody is a 49 year old woman who presents for follow-up of asthma.  She is continued on Symbicort twice daily, Singulair, Xyzal and Flonase.  She has been missing Flonase about every other night.  She has noticed that she mouth breathes more due to nasal congestion, but denies rhinorrhea.  Her main complaint is being fatigued for the last few months.  She denies wheezing, coughing, nocturnal asthma symptoms.  She was previously gabapentin, but stopped this.  She has been taking vitamin D, but has a history of deficiency.  No new medications.  She follows with Allergist Dr. Donneta Romberg.       OV 06/19/2019:  Sandra Moody is a 49 year old woman with a history of allergies and asthma since childhood who presents for evaluation of fatigue and substernal and left-sided chest pain.  She feels that her wheezing has been worse for the past few months despite taking her Symbicort and albuterol.  She has discussed this with her allergist (Dr. Donneta Romberg), who felt that this was unlikely due to her asthma and did not recommend nebulized breathing treatments.  Her wheezing is worse at night.  She has not been missing doses of Symbicort.  She has never required hospitalization for her asthma.  She has chronic allergies which she treats with Flonase, montelukast, levocetirizine, and allergy eyedrops.  She has had occasional coughing, which is not a typical asthma symptom for her.  She describes her chest pain as being substernal and left side, occasionally feeling like a shock, but sometimes feels like there is a brick on her chest and sharp.  The pain is worse when laying flat, and improves with sitting  up.  Otherwise nothing improves the pain.  The pain is periodic rather than constant, and resolves on its own.  She is not sure what caused the symptoms to develop initially, but they have become more frequent over time.  She has no personal history of cardiac disease, and had a grandfather who had heart disease.  She does not smoke or vape.  She has had GERD in the past, but her symptoms were different.  She has never had symptoms like this before.  She started a new medicine shortly before this began, pioglitazone for her diabetes.  No medications have been stopped.  There is no personal or family history of rheumatologic disease or DVT.  She has had 1 miscarriage in the past early in the pregnancy.  In the last few weeks she has required antibiotics twice-once for a thumb infection, once for a tooth abscess.  The tooth was pulled last week.     Past Medical History:  Diagnosis Date  . Acute renal disease   . Asthma   . Bilateral carpal tunnel syndrome 03/11/2019  . Common migraine with intractable migraine 08/29/2018  . Daily headache    (03/08/2017)  . Essential hypertension 02/20/2019  . GERD (gastroesophageal reflux disease)   . H pylori ulcer ~ 08/2016  . History of hiatal hernia   . Leaky heart valve ~ 09/2016  . Migraine    "maybe weekly" (03/08/2017)  . PONV (postoperative nausea and vomiting)   . Type 2  diabetes mellitus with hyperglycemia, without long-term current use of insulin ([facility]) 02/20/2019     Family History  Problem Relation Age of Onset  . Thyroid disease Mother   . Diabetes Maternal Grandmother   . Heart disease Maternal Grandfather      Past Surgical History:  Procedure Laterality Date  . CHOLECYSTECTOMY N/A 03/08/2017   Procedure: LAPAROSCOPIC CHOLECYSTECTOMY;  Surgeon: Coralie Keens, MD;  Location: [facility];  Service: General;  Laterality: N/A;  . DILATION AND CURETTAGE OF UTERUS    . LAPAROSCOPIC CHOLECYSTECTOMY  03/08/2017  . TUBAL LIGATION      Social  History   Socioeconomic History  . Marital status: Significant Other    Spouse name: Not on file  . Number of children: 3  . Years of education: Not on file  . Highest education level: 12th grade  Occupational History    Comment: [employer]   Tobacco Use  . Smoking status: Never Smoker  . Smokeless tobacco: Never Used  Substance and Sexual Activity  . Alcohol use: Yes    Comment: 03/08/2017 "might have a drink on a holiday"  . Drug use: No  . Sexual activity: Yes    Partners: Male    Birth control/protection: Injection  Other Topics Concern  . Not on file  Social History Narrative   ** Merged History Encounter **   Right handed    Lives at home with significant other 2 cups daily of caffeine    Social Determinants of Health   Financial Resource Strain:   . Difficulty of Paying Living Expenses: Not on file  Food Insecurity:   . Worried About Charity fundraiser in the Last Year: Not on file  . Ran Out of Food in the Last Year: Not on file  Transportation Needs:   . Lack of Transportation (Medical): Not on file  . Lack of Transportation (Non-Medical): Not on file  Physical Activity:   . Days of Exercise per Week: Not on file  . Minutes of Exercise per Session: Not on file  Stress:   . Feeling of Stress : Not on file  Social Connections:   . Frequency of Communication with Friends and Family: Not on file  . Frequency of Social Gatherings with Friends and Family: Not on file  . Attends Religious Services: Not on file  . Active Member of Clubs or Organizations: Not on file  . Attends Archivist Meetings: Not on file  . Marital Status: Not on file  Intimate Partner Violence:   . Fear of Current or Ex-Partner: Not on file  . Emotionally Abused: Not on file  . Physically Abused: Not on file  . Sexually Abused: Not on file     Allergies  Allergen Reactions  . Ginger Itching and Swelling  . Passion Fruit Flavor [Flavoring Agent] Itching and Swelling  .  Peanuts [Peanut Oil] Anaphylaxis    PT ALLERGIC TO ALL TREE NUTS  . Shellfish Allergy Anaphylaxis  . Tamiflu [Oseltamivir Phosphate]      Immunization History  Administered Date(s) Administered  . Influenza,inj,Quad PF,6+ Mos 06/19/2019  . Pneumococcal Polysaccharide-23 03/09/2017    Outpatient Medications Prior to Visit  Medication Sig Dispense Refill  . ACCU-CHEK GUIDE test strip USE TO CHECK GLUCOSE TWICE DAILY,FASTING AND 2 HOURS AFTER DINNER 100 strip 5  . ALBUTEROL SULFATE PO Take by mouth.    Marland Kitchen amLODipine (NORVASC) 2.5 MG tablet TAKE 1 TABLET BY MOUTH EVERY DAY 30 tablet 2  . blood  glucose meter kit and supplies Per insurance preference. Use to check glucose twice a day (fasting and 2 hours after dinner)  Dx E11.65 1 each 5  . chlorhexidine (PERIDEX) 0.12 % solution Use as directed 15 mLs in the mouth or throat 2 (two) times daily. 120 mL 1  . EPIPEN 2-PAK 0.3 MG/0.3ML SOAJ injection Inject 0.3 mg into the muscle as needed (for allergic reaction).   2  . fluticasone (FLONASE) 50 MCG/ACT nasal spray USE 1 2 SPRAYS IN EACH NOSTRIL ONCE A DAY    . ibuprofen (ADVIL) 800 MG tablet Take 800 mg by mouth 4 (four) times daily as needed.    Marland Kitchen imipramine (TOFRANIL-PM) 100 MG capsule Take 1 capsule (100 mg total) by mouth at bedtime. 90 capsule 3  . ketoconazole (NIZORAL) 2 % shampoo Apply 1 application topically 2 (two) times a week. Apply to affected area lather, leave in place for 5 mins and then rinse off with water 120 mL 5  . levocetirizine (XYZAL) 5 MG tablet 1 2 TABLET IN THE EVENING ONCE A DAY ORALLY 15    . lidocaine (XYLOCAINE) 2 % solution 5-15 mL gurgle as needed 150 mL 0  . medroxyPROGESTERone (DEPO-PROVERA) 150 MG/ML injection     . montelukast (SINGULAIR) 10 MG tablet Take 10 mg by mouth at bedtime.    Marland Kitchen olopatadine (PATANOL) 0.1 % ophthalmic solution Place 1 drop into both eyes 2 (two) times daily. 5 mL 12  . rizatriptan (MAXALT) 10 MG tablet Take 10 mg by mouth as needed  for migraine. May repeat in 2 hours if needed    . rizatriptan (MAXALT) 10 MG tablet TAKE 1 TABLET (10 MG TOTAL) BY MOUTH 3 (THREE) TIMES DAILY AS NEEDED FOR MIGRAINE. 6 tablet 6  . SYMBICORT 160-4.5 MCG/ACT inhaler INHALE 2 PUFFS INTO THE LUNGS DAILY. 10.2 Inhaler 0  . gabapentin (NEURONTIN) 100 MG capsule TAKE 2 CAPSULES (200 MG TOTAL) BY MOUTH 3 (THREE) TIMES DAILY. 150 capsule 2   No facility-administered medications prior to visit.    Review of Systems  Constitutional: Positive for chills and malaise/fatigue. Negative for fever and weight loss.  HENT: Negative.   Respiratory: Positive for wheezing. Negative for cough.   Cardiovascular: Negative for chest pain and leg swelling.  Gastrointestinal: Positive for diarrhea and nausea.  Genitourinary: Negative.   Musculoskeletal: Negative for joint pain and myalgias.       L chest pain  Skin: Negative for rash.  Endo/Heme/Allergies: Positive for environmental allergies.     Objective:   Vitals:   08/26/19 1557  BP: 122/86  Pulse: (!) 106  Temp: (!) 97.3 F (36.3 C)  TempSrc: Temporal  SpO2: 98%  Weight: 156 lb 12.8 oz (71.1 kg)  Height: 5' 4"  (1.626 m)   98% on   RA BMI Readings from Last 3 Encounters:  08/26/19 26.91 kg/m  08/07/19 27.29 kg/m  07/31/19 27.81 kg/m   Wt Readings from Last 3 Encounters:  08/26/19 156 lb 12.8 oz (71.1 kg)  08/07/19 159 lb (72.1 kg)  07/31/19 162 lb (73.5 kg)    Physical Exam Vitals reviewed.  Constitutional:      General: She is not in acute distress.    Appearance: She is not ill-appearing or diaphoretic.     Comments: Fatigued appearing  HENT:     Head: Normocephalic and atraumatic.     Nose:     Comments: Deferred due to masking requirement.    Mouth/Throat:     Comments:  Deferred due to masking requirement. Eyes:     General: No scleral icterus. Cardiovascular:     Rate and Rhythm: Normal rate and regular rhythm.     Heart sounds: No murmur. No friction rub.    Pulmonary:     Comments: Breathing comfortably on room air, no conversational dyspnea or tachypnea.  Clear to auscultation bilaterally.  No coughing with deep inspiration. Abdominal:     General: There is no distension.     Palpations: Abdomen is soft.     Tenderness: There is no abdominal tenderness.  Musculoskeletal:        General: No swelling or deformity.     Cervical back: Neck supple.  Lymphadenopathy:     Cervical: No cervical adenopathy.  Skin:    General: Skin is warm and dry.     Findings: No rash.  Neurological:     Mental Status: She is alert.     Motor: No weakness.     Coordination: Coordination normal.  Psychiatric:        Behavior: Behavior normal.     Comments: Flattened affect      CBC    Component Value Date/Time   WBC 6.7 07/31/2019 0328   RBC 4.18 07/31/2019 0328   HGB 13.0 07/31/2019 0328   HGB 13.0 01/28/2019 1416   HCT 40.5 07/31/2019 0328   HCT 38.6 01/28/2019 1416   PLT 357 07/31/2019 0328   PLT 378 01/28/2019 1416   MCV 96.9 07/31/2019 0328   MCV 92 01/28/2019 1416   MCH 31.1 07/31/2019 0328   MCHC 32.1 07/31/2019 0328   RDW 13.4 07/31/2019 0328   RDW 12.6 01/28/2019 1416   LYMPHSABS 2.4 06/19/2019 1632   LYMPHSABS 2.6 01/28/2019 1416   MONOABS 0.6 06/19/2019 1632   EOSABS 0.1 06/19/2019 1632   EOSABS 0.1 01/28/2019 1416   BASOSABS 0.1 06/19/2019 1632   BASOSABS 0.0 01/28/2019 1416    CMP     Component Value Date/Time   NA 140 08/07/2019 0851   K 3.7 08/07/2019 0851   CL 103 08/07/2019 0851   CO2 23 08/07/2019 0851   GLUCOSE 88 08/07/2019 0851   GLUCOSE 121 (H) 07/31/2019 0328   BUN 10 08/07/2019 0851   CREATININE 1.06 (H) 08/07/2019 0851   CALCIUM 9.7 08/07/2019 0851   PROT 6.9 07/31/2019 0328   PROT 6.9 05/23/2019 0919   ALBUMIN 3.7 07/31/2019 0328   ALBUMIN 4.2 05/23/2019 0919   AST 20 07/31/2019 0328   ALT 18 07/31/2019 0328   ALKPHOS 62 07/31/2019 0328   BILITOT 0.6 07/31/2019 0328   BILITOT 0.3 05/23/2019 0919    GFRNONAA 63 08/07/2019 0851   GFRAA 73 08/07/2019 0851    IGE 117   Chest Imaging- films reviewed: CXR, 2 view 05/23/2019-no opacities  Pulmonary Functions Testing Results: PFT Results Latest Ref Rng & Units 08/26/2019  FVC-Pre L 3.15  FVC-Predicted Pre % 105  FVC-Post L 3.17  FVC-Predicted Post % 106  Pre FEV1/FVC % % 84  Post FEV1/FCV % % 87  FEV1-Pre L 2.66  FEV1-Predicted Pre % 110  FEV1-Post L 2.75  DLCO UNC% % 91  DLCO COR %Predicted % 107  TLC L 4.28  TLC % Predicted % 84  RV % Predicted % 74   No significant obstruction or response to bronchodilators.  No significant restriction or air trapping.  Normal diffusion.  Normal flow volume loop.  Echocardiogram: LVEF 55 to 88%, normal diastolic function. Normal LA, RV, RA.  Trivial  TR, otherwise normal valves.  EKG 06/19/19: Normal sinus rhythm, normal axis, normal intervals.  Early R wave progression across precordials, including R waves in V1.  T wave inversion in lead III, similar to previous EKG from October 2020 in January 2019.  No other ST segment deviation, T wave inversion, or deviation of PR interval.      Assessment & Plan:     ICD-10-CM   1. Moderate persistent asthma without complication  L24.40    Moderate persistent allergic asthma-PFTs normal, symptoms well controlled. -Continue Symbicort twice daily -Recommend Covid vaccine when it is available to her -Up-to-date on seasonal flu vaccine -Optimize allergic rhinosinusitis management -Continue Covid precautions-social distancing, mask wearing, handwashing  Allergic rhinosinusitis -Continue montelukast, Xyzal -Continue Flonase-enforced the importance of compliance with daily use.  Recommend switching to morning dosing to help with remembering to use it regularly. -Continue to follow-up with Dr. Donneta Romberg as prescribed   RTC in 6 months.  28 minutes were spent on this encounter including record review, face-to-face time, charting.   Current  Outpatient Medications:  .  ACCU-CHEK GUIDE test strip, USE TO CHECK GLUCOSE TWICE DAILY,FASTING AND 2 HOURS AFTER DINNER, Disp: 100 strip, Rfl: 5 .  ALBUTEROL SULFATE PO, Take by mouth., Disp: , Rfl:  .  amLODipine (NORVASC) 2.5 MG tablet, TAKE 1 TABLET BY MOUTH EVERY DAY, Disp: 30 tablet, Rfl: 2 .  blood glucose meter kit and supplies, Per insurance preference. Use to check glucose twice a day (fasting and 2 hours after dinner)  Dx E11.65, Disp: 1 each, Rfl: 5 .  chlorhexidine (PERIDEX) 0.12 % solution, Use as directed 15 mLs in the mouth or throat 2 (two) times daily., Disp: 120 mL, Rfl: 1 .  EPIPEN 2-PAK 0.3 MG/0.3ML SOAJ injection, Inject 0.3 mg into the muscle as needed (for allergic reaction). , Disp: , Rfl: 2 .  fluticasone (FLONASE) 50 MCG/ACT nasal spray, USE 1 2 SPRAYS IN EACH NOSTRIL ONCE A DAY, Disp: , Rfl:  .  ibuprofen (ADVIL) 800 MG tablet, Take 800 mg by mouth 4 (four) times daily as needed., Disp: , Rfl:  .  imipramine (TOFRANIL-PM) 100 MG capsule, Take 1 capsule (100 mg total) by mouth at bedtime., Disp: 90 capsule, Rfl: 3 .  ketoconazole (NIZORAL) 2 % shampoo, Apply 1 application topically 2 (two) times a week. Apply to affected area lather, leave in place for 5 mins and then rinse off with water, Disp: 120 mL, Rfl: 5 .  levocetirizine (XYZAL) 5 MG tablet, 1 2 TABLET IN THE EVENING ONCE A DAY ORALLY 15, Disp: , Rfl:  .  lidocaine (XYLOCAINE) 2 % solution, 5-15 mL gurgle as needed, Disp: 150 mL, Rfl: 0 .  medroxyPROGESTERone (DEPO-PROVERA) 150 MG/ML injection, , Disp: , Rfl:  .  montelukast (SINGULAIR) 10 MG tablet, Take 10 mg by mouth at bedtime., Disp: , Rfl:  .  olopatadine (PATANOL) 0.1 % ophthalmic solution, Place 1 drop into both eyes 2 (two) times daily., Disp: 5 mL, Rfl: 12 .  rizatriptan (MAXALT) 10 MG tablet, Take 10 mg by mouth as needed for migraine. May repeat in 2 hours if needed, Disp: , Rfl:  .  rizatriptan (MAXALT) 10 MG tablet, TAKE 1 TABLET (10 MG TOTAL) BY  MOUTH 3 (THREE) TIMES DAILY AS NEEDED FOR MIGRAINE., Disp: 6 tablet, Rfl: 6 .  SYMBICORT 160-4.5 MCG/ACT inhaler, INHALE 2 PUFFS INTO THE LUNGS DAILY., Disp: 10.2 Inhaler, Rfl: 0 .  gabapentin (NEURONTIN) 100 MG capsule, TAKE 2 CAPSULES (200  MG TOTAL) BY MOUTH 3 (THREE) TIMES DAILY., Disp: 150 capsule, Rfl: 2   Julian Hy, DO [facility] 08/26/2019 4:14 PM

## 2019-08-26 NOTE — Patient Instructions (Signed)
Thank you for visiting Dr. Chestine Spore at [facility]. We recommend the following:  Keep all the meds the same. Start taking Flonase in the morning.   Return in about 6 months (around 02/23/2020).    Please do your part to reduce the spread of COVID-19.

## 2019-08-26 NOTE — Progress Notes (Signed)
PFT done today. 

## 2019-08-27 ENCOUNTER — Encounter: Payer: Self-pay | Admitting: *Deleted

## 2019-08-28 ENCOUNTER — Ambulatory Visit: Payer: Managed Care, Other (non HMO) | Admitting: Neurology

## 2019-08-29 ENCOUNTER — Ambulatory Visit: Payer: Managed Care, Other (non HMO) | Admitting: Internal Medicine

## 2019-09-05 ENCOUNTER — Other Ambulatory Visit: Payer: Self-pay

## 2019-09-05 ENCOUNTER — Ambulatory Visit (INDEPENDENT_AMBULATORY_CARE_PROVIDER_SITE_OTHER): Payer: Managed Care, Other (non HMO) | Admitting: Internal Medicine

## 2019-09-05 ENCOUNTER — Encounter: Payer: Self-pay | Admitting: Internal Medicine

## 2019-09-05 VITALS — BP 124/82 | HR 102 | Temp 98.3°F | Ht 64.0 in | Wt 156.8 lb

## 2019-09-05 DIAGNOSIS — E119 Type 2 diabetes mellitus without complications: Secondary | ICD-10-CM | POA: Diagnosis not present

## 2019-09-05 DIAGNOSIS — R739 Hyperglycemia, unspecified: Secondary | ICD-10-CM | POA: Diagnosis not present

## 2019-09-05 LAB — GLUCOSE, POCT (MANUAL RESULT ENTRY): POC Glucose: 211 mg/dl — AB (ref 70–99)

## 2019-09-05 NOTE — Patient Instructions (Signed)
-   Fasting glucose level goal  below 140 mg/dL     Choose healthy, lower carb lower calorie snacks: toss salad, cooked vegetables, cottage cheese, peanut butter, low fat cheese / string cheese, lower sodium deli meat, tuna salad or chicken salad    Try and check sugar before breakfast and bedtime when you can

## 2019-09-05 NOTE — Progress Notes (Signed)
Name: Sandra Moody  MRN/ DOB: [id number], Oct 20, 1970   Age/ Sex: 49 y.o., female    PCP: Rutherford Guys, MD   Reason for Endocrinology Evaluation: Type 2 Diabetes Mellitus     Date of Initial Endocrinology Visit: 09/05/2019     PATIENT IDENTIFIER: Sandra Moody is a 49 y.o. female with a past medical history of Asthma. The patient presented for initial endocrinology clinic visit on 09/05/2019 for consultative assistance with her diabetes management.    HPI: Sandra Moody was    Diagnosed with DM in 09/2018 Prior Medications tried/Intolerance: Glipizide- stomach issues  , metformin- stomach issues , actos (stopped 07/2019)- hypoglycemia  Currently checking blood sugars occasionally    Hypoglycemia episodes : yes    ( has them low in the 50's)  Symptoms: shaky                 Frequency:none since stopping  The actos.   Hemoglobin A1c has ranged from 6.1% in 08/2019, peaking at 10.3%  in 2020 Patient required assistance for hypoglycemia:  Patient has required hospitalization within the last 1 year from hyper or hypoglycemia: Yes on 07/31/2019 with "Subjective hypoglycemia" . Serum glucose 121.   In terms of diet, the patient eats 3 meals and a couple snacks.,    HOME DIABETES REGIMEN: N/A   Statin: No ACE-I/ARB: No Prior Diabetic Education: Yes   METER DOWNLOAD SUMMARY: Did not bring    DIABETIC COMPLICATIONS: Microvascular complications:    Denies: neuropathy , retinopathy,   Last eye exam: Completed 09/2018  Macrovascular complications:    Denies: CAD, PVD, CVA   PAST HISTORY: Past Medical History:  Past Medical History:  Diagnosis Date  . Acute renal disease   . Asthma   . Bilateral carpal tunnel syndrome 03/11/2019  . Common migraine with intractable migraine 08/29/2018  . Daily headache    (03/08/2017)  . Essential hypertension 02/20/2019  . GERD (gastroesophageal reflux disease)   . H pylori ulcer ~ 08/2016  . History of hiatal hernia   . Leaky heart  valve ~ 09/2016  . Migraine    "maybe weekly" (03/08/2017)  . PONV (postoperative nausea and vomiting)   . Type 2 diabetes mellitus with hyperglycemia, without long-term current use of insulin ([place]) 02/20/2019    Past Surgical History:  Past Surgical History:  Procedure Laterality Date  . CHOLECYSTECTOMY N/A 03/08/2017   Procedure: LAPAROSCOPIC CHOLECYSTECTOMY;  Surgeon: Coralie Keens, MD;  Location: [facility];  Service: General;  Laterality: N/A;  . DILATION AND CURETTAGE OF UTERUS    . LAPAROSCOPIC CHOLECYSTECTOMY  03/08/2017  . TUBAL LIGATION        Social History:  reports that she has never smoked. She has never used smokeless tobacco. She reports current alcohol use. She reports that she does not use drugs. Family History:  Family History  Problem Relation Age of Onset  . Thyroid disease Mother   . Diabetes Maternal Grandmother   . Heart disease Maternal Grandfather       HOME MEDICATIONS: Allergies as of 09/05/2019      Reactions   Ginger Itching, Swelling   Passion Fruit Flavor [flavoring Agent] Itching, Swelling   Peanuts [peanut Oil] Anaphylaxis   PT ALLERGIC TO ALL TREE NUTS   Shellfish Allergy Anaphylaxis   Tamiflu [oseltamivir Phosphate]       Medication List       Accurate as of September 05, 2019  2:02 PM. If you have any questions, ask  your nurse or doctor.        Accu-Chek Guide test strip Generic drug: glucose blood USE TO CHECK GLUCOSE TWICE DAILY,FASTING AND 2 HOURS AFTER DINNER   ALBUTEROL SULFATE PO Take by mouth.   amLODipine 2.5 MG tablet Commonly known as: NORVASC TAKE 1 TABLET BY MOUTH EVERY DAY   blood glucose meter kit and supplies Per insurance preference. Use to check glucose twice a day (fasting and 2 hours after dinner)  Dx E11.65   chlorhexidine 0.12 % solution Commonly known as: Peridex Use as directed 15 mLs in the mouth or throat 2 (two) times daily.   EpiPen 2-Pak 0.3 mg/0.3 mL Soaj injection Generic drug:  EPINEPHrine Inject 0.3 mg into the muscle as needed (for allergic reaction).   fluticasone 50 MCG/ACT nasal spray Commonly known as: FLONASE USE 1 2 SPRAYS IN EACH NOSTRIL ONCE A DAY   ibuprofen 800 MG tablet Commonly known as: ADVIL Take 800 mg by mouth 4 (four) times daily as needed.   imipramine 100 MG capsule Commonly known as: TOFRANIL-PM Take 1 capsule (100 mg total) by mouth at bedtime.   ketoconazole 2 % shampoo Commonly known as: NIZORAL Apply 1 application topically 2 (two) times a week. Apply to affected area lather, leave in place for 5 mins and then rinse off with water   levocetirizine 5 MG tablet Commonly known as: XYZAL 1 2 TABLET IN THE EVENING ONCE A DAY ORALLY 15   lidocaine 2 % solution Commonly known as: XYLOCAINE 5-15 mL gurgle as needed   medroxyPROGESTERone 150 MG/ML injection Commonly known as: DEPO-PROVERA   montelukast 10 MG tablet Commonly known as: SINGULAIR Take 10 mg by mouth at bedtime.   olopatadine 0.1 % ophthalmic solution Commonly known as: Patanol Place 1 drop into both eyes 2 (two) times daily.   rizatriptan 10 MG tablet Commonly known as: MAXALT Take 10 mg by mouth as needed for migraine. May repeat in 2 hours if needed   rizatriptan 10 MG tablet Commonly known as: MAXALT TAKE 1 TABLET (10 MG TOTAL) BY MOUTH 3 (THREE) TIMES DAILY AS NEEDED FOR MIGRAINE.   Symbicort 160-4.5 MCG/ACT inhaler Generic drug: budesonide-formoterol INHALE 2 PUFFS INTO THE LUNGS DAILY.        ALLERGIES: Allergies  Allergen Reactions  . Ginger Itching and Swelling  . Passion Fruit Flavor [Flavoring Agent] Itching and Swelling  . Peanuts [Peanut Oil] Anaphylaxis    PT ALLERGIC TO ALL TREE NUTS  . Shellfish Allergy Anaphylaxis  . Tamiflu [Oseltamivir Phosphate]      REVIEW OF SYSTEMS: A comprehensive ROS was conducted with the patient and is negative except as per HPI and below:  Review of Systems  Gastrointestinal: Negative for diarrhea  and nausea.  Neurological: Positive for tingling. Negative for tremors.      OBJECTIVE:   VITAL SIGNS: BP 124/82 (BP Location: Left Arm, Patient Position: Sitting, Cuff Size: Normal)   Pulse (!) 102   Temp 98.3 F (36.8 C)   Ht 5' 4"  (1.626 m)   Wt 156 lb 12.8 oz (71.1 kg)   SpO2 98%   BMI 26.91 kg/m    PHYSICAL EXAM:  General: Pt appears well and is in NAD  HEENT:  Eyes: External eye exam normal without stare, lid lag or exophthalmos.  EOM intact.   Neck: General: Supple without adenopathy or carotid bruits. Thyroid: Thyroid size normal.  No goiter or nodules appreciated. No thyroid bruit.  Lungs: Clear with good BS bilat with no  rales, rhonchi, or wheezes  Heart: RRR with normal S1 and S2 and no gallops; no murmurs; no rub  Abdomen: Normoactive bowel sounds, soft, nontender, without masses or organomegaly palpable  Extremities:  Lower extremities - No pretibial edema. No lesions.  Skin: Normal texture and temperature to palpation. No rash noted. No Acanthosis nigricans/skin tags. No lipohypertrophy.  Neuro: MS is good with appropriate affect, pt is alert and Ox3    DM foot exam: 09/05/2019  The skin of the feet is intact without sores or ulcerations. The pedal pulses are 2+ on right and 2+ on left. The sensation is intact to a screening 5.07, 10 gram monofilament bilaterally    DATA REVIEWED:  Lab Results  Component Value Date   HGBA1C 6.1 (A) 08/07/2019   HGBA1C 6.7 (A) 02/20/2019   HGBA1C 10.3 (H) 10/01/2018   Lab Results  Component Value Date   LDLCALC 72 08/07/2019   CREATININE 1.06 (H) 08/07/2019   Lab Results  Component Value Date   MICRALBCREAT 14 11/18/2018    Lab Results  Component Value Date   CHOL 124 08/07/2019   HDL 35 (L) 08/07/2019   LDLCALC 72 08/07/2019   TRIG 86 08/07/2019   CHOLHDL 3.5 08/07/2019        ASSESSMENT / PLAN / RECOMMENDATIONS:   1) Type2 Diabetes Mellitus, Optimally controlled, Without complications - Most recent A1c  of 6.1 %. Goal A1c < 7.0 %.   Plan: GENERAL: I have discussed with the patient the pathophysiology of diabetes. We went over the natural progression of the disease. We talked about both insulin resistance and insulin deficiency. We stressed the importance of lifestyle changes including diet and exercise. I explained the complications associated with diabetes including retinopathy, nephropathy, neuropathy as well as increased risk of cardiovascular disease. We went over the benefit seen with glycemic control.    I explained to the patient that diabetic patients are at higher than normal risk for amputations.  Patient with fear of hypoglycemia, she used to have BG's in the 50s, but her latest ED visit with a BG in the low 100s.  I have explained the 2 definition of hypoglycemia with a BG of less than 70 mg/DL, patient states that she does feel shaky and jittery with BG's in the 100s, I have tried to explain to her that the reason for the symptoms is the fact that her body has gotten used to BG's in the 200s, 1 glucose is reduced rapidly from the 200s to the 100s and is not uncommon for patients to have symptoms of shaking etc. we also discussed the right way to improve the symptoms to gradually improve her glycemic control.  I also discussed the importance of lifestyle changes, the patient currently eats 3 meals a day and snacks twice a day, she also drinks sugar sweetened beverages, we did discuss eating 3 meals a day, avoiding sugar sweetened beverages, and avoiding snacks when possible, we also discussed options for low carb snacks.  I have discussed with her that we will see her back in 3 months and recheck her A1c  We discussed the importance of BG checks at home and availability of that data to me in the future  Her in office BG today was 211 mg/DL-this was an hour and a half postprandial  At the end of her visit the patient stated that she would not go on pills  anymore  MEDICATIONS:  N/A  EDUCATION / INSTRUCTIONS:  BG monitoring instructions: Patient is  instructed to check her blood sugars 2 times a day, fasting and bedtime.  Call [facility] if: BG persistently < 70 or > 300. . I reviewed the Rule of 15 for the treatment of hypoglycemia in detail with the patient. Literature supplied.   2) Diabetic complications:   Eye: Does not have known diabetic retinopathy.   Neuro/ Feet: Does not have known diabetic peripheral neuropathy.  Renal: Patient does not have known baseline CKD. She is not on an ACEI/ARB at present.   Follow-up in 3 months      Signed electronically by: Mack Guise, MD  [facility]  [address] [place], [place] [address] Phone: [phone] FAX: [phone]   CC: Rutherford Guys, MD [address] [place] [address] Phone: [phone]  Fax: [phone]    Return to Endocrinology clinic as below: Future Appointments  Date Time Provider [place]  09/05/2019  2:20 PM Carie Kapuscinski, Melanie Crazier, MD [id number] None

## 2019-09-12 ENCOUNTER — Encounter: Payer: Self-pay | Admitting: Family Medicine

## 2019-09-12 ENCOUNTER — Other Ambulatory Visit: Payer: Self-pay

## 2019-09-12 ENCOUNTER — Ambulatory Visit (INDEPENDENT_AMBULATORY_CARE_PROVIDER_SITE_OTHER): Payer: Managed Care, Other (non HMO) | Admitting: Family Medicine

## 2019-09-12 VITALS — BP 144/81 | HR 105 | Temp 97.8°F | Ht 64.0 in | Wt 158.2 lb

## 2019-09-12 DIAGNOSIS — E1165 Type 2 diabetes mellitus with hyperglycemia: Secondary | ICD-10-CM | POA: Diagnosis not present

## 2019-09-12 NOTE — Progress Notes (Signed)
2/12/20214:37 PM  Sandra Moody 1971/02/22, 49 y.o., female [id number]  Chief Complaint  Patient presents with  . Diabetes    f/u . blood sugar going up and down     HPI:   Patient is a 49 y.o. female with past medical history significant for DM2,HTN, migraines, asthma, GERD, biliary dyskinesia,who presents today for concerns of glucose variability  Last OV a month ago Stopped meds due to recurrent hypoglycemia, referred to endo Saw endo a week ago, no meds started as last a1c was at goal, recheck in 3 months Saw pulm jan 2020 - pfts normal, showed good asthma control in current regime  Having glucose variability Yesterday around 9am 87, made her shaky, dropping objects, had to leave work, she ate peanut butter crackers, recheck 198 about an hour  At home glucometer 7 day avg 146 14 day avg 145 30 day avg 142 However for past several days waking up around 4am with headaches and cbgs > 180  At work glucometer 7 day avg 185  Diet limited due to food preference, carb heavy  She denies any cbgs < 70 She does not want to start meds again   Lab Results  Component Value Date   HGBA1C 6.1 (A) 08/07/2019   HGBA1C 6.7 (A) 02/20/2019   HGBA1C 10.3 (H) 10/01/2018   Lab Results  Component Value Date   LDLCALC 72 08/07/2019   CREATININE 1.06 (H) 08/07/2019    Depression screen PHQ 2/9 09/12/2019 05/23/2019 02/20/2019  Decreased Interest 0 0 0  Down, Depressed, Hopeless 0 0 0  PHQ - 2 Score 0 0 0    Fall Risk  09/12/2019 05/23/2019 02/20/2019 01/28/2019 12/25/2018  Falls in the past year? 0 0 0 0 0  Number falls in past yr: 0 0 0 - -  Injury with Fall? 0 0 0 - -  Follow up Falls evaluation completed - Falls evaluation completed - -     Allergies  Allergen Reactions  . Ginger Itching and Swelling  . Passion Fruit Flavor [Flavoring Agent] Itching and Swelling  . Peanuts [Peanut Oil] Anaphylaxis    PT ALLERGIC TO ALL TREE NUTS  . Shellfish Allergy Anaphylaxis  .  Tamiflu [Oseltamivir Phosphate]     Prior to Admission medications   Medication Sig Start Date End Date Taking? Authorizing Provider  ACCU-CHEK GUIDE test strip USE TO CHECK GLUCOSE TWICE DAILY,FASTING AND 2 HOURS AFTER DINNER 07/15/19  Yes Rutherford Guys, MD  ALBUTEROL SULFATE PO Take by mouth.   Yes [provider]  amLODipine (NORVASC) 2.5 MG tablet TAKE 1 TABLET BY MOUTH EVERY DAY 07/15/19  Yes Rutherford Guys, MD  blood glucose meter kit and supplies Per insurance preference. Use to check glucose twice a day (fasting and 2 hours after dinner)  Dx E11.65 10/30/18  Yes Rutherford Guys, MD  EPIPEN 2-PAK 0.3 MG/0.3ML SOAJ injection Inject 0.3 mg into the muscle as needed (for allergic reaction).  05/18/16  Yes [provider]  fluticasone (FLONASE) 50 MCG/ACT nasal spray USE 1 2 SPRAYS IN EACH NOSTRIL ONCE A DAY 10/25/18  Yes [provider]  ibuprofen (ADVIL) 800 MG tablet Take 800 mg by mouth 4 (four) times daily as needed. 05/16/19  Yes [provider]  ketoconazole (NIZORAL) 2 % shampoo Apply 1 application topically 2 (two) times a week. Apply to affected area lather, leave in place for 5 mins and then rinse off with water 05/05/19  Yes Rutherford Guys, MD  levocetirizine (XYZAL) 5 MG tablet 1 2 TABLET IN THE EVENING ONCE A DAY ORALLY 15 11/05/18  Yes [provider]  medroxyPROGESTERone (DEPO-PROVERA) 150 MG/ML injection  08/15/18  Yes [provider]  montelukast (SINGULAIR) 10 MG tablet Take 10 mg by mouth at bedtime.   Yes [provider]  olopatadine (PATANOL) 0.1 % ophthalmic solution Place 1 drop into both eyes 2 (two) times daily. 12/25/18  Yes Corum, Rex Kras, MD  rizatriptan (MAXALT) 10 MG tablet TAKE 1 TABLET (10 MG TOTAL) BY MOUTH 3 (THREE) TIMES DAILY AS NEEDED FOR MIGRAINE. 06/02/19  Yes Kathrynn Ducking, MD  SYMBICORT 160-4.5 MCG/ACT inhaler INHALE 2 PUFFS INTO THE LUNGS DAILY. 05/09/18  Yes Valentina Shaggy, MD    imipramine (TOFRANIL-PM) 100 MG capsule Take 1 capsule (100 mg total) by mouth at bedtime. Patient not taking: Reported on 09/12/2019 04/30/19   Kathrynn Ducking, MD    Past Medical History:  Diagnosis Date  . Acute renal disease   . Asthma   . Bilateral carpal tunnel syndrome 03/11/2019  . Common migraine with intractable migraine 08/29/2018  . Daily headache    (03/08/2017)  . Essential hypertension 02/20/2019  . GERD (gastroesophageal reflux disease)   . H pylori ulcer ~ 08/2016  . History of hiatal hernia   . Leaky heart valve ~ 09/2016  . Migraine    "maybe weekly" (03/08/2017)  . PONV (postoperative nausea and vomiting)   . Type 2 diabetes mellitus with hyperglycemia, without long-term current use of insulin ([place]) 02/20/2019    Past Surgical History:  Procedure Laterality Date  . CHOLECYSTECTOMY N/A 03/08/2017   Procedure: LAPAROSCOPIC CHOLECYSTECTOMY;  Surgeon: Coralie Keens, MD;  Location: [facility];  Service: General;  Laterality: N/A;  . DILATION AND CURETTAGE OF UTERUS    . LAPAROSCOPIC CHOLECYSTECTOMY  03/08/2017  . TUBAL LIGATION      Social History   Tobacco Use  . Smoking status: Never Smoker  . Smokeless tobacco: Never Used  Substance Use Topics  . Alcohol use: Yes    Comment: 03/08/2017 "might have a drink on a holiday"    Family History  Problem Relation Age of Onset  . Thyroid disease Mother   . Diabetes Maternal Grandmother   . Heart disease Maternal Grandfather     ROS Per hpi  OBJECTIVE:  Today's Vitals   09/12/19 1605  BP: (!) 144/81  Pulse: (!) 105  Temp: 97.8 F (36.6 C)  TempSrc: Temporal  SpO2: 99%  Weight: 158 lb 3.2 oz (71.8 kg)  Height: 5' 4"  (1.626 m)   Body mass index is 27.15 kg/m.   Physical Exam Vitals and nursing note reviewed.  Constitutional:      Appearance: She is well-developed.  HENT:     Head: Normocephalic and atraumatic.  Eyes:     General: No scleral icterus.    Conjunctiva/sclera: Conjunctivae normal.      Pupils: Pupils are equal, round, and reactive to light.  Pulmonary:     Effort: Pulmonary effort is normal.  Musculoskeletal:     Cervical back: Neck supple.  Skin:    General: Skin is warm and dry.  Neurological:     Mental Status: She is alert and oriented to person, place, and time.     No results found for this or any previous visit (from the past 24 hour(s)).  No results found.   ASSESSMENT and PLAN  1. Type 2 diabetes mellitus with hyperglycemia, without long-term current use of  insulin ([place]) More than 50% of 25 min visit was spent in education re hypoglycemia ssx and mgt. Discussed that she is having sx at low end normal range of cbgs, discussed overcorrection, discussed increasing fiber and protein, frequent small meals and low gylcemic index foods, discussed use of glucose tabs  Return in about 4 weeks (around 10/10/2019).    Rutherford Guys, MD [facility] at [place], [place] [address] Ph.  [phone] Fax [phone]

## 2019-09-12 NOTE — Patient Instructions (Signed)
° ° ° °  If you have lab work done today you will be contacted with your lab results within the next 2 weeks.  If you have not heard from us then please contact us. The fastest way to get your results is to register for My Chart. ° ° °IF you received an x-ray today, you will receive an invoice from [facility]. Please contact [facility] at [phone] with questions or concerns regarding your invoice.  ° °IF you received labwork today, you will receive an invoice from [facility]. Please contact [facility] at [phone] with questions or concerns regarding your invoice.  ° °Our billing staff will not be able to assist you with questions regarding bills from these companies. ° °You will be contacted with the lab results as soon as they are available. The fastest way to get your results is to activate your My Chart account. Instructions are located on the last page of this paperwork. If you have not heard from us regarding the results in 2 weeks, please contact this office. °  ° ° ° °

## 2019-09-15 ENCOUNTER — Encounter: Payer: Self-pay | Admitting: Family Medicine

## 2019-10-10 ENCOUNTER — Other Ambulatory Visit: Payer: Self-pay

## 2019-10-10 ENCOUNTER — Ambulatory Visit (INDEPENDENT_AMBULATORY_CARE_PROVIDER_SITE_OTHER): Payer: Managed Care, Other (non HMO) | Admitting: Family Medicine

## 2019-10-10 ENCOUNTER — Encounter: Payer: Self-pay | Admitting: Family Medicine

## 2019-10-10 VITALS — BP 122/80 | HR 102 | Temp 98.1°F | Resp 18 | Ht 64.0 in | Wt 155.0 lb

## 2019-10-10 DIAGNOSIS — M545 Low back pain, unspecified: Secondary | ICD-10-CM

## 2019-10-10 DIAGNOSIS — E1165 Type 2 diabetes mellitus with hyperglycemia: Secondary | ICD-10-CM | POA: Diagnosis not present

## 2019-10-10 DIAGNOSIS — Z23 Encounter for immunization: Secondary | ICD-10-CM | POA: Diagnosis not present

## 2019-10-10 LAB — POCT URINALYSIS DIP (MANUAL ENTRY)
Bilirubin, UA: NEGATIVE
Blood, UA: NEGATIVE
Glucose, UA: NEGATIVE mg/dL
Leukocytes, UA: NEGATIVE
Nitrite, UA: NEGATIVE
Protein Ur, POC: 30 mg/dL — AB
Spec Grav, UA: >=1.03 — AB (ref 1.010–1.025)
Urobilinogen, UA: 0.2 E.U./dL
pH, UA: 6 (ref 5.0–8.0)

## 2019-10-10 NOTE — Progress Notes (Signed)
3/12/20213:34 PM  Sandra Moody 1970-10-27, 49 y.o., female [id number]  Chief Complaint  Patient presents with  . Back Pain    4 days  . Diabetes    HPI:   Patient is a 49 y.o. female with past medical history significant for DM2,HTN, migraines, asthma, GERD, biliary dyskinesia, who presents today for routine followup  Last OV a month ago - education re diet as patient does not want to take meds for dm due to recurring hypoglycemia  She has been working on her diet and exercise cbgs had been doing well until recently when given prednisone by podiatrist She was also started on lyrica Glucometer reading:  7 day avg 207 14 day avg 177 30 day avg 162  Wants to have urine checked for UTI Having low back pain for past 4-5 days and she hold her urine a lot at work  She denies any dysuria or hematuria  Lab Results  Component Value Date   HGBA1C 6.1 (A) 08/07/2019   HGBA1C 6.7 (A) 02/20/2019   HGBA1C 10.3 (H) 10/01/2018   Lab Results  Component Value Date   LDLCALC 72 08/07/2019   CREATININE 1.06 (H) 08/07/2019    Depression screen PHQ 2/9 10/10/2019 09/12/2019 05/23/2019  Decreased Interest 0 0 0  Down, Depressed, Hopeless 0 0 0  PHQ - 2 Score 0 0 0    Fall Risk  10/10/2019 09/12/2019 05/23/2019 02/20/2019 01/28/2019  Falls in the past year? 0 0 0 0 0  Number falls in past yr: 0 0 0 0 -  Injury with Fall? 0 0 0 0 -  Follow up Falls evaluation completed Falls evaluation completed - Falls evaluation completed -     Allergies  Allergen Reactions  . Ginger Itching and Swelling  . Passion Fruit Flavor [Flavoring Agent] Itching and Swelling  . Peanuts [Peanut Oil] Anaphylaxis    PT ALLERGIC TO ALL TREE NUTS  . Shellfish Allergy Anaphylaxis  . Tamiflu [Oseltamivir Phosphate]     Prior to Admission medications   Medication Sig Start Date End Date Taking? Authorizing Provider  ACCU-CHEK GUIDE test strip USE TO CHECK GLUCOSE TWICE DAILY,FASTING AND 2 HOURS AFTER  DINNER 07/15/19  Yes Rutherford Guys, MD  ALBUTEROL SULFATE PO Take by mouth.   Yes [provider]  amLODipine (NORVASC) 2.5 MG tablet TAKE 1 TABLET BY MOUTH EVERY DAY 07/15/19  Yes Rutherford Guys, MD  blood glucose meter kit and supplies Per insurance preference. Use to check glucose twice a day (fasting and 2 hours after dinner)  Dx E11.65 10/30/18  Yes Rutherford Guys, MD  EPIPEN 2-PAK 0.3 MG/0.3ML SOAJ injection Inject 0.3 mg into the muscle as needed (for allergic reaction).  05/18/16  Yes [provider]  fluticasone (FLONASE) 50 MCG/ACT nasal spray USE 1 2 SPRAYS IN EACH NOSTRIL ONCE A DAY 10/25/18  Yes [provider]  ibuprofen (ADVIL) 800 MG tablet Take 800 mg by mouth 4 (four) times daily as needed. 05/16/19  Yes [provider]  imipramine (TOFRANIL-PM) 100 MG capsule Take 1 capsule (100 mg total) by mouth at bedtime. 04/30/19  Yes Kathrynn Ducking, MD  ketoconazole (NIZORAL) 2 % shampoo Apply 1 application topically 2 (two) times a week. Apply to affected area lather, leave in place for 5 mins and then rinse off with water 05/05/19  Yes Rutherford Guys, MD  levocetirizine (XYZAL) 5 MG tablet 1 2 TABLET IN THE EVENING ONCE A DAY ORALLY 15 11/05/18  Yes [provider]  medroxyPROGESTERone (DEPO-PROVERA) 150 MG/ML injection  08/15/18  Yes [provider]  montelukast (SINGULAIR) 10 MG tablet Take 10 mg by mouth at bedtime.   Yes [provider]  olopatadine (PATANOL) 0.1 % ophthalmic solution Place 1 drop into both eyes 2 (two) times daily. 12/25/18  Yes Corum, Rex Kras, MD  pregabalin (LYRICA) 100 MG capsule Take 100 mg by mouth 3 (three) times daily. 10/09/19  Yes [provider]  RAYOS 5 MG TBEC  09/26/19  Yes [provider]  rizatriptan (MAXALT) 10 MG tablet TAKE 1 TABLET (10 MG TOTAL) BY MOUTH 3 (THREE) TIMES DAILY AS NEEDED FOR MIGRAINE. 06/02/19  Yes Kathrynn Ducking, MD  SYMBICORT 160-4.5 MCG/ACT inhaler  INHALE 2 PUFFS INTO THE LUNGS DAILY. 05/09/18  Yes Valentina Shaggy, MD    Past Medical History:  Diagnosis Date  . Acute renal disease   . Asthma   . Bilateral carpal tunnel syndrome 03/11/2019  . Common migraine with intractable migraine 08/29/2018  . Daily headache    (03/08/2017)  . Essential hypertension 02/20/2019  . GERD (gastroesophageal reflux disease)   . H pylori ulcer ~ 08/2016  . History of hiatal hernia   . Leaky heart valve ~ 09/2016  . Migraine    "maybe weekly" (03/08/2017)  . PONV (postoperative nausea and vomiting)   . Type 2 diabetes mellitus with hyperglycemia, without long-term current use of insulin ([place]) 02/20/2019    Past Surgical History:  Procedure Laterality Date  . CHOLECYSTECTOMY N/A 03/08/2017   Procedure: LAPAROSCOPIC CHOLECYSTECTOMY;  Surgeon: Coralie Keens, MD;  Location: [facility];  Service: General;  Laterality: N/A;  . DILATION AND CURETTAGE OF UTERUS    . LAPAROSCOPIC CHOLECYSTECTOMY  03/08/2017  . TUBAL LIGATION      Social History   Tobacco Use  . Smoking status: Never Smoker  . Smokeless tobacco: Never Used  Substance Use Topics  . Alcohol use: Yes    Comment: 03/08/2017 "might have a drink on a holiday"    Family History  Problem Relation Age of Onset  . Thyroid disease Mother   . Diabetes Maternal Grandmother   . Heart disease Maternal Grandfather     ROS Per hpi  OBJECTIVE:  Today's Vitals   10/10/19 1532  BP: 122/80  Pulse: (!) 102  Resp: 18  Temp: 98.1 F (36.7 C)  SpO2: 99%  Weight: 155 lb (70.3 kg)  Height: _0  (1.626 m)   Body mass index is 26.61 kg/m.  Wt Readings from Last 3 Encounters:  10/10/19 155 lb (70.3 kg)  09/12/19 158 lb 3.2 oz (71.8 kg)  09/05/19 156 lb 12.8 oz (71.1 kg)    Physical Exam Vitals and nursing note reviewed.  Constitutional:      Appearance: She is well-developed.  HENT:     Head: Normocephalic and atraumatic.  Eyes:     General: No scleral icterus.     Conjunctiva/sclera: Conjunctivae normal.     Pupils: Pupils are equal, round, and reactive to light.  Pulmonary:     Effort: Pulmonary effort is normal.  Musculoskeletal:     Cervical back: Neck supple.  Skin:    General: Skin is warm and dry.  Neurological:     Mental Status: She is alert and oriented to person, place, and time.     Results for orders placed or performed in visit on 10/10/19 (from the past 24 hour(s))  POCT urinalysis dipstick     Status:  Abnormal   Collection Time: 10/10/19  3:51 PM  Result Value Ref Range   Color, UA yellow yellow   Clarity, UA clear clear   Glucose, UA negative negative mg/dL   Bilirubin, UA negative negative   Ketones, POC UA trace (5) (A) negative mg/dL   Spec Grav, UA >=1.030 (A) 1.010 - 1.025   Blood, UA negative negative   pH, UA 6.0 5.0 - 8.0   Protein Ur, POC =30 (A) negative mg/dL   Urobilinogen, UA 0.2 0.2 or 1.0 E.U./dL   Nitrite, UA Negative Negative   Leukocytes, UA Negative Negative    No results found.   ASSESSMENT and PLAN  1. Type 2 diabetes mellitus with hyperglycemia, without long-term current use of insulin (HCC) Prior to prednisone cbgs were acceptable. Cont to monitor and work on diet/exercise. RTC precautions given.  - Microalbumin / creatinine urine ratio  2. Bilateral low back pain without sciatica, unspecified chronicity - POCT urinalysis dipstick - neg infection Conservative treatment  Other orders - Tdap vaccine greater than or equal to 7yo IM  Return in about 3 months (around 01/10/2020).    Rutherford Guys, MD [facility], [place] [address] Ph.  [phone] Fax [phone]

## 2019-10-11 LAB — MICROALBUMIN / CREATININE URINE RATIO
Creatinine, Urine: 303.3 mg/dL
Microalb/Creat Ratio: 11 mg/g creat (ref 0–29)
Microalbumin, Urine: 32.4 ug/mL

## 2019-10-13 ENCOUNTER — Telehealth: Payer: Self-pay | Admitting: Family Medicine

## 2019-10-13 NOTE — Telephone Encounter (Addendum)
10/13/2019 - PATIENT WAS IN THE OFFICE TO SEE DR. IRMA ON Friday (10/10/2019) FOR HER MEDICAL CONDITIONS. DR. Darcel Bayley HAS REQUESTED SHE RETURN FOR FOLLOW-UP IN 3 MONTHS (June 2021). I DID NOT SEE THIS PATIENT COME TO CHECK-OUT. Monday (10/13/2019) I TIRED TO CALL AND SCHEDULE BUT HER VOICE MAIL BOX HAS NOT BEEN SET UP YET. MBC

## 2019-10-21 ENCOUNTER — Encounter: Payer: Self-pay | Admitting: Neurology

## 2019-10-21 ENCOUNTER — Other Ambulatory Visit: Payer: Self-pay

## 2019-10-21 ENCOUNTER — Ambulatory Visit (INDEPENDENT_AMBULATORY_CARE_PROVIDER_SITE_OTHER): Payer: Managed Care, Other (non HMO) | Admitting: Neurology

## 2019-10-21 VITALS — BP 124/78 | HR 99 | Temp 97.2°F | Ht 64.0 in | Wt 156.0 lb

## 2019-10-21 DIAGNOSIS — G43019 Migraine without aura, intractable, without status migrainosus: Secondary | ICD-10-CM | POA: Diagnosis not present

## 2019-10-21 MED ORDER — EMGALITY 120 MG/ML ~~LOC~~ SOAJ: 120.0000 mg | SUBCUTANEOUS | 11 refills | Status: DC

## 2019-10-21 MED ORDER — EMGALITY 120 MG/ML ~~LOC~~ SOAJ: 240.0000 mg | SUBCUTANEOUS | 0 refills | Status: DC

## 2019-10-21 NOTE — Progress Notes (Signed)
PATIENT: Sandra Moody DOB: 11-Feb-1971  REASON FOR VISIT: follow up HISTORY FROM: patient  HISTORY OF PRESENT ILLNESS: Today 10/21/19  Sandra Moody is a 49 year old female with history of migraine headache.  Since last seen, she stopped taking Tofranil, was afraid it could be affecting her diabetes, was tired of taking multiple medications.  Since off the medicine, has had at least 2 migraines a week, with nausea.  Will take Maxalt with good benefit.  Her diabetes is now under control.  She is seeing a foot doctor, is prescribed Lyrica.  She says her headaches worsen during allergy season.  She has asthma.  She says she has previously been on Topamax.  She has not missing work, but says she has to go to work, she works as a Advertising account executive.  She has seen Dr. Fredna Dow, for thumb issues, says is doing well.  She presents today for evaluation unaccompanied.   HISTORY 04/30/2019 Dr. Jannifer Franklin: Sandra Moody is a 49 year old right-handed black female with a history of migraine headache.  The patient claims that she has about 4-5 headache days a month currently, she is on imipramine taking 100 mg at night.  Her heart rate on this medication consistently is over 100.  She takes Maxalt if needed, she believes this medication is helpful.  She recently has developed swelling and heat involving the left thumb, she was placed on antibiotics for this, the swelling has improved but the pain remains severe.  She will be seeing Dr. Fredna Dow today.  The patient does have bilateral carpal tunnel syndrome by nerve conduction studies.  The patient denies any significant neck pain, she does have some occasional low back pain, she still feels somewhat weak in the left leg even though EMG study done previously was unremarkable.  She has been to the emergency room on 3 occasions, on the 16th, 14th, and 28 September because of her left thumb issue.  She returns today for an evaluation.  REVIEW OF SYSTEMS: Out of a complete 14 system  review of symptoms, the patient complains only of the following symptoms, and all other reviewed systems are negative.  headache  ALLERGIES: Allergies  Allergen Reactions  . Ginger Itching and Swelling  . Passion Fruit Flavor [Flavoring Agent] Itching and Swelling  . Peanuts [Peanut Oil] Anaphylaxis    PT ALLERGIC TO ALL TREE NUTS  . Shellfish Allergy Anaphylaxis  . Tamiflu [Oseltamivir Phosphate]     HOME MEDICATIONS: Outpatient Medications Prior to Visit  Medication Sig Dispense Refill  . ACCU-CHEK GUIDE test strip USE TO CHECK GLUCOSE TWICE DAILY,FASTING AND 2 HOURS AFTER DINNER 100 strip 5  . ALBUTEROL SULFATE PO Take by mouth.    Marland Kitchen amLODipine (NORVASC) 2.5 MG tablet TAKE 1 TABLET BY MOUTH EVERY DAY 30 tablet 2  . blood glucose meter kit and supplies Per insurance preference. Use to check glucose twice a day (fasting and 2 hours after dinner)  Dx E11.65 1 each 5  . EPIPEN 2-PAK 0.3 MG/0.3ML SOAJ injection Inject 0.3 mg into the muscle as needed (for allergic reaction).   2  . fluticasone (FLONASE) 50 MCG/ACT nasal spray USE 1 2 SPRAYS IN EACH NOSTRIL ONCE A DAY    . ibuprofen (ADVIL) 800 MG tablet Take 800 mg by mouth 4 (four) times daily as needed.    Marland Kitchen ketoconazole (NIZORAL) 2 % shampoo Apply 1 application topically 2 (two) times a week. Apply to affected area lather, leave in place for 5 mins and then  rinse off with water 120 mL 5  . levocetirizine (XYZAL) 5 MG tablet 1 2 TABLET IN THE EVENING ONCE A DAY ORALLY 15    . medroxyPROGESTERone (DEPO-PROVERA) 150 MG/ML injection     . montelukast (SINGULAIR) 10 MG tablet Take 10 mg by mouth at bedtime.    Marland Kitchen olopatadine (PATANOL) 0.1 % ophthalmic solution Place 1 drop into both eyes 2 (two) times daily. 5 mL 12  . pregabalin (LYRICA) 100 MG capsule Take 100 mg by mouth 3 (three) times daily.    . rizatriptan (MAXALT) 10 MG tablet TAKE 1 TABLET (10 MG TOTAL) BY MOUTH 3 (THREE) TIMES DAILY AS NEEDED FOR MIGRAINE. 6 tablet 6  .  SYMBICORT 160-4.5 MCG/ACT inhaler INHALE 2 PUFFS INTO THE LUNGS DAILY. 10.2 Inhaler 0  . RAYOS 5 MG TBEC     . imipramine (TOFRANIL-PM) 100 MG capsule Take 1 capsule (100 mg total) by mouth at bedtime. (Patient not taking: Reported on 10/21/2019) 90 capsule 3   No facility-administered medications prior to visit.    PAST MEDICAL HISTORY: Past Medical History:  Diagnosis Date  . Acute renal disease   . Asthma   . Bilateral carpal tunnel syndrome 03/11/2019  . Common migraine with intractable migraine 08/29/2018  . Daily headache    (03/08/2017)  . Essential hypertension 02/20/2019  . GERD (gastroesophageal reflux disease)   . H pylori ulcer ~ 08/2016  . History of hiatal hernia   . Leaky heart valve ~ 09/2016  . Migraine    "maybe weekly" (03/08/2017)  . PONV (postoperative nausea and vomiting)   . Type 2 diabetes mellitus with hyperglycemia, without long-term current use of insulin ([place]) 02/20/2019    PAST SURGICAL HISTORY: Past Surgical History:  Procedure Laterality Date  . CHOLECYSTECTOMY N/A 03/08/2017   Procedure: LAPAROSCOPIC CHOLECYSTECTOMY;  Surgeon: Coralie Keens, MD;  Location: [facility];  Service: General;  Laterality: N/A;  . DILATION AND CURETTAGE OF UTERUS    . LAPAROSCOPIC CHOLECYSTECTOMY  03/08/2017  . TUBAL LIGATION      FAMILY HISTORY: Family History  Problem Relation Age of Onset  . Thyroid disease Mother   . Diabetes Maternal Grandmother   . Heart disease Maternal Grandfather     SOCIAL HISTORY: Social History   Socioeconomic History  . Marital status: Significant Other    Spouse name: Not on file  . Number of children: 3  . Years of education: Not on file  . Highest education level: 12th grade  Occupational History    Comment: [employer]   Tobacco Use  . Smoking status: Never Smoker  . Smokeless tobacco: Never Used  Substance and Sexual Activity  . Alcohol use: Yes    Comment: 03/08/2017 "might have a drink on a holiday"  . Drug use: No  .  Sexual activity: Yes    Partners: Male    Birth control/protection: Injection  Other Topics Concern  . Not on file  Social History Narrative   ** Merged History Encounter **   Right handed    Lives at home with significant other 2 cups daily of caffeine    Social Determinants of Health   Financial Resource Strain:   . Difficulty of Paying Living Expenses:   Food Insecurity:   . Worried About Charity fundraiser in the Last Year:   . Arboriculturist in the Last Year:   Transportation Needs:   . Film/video editor (Medical):   Marland Kitchen Lack of Transportation (Non-Medical):  Physical Activity:   . Days of Exercise per Week:   . Minutes of Exercise per Session:   Stress:   . Feeling of Stress :   Social Connections:   . Frequency of Communication with Friends and Family:   . Frequency of Social Gatherings with Friends and Family:   . Attends Religious Services:   . Active Member of Clubs or Organizations:   . Attends Archivist Meetings:   Marland Kitchen Marital Status:   Intimate Partner Violence:   . Fear of Current or Ex-Partner:   . Emotionally Abused:   Marland Kitchen Physically Abused:   . Sexually Abused:    PHYSICAL EXAM  Vitals:   10/21/19 1335  BP: 124/78  Pulse: 99  Temp: (!) 97.2 F (36.2 C)  Weight: 156 lb (70.8 kg)  Height: 5' 4"  (1.626 m)   Body mass index is 26.78 kg/m.  Generalized: Well developed, in no acute distress   Neurological examination  Mentation: Alert oriented to time, place, history taking. Follows all commands speech and language fluent Cranial nerve II-XII: Pupils were equal round reactive to light. Extraocular movements were full, visual field were full on confrontational test. Facial sensation and strength were normal. Head turning and shoulder shrug  were normal and symmetric. Motor: The motor testing reveals 5 over 5 strength of all 4 extremities. Good symmetric motor tone is noted throughout.  Sensory: Sensory testing is intact to soft touch on  all 4 extremities. No evidence of extinction is noted.  Coordination: Cerebellar testing reveals good finger-nose-finger and heel-to-shin bilaterally.  Gait and station: Gait is normal. Tandem gait is normal.  Reflexes: Deep tendon reflexes are symmetric and normal bilaterally.   DIAGNOSTIC DATA (LABS, IMAGING, TESTING) - I reviewed patient records, labs, notes, testing and imaging myself where available.  Lab Results  Component Value Date   WBC 6.7 07/31/2019   HGB 13.0 07/31/2019   HCT 40.5 07/31/2019   MCV 96.9 07/31/2019   PLT 357 07/31/2019      Component Value Date/Time   NA 140 08/07/2019 0851   K 3.7 08/07/2019 0851   CL 103 08/07/2019 0851   CO2 23 08/07/2019 0851   GLUCOSE 88 08/07/2019 0851   GLUCOSE 121 (H) 07/31/2019 0328   BUN 10 08/07/2019 0851   CREATININE 1.06 (H) 08/07/2019 0851   CALCIUM 9.7 08/07/2019 0851   PROT 6.9 07/31/2019 0328   PROT 6.9 05/23/2019 0919   ALBUMIN 3.7 07/31/2019 0328   ALBUMIN 4.2 05/23/2019 0919   AST 20 07/31/2019 0328   ALT 18 07/31/2019 0328   ALKPHOS 62 07/31/2019 0328   BILITOT 0.6 07/31/2019 0328   BILITOT 0.3 05/23/2019 0919   GFRNONAA 63 08/07/2019 0851   GFRAA 73 08/07/2019 0851   Lab Results  Component Value Date   CHOL 124 08/07/2019   HDL 35 (L) 08/07/2019   LDLCALC 72 08/07/2019   TRIG 86 08/07/2019   CHOLHDL 3.5 08/07/2019   Lab Results  Component Value Date   HGBA1C 6.1 (A) 08/07/2019   Lab Results  Component Value Date   [id number] 702 05/23/2019   Lab Results  Component Value Date   TSH 1.590 05/23/2019      ASSESSMENT AND PLAN 49 y.o. year old female  has a past medical history of Acute renal disease, Asthma, Bilateral carpal tunnel syndrome (03/11/2019), Common migraine with intractable migraine (08/29/2018), Daily headache, Essential hypertension (02/20/2019), GERD (gastroesophageal reflux disease), H pylori ulcer (~ 08/2016), History of hiatal hernia, Leaky heart  valve (~ 09/2016), Migraine, PONV  (postoperative nausea and vomiting), and Type 2 diabetes mellitus with hyperglycemia, without long-term current use of insulin ([place]) (02/20/2019). here with:  1.  Migraine headache 2.  Diabetes 3.  Bilateral carpal tunnel syndrome  Since last seen, she has taken herself off the Tofranil, due to concern interfering with her diabetes.  She is reporting at least 2 migraines a week.  She has previously tried Topamax.  I would not offer propanolol due to history of asthma.  I will start Emgality for migraine prevention, initial dose 240 mg, followed by 120 mg monthly injection.  She will remain on Maxalt as needed.  I will see her back in the office in 4 months to determine benefit of Emgality. She reports she has an allergy to latex gloves.  I spent 20 minutes of face-to-face and non-face-to-face time with patient.  This included previsit chart review, lab review, study review, order entry, electronic health record documentation, patient education.   Butler Denmark, AGNP-C, DNP 10/21/2019, 2:20 PM [facility] [address], [place], [place] [address] [phone]

## 2019-10-21 NOTE — Patient Instructions (Signed)
We will start you on Emgality once monthly for migraine prevention  The initial dose is 240 mg the first month, subsequent doses are 120 mg  Continue the Maxalt as needed, keep off the Tofranil

## 2019-10-22 ENCOUNTER — Telehealth: Payer: Self-pay | Admitting: *Deleted

## 2019-10-22 NOTE — Telephone Encounter (Signed)
mgality PA, key: [id number], [id number]. failed topamax, can't take Aimovig- latex allergy, can't take propranolol due to asthma, stopped imipramine due to diabetes.  Your information has been submitted to [employer]. [employer] is reviewing the PA request and you will receive an electronic response. You may check for the updated outcome later by reopening this request. The standard fax determination will also be sent to you directly. If you have any questions about your PA submission, contact [employer] at [phone]

## 2019-10-23 ENCOUNTER — Other Ambulatory Visit: Payer: Self-pay | Admitting: Neurology

## 2019-10-23 ENCOUNTER — Encounter: Payer: Self-pay | Admitting: *Deleted

## 2019-10-23 NOTE — Progress Notes (Signed)
I have read the note, and I agree with the clinical assessment and plan.  Topaz Raglin K Tayonna Bacha   

## 2019-10-23 NOTE — Telephone Encounter (Signed)
Emgality approved 10/21/2019 through 04/22/2020. Notified patient via my chart.

## 2019-11-02 ENCOUNTER — Other Ambulatory Visit: Payer: Self-pay | Admitting: Family Medicine

## 2019-11-02 NOTE — Telephone Encounter (Signed)
Requested Prescriptions  Pending Prescriptions Disp Refills  . amLODipine (NORVASC) 2.5 MG tablet [Pharmacy Med Name: AMLODIPINE BESYLATE 2.5 MG TAB] 90 tablet 1    Sig: TAKE 1 TABLET BY MOUTH EVERY DAY     Cardiovascular:  Calcium Channel Blockers Passed - 11/02/2019 12:10 AM      Passed - Last BP in normal range    BP Readings from Last 1 Encounters:  10/21/19 124/78         Passed - Valid encounter within last 6 months    Recent Outpatient Visits          3 weeks ago Type 2 diabetes mellitus with hyperglycemia, without long-term current use of insulin (HCC)   [facility], Meda Coffee, MD   1 month ago Type 2 diabetes mellitus with hyperglycemia, without long-term current use of insulin [facility])   [facility], Meda Coffee, MD   2 months ago Type 2 diabetes mellitus with hypoglycemia without coma, without long-term current use of insulin [facility])   [facility], Meda Coffee, MD   5 months ago DOE (dyspnea on exertion)   [facility], Meda Coffee, MD   6 months ago Fatigue, unspecified type   [facility], Meda Coffee, MD

## 2019-11-13 ENCOUNTER — Other Ambulatory Visit: Payer: Self-pay

## 2019-11-13 ENCOUNTER — Encounter: Payer: Self-pay | Admitting: Family Medicine

## 2019-11-13 ENCOUNTER — Telehealth (INDEPENDENT_AMBULATORY_CARE_PROVIDER_SITE_OTHER): Payer: Managed Care, Other (non HMO) | Admitting: Family Medicine

## 2019-11-13 VITALS — Ht 64.0 in | Wt 159.0 lb

## 2019-11-13 DIAGNOSIS — H1045 Other chronic allergic conjunctivitis: Secondary | ICD-10-CM | POA: Diagnosis not present

## 2019-11-13 DIAGNOSIS — J4540 Moderate persistent asthma, uncomplicated: Secondary | ICD-10-CM | POA: Diagnosis not present

## 2019-11-13 MED ORDER — ALBUTEROL SULFATE (2.5 MG/3ML) 0.083% IN NEBU: 2.5000 mg | INHALATION_SOLUTION | Freq: Four times a day (QID) | RESPIRATORY_TRACT | 1 refills | Status: AC | PRN

## 2019-11-13 NOTE — Patient Instructions (Addendum)
   Good talking to you today.  I will write for the albuterol Nebules, but as we discussed I would recommend use of dust mask when mowing, albuterol inhaler may be initial sufficient treatment if flare in asthma symptoms.  Continue other routine asthma and allergy medications.  Follow-up with pulmonary or allergist if more frequent need for albuterol or worsening control.  Let us know if there are questions and take care.   If you have lab work done today you will be contacted with your lab results within the next 2 weeks.  If you have not heard from [place] then please contact us. The fastest way to get your results is to register for My Chart.   IF you received an x-ray today, you will receive an invoice from [facility]. Please contact [facility] at [phone] with questions or concerns regarding your invoice.   IF you received labwork today, you will receive an invoice from [facility]. Please contact [facility] at [phone] with questions or concerns regarding your invoice.   Our billing staff will not be able to assist you with questions regarding bills from these companies.  You will be contacted with the lab results as soon as they are available. The fastest way to get your results is to activate your My Chart account. Instructions are located on the last page of this paperwork. If you have not heard from [place] regarding the results in 2 weeks, please contact this office.

## 2019-11-13 NOTE — Progress Notes (Signed)
Virtual Visit via Video Note  I connected with Sandra Moody on 11/13/19 at 6:30 PM by a video enabled telemedicine application and verified that I am speaking with the correct person using two identifiers.   I discussed the limitations, risks, security and privacy concerns of performing an evaluation and management service by telephone and the availability of in person appointments. I also discussed with the patient that there may be a patient responsible charge related to this service. The patient expressed understanding and agreed to proceed, consent obtained  Chief complaint:  Chief Complaint  Patient presents with  . Medication Refill    on pt's athman nebulizer solution. pt is having issues with her asthma now that she is cutting her grass again. pt has SOB. pt states it feels tight when she tries to take a deep breath.     History of Present Illness: Sandra Moody is a 49 y.o. female   Sandra Moody presents for   Asthma: Moderate persistent by history.  Previously treated with Symbicort 160/4.5 mcg 2 puffs twice daily, Xyzal, Singulair 10 mg, Patanol for allergic conjunctivitis, Flonase, and albuterol as needed. Office visit January 26 with [facility] reviewed, PFTs normal, symptoms well controlled at that time, daily use of Flonase recommended.  Dr. Carlis Abbott..  Allergist Dr. Donneta Romberg. Saw Dr. Donneta Romberg recently. Asked about nebulizer, but recommend she discuss neb with pulmonary. Allergist discussed change in inhaler, but she did not want to do so as only needs nebulizer after cutting grass. Otherwise asthma has been doing well.  Albuterol rescue inhaler - uses when stressed or increased work around the house. 3 times in past 2 weeks -- only when cutting grass - 2 puffs before cutting grass. Still tight in lungs, dyspnea after mowing grass.  Not using mask with cutting grass every time.  Had to be seen in ER last year.   Cuts her own grass at home - has been      Patient Active Problem List   Diagnosis Date Noted  . Type 2 diabetes mellitus without complication, without long-term current use of insulin ([place]) 09/05/2019  . Trigger finger of left thumb 04/30/2019  . Infection of flexor tendon sheath 04/15/2019  . Carpal tunnel syndrome, bilateral 03/11/2019  . Essential hypertension 02/20/2019  . Type 2 diabetes mellitus with hyperglycemia, without long-term current use of insulin ([place]) 02/20/2019  . Other acute sinusitis 12/25/2018  . Irritable bowel syndrome 12/04/2018  . Migraine with aura 12/04/2018  . Common migraine with intractable migraine 08/29/2018  . Moderate persistent asthma without complication 16/38/4665  . Seasonal and perennial allergic rhinitis 06/28/2017  . Mild persistent asthma with acute exacerbation 06/28/2017  . Gastroesophageal reflux disease 06/28/2017  . Biliary dyskinesia 03/08/2017  . Asthma 03/02/2016  . Asthma with acute exacerbation 10/04/2015  . Other and unspecified ovarian cyst 06/17/2012   Past Medical History:  Diagnosis Date  . Acute renal disease   . Asthma   . Bilateral carpal tunnel syndrome 03/11/2019  . Common migraine with intractable migraine 08/29/2018  . Daily headache    (03/08/2017)  . Essential hypertension 02/20/2019  . GERD (gastroesophageal reflux disease)   . H pylori ulcer ~ 08/2016  . History of hiatal hernia   . Leaky heart valve ~ 09/2016  . Migraine    "maybe weekly" (03/08/2017)  . PONV (postoperative nausea and vomiting)   . Type 2 diabetes mellitus with hyperglycemia, without long-term current use of insulin ([place]) 02/20/2019   Past  Surgical History:  Procedure Laterality Date  . CHOLECYSTECTOMY N/A 03/08/2017   Procedure: LAPAROSCOPIC CHOLECYSTECTOMY;  Surgeon: Coralie Keens, MD;  Location: [facility];  Service: General;  Laterality: N/A;  . DILATION AND CURETTAGE OF UTERUS    . LAPAROSCOPIC CHOLECYSTECTOMY  03/08/2017  . TUBAL LIGATION     Allergies  Allergen Reactions  .  Ginger Itching and Swelling  . Passion Fruit Flavor [Flavoring Agent] Itching and Swelling  . Peanuts [Peanut Oil] Anaphylaxis    PT ALLERGIC TO ALL TREE NUTS  . Shellfish Allergy Anaphylaxis  . Tamiflu [Oseltamivir Phosphate]    Prior to Admission medications   Medication Sig Start Date End Date Taking? Authorizing Provider  ACCU-CHEK GUIDE test strip USE TO CHECK GLUCOSE TWICE DAILY,FASTING AND 2 HOURS AFTER DINNER 07/15/19  Yes Rutherford Guys, MD  ALBUTEROL SULFATE PO Take by mouth.   Yes [provider]  amLODipine (NORVASC) 2.5 MG tablet TAKE 1 TABLET BY MOUTH EVERY DAY 11/02/19  Yes Rutherford Guys, MD  blood glucose meter kit and supplies Per insurance preference. Use to check glucose twice a day (fasting and 2 hours after dinner)  Dx E11.65 10/30/18  Yes Rutherford Guys, MD  EPIPEN 2-PAK 0.3 MG/0.3ML SOAJ injection Inject 0.3 mg into the muscle as needed (for allergic reaction).  05/18/16  Yes [provider]  fluticasone (FLONASE) 50 MCG/ACT nasal spray USE 1 2 SPRAYS IN EACH NOSTRIL ONCE A DAY 10/25/18  Yes [provider]  Galcanezumab-gnlm (EMGALITY) 120 MG/ML SOAJ Inject 240 mg into the skin every 30 (thirty) days. 10/21/19  Yes Suzzanne Cloud, NP  Galcanezumab-gnlm (EMGALITY) 120 MG/ML SOAJ Inject 120 mg into the skin every 30 (thirty) days. 10/21/19  Yes Suzzanne Cloud, NP  ibuprofen (ADVIL) 800 MG tablet Take 800 mg by mouth 4 (four) times daily as needed. 05/16/19  Yes [provider]  ketoconazole (NIZORAL) 2 % shampoo Apply 1 application topically 2 (two) times a week. Apply to affected area lather, leave in place for 5 mins and then rinse off with water 05/05/19  Yes Rutherford Guys, MD  levocetirizine (XYZAL) 5 MG tablet 1 2 TABLET IN THE EVENING ONCE A DAY ORALLY 15 11/05/18  Yes [provider]  medroxyPROGESTERone (DEPO-PROVERA) 150 MG/ML injection  08/15/18  Yes [provider]  montelukast (SINGULAIR) 10 MG tablet Take 10  mg by mouth at bedtime.   Yes [provider]  olopatadine (PATANOL) 0.1 % ophthalmic solution Place 1 drop into both eyes 2 (two) times daily. 12/25/18  Yes Corum, Rex Kras, MD  pregabalin (LYRICA) 100 MG capsule Take 100 mg by mouth 3 (three) times daily. 10/09/19  Yes [provider]  rizatriptan (MAXALT) 10 MG tablet TAKE 1 TABLET (10 MG TOTAL) BY MOUTH 3 (THREE) TIMES DAILY AS NEEDED FOR MIGRAINE. 06/02/19  Yes Kathrynn Ducking, MD  SYMBICORT 160-4.5 MCG/ACT inhaler INHALE 2 PUFFS INTO THE LUNGS DAILY. 05/09/18  Yes Valentina Shaggy, MD   Social History   Socioeconomic History  . Marital status: Significant Other    Spouse name: Not on file  . Number of children: 3  . Years of education: Not on file  . Highest education level: 12th grade  Occupational History    Comment: [employer]   Tobacco Use  . Smoking status: Never Smoker  . Smokeless tobacco: Never Used  Substance and Sexual Activity  . Alcohol use: Yes    Comment: 03/08/2017 "might have a drink on  a holiday"  . Drug use: No  . Sexual activity: Yes    Partners: Male    Birth control/protection: Injection  Other Topics Concern  . Not on file  Social History Narrative   ** Merged History Encounter **   Right handed    Lives at home with significant other 2 cups daily of caffeine    Social Determinants of Health   Financial Resource Strain:   . Difficulty of Paying Living Expenses:   Food Insecurity:   . Worried About Charity fundraiser in the Last Year:   . Arboriculturist in the Last Year:   Transportation Needs:   . Film/video editor (Medical):   Marland Kitchen Lack of Transportation (Non-Medical):   Physical Activity:   . Days of Exercise per Week:   . Minutes of Exercise per Session:   Stress:   . Feeling of Stress :   Social Connections:   . Frequency of Communication with Friends and Family:   . Frequency of Social Gatherings with Friends and Family:   . Attends Religious Services:   .  Active Member of Clubs or Organizations:   . Attends Archivist Meetings:   Marland Kitchen Marital Status:   Intimate Partner Violence:   . Fear of Current or Ex-Partner:   . Emotionally Abused:   Marland Kitchen Physically Abused:   . Sexually Abused:     Observations/Objective: Vitals:   11/13/19 1221  Weight: 159 lb (72.1 kg)  Height: 5' 4"  (1.626 m)   Nontoxic appearance on video, no respiratory distress, appropriate responses.  All questions answered with understanding of plan expressed   Assessment and Plan: Moderate persistent asthma without complication - Plan: albuterol (PROVENTIL) (2.5 MG/3ML) 0.083% nebulizer solution  Other chronic allergic conjunctivitis, unspecified laterality  Previously asthma was controlled, and reports overall stable symptoms except flare in symptoms with mowing grass.  Stressed importance of trigger avoidance with use of dust mask.  Albuterol inhaler would be first choice to use if flare symptoms even with use of dust mask, but option of albuterol Nebules for more significant symptoms.  Follow-up as planned with pulmonologist.  Continue chronic asthma and allergy medications for now.  RTC/ER precautions.  Follow Up Instructions:  As needed.  I discussed the assessment and treatment plan with the patient. The patient was provided an opportunity to ask questions and all were answered. The patient agreed with the plan and demonstrated an understanding of the instructions.   The patient was advised to call back or seek an in-person evaluation if the symptoms worsen or if the condition fails to improve as anticipated.  I provided 21 minutes of non-face-to-face time during this encounter.   Wendie Agreste, MD

## 2019-11-25 ENCOUNTER — Other Ambulatory Visit: Payer: Self-pay | Admitting: Orthopedic Surgery

## 2019-11-29 LAB — HM DIABETES EYE EXAM

## 2019-12-02 ENCOUNTER — Ambulatory Visit: Payer: Managed Care, Other (non HMO) | Admitting: Internal Medicine

## 2019-12-12 ENCOUNTER — Other Ambulatory Visit: Payer: Self-pay

## 2019-12-12 ENCOUNTER — Encounter (HOSPITAL_BASED_OUTPATIENT_CLINIC_OR_DEPARTMENT_OTHER): Payer: Self-pay | Admitting: Orthopedic Surgery

## 2019-12-15 ENCOUNTER — Encounter (HOSPITAL_BASED_OUTPATIENT_CLINIC_OR_DEPARTMENT_OTHER)
Admission: RE | Admit: 2019-12-15 | Discharge: 2019-12-15 | Disposition: A | Discharge status: Home / Self Care | Payer: Managed Care, Other (non HMO) | Source: Ambulatory Visit | Attending: Orthopedic Surgery | Admitting: Orthopedic Surgery

## 2019-12-15 ENCOUNTER — Other Ambulatory Visit (HOSPITAL_COMMUNITY)
Admission: RE | Admit: 2019-12-15 | Discharge: 2019-12-15 | Disposition: A | Discharge status: Home / Self Care | Payer: Managed Care, Other (non HMO) | Source: Ambulatory Visit | Attending: Orthopedic Surgery | Admitting: Orthopedic Surgery

## 2019-12-15 DIAGNOSIS — Z20822 Contact with and (suspected) exposure to covid-19: Secondary | ICD-10-CM | POA: Insufficient documentation

## 2019-12-15 DIAGNOSIS — Z01812 Encounter for preprocedural laboratory examination: Secondary | ICD-10-CM | POA: Insufficient documentation

## 2019-12-15 LAB — SARS CORONAVIRUS 2 (TAT 6-24 HRS): SARS Coronavirus 2: NEGATIVE

## 2019-12-15 LAB — BASIC METABOLIC PANEL
Anion gap: 10 (ref 5–15)
BUN: 7 mg/dL (ref 6–20)
CO2: 22 mmol/L (ref 22–32)
Calcium: 9 mg/dL (ref 8.9–10.3)
Chloride: 107 mmol/L (ref 98–111)
Creatinine, Ser: 1 mg/dL (ref 0.44–1.00)
GFR calc Af Amer: >60 mL/min (ref >60)
GFR calc non Af Amer: >60 mL/min (ref >60)
Glucose, Bld: 307 mg/dL — ABNORMAL HIGH (ref 70–99)
Potassium: 3.6 mmol/L (ref 3.5–5.1)
Sodium: 139 mmol/L (ref 135–145)

## 2019-12-15 NOTE — Progress Notes (Signed)
Glucose-307, left message for [name] at Dr. Merrilee Seashore office, notified pt, Dr. Salvadore Farber aware, will recheck CBG morning of surgery.

## 2019-12-15 NOTE — Progress Notes (Signed)

## 2019-12-18 ENCOUNTER — Encounter (HOSPITAL_BASED_OUTPATIENT_CLINIC_OR_DEPARTMENT_OTHER)
Admission: RE | Disposition: A | Discharge status: Home / Self Care | Payer: Self-pay | Source: Home / Self Care | Attending: Orthopedic Surgery

## 2019-12-18 ENCOUNTER — Encounter (HOSPITAL_BASED_OUTPATIENT_CLINIC_OR_DEPARTMENT_OTHER): Payer: Self-pay | Admitting: Orthopedic Surgery

## 2019-12-18 ENCOUNTER — Ambulatory Visit (HOSPITAL_BASED_OUTPATIENT_CLINIC_OR_DEPARTMENT_OTHER)
Admission: RE | Admit: 2019-12-18 | Discharge: 2019-12-18 | Disposition: A | Discharge status: Home / Self Care | Payer: Managed Care, Other (non HMO) | Attending: Orthopedic Surgery | Admitting: Orthopedic Surgery

## 2019-12-18 ENCOUNTER — Other Ambulatory Visit: Payer: Self-pay

## 2019-12-18 ENCOUNTER — Ambulatory Visit (HOSPITAL_BASED_OUTPATIENT_CLINIC_OR_DEPARTMENT_OTHER): Payer: Managed Care, Other (non HMO) | Admitting: Anesthesiology

## 2019-12-18 DIAGNOSIS — E119 Type 2 diabetes mellitus without complications: Secondary | ICD-10-CM | POA: Diagnosis not present

## 2019-12-18 DIAGNOSIS — G5602 Carpal tunnel syndrome, left upper limb: Secondary | ICD-10-CM | POA: Insufficient documentation

## 2019-12-18 DIAGNOSIS — Z79899 Other long term (current) drug therapy: Secondary | ICD-10-CM | POA: Diagnosis not present

## 2019-12-18 DIAGNOSIS — I10 Essential (primary) hypertension: Secondary | ICD-10-CM | POA: Diagnosis not present

## 2019-12-18 DIAGNOSIS — K219 Gastro-esophageal reflux disease without esophagitis: Secondary | ICD-10-CM | POA: Diagnosis not present

## 2019-12-18 DIAGNOSIS — M65312 Trigger thumb, left thumb: Secondary | ICD-10-CM | POA: Insufficient documentation

## 2019-12-18 DIAGNOSIS — G43909 Migraine, unspecified, not intractable, without status migrainosus: Secondary | ICD-10-CM | POA: Insufficient documentation

## 2019-12-18 DIAGNOSIS — Z7951 Long term (current) use of inhaled steroids: Secondary | ICD-10-CM | POA: Diagnosis not present

## 2019-12-18 DIAGNOSIS — J45909 Unspecified asthma, uncomplicated: Secondary | ICD-10-CM | POA: Insufficient documentation

## 2019-12-18 HISTORY — PX: CARPAL TUNNEL RELEASE: SHX101

## 2019-12-18 HISTORY — PX: TRIGGER FINGER RELEASE: SHX641

## 2019-12-18 LAB — GLUCOSE, CAPILLARY
Glucose-Capillary: 139 mg/dL — ABNORMAL HIGH (ref 70–99)
Glucose-Capillary: 82 mg/dL (ref 70–99)

## 2019-12-18 LAB — POCT PREGNANCY, URINE: Preg Test, Ur: NEGATIVE

## 2019-12-18 SURGERY — CARPAL TUNNEL RELEASE
Anesthesia: Monitor Anesthesia Care | Site: Thumb | Laterality: Left

## 2019-12-18 MED ORDER — SUCCINYLCHOLINE CHLORIDE 200 MG/10ML IV SOSY
PREFILLED_SYRINGE | INTRAVENOUS | Status: AC
  Filled 2019-12-18: qty 10

## 2019-12-18 MED ORDER — MIDAZOLAM HCL 2 MG/2ML IJ SOLN
INTRAMUSCULAR | Status: AC
  Filled 2019-12-18: qty 2

## 2019-12-18 MED ORDER — CEFAZOLIN SODIUM-DEXTROSE 2-4 GM/100ML-% IV SOLN
INTRAVENOUS | Status: AC
  Filled 2019-12-18: qty 100

## 2019-12-18 MED ORDER — PHENYLEPHRINE 40 MCG/ML (10ML) SYRINGE FOR IV PUSH (FOR BLOOD PRESSURE SUPPORT)
PREFILLED_SYRINGE | INTRAVENOUS | Status: AC
  Filled 2019-12-18: qty 10

## 2019-12-18 MED ORDER — ONDANSETRON HCL 4 MG/2ML IJ SOLN
INTRAMUSCULAR | Status: AC
  Filled 2019-12-18: qty 2

## 2019-12-18 MED ORDER — LACTATED RINGERS IV SOLN: INTRAVENOUS | Status: DC

## 2019-12-18 MED ORDER — DIPHENHYDRAMINE HCL 50 MG/ML IJ SOLN
INTRAMUSCULAR | Status: AC
  Filled 2019-12-18: qty 1

## 2019-12-18 MED ORDER — EPHEDRINE 5 MG/ML INJ
INTRAVENOUS | Status: AC
  Filled 2019-12-18: qty 10

## 2019-12-18 MED ORDER — CEFAZOLIN SODIUM-DEXTROSE 2-4 GM/100ML-% IV SOLN
2.0000 g | INTRAVENOUS | Status: AC
  Administered 2019-12-18: 2 g via INTRAVENOUS

## 2019-12-18 MED ORDER — TRAMADOL HCL 50 MG PO TABS: ORAL_TABLET | ORAL | 0 refills | Status: DC

## 2019-12-18 MED ORDER — ONDANSETRON HCL 4 MG/2ML IJ SOLN
INTRAMUSCULAR | Status: DC | PRN
  Administered 2019-12-18: 4 mg via INTRAVENOUS

## 2019-12-18 MED ORDER — FENTANYL CITRATE (PF) 100 MCG/2ML IJ SOLN
INTRAMUSCULAR | Status: AC
  Filled 2019-12-18: qty 2

## 2019-12-18 MED ORDER — LIDOCAINE HCL (PF) 0.5 % IJ SOLN
INTRAMUSCULAR | Status: DC | PRN
  Administered 2019-12-18: 30 mL via INTRAVENOUS

## 2019-12-18 MED ORDER — MIDAZOLAM HCL 5 MG/5ML IJ SOLN
INTRAMUSCULAR | Status: DC | PRN
  Administered 2019-12-18: 2 mg via INTRAVENOUS

## 2019-12-18 MED ORDER — BUPIVACAINE HCL (PF) 0.25 % IJ SOLN
INTRAMUSCULAR | Status: DC | PRN
  Administered 2019-12-18: 10 mL

## 2019-12-18 MED ORDER — LIDOCAINE 2% (20 MG/ML) 5 ML SYRINGE
INTRAMUSCULAR | Status: AC
  Filled 2019-12-18: qty 5

## 2019-12-18 MED ORDER — PROPOFOL 500 MG/50ML IV EMUL
INTRAVENOUS | Status: DC | PRN
  Administered 2019-12-18: 100 ug/kg/min via INTRAVENOUS

## 2019-12-18 MED ORDER — FENTANYL CITRATE (PF) 100 MCG/2ML IJ SOLN: 25.0000 ug | INTRAMUSCULAR | Status: DC | PRN

## 2019-12-18 MED ORDER — OXYCODONE HCL 5 MG/5ML PO SOLN: 5.0000 mg | Freq: Once | ORAL | Status: DC | PRN

## 2019-12-18 MED ORDER — DIPHENHYDRAMINE HCL 50 MG/ML IJ SOLN
INTRAMUSCULAR | Status: DC | PRN
  Administered 2019-12-18: 6.25 mg via INTRAVENOUS

## 2019-12-18 MED ORDER — FENTANYL CITRATE (PF) 100 MCG/2ML IJ SOLN
INTRAMUSCULAR | Status: DC | PRN
  Administered 2019-12-18 (×2): 50 ug via INTRAVENOUS

## 2019-12-18 MED ORDER — OXYCODONE HCL 5 MG PO TABS: 5.0000 mg | ORAL_TABLET | Freq: Once | ORAL | Status: DC | PRN

## 2019-12-18 MED ORDER — ONDANSETRON HCL 4 MG/2ML IJ SOLN: 4.0000 mg | Freq: Once | INTRAMUSCULAR | Status: DC | PRN

## 2019-12-18 SURGICAL SUPPLY — 37 items
APL PRP STRL LF DISP 70% ISPRP (MISCELLANEOUS) ×2
BLADE SURG 15 STRL LF DISP TIS (BLADE) ×4 IMPLANT
BLADE SURG 15 STRL SS (BLADE) ×6
BNDG CMPR 9X4 STRL LF SNTH (GAUZE/BANDAGES/DRESSINGS) ×2
BNDG COHESIVE 2X5 TAN STRL LF (GAUZE/BANDAGES/DRESSINGS) ×3 IMPLANT
BNDG ELASTIC 3X5.8 VLCR STR LF (GAUZE/BANDAGES/DRESSINGS) ×3 IMPLANT
BNDG ESMARK 4X9 LF (GAUZE/BANDAGES/DRESSINGS) ×1 IMPLANT
BNDG GAUZE ELAST 4 BULKY (GAUZE/BANDAGES/DRESSINGS) ×3 IMPLANT
CHLORAPREP W/TINT 26 (MISCELLANEOUS) ×3 IMPLANT
CORD BIPOLAR FORCEPS 12FT (ELECTRODE) ×3 IMPLANT
COVER BACK TABLE 60X90IN (DRAPES) ×3 IMPLANT
COVER MAYO STAND STRL (DRAPES) ×3 IMPLANT
COVER WAND RF STERILE (DRAPES) IMPLANT
CUFF TOURN SGL QUICK 18X4 (TOURNIQUET CUFF) ×3 IMPLANT
DRAPE EXTREMITY T 121X128X90 (DISPOSABLE) ×3 IMPLANT
DRAPE SURG 17X23 STRL (DRAPES) ×3 IMPLANT
DRSG PAD ABDOMINAL 8X10 ST (GAUZE/BANDAGES/DRESSINGS) ×3 IMPLANT
GAUZE SPONGE 4X4 12PLY STRL (GAUZE/BANDAGES/DRESSINGS) ×3 IMPLANT
GAUZE XEROFORM 1X8 LF (GAUZE/BANDAGES/DRESSINGS) ×3 IMPLANT
GLOVE BIO SURGEON STRL SZ7.5 (GLOVE) ×3 IMPLANT
GLOVE BIOGEL PI IND STRL 8 (GLOVE) ×2 IMPLANT
GLOVE BIOGEL PI INDICATOR 8 (GLOVE) ×1
GOWN STRL REUS W/ TWL LRG LVL3 (GOWN DISPOSABLE) ×2 IMPLANT
GOWN STRL REUS W/TWL LRG LVL3 (GOWN DISPOSABLE) ×6
GOWN STRL REUS W/TWL XL LVL3 (GOWN DISPOSABLE) ×3 IMPLANT
NDL HYPO 25X1 1.5 SAFETY (NEEDLE) ×2 IMPLANT
NEEDLE HYPO 25X1 1.5 SAFETY (NEEDLE) ×3 IMPLANT
NS IRRIG 1000ML POUR BTL (IV SOLUTION) ×3 IMPLANT
PADDING CAST ABS 4INX4YD NS (CAST SUPPLIES) ×1
PADDING CAST ABS COTTON 4X4 ST (CAST SUPPLIES) ×2 IMPLANT
SET BASIN DAY SURGERY F.S. (CUSTOM PROCEDURE TRAY) ×3 IMPLANT
STOCKINETTE 4X48 STRL (DRAPES) ×3 IMPLANT
SUT ETHILON 4 0 PS 2 18 (SUTURE) ×3 IMPLANT
SYR BULB EAR ULCER 3OZ GRN STR (SYRINGE) ×3 IMPLANT
SYR CONTROL 10ML LL (SYRINGE) ×3 IMPLANT
TOWEL GREEN STERILE FF (TOWEL DISPOSABLE) ×6 IMPLANT
UNDERPAD 30X36 HEAVY ABSORB (UNDERPADS AND DIAPERS) ×3 IMPLANT

## 2019-12-18 NOTE — H&P (Signed)
Sandra Moody is an 49 y.o. female.   Chief Complaint: left trigger thumb and carpal tunnel syndrome HPI: 49 yo female with left trigger thumb and numbness and tingling left hand.  Trigger has been injected without lasting relief.  Positive nerve conduction studies.  She wishes to have left carpal tunnel release and left trigger thumb release.  Allergies:  Allergies  Allergen Reactions  . Ginger Itching and Swelling  . Passion Fruit Flavor [Flavoring Agent] Itching and Swelling  . Shellfish Allergy Anaphylaxis  . Other     ALLERGIC TO ALL TREE NUTS  . Tamiflu [Oseltamivir Phosphate]     Past Medical History:  Diagnosis Date  . Acute renal disease   . Asthma   . Bilateral carpal tunnel syndrome 03/11/2019  . Common migraine with intractable migraine 08/29/2018  . Daily headache    (03/08/2017)  . Essential hypertension 02/20/2019  . GERD (gastroesophageal reflux disease)   . H pylori ulcer ~ 08/2016  . History of hiatal hernia   . Leaky heart valve ~ 09/2016  . Migraine    "maybe weekly" (03/08/2017)  . PONV (postoperative nausea and vomiting)   . Type 2 diabetes mellitus with hyperglycemia, without long-term current use of insulin ([place]) 02/20/2019    Past Surgical History:  Procedure Laterality Date  . CHOLECYSTECTOMY N/A 03/08/2017   Procedure: LAPAROSCOPIC CHOLECYSTECTOMY;  Surgeon: Coralie Keens, MD;  Location: [facility];  Service: General;  Laterality: N/A;  . DILATION AND CURETTAGE OF UTERUS    . LAPAROSCOPIC CHOLECYSTECTOMY  03/08/2017  . TUBAL LIGATION      Family History: Family History  Problem Relation Age of Onset  . Thyroid disease Mother   . Diabetes Maternal Grandmother   . Heart disease Maternal Grandfather     Social History:   reports that she has never smoked. She has never used smokeless tobacco. She reports current alcohol use. She reports that she does not use drugs.  Medications: Medications Prior to Admission  Medication Sig Dispense Refill  .  amLODipine (NORVASC) 2.5 MG tablet TAKE 1 TABLET BY MOUTH EVERY DAY 90 tablet 1  . BIOTIN PO Take by mouth.    . Cholecalciferol (VITAMIN D3 PO) Take by mouth.    . Cyanocobalamin (VITAMIN B-12 PO) Take by mouth.    . doxycycline (DORYX) 100 MG EC tablet Take 100 mg by mouth 2 (two) times daily.    Marland Kitchen esomeprazole (NEXIUM) 20 MG capsule Take 20 mg by mouth daily at 12 noon.    . fluticasone (FLONASE) 50 MCG/ACT nasal spray USE 1 2 SPRAYS IN EACH NOSTRIL ONCE A DAY    . Galcanezumab-gnlm (EMGALITY) 120 MG/ML SOAJ Inject 240 mg into the skin every 30 (thirty) days. 2 pen 0  . Galcanezumab-gnlm (EMGALITY) 120 MG/ML SOAJ Inject 120 mg into the skin every 30 (thirty) days. 1 pen 11  . ibuprofen (ADVIL) 800 MG tablet Take 800 mg by mouth 4 (four) times daily as needed.    Marland Kitchen ketoconazole (NIZORAL) 2 % shampoo Apply 1 application topically 2 (two) times a week. Apply to affected area lather, leave in place for 5 mins and then rinse off with water 120 mL 5  . levocetirizine (XYZAL) 5 MG tablet 1 2 TABLET IN THE EVENING ONCE A DAY ORALLY 15    . montelukast (SINGULAIR) 10 MG tablet Take 10 mg by mouth at bedtime.    Marland Kitchen olopatadine (PATANOL) 0.1 % ophthalmic solution Place 1 drop into both eyes 2 (two) times  daily. 5 mL 12  . pregabalin (LYRICA) 100 MG capsule Take 100 mg by mouth 3 (three) times daily.    . rizatriptan (MAXALT) 10 MG tablet TAKE 1 TABLET (10 MG TOTAL) BY MOUTH 3 (THREE) TIMES DAILY AS NEEDED FOR MIGRAINE. 6 tablet 6  . SYMBICORT 160-4.5 MCG/ACT inhaler INHALE 2 PUFFS INTO THE LUNGS DAILY. 10.2 Inhaler 0  . VITAMIN E PO Take by mouth.    Marland Kitchen ACCU-CHEK GUIDE test strip USE TO CHECK GLUCOSE TWICE DAILY,FASTING AND 2 HOURS AFTER DINNER 100 strip 5  . albuterol (PROVENTIL) (2.5 MG/3ML) 0.083% nebulizer solution Take 3 mLs (2.5 mg total) by nebulization every 6 (six) hours as needed for wheezing or shortness of breath. 75 mL 1  . ALBUTEROL SULFATE PO Take by mouth.    . blood glucose meter kit and  supplies Per insurance preference. Use to check glucose twice a day (fasting and 2 hours after dinner)  Dx E11.65 1 each 5  . EPIPEN 2-PAK 0.3 MG/0.3ML SOAJ injection Inject 0.3 mg into the muscle as needed (for allergic reaction).   2  . medroxyPROGESTERone (DEPO-PROVERA) 150 MG/ML injection       Results for orders placed or performed during the hospital encounter of 12/18/19 (from the past 48 hour(s))  Pregnancy, urine POC     Status: None   Collection Time: 12/18/19  7:41 AM  Result Value Ref Range   Preg Test, Ur NEGATIVE NEGATIVE    Comment:        THE SENSITIVITY OF THIS METHODOLOGY IS >24 mIU/mL   Glucose, capillary     Status: Abnormal   Collection Time: 12/18/19  7:52 AM  Result Value Ref Range   Glucose-Capillary 139 (H) 70 - 99 mg/dL    Comment: Glucose reference range applies only to samples taken after fasting for at least 8 hours.    No results found.   A comprehensive review of systems was negative.  Blood pressure 126/73, pulse 86, temperature 98.9 F (37.2 C), temperature source Oral, resp. rate 18, height 5' 4"  (1.626 m), weight 73.7 kg, SpO2 100 %.  General appearance: alert, cooperative and appears stated age Head: Normocephalic, without obvious abnormality, atraumatic Neck: supple, symmetrical, trachea midline Cardio: regular rate and rhythm Resp: clear to auscultation bilaterally Extremities: Intact sensation and capillary refill all digits.  +epl/fpl/io.  No wounds.  Pulses: 2+ and symmetric Skin: Skin color, texture, turgor normal. No rashes or lesions Neurologic: Grossly normal Incision/Wound: none  Assessment/Plan Left trigger thumb and left carpal tunnel syndrome.  Non operative and operative treatment options have been discussed with the patient and patient wishes to proceed with operative treatment. Risks, benefits, and alternatives of surgery have been discussed and the patient agrees with the plan of care.   [name] 12/18/2019, 9:45  AM

## 2019-12-18 NOTE — Discharge Instructions (Addendum)

## 2019-12-18 NOTE — Anesthesia Preprocedure Evaluation (Signed)
Anesthesia Evaluation  Patient identified by MRN, date of birth, ID band Patient awake    Reviewed: Allergy & Precautions, NPO status , Patient's Chart, lab work & pertinent test results  Airway Mallampati: II  TM Distance: >3 FB Neck ROM: Full    Dental  (+) Teeth Intact, Dental Advisory Given   Pulmonary    breath sounds clear to auscultation       Cardiovascular hypertension,  Rhythm:Regular Rate:Normal     Neuro/Psych    GI/Hepatic   Endo/Other  diabetes  Renal/GU      Musculoskeletal   Abdominal   Peds  Hematology   Anesthesia Other Findings   Reproductive/Obstetrics                             Anesthesia Physical Anesthesia Plan  ASA: III  Anesthesia Plan: Regional and MAC   Post-op Pain Management:    Induction: Intravenous  PONV Risk Score and Plan: Ondansetron and Dexamethasone  Airway Management Planned: Natural Airway and Simple Face Mask  Additional Equipment:   Intra-op Plan:   Post-operative Plan:   Informed Consent: I have reviewed the patients History and Physical, chart, labs and discussed the procedure including the risks, benefits and alternatives for the proposed anesthesia with the patient or authorized representative who has indicated his/her understanding and acceptance.     Dental advisory given  Plan Discussed with: CRNA and Anesthesiologist  Anesthesia Plan Comments:         Anesthesia Quick Evaluation

## 2019-12-18 NOTE — Op Note (Signed)
12/18/2019 [facility] OPERATIVE REPORT   PREOPERATIVE DIAGNOSIS: Left carpal tunnel syndrome Left trigger thumb.  POSTOPERATIVE DIAGNOSIS:  Left carpal tunnel syndrome Left trigger thumb.  PROCEDURE: 1.  Left carpal tunnel release 2.  Left trigger thumb release  SURGEON:  [name], MD  ASSISTANT:  None.  ANESTHESIA:  Bier block with sedation.  IV FLUIDS:  Per anesthesia flow sheet.  ESTIMATED BLOOD LOSS:  Minimal.  COMPLICATIONS:  None.  SPECIMENS:  None.  TOURNIQUET TIME:  Total Tourniquet Time Documented: Forearm (Left) - 31 minutes Total: Forearm (Left) - 31 minutes   DISPOSITION:  Stable to PACU.  LOCATION: [facility]  INDICATIONS: Sandra Moody is a 49 y.o. female with numbness and tingling left hand and triggering of left thumb.  The thumb is been injected twice without lasting resolution.  She has positive nerve conduction studies.  She wished to have a left thumb trigger digit release and left carpal tunnel release.  Risks, benefits and alternatives of surgery were discussed including the risk of blood loss, infection, damage to nerves, vessels, tendons, ligaments, bone, failure of surgery, need for additional surgery, complications with wound healing, continued pain, continued triggering, need for repeat surgery, damage to motor branch.  She voiced understanding of these risks and elected to proceed.  OPERATIVE COURSE:  After being identified preoperatively by myself, the patient and I agreed upon the procedure and site of procedure.  The surgical site was marked. The risks, benefits, and alternatives of surgery were reviewed and she wished to proceed.  Surgical consent had been signed. She was given IV Ancef as preoperative antibiotic prophylaxis. She was transported to the operating room and placed on the operating room table in supine position with the Left upper extremity on an arm board. Bier block anesthesia was induced by the  anesthesiologist.  The Left upper extremity was prepped and draped in normal sterile orthopedic fashion. A surgical pause was performed between surgeons, anesthesia, and operating room staff, and all were in agreement as to the patient, procedure, and site of procedure.  Tourniquet at the proximal aspect of the forearm had been inflated for the Bier block.  An incision was made transversely at the MP flexion crease of the thumb.  This was made through the skin only.  Spreading technique was used.  The radial and ulnar digital nerves were identified and were protected throughout the case. The flexor sheath was identified.  The A1 pulley was identified.  It was sharply incised.  It was released in its entirety.  Care was taken to avoid any release of the oblique pulley. The thumb was placed through a range of motion, there was noted to be no catching.  The wound was copiously irrigated with sterile saline. It was then closed with 4-0 nylon in a horizontal mattress fashion.   And incision was then made over the transverse carpal ligament and carried into the subcutaneous tissues by spreading technique.  Bipolar electrocautery was used to obtain hemostasis.  The palmar fascia was sharply incised.  The transverse carpal ligament was identified and sharply incised.  It was incised distally first.  The flexor tendons were identified distally.  The flexor tendon to the small finger was identified and retracted radially.  The transverse carpal ligament was then incised proximally.  Scissors were used to split the distal aspect of the volar antebrachial fascia.  A finger was placed into the wound to ensure complete decompression, which was the case.  The  nerve was examined.  It was flattened and hyperemic.  The motor branch was identified and was intact.  The wound was copiously irrigated with sterile saline.  It was then closed with 4-0 nylon in a horizontal mattress fashion. The wound was injected with  0.25% plain Marcaine  to aid in postoperative analgesia.  It was then dressed with sterile Xeroform, 4x4s, and wrapped lightly with a Coban dressing.  Tourniquet was deflated at 31 minutes.  The fingertips were pink with brisk capillary refill after deflation of the tourniquet.  The operative drapes were broken down and the patient was awoken from anesthesia safely.  She was transferred back to the stretcher and taken to the PACU in stable condition.   I will see her back in the office in 1 week for postoperative followup.  I will give her a prescription for Tramadol 50 mg 1-2 tabs PO q6 hours prn pain, dispense # 20.    Betha Loa, MD Electronically signed, 12/18/19

## 2019-12-18 NOTE — Transfer of Care (Signed)
Immediate Anesthesia Transfer of Care Note  Patient: TRISTINA SAHAGIAN  Procedure(s) Performed: CARPAL TUNNEL RELEASE (Left Hand) RELEASE TRIGGER FINGER/A-1 PULLEY (Left Thumb)  Patient Location: PACU  Anesthesia Type:MAC and Bier block  Level of Consciousness: awake, alert , oriented and drowsy  Airway & Oxygen Therapy: Patient Spontanous Breathing and Patient connected to face mask oxygen  Post-op Assessment: Report given to RN and Post -op Vital signs reviewed and stable  Post vital signs: Reviewed and stable  Last Vitals:  Vitals Value Taken Time  BP 144/74 12/18/19 1100  Temp    Pulse 83 12/18/19 1100  Resp 15 12/18/19 1100  SpO2 99 % 12/18/19 1100  Vitals shown include unvalidated device data.  Last Pain:  Vitals:   12/18/19 0735  TempSrc: Oral  PainSc: 3       Patients Stated Pain Goal: 3 (13/24/40 1027)  Complications: No apparent anesthesia complications

## 2019-12-18 NOTE — Anesthesia Postprocedure Evaluation (Signed)
Anesthesia Post Note  Patient: Sandra Moody  Procedure(s) Performed: CARPAL TUNNEL RELEASE (Left Hand) RELEASE TRIGGER FINGER/A-1 PULLEY (Left Thumb)     Patient location during evaluation: PACU Anesthesia Type: Regional Level of consciousness: awake and alert Pain management: pain level controlled Vital Signs Assessment: post-procedure vital signs reviewed and stable Respiratory status: spontaneous breathing, nonlabored ventilation, respiratory function stable and patient connected to nasal cannula oxygen Cardiovascular status: stable and blood pressure returned to baseline Postop Assessment: no apparent nausea or vomiting Anesthetic complications: no    Last Vitals:  Vitals:   12/18/19 1128 12/18/19 1200  BP: (!) 138/96 (!) 153/95  Pulse: 77 84  Resp: 15 16  Temp:  36.9 C  SpO2: 100% 99%    Last Pain:  Vitals:   12/18/19 1200  TempSrc:   PainSc: 0-No pain                 Samuella Rasool COKER

## 2019-12-22 ENCOUNTER — Encounter: Payer: Self-pay | Admitting: *Deleted

## 2020-01-05 ENCOUNTER — Encounter: Payer: Self-pay | Admitting: Family Medicine

## 2020-01-05 ENCOUNTER — Other Ambulatory Visit: Payer: Self-pay

## 2020-01-05 ENCOUNTER — Ambulatory Visit (INDEPENDENT_AMBULATORY_CARE_PROVIDER_SITE_OTHER): Payer: Managed Care, Other (non HMO) | Admitting: Family Medicine

## 2020-01-05 VITALS — BP 132/87 | HR 88 | Temp 97.9°F | Ht 64.0 in | Wt 160.8 lb

## 2020-01-05 DIAGNOSIS — E1165 Type 2 diabetes mellitus with hyperglycemia: Secondary | ICD-10-CM

## 2020-01-05 DIAGNOSIS — R7309 Other abnormal glucose: Secondary | ICD-10-CM | POA: Diagnosis not present

## 2020-01-05 LAB — POCT GLYCOSYLATED HEMOGLOBIN (HGB A1C): Hemoglobin A1C: 8.4 % — AB (ref 4.0–5.6)

## 2020-01-05 MED ORDER — SITAGLIPTIN PHOSPHATE 25 MG PO TABS
25.0000 mg | ORAL_TABLET | Freq: Every day | ORAL | 1 refills | Status: DC
Start: 2020-01-05 — End: 2020-01-09

## 2020-01-05 NOTE — Progress Notes (Signed)
6/7/202110:07 AM  Sandra Moody 12/22/1970, 49 y.o., female [id number]  Chief Complaint  Patient presents with  . Hyperglycemia    finding that sugars a slightly higher, noticed the changed after hand surgery 5/20. Highest ins 300's before eating    HPI:   Patient is a 49 y.o. female with past medical history significant for DM2,HTN, migraines, asthma, GERD, biliary dyskinesia, who presents today for routine followup  Last OV March 2021 - was doing well on diet  Had LEFT trigger thumb and carpal tunnel release surgeries on Dec 18 2019  DM meds tried Glipizide - hypoglycemia with several ER visits Metformin - GI side effects actos - hypoglycemia, chest pain with several ER visits  She started noticing high cbgs for past couple of weeks [place], 194, 204, 280, 186, 182 Bedtime, 324, 208, 174  No new meds  Drinking lots of water, increased thirst, started prior to surgery She has been eating more peanut butter trying to get more protein She is also walking every day She reports feeling very sleep She has not had any low cbgs  Lab Results  Component Value Date   HGBA1C 6.1 (A) 08/07/2019   Lab Results  Component Value Date   CREATININE 1.00 12/15/2019   BUN 7 12/15/2019   NA 139 12/15/2019   K 3.6 12/15/2019   CL 107 12/15/2019   CO2 22 12/15/2019    Depression screen PHQ 2/9 11/13/2019 10/10/2019 09/12/2019  Decreased Interest 0 0 0  Down, Depressed, Hopeless 0 0 0  PHQ - 2 Score 0 0 0    Fall Risk  01/05/2020 11/13/2019 10/10/2019 09/12/2019 05/23/2019  Falls in the past year? 0 0 0 0 0  Number falls in past yr: 0 - 0 0 0  Injury with Fall? 0 - 0 0 0  Follow up - Falls evaluation completed Falls evaluation completed Falls evaluation completed -     Allergies  Allergen Reactions  . Ginger Itching and Swelling  . Passion Fruit Flavor [Flavoring Agent] Itching and Swelling  . Shellfish Allergy Anaphylaxis  . Other     ALLERGIC TO ALL TREE NUTS  . Tamiflu  [Oseltamivir Phosphate]     Prior to Admission medications   Medication Sig Start Date End Date Taking? Authorizing Provider  ACCU-CHEK GUIDE test strip USE TO CHECK GLUCOSE TWICE DAILY,FASTING AND 2 HOURS AFTER DINNER 07/15/19  Yes Rutherford Guys, MD  albuterol (PROVENTIL) (2.5 MG/3ML) 0.083% nebulizer solution Take 3 mLs (2.5 mg total) by nebulization every 6 (six) hours as needed for wheezing or shortness of breath. 11/13/19  Yes Wendie Agreste, MD  ALBUTEROL SULFATE PO Take by mouth.   Yes [provider]  amLODipine (NORVASC) 2.5 MG tablet TAKE 1 TABLET BY MOUTH EVERY DAY 11/02/19  Yes Rutherford Guys, MD  BIOTIN PO Take by mouth.   Yes [provider]  blood glucose meter kit and supplies Per insurance preference. Use to check glucose twice a day (fasting and 2 hours after dinner)  Dx E11.65 10/30/18  Yes Rutherford Guys, MD  cefTRIAXone (ROCEPHIN) 250 MG injection 250 mg.   Yes [provider]  Cholecalciferol (VITAMIN D3 PO) Take by mouth.   Yes [provider]  Cyanocobalamin (VITAMIN B-12 PO) Take by mouth.   Yes [provider]  EPIPEN 2-PAK 0.3 MG/0.3ML SOAJ injection Inject 0.3 mg into the muscle as needed (for allergic reaction).  05/18/16  Yes [provider]  fluticasone (FLONASE) 50  MCG/ACT nasal spray USE 1 2 SPRAYS IN EACH NOSTRIL ONCE A DAY 10/25/18  Yes [provider]  Galcanezumab-gnlm (EMGALITY) 120 MG/ML SOAJ Inject 240 mg into the skin every 30 (thirty) days. 10/21/19  Yes Suzzanne Cloud, NP  Galcanezumab-gnlm (EMGALITY) 120 MG/ML SOAJ Inject 120 mg into the skin every 30 (thirty) days. 10/21/19  Yes Suzzanne Cloud, NP  ibuprofen (ADVIL) 800 MG tablet Take 800 mg by mouth 4 (four) times daily as needed. 05/16/19  Yes [provider]  ketoconazole (NIZORAL) 2 % shampoo Apply 1 application topically 2 (two) times a week. Apply to affected area lather, leave in place for 5 mins and then rinse off with  water 05/05/19  Yes Rutherford Guys, MD  levocetirizine (XYZAL) 5 MG tablet 1 2 TABLET IN THE EVENING ONCE A DAY ORALLY 15 11/05/18  Yes [provider]  medroxyPROGESTERone (DEPO-PROVERA) 150 MG/ML injection  08/15/18  Yes [provider]  montelukast (SINGULAIR) 10 MG tablet Take 10 mg by mouth at bedtime.   Yes [provider]  olopatadine (PATANOL) 0.1 % ophthalmic solution Place 1 drop into both eyes 2 (two) times daily. 12/25/18  Yes Corum, Rex Kras, MD  pregabalin (LYRICA) 100 MG capsule Take 100 mg by mouth 3 (three) times daily. 10/09/19  Yes [provider]  rizatriptan (MAXALT) 10 MG tablet TAKE 1 TABLET (10 MG TOTAL) BY MOUTH 3 (THREE) TIMES DAILY AS NEEDED FOR MIGRAINE. 06/02/19  Yes Kathrynn Ducking, MD  SYMBICORT 160-4.5 MCG/ACT inhaler INHALE 2 PUFFS INTO THE LUNGS DAILY. 05/09/18  Yes Valentina Shaggy, MD  VITAMIN E PO Take by mouth.   Yes [provider]    Past Medical History:  Diagnosis Date  . Acute renal disease   . Asthma   . Bilateral carpal tunnel syndrome 03/11/2019  . Common migraine with intractable migraine 08/29/2018  . Daily headache    (03/08/2017)  . Essential hypertension 02/20/2019  . GERD (gastroesophageal reflux disease)   . H pylori ulcer ~ 08/2016  . History of hiatal hernia   . Leaky heart valve ~ 09/2016  . Migraine    "maybe weekly" (03/08/2017)  . PONV (postoperative nausea and vomiting)   . Type 2 diabetes mellitus with hyperglycemia, without long-term current use of insulin ([place]) 02/20/2019    Past Surgical History:  Procedure Laterality Date  . CARPAL TUNNEL RELEASE Left 12/18/2019   Procedure: CARPAL TUNNEL RELEASE;  Surgeon: [name], MD;  Location: [facility];  Service: Orthopedics;  Laterality: Left;  . CHOLECYSTECTOMY N/A 03/08/2017   Procedure: LAPAROSCOPIC CHOLECYSTECTOMY;  Surgeon: Coralie Keens, MD;  Location: [facility];  Service: General;  Laterality: N/A;  . DILATION AND  CURETTAGE OF UTERUS    . LAPAROSCOPIC CHOLECYSTECTOMY  03/08/2017  . TRIGGER FINGER RELEASE Left 12/18/2019   Procedure: RELEASE TRIGGER FINGER/A-1 PULLEY;  Surgeon: [name], MD;  Location: [facility];  Service: Orthopedics;  Laterality: Left;  . TUBAL LIGATION      Social History   Tobacco Use  . Smoking status: Never Smoker  . Smokeless tobacco: Never Used  Substance Use Topics  . Alcohol use: Yes    Comment: 03/08/2017 "might have a drink on a holiday"    Family History  Problem Relation Age of Onset  . Thyroid disease Mother   . Diabetes Maternal Grandmother   . Heart disease Maternal Grandfather     Review of Systems  Constitutional: Negative for chills and fever.  Respiratory: Negative for cough and shortness of breath.   Cardiovascular: Negative for chest pain, palpitations and leg swelling.  Gastrointestinal: Negative for abdominal pain, nausea and vomiting.   Per hpi  OBJECTIVE:  Today's Vitals   01/05/20 1000  BP: 132/87  Pulse: 88  Temp: 97.9 F (36.6 C)  SpO2: 98%  Weight: 160 lb 12.8 oz (72.9 kg)  Height: _0  (1.626 m)   Body mass index is 27.6 kg/m.   Physical Exam Vitals and nursing note reviewed.  Constitutional:      Appearance: She is well-developed.  HENT:     Head: Normocephalic and atraumatic.  Eyes:     General: No scleral icterus.    Conjunctiva/sclera: Conjunctivae normal.     Pupils: Pupils are equal, round, and reactive to light.  Pulmonary:     Effort: Pulmonary effort is normal.  Musculoskeletal:     Cervical back: Neck supple.  Skin:    General: Skin is warm and dry.  Neurological:     Mental Status: She is alert and oriented to person, place, and time.     Results for orders placed or performed in visit on 01/05/20 (from the past 24 hour(s))  POCT A1C     Status: Abnormal   Collection Time: 01/05/20 10:31 AM  Result Value Ref Range   Hemoglobin A1C 8.4 (A) 4.0 - 5.6 %   HbA1c POC (<> result,  manual entry)     HbA1c, POC (prediabetic range)     HbA1c, POC (controlled diabetic range)      No results found.   ASSESSMENT and PLAN  1. Type 2 diabetes mellitus with hyperglycemia, without long-term current use of insulin ([facility]) Uncontrolled. Patient intolerant of metformin, actos and glipizide, with liable cbgs and history of recurrent hypoglycemia. Discussed options, trial of very low dose [place]. Continue with LFM. Diet very limited due to patient's food preferences. Labs to r/o type 1.  - POCT A1C  2. Labile blood glucose - C-peptide; Future - Glutamic acid decarboxylase auto abs; Future  Other orders - sitaGLIPtin (JANUVIA) 25 MG tablet; Take 1 tablet (25 mg total) by mouth daily.  Return in about 1 week (around 01/12/2020).    Rutherford Guys, MD [facility] [place], [place] [address] Ph.  [phone] Fax [phone]

## 2020-01-05 NOTE — Patient Instructions (Signed)
° ° ° °  If you have lab work done today you will be contacted with your lab results within the next 2 weeks.  If you have not heard from us then please contact us. The fastest way to get your results is to register for My Chart. ° ° °IF you received an x-ray today, you will receive an invoice from [facility]. Please contact [facility] at [phone] with questions or concerns regarding your invoice.  ° °IF you received labwork today, you will receive an invoice from [facility]. Please contact [facility] at [phone] with questions or concerns regarding your invoice.  ° °Our billing staff will not be able to assist you with questions regarding bills from these companies. ° °You will be contacted with the lab results as soon as they are available. The fastest way to get your results is to activate your My Chart account. Instructions are located on the last page of this paperwork. If you have not heard from us regarding the results in 2 weeks, please contact this office. °  ° ° ° °

## 2020-01-09 ENCOUNTER — Other Ambulatory Visit: Payer: Self-pay

## 2020-01-09 ENCOUNTER — Encounter: Payer: Self-pay | Admitting: Internal Medicine

## 2020-01-09 ENCOUNTER — Ambulatory Visit (INDEPENDENT_AMBULATORY_CARE_PROVIDER_SITE_OTHER): Payer: Managed Care, Other (non HMO) | Admitting: Internal Medicine

## 2020-01-09 VITALS — BP 124/82 | HR 87 | Ht 64.0 in | Wt 163.4 lb

## 2020-01-09 DIAGNOSIS — E119 Type 2 diabetes mellitus without complications: Secondary | ICD-10-CM | POA: Diagnosis not present

## 2020-01-09 DIAGNOSIS — E1165 Type 2 diabetes mellitus with hyperglycemia: Secondary | ICD-10-CM

## 2020-01-09 MED ORDER — SITAGLIPTIN PHOSPHATE 50 MG PO TABS
50.0000 mg | ORAL_TABLET | Freq: Every day | ORAL | 3 refills | Status: DC
Start: 2020-01-09 — End: 2020-02-27

## 2020-01-09 NOTE — Progress Notes (Signed)
Name: FINLAY MILLS  Age/ Sex: 49 y.o., female   MRN/ DOB: [id number], 1970/12/10     PCP: Rutherford Guys, MD   Reason for Endocrinology Evaluation: Type 2 Diabetes Mellitus  Initial Endocrine Consultative Visit: 09/05/2019    PATIENT IDENTIFIER: Ms. KAROLYNN INFANTINO is a 49 y.o. female with a past medical history of asthma. The patient has followed with Endocrinology clinic since 09/05/2019 for consultative assistance with management of her diabetes.  DIABETIC HISTORY:  Ms. Vokes was diagnosed with DM in 09/2018.  She has been on multiple oral glycemic agents with reported intolerance.   Glipizide caused stomach issues as well as Metformin.  Actos caused hypoglycemia and was discontinued in 07/2019 , she has not been on insulin before. Her hemoglobin A1c has ranged from 6.1% in 08/2019, peaking at 10.3%  in 2020  On her initial visit to our clinic she was not on any oral glycemic agents, and declined any oral medications at the time.  By 12/2019 she presented to her PCP with hypoglycemia and BG is in the 300s patient was started on Januvia at the time.   SUBJECTIVE:   During the last visit (09/05/2019): A1c 6.1%, in office BG 211 mg/dL.  Patient declining oral glycemic agents  Today (01/09/2020): Ms. Dehne is here for follow-up on diabetes management She checks her blood sugars multiple times daily. The patient has not had hypoglycemic episodes since the last clinic visit.     HOME DIABETES REGIMEN:  Januvia 25 mg daily      METER DOWNLOAD SUMMARY: Date range evaluated: 5/13-6/05/2020 Fingerstick Blood Glucose Tests = 61 Average Number Tests/Day = 2 Overall Mean FS Glucose = 223 Standard Deviation = 65.7  BG Ranges: Low = 122 High = 349   Hypoglycemic Events/30 Days: BG < 50 = 0 Episodes of symptomatic severe hypoglycemia = 0   DIABETIC COMPLICATIONS: Microvascular complications:    Denies: neuropathy , retinopathy,   Last eye exam: Completed 09/2018  Macrovascular  complications:    Denies: CAD, PVD, CVA  HISTORY:  Past Medical History:  Past Medical History:  Diagnosis Date  . Acute renal disease   . Asthma   . Bilateral carpal tunnel syndrome 03/11/2019  . Common migraine with intractable migraine 08/29/2018  . Daily headache    (03/08/2017)  . Essential hypertension 02/20/2019  . GERD (gastroesophageal reflux disease)   . H pylori ulcer ~ 08/2016  . History of hiatal hernia   . Leaky heart valve ~ 09/2016  . Migraine    "maybe weekly" (03/08/2017)  . PONV (postoperative nausea and vomiting)   . Type 2 diabetes mellitus with hyperglycemia, without long-term current use of insulin ([place]) 02/20/2019    Past Surgical History:  Past Surgical History:  Procedure Laterality Date  . CARPAL TUNNEL RELEASE Left 12/18/2019   Procedure: CARPAL TUNNEL RELEASE;  Surgeon: [name], MD;  Location: [facility];  Service: Orthopedics;  Laterality: Left;  . CHOLECYSTECTOMY N/A 03/08/2017   Procedure: LAPAROSCOPIC CHOLECYSTECTOMY;  Surgeon: Coralie Keens, MD;  Location: [facility];  Service: General;  Laterality: N/A;  . DILATION AND CURETTAGE OF UTERUS    . LAPAROSCOPIC CHOLECYSTECTOMY  03/08/2017  . TRIGGER FINGER RELEASE Left 12/18/2019   Procedure: RELEASE TRIGGER FINGER/A-1 PULLEY;  Surgeon: [name], MD;  Location: [facility];  Service: Orthopedics;  Laterality: Left;  . TUBAL LIGATION       Social History:  reports that she has never smoked. She has never  used smokeless tobacco. She reports current alcohol use. She reports that she does not use drugs. Family History:  Family History  Problem Relation Age of Onset  . Thyroid disease Mother   . Diabetes Maternal Grandmother   . Heart disease Maternal Grandfather       HOME MEDICATIONS: Allergies as of 01/09/2020      Reactions   Ginger Itching, Swelling   Passion Fruit Flavor [flavoring Agent] Itching, Swelling   Shellfish Allergy Anaphylaxis   Other     ALLERGIC TO ALL TREE NUTS   Tamiflu [oseltamivir Phosphate]       Medication List       Accurate as of January 09, 2020  8:01 AM. If you have any questions, ask your nurse or doctor.        Accu-Chek Guide test strip Generic drug: glucose blood USE TO CHECK GLUCOSE TWICE DAILY,FASTING AND 2 HOURS AFTER DINNER   ALBUTEROL SULFATE PO Take by mouth.   albuterol (2.5 MG/3ML) 0.083% nebulizer solution Commonly known as: PROVENTIL Take 3 mLs (2.5 mg total) by nebulization every 6 (six) hours as needed for wheezing or shortness of breath.   amLODipine 2.5 MG tablet Commonly known as: NORVASC TAKE 1 TABLET BY MOUTH EVERY DAY   BIOTIN PO Take by mouth.   blood glucose meter kit and supplies Per insurance preference. Use to check glucose twice a day (fasting and 2 hours after dinner)  Dx E11.65   cefTRIAXone 250 MG injection Commonly known as: ROCEPHIN 250 mg.   Emgality 120 MG/ML Soaj Generic drug: Galcanezumab-gnlm Inject 240 mg into the skin every 30 (thirty) days.   Emgality 120 MG/ML Soaj Generic drug: Galcanezumab-gnlm Inject 120 mg into the skin every 30 (thirty) days.   EpiPen 2-Pak 0.3 mg/0.3 mL Soaj injection Generic drug: EPINEPHrine Inject 0.3 mg into the muscle as needed (for allergic reaction).   fluticasone 50 MCG/ACT nasal spray Commonly known as: FLONASE USE 1 2 SPRAYS IN EACH NOSTRIL ONCE A DAY   ibuprofen 800 MG tablet Commonly known as: ADVIL Take 800 mg by mouth 4 (four) times daily as needed.   ketoconazole 2 % shampoo Commonly known as: NIZORAL Apply 1 application topically 2 (two) times a week. Apply to affected area lather, leave in place for 5 mins and then rinse off with water   levocetirizine 5 MG tablet Commonly known as: XYZAL 1 2 TABLET IN THE EVENING ONCE A DAY ORALLY 15   medroxyPROGESTERone 150 MG/ML injection Commonly known as: DEPO-PROVERA   montelukast 10 MG tablet Commonly known as: SINGULAIR Take 10 mg by mouth at  bedtime.   olopatadine 0.1 % ophthalmic solution Commonly known as: Patanol Place 1 drop into both eyes 2 (two) times daily.   pregabalin 100 MG capsule Commonly known as: LYRICA Take 100 mg by mouth 3 (three) times daily.   rizatriptan 10 MG tablet Commonly known as: MAXALT TAKE 1 TABLET (10 MG TOTAL) BY MOUTH 3 (THREE) TIMES DAILY AS NEEDED FOR MIGRAINE.   sitaGLIPtin 25 MG tablet Commonly known as: Januvia Take 1 tablet (25 mg total) by mouth daily.   Symbicort 160-4.5 MCG/ACT inhaler Generic drug: budesonide-formoterol INHALE 2 PUFFS INTO THE LUNGS DAILY.   VITAMIN B-12 PO Take by mouth.   VITAMIN D3 PO Take by mouth.   VITAMIN E PO Take by mouth.        OBJECTIVE:   Vital Signs: BP 124/82 (BP Location: Left Arm, Patient Position: Sitting, Cuff Size: Normal)  Pulse 87   Ht _0  (1.626 m)   Wt 163 lb 6.4 oz (74.1 kg)   SpO2 97%   BMI 28.05 kg/m   Wt Readings from Last 3 Encounters:  01/09/20 163 lb 6.4 oz (74.1 kg)  01/05/20 160 lb 12.8 oz (72.9 kg)  12/18/19 162 lb 7.7 oz (73.7 kg)     Exam: General: Pt appears well and is in NAD  Lungs: Clear with good BS bilat with no rales, rhonchi, or wheezes  Heart: RRR with normal S1 and S2 and no gallops; no murmurs; no rub  Abdomen: Normoactive bowel sounds, soft, nontender, without masses or organomegaly palpable  Extremities: No pretibial edema.   Neuro: MS is good with appropriate affect, pt is alert and Ox3     DM foot exam: 09/05/2019  The skin of the feet is intact without sores or ulcerations. The pedal pulses are 2+ on right and 2+ on left. The sensation is intact to a screening 5.07, 10 gram monofilament bilaterally    DATA REVIEWED:  Lab Results  Component Value Date   HGBA1C 8.4 (A) 01/05/2020   HGBA1C 6.1 (A) 08/07/2019   HGBA1C 6.7 (A) 02/20/2019   Lab Results  Component Value Date   LDLCALC 72 08/07/2019   CREATININE 1.00 12/15/2019   Lab Results  Component Value Date    MICRALBCREAT 11 10/10/2019     Lab Results  Component Value Date   CHOL 124 08/07/2019   HDL 35 (L) 08/07/2019   LDLCALC 72 08/07/2019   TRIG 86 08/07/2019   CHOLHDL 3.5 08/07/2019         ASSESSMENT / PLAN / RECOMMENDATIONS:   1) Type 2 Diabetes Mellitus, Poorly controlled, Without complications - Most recent A1c of 8.4 %. Goal A1c < %.    -Worsening glycemic control over the past few months while of any oral glycemic agents.  Patient attributes hyperglycemia to her recent hand surgery, but explained to her her hypoglycemia started prior to her surgery as the A1c takes at least 3 months to increase. -Patient was started on Januvia by her PCP, patient is very skeptical about oral glycemic agents due to previous side effects hence started her on a small dose. -Today she denies any side effects to Januvia, but her BG's have continually been over 200 mg/dL -Patient tells me her PCP is going to have antibody checked for autoimmune diabetes, patient does have orders for GAD-65, and C-peptide, I am going to add islet cell antibodies as well as serum glucose to her labs for next week. -I am going to increase Januvia as below, patient to continue checking glucose and to contact us if BG is over 200 in 2 weeks.    MEDICATIONS:  Increase Januvia to 50 mg daily  EDUCATION / INSTRUCTIONS:  BG monitoring instructions: Patient is instructed to check her blood sugars 2 times a day.  Call [facility] if: BG persistently  > 200. . I reviewed the Rule of 15 for the treatment of hypoglycemia in detail with the patient. Literature supplied.    2) Diabetic complications:   Eye: Does not have known diabetic retinopathy.   Neuro/ Feet: Does not have known diabetic peripheral neuropathy .   Renal: Patient does not have known baseline CKD.   F/U in 3 months  Signed electronically by: Mack Guise, MD  [facility] End[facility]inology  [facility] [address] [place], [place] [address] Phone: [phone] FAX: [phone]  CC: Rutherford Guys, MD [address] [place] [address] Phone: [phone]  Fax: [phone]  Return to Endocrinology clinic as below: Future Appointments  Date Time Provider [place]  01/13/2020  4:40 PM Rutherford Guys, MD PCP-PCP [facility]  02/26/2020  2:15 PM Suzzanne Cloud, NP [facility] None

## 2020-01-09 NOTE — Patient Instructions (Signed)
-   Increase Januvia to 50 mg daily, if you sugars continue to be over 200 , after 2 weeks of using this dose please contact  the office

## 2020-01-13 ENCOUNTER — Other Ambulatory Visit: Payer: Self-pay

## 2020-01-13 ENCOUNTER — Encounter: Payer: Self-pay | Admitting: Family Medicine

## 2020-01-13 ENCOUNTER — Ambulatory Visit (INDEPENDENT_AMBULATORY_CARE_PROVIDER_SITE_OTHER): Payer: Managed Care, Other (non HMO) | Admitting: Family Medicine

## 2020-01-13 DIAGNOSIS — E119 Type 2 diabetes mellitus without complications: Secondary | ICD-10-CM

## 2020-01-13 DIAGNOSIS — R7309 Other abnormal glucose: Secondary | ICD-10-CM | POA: Diagnosis not present

## 2020-01-13 LAB — GLUCOSE, POCT (MANUAL RESULT ENTRY): POC Glucose: 166 mg/dl — AB (ref 70–99)

## 2020-01-13 MED ORDER — KETOCONAZOLE 2 % EX SHAM: 1.0000 "application " | MEDICATED_SHAMPOO | CUTANEOUS | 5 refills | Status: AC

## 2020-01-13 NOTE — Progress Notes (Signed)
 6/15/20215:29 PM  Sandra Moody 11/16/1970, 49 y.o., female [id number]  Chief Complaint  Patient presents with  . Diabetes    draw labs from endo [facility]    HPI:   Patient is a 49 y.o. female with past medical history significant for DM2,HTN, migraines, asthma, GERD, biliary dyskinesia,who presents today for routine followup  Saw endo 4 days ago - increased januvia to 50mg and added more labs to r/o autoimmune DM  Tolerating increase in januvia, no lows, cbgs seem to be coming down This morning 151, 169, 188, 170 Bedtime 269, 225  Depression screen PHQ 2/9 11/13/2019 10/10/2019 09/12/2019  Decreased Interest 0 0 0  Down, Depressed, Hopeless 0 0 0  PHQ - 2 Score 0 0 0    Fall Risk  01/05/2020 11/13/2019 10/10/2019 09/12/2019 05/23/2019  Falls in the past year? 0 0 0 0 0  Number falls in past yr: 0 - 0 0 0  Injury with Fall? 0 - 0 0 0  Follow up - Falls evaluation completed Falls evaluation completed Falls evaluation completed -     Allergies  Allergen Reactions  . Ginger Itching and Swelling  . Passion Fruit Flavor [Flavoring Agent] Itching and Swelling  . Shellfish Allergy Anaphylaxis  . Other     ALLERGIC TO ALL TREE NUTS  . Tamiflu [Oseltamivir Phosphate]     Prior to Admission medications   Medication Sig Start Date End Date Taking? Authorizing Provider  ACCU-CHEK GUIDE test strip USE TO CHECK GLUCOSE TWICE DAILY,FASTING AND 2 HOURS AFTER DINNER 07/15/19  Yes Santiago, Irma M, MD  albuterol (PROVENTIL) (2.5 MG/3ML) 0.083% nebulizer solution Take 3 mLs (2.5 mg total) by nebulization every 6 (six) hours as needed for wheezing or shortness of breath. 11/13/19  Yes Greene, Jeffrey R, MD  ALBUTEROL SULFATE PO Take by mouth.   Yes [provider]  amLODipine (NORVASC) 2.5 MG tablet TAKE 1 TABLET BY MOUTH EVERY DAY 11/02/19  Yes Santiago, Irma M, MD  BIOTIN PO Take by mouth.   Yes [provider]  blood glucose meter kit and supplies Per insurance  preference. Use to check glucose twice a day (fasting and 2 hours after dinner)  Dx E11.65 10/30/18  Yes Santiago, Irma M, MD  Cholecalciferol (VITAMIN D3 PO) Take by mouth.   Yes [provider]  Cyanocobalamin (VITAMIN B-12 PO) Take by mouth.   Yes [provider]  EPIPEN 2-PAK 0.3 MG/0.3ML SOAJ injection Inject 0.3 mg into the muscle as needed (for allergic reaction).  05/18/16  Yes [provider]  fluticasone (FLONASE) 50 MCG/ACT nasal spray USE 1 2 SPRAYS IN EACH NOSTRIL ONCE A DAY 10/25/18  Yes [provider]  Galcanezumab-gnlm (EMGALITY) 120 MG/ML SOAJ Inject 240 mg into the skin every 30 (thirty) days. 10/21/19  Yes Slack, Sarah J, NP  Galcanezumab-gnlm (EMGALITY) 120 MG/ML SOAJ Inject 120 mg into the skin every 30 (thirty) days. 10/21/19  Yes Slack, Sarah J, NP  ibuprofen (ADVIL) 800 MG tablet Take 800 mg by mouth 4 (four) times daily as needed. 05/16/19  Yes [provider]  ketoconazole (NIZORAL) 2 % shampoo Apply 1 application topically 2 (two) times a week. Apply to affected area lather, leave in place for 5 mins and then rinse off with water 05/05/19  Yes Santiago, Irma M, MD  levocetirizine (XYZAL) 5 MG tablet 1 2 TABLET IN THE EVENING ONCE A DAY ORALLY 15 11/05/18  Yes [provider]  medroxyPROGESTERone (DEPO-PROVERA) 150 MG/ML injection    08/15/18  Yes [provider]  montelukast (SINGULAIR) 10 MG tablet Take 10 mg by mouth at bedtime.   Yes [provider]  olopatadine (PATANOL) 0.1 % ophthalmic solution Place 1 drop into both eyes 2 (two) times daily. 12/25/18  Yes Corum, Lisa L, MD  pregabalin (LYRICA) 100 MG capsule Take 100 mg by mouth 3 (three) times daily. 10/09/19  Yes [provider]  rizatriptan (MAXALT) 10 MG tablet TAKE 1 TABLET (10 MG TOTAL) BY MOUTH 3 (THREE) TIMES DAILY AS NEEDED FOR MIGRAINE. 06/02/19  Yes Willis, Charles K, MD  sitaGLIPtin (JANUVIA) 50 MG tablet Take 1 tablet (50 mg total) by  mouth daily. 01/09/20  Yes Shamleffer, Ibtehal Jaralla, MD  SYMBICORT 160-4.5 MCG/ACT inhaler INHALE 2 PUFFS INTO THE LUNGS DAILY. 05/09/18  Yes Gallagher, Joel Louis, MD  VITAMIN E PO Take by mouth.   Yes [provider]    Past Medical History:  Diagnosis Date  . Acute renal disease   . Asthma   . Bilateral carpal tunnel syndrome 03/11/2019  . Common migraine with intractable migraine 08/29/2018  . Daily headache    (03/08/2017)  . Essential hypertension 02/20/2019  . GERD (gastroesophageal reflux disease)   . H pylori ulcer ~ 08/2016  . History of hiatal hernia   . Leaky heart valve ~ 09/2016  . Migraine    "maybe weekly" (03/08/2017)  . PONV (postoperative nausea and vomiting)   . Type 2 diabetes mellitus with hyperglycemia, without long-term current use of insulin (HCC) 02/20/2019    Past Surgical History:  Procedure Laterality Date  . CARPAL TUNNEL RELEASE Left 12/18/2019   Procedure: CARPAL TUNNEL RELEASE;  Surgeon: Kuzma, Kevin, MD;  Location: [facility];  Service: Orthopedics;  Laterality: Left;  . CHOLECYSTECTOMY N/A 03/08/2017   Procedure: LAPAROSCOPIC CHOLECYSTECTOMY;  Surgeon: Blackman, Douglas, MD;  Location: [facility];  Service: General;  Laterality: N/A;  . DILATION AND CURETTAGE OF UTERUS    . LAPAROSCOPIC CHOLECYSTECTOMY  03/08/2017  . TRIGGER FINGER RELEASE Left 12/18/2019   Procedure: RELEASE TRIGGER FINGER/A-1 PULLEY;  Surgeon: Kuzma, Kevin, MD;  Location: [facility];  Service: Orthopedics;  Laterality: Left;  . TUBAL LIGATION      Social History   Tobacco Use  . Smoking status: Never Smoker  . Smokeless tobacco: Never Used  Substance Use Topics  . Alcohol use: Yes    Comment: 03/08/2017 "might have a drink on a holiday"    Family History  Problem Relation Age of Onset  . Thyroid disease Mother   . Diabetes Maternal Grandmother   . Heart disease Maternal Grandfather     ROS Per hpi  OBJECTIVE:  Today's Vitals    01/13/20 1710  BP: 121/79  Pulse: 93  Temp: 97.8 F (36.6 C)  SpO2: 99%  Weight: 159 lb (72.1 kg)  Height: 5' 4" (1.626 m)   Body mass index is 27.29 kg/m.   Physical Exam Vitals and nursing note reviewed.  Constitutional:      Appearance: She is well-developed.  HENT:     Head: Normocephalic and atraumatic.  Eyes:     General: No scleral icterus.    Conjunctiva/sclera: Conjunctivae normal.     Pupils: Pupils are equal, round, and reactive to light.  Pulmonary:     Effort: Pulmonary effort is normal.  Musculoskeletal:     Cervical back: Neck supple.  Skin:    General: Skin is warm and dry.  Neurological:     Mental   Status: She is alert and oriented to person, place, and time.     Results for orders placed or performed in visit on 01/13/20 (from the past 24 hour(s))  POCT glucose (manual entry)     Status: Abnormal   Collection Time: 01/13/20  6:01 PM  Result Value Ref Range   POC Glucose 166 (A) 70 - 99 mg/dl    No results found.   ASSESSMENT and PLAN  1. Type 2 diabetes mellitus without complication, without long-term current use of insulin (HCC) Tolerating recent increase in [place], cont checking cbgs (fasting and 2PP dinner), plan to increase to max dose 180m daily if cbgs remains above 200. Cont working on [employer]  2. Labile blood glucose - Glutamic acid decarboxylase auto abs - C-peptide - Anti-islet cell antibody - POCT glucose (manual entry)  Other orders - ketoconazole (NIZORAL) 2 % shampoo; Apply 1 application topically 2 (two) times a week. Apply to affected area lather, leave in place for 5 mins and then rinse off with water  Return in about 4 weeks (around 02/10/2020).    IRutherford Guys MD [facility] [id number].  [phone] [phone]

## 2020-01-14 LAB — C-PEPTIDE: C-Peptide: 8.3 ng/mL — ABNORMAL HIGH (ref 1.1–4.4)

## 2020-01-14 LAB — GLUTAMIC ACID DECARBOXYLASE AUTO ABS: Glutamic Acid Decarb Ab: <5 U/mL (ref 0.0–5.0)

## 2020-01-14 LAB — ANTI-ISLET CELL ANTIBODY: Islet Cell Ab: NEGATIVE

## 2020-01-25 ENCOUNTER — Other Ambulatory Visit
Admission: RE | Admit: 2020-01-25 | Discharge: 2020-01-25 | Disposition: A | Discharge status: Home / Self Care | Payer: Managed Care, Other (non HMO) | Source: Ambulatory Visit | Attending: Internal Medicine | Admitting: Internal Medicine

## 2020-01-25 ENCOUNTER — Ambulatory Visit
Admission: EM | Admit: 2020-01-25 | Discharge: 2020-01-25 | Disposition: A | Discharge status: Home / Self Care | Payer: Managed Care, Other (non HMO) | Attending: Physician Assistant | Admitting: Physician Assistant

## 2020-01-25 ENCOUNTER — Other Ambulatory Visit: Payer: Self-pay

## 2020-01-25 DIAGNOSIS — R739 Hyperglycemia, unspecified: Secondary | ICD-10-CM | POA: Diagnosis not present

## 2020-01-25 DIAGNOSIS — R5383 Other fatigue: Secondary | ICD-10-CM

## 2020-01-25 DIAGNOSIS — R197 Diarrhea, unspecified: Secondary | ICD-10-CM

## 2020-01-25 DIAGNOSIS — R1032 Left lower quadrant pain: Secondary | ICD-10-CM

## 2020-01-25 LAB — CBC WITH DIFFERENTIAL/PLATELET
Abs Immature Granulocytes: 0.01 10*3/uL (ref 0.00–0.07)
Basophils Absolute: 0.1 10*3/uL (ref 0.0–0.1)
Basophils Absolute: 0.1 10*3/uL (ref 0.0–0.1)
Basophils Relative: 1 %
Basos: 1 %
EOS (ABSOLUTE): 0.1 10*3/uL (ref 0.0–0.5)
Eos: 1 %
Eosinophils Absolute: 0.1 10*3/uL (ref 0.0–0.5)
Eosinophils Relative: 1 %
HCT: 43 % (ref 36.0–46.0)
Hematocrit: 43 % (ref 36.0–46.0)
Hemoglobin: 14.1 g/dL (ref 12.0–15.0)
Hemoglobin: 14.1 g/dL (ref 12.0–15.0)
Immature Grans (Abs): 0.01 10*3/uL (ref 0.00–0.07)
Immature Granulocytes: 0 %
Lymphocytes Absolute: 1.9 10*3/uL (ref 0.7–4.0)
Lymphocytes Relative: 29 %
Lymphs Abs: 1.9 10*3/uL (ref 0.7–4.0)
Lymphs: 29 %
MCH: 30.5 pg (ref 26.0–34.0)
MCH: 30.5 pg (ref 26.0–34.0)
MCHC: 32.8 g/dL (ref 30.0–36.0)
MCHC: 32.8 g/dL (ref 30.0–36.0)
MCV: 93.1 fL (ref 80.0–100.0)
MCV: 93.1 fL (ref 80.0–100.0)
Monocytes Absolute: 0.5 10*3/uL (ref 0.1–1.0)
Monocytes Absolute: 0.5 10*3/uL (ref 0.1–1.0)
Monocytes Relative: 7 %
Monocytes: 7 %
Neutro Abs: 4.1 10*3/uL (ref 1.7–7.7)
Neutrophils Absolute: 4.1 10*3/uL (ref 1.7–7.7)
Neutrophils Relative %: 62 %
Neutrophils: 62 %
Platelets: 328 10*3/uL (ref 150–400)
Platelets: 328 10*3/uL (ref 150–400)
RBC: 4.62 MIL/uL (ref 3.87–5.11)
RBC: 4.62 MIL/uL (ref 3.87–5.11)
RDW: 12.8 % (ref 11.5–15.5)
RDW: 12.8 % (ref 11.5–15.5)
WBC: 6.6 10*3/uL (ref 4.0–10.5)
WBC: 6.6 10*3/uL (ref 4.0–10.5)
nRBC: 0 % (ref 0.0–0.2)

## 2020-01-25 LAB — BASIC METABOLIC PANEL
Anion gap: 11 (ref 5–15)
BUN: 7 mg/dL (ref 6–20)
BUN: 7 mg/dL (ref 6–20)
CO2: 23 mmol/L (ref 22–32)
CO2: 23 mmol/L (ref 22–32)
Calcium: 9.2 mg/dL (ref 8.9–10.3)
Calcium: 9.2 mg/dL (ref 8.9–10.3)
Chloride: 103 mmol/L (ref 98–111)
Chloride: 103 mmol/L (ref 98–111)
Creatinine, Ser: 1 mg/dL (ref 0.44–1.00)
Creatinine, Ser: 1 mg/dL (ref 0.44–1.00)
GFR calc Af Amer: >60 mL/min (ref >60)
GFR calc Af Amer: >60 mL/min (ref >60)
GFR calc non Af Amer: >60 mL/min (ref >60)
GFR calc non Af Amer: >60 mL/min (ref >60)
Glucose, Bld: 248 mg/dL — ABNORMAL HIGH (ref 70–99)
Glucose: 248 mg/dL — ABNORMAL HIGH (ref 70–99)
Potassium: 4 mmol/L (ref 3.5–5.1)
Potassium: 4 mmol/L (ref 3.5–5.1)
Sodium: 137 mmol/L (ref 135–145)
Sodium: 137 mmol/L (ref 135–145)

## 2020-01-25 LAB — POCT URINALYSIS DIP (MANUAL ENTRY)
Bilirubin, UA: NEGATIVE
Blood, UA: NEGATIVE
Glucose, UA: >=1000 mg/dL — AB
Leukocytes, UA: NEGATIVE
Nitrite, UA: NEGATIVE
Protein Ur, POC: 30 mg/dL — AB
Spec Grav, UA: 1.025 (ref 1.010–1.025)
Urobilinogen, UA: 0.2 E.U./dL
pH, UA: 6 (ref 5.0–8.0)

## 2020-01-25 LAB — POCT FASTING CBG KUC MANUAL ENTRY: POCT Glucose (KUC): 294 mg/dL — AB (ref 70–99)

## 2020-01-25 MED ORDER — DICYCLOMINE HCL 20 MG PO TABS
20.0000 mg | ORAL_TABLET | Freq: Two times a day (BID) | ORAL | 0 refills | Status: DC | PRN
Start: 2020-01-25 — End: 2020-02-27

## 2020-01-25 NOTE — Discharge Instructions (Signed)
Urine shows some ketones. We drew CBC/BMP for further evaluation. At this time, you can use bentyl as needed to help with diarrhea/abdominal pain. Keep hydrated, urine should be clear to pale yellow in color. Bland diet, advance as tolerated. If worsening symptoms with worsening abdominal pain, increasing blood sugar, weakness, passing out, nausea/vomiting, go to the emergency department for further evaluation.

## 2020-01-25 NOTE — ED Triage Notes (Signed)
Patient presents for evaluation of elevated blood glucose (288 this morning). She was started on Januvia x one month ago.  She states felt poorly yesterday with dizziness and abdominal discomfort. She notes diarrhea, and denies nausea/vomiting.

## 2020-01-25 NOTE — ED Provider Notes (Signed)
[facility]    CSN: [id number] Arrival date & time: 01/25/20  0851      History   Chief Complaint Chief Complaint  Patient presents with  . Hyperglycemia  . Abdominal Pain    HPI Sandra Moody is a 49 y.o. female.   49 year old female comes in for lethargy, LLQ pain, diarrhea, hyperglycemia x 2 days. Having 4-6 episodes of diarrhea. No nausea/vomiting. No melena, hematochezia.denies fever, uri symptoms. Has urinary frequency without dysuria, hematuria. Denies vaginal symptoms. Patient was initically off DM meds due to side effects of PO meds. Noticed increasing fasting CBG ranging 200-300s, and was started on januvia 1 month ago. She was increased to 71m 2 weeks ago, fasting CBG now ranging high 100s-low 200s. This morning fasting was 190s.      Past Medical History:  Diagnosis Date  . Acute renal disease   . Asthma   . Bilateral carpal tunnel syndrome 03/11/2019  . Common migraine with intractable migraine 08/29/2018  . Daily headache    (03/08/2017)  . Essential hypertension 02/20/2019  . GERD (gastroesophageal reflux disease)   . H pylori ulcer ~ 08/2016  . History of hiatal hernia   . Leaky heart valve ~ 09/2016  . Migraine    "maybe weekly" (03/08/2017)  . PONV (postoperative nausea and vomiting)   . Type 2 diabetes mellitus with hyperglycemia, without long-term current use of insulin ([facility] 02/20/2019    Patient Active Problem List   Diagnosis Date Noted  . Type 2 diabetes mellitus without complication, without long-term current use of insulin ([facility] 09/05/2019  . Trigger finger of left thumb 04/30/2019  . Infection of flexor tendon sheath 04/15/2019  . Carpal tunnel syndrome, bilateral 03/11/2019  . Essential hypertension 02/20/2019  . Type 2 diabetes mellitus with hyperglycemia, without long-term current use of insulin ([facility] 02/20/2019  . Other acute sinusitis 12/25/2018  . Irritable bowel syndrome 12/04/2018  . Migraine with aura 12/04/2018  . Common  migraine with intractable migraine 08/29/2018  . Moderate persistent asthma without complication 040/81/4481 . Seasonal and perennial allergic rhinitis 06/28/2017  . Mild persistent asthma with acute exacerbation 06/28/2017  . Gastroesophageal reflux disease 06/28/2017  . Biliary dyskinesia 03/08/2017  . Asthma 03/02/2016  . Asthma with acute exacerbation 10/04/2015  . Other and unspecified ovarian cyst 06/17/2012    Past Surgical History:  Procedure Laterality Date  . CARPAL TUNNEL RELEASE Left 12/18/2019   Procedure: CARPAL TUNNEL RELEASE;  Surgeon: KLeanora Cover MD;  [facility]  Service: Orthopedics;  Laterality: Left;  . CHOLECYSTECTOMY N/A 03/08/2017   Procedure: LAPAROSCOPIC CHOLECYSTECTOMY;  Surgeon: BCoralie Keens MD;  [facility]  Service: General;  Laterality: N/A;  . DILATION AND CURETTAGE OF UTERUS    . LAPAROSCOPIC CHOLECYSTECTOMY  03/08/2017  . TRIGGER FINGER RELEASE Left 12/18/2019   Procedure: RELEASE TRIGGER FINGER/A-1 PULLEY;  Surgeon: KLeanora Cover MD;  [facility]  Service: Orthopedics;  Laterality: Left;  . TUBAL LIGATION      OB History    Gravida  5   Para  3   Term  3   Preterm  0   AB  2   Living        SAB  1   TAB  1   Ectopic  0   Multiple      Live Births               Home Medications  Prior to Admission medications   Medication Sig Start Date End Date Taking? Authorizing Provider  ACCU-CHEK GUIDE test strip USE TO CHECK GLUCOSE TWICE DAILY,FASTING AND 2 HOURS AFTER DINNER 07/15/19   Rutherford Guys, MD  albuterol (PROVENTIL) (2.5 MG/3ML) 0.083% nebulizer solution Take 3 mLs (2.5 mg total) by nebulization every 6 (six) hours as needed for wheezing or shortness of breath. 11/13/19   Wendie Agreste, MD  ALBUTEROL SULFATE PO Take by mouth.    [provider]  amLODipine (NORVASC) 2.5 MG tablet TAKE 1 TABLET BY MOUTH EVERY DAY 11/02/19   Rutherford Guys, MD    BIOTIN PO Take by mouth.    [provider]  blood glucose meter kit and supplies Per insurance preference. Use to check glucose twice a day (fasting and 2 hours after dinner)  Dx E11.65 10/30/18   Rutherford Guys, MD  Cholecalciferol (VITAMIN D3 PO) Take by mouth.    [provider]  Cyanocobalamin (VITAMIN B-12 PO) Take by mouth.    [provider]  dicyclomine (BENTYL) 20 MG tablet Take 1 tablet (20 mg total) by mouth 2 (two) times daily as needed for spasms. 01/25/20   Tasia Catchings, Hermie Reagor V, PA-C  EPIPEN 2-PAK 0.3 MG/0.3ML SOAJ injection Inject 0.3 mg into the muscle as needed (for allergic reaction).  05/18/16   [provider]  fluticasone (FLONASE) 50 MCG/ACT nasal spray USE 1 2 SPRAYS IN EACH NOSTRIL ONCE A DAY 10/25/18   [provider]  Galcanezumab-gnlm (EMGALITY) 120 MG/ML SOAJ Inject 240 mg into the skin every 30 (thirty) days. 10/21/19   Suzzanne Cloud, NP  Galcanezumab-gnlm (EMGALITY) 120 MG/ML SOAJ Inject 120 mg into the skin every 30 (thirty) days. 10/21/19   Suzzanne Cloud, NP  ketoconazole (NIZORAL) 2 % shampoo Apply 1 application topically 2 (two) times a week. Apply to affected area lather, leave in place for 5 mins and then rinse off with water 01/15/20   Rutherford Guys, MD  levocetirizine (XYZAL) 5 MG tablet 1 2 TABLET IN THE EVENING ONCE A DAY ORALLY 15 11/05/18   [provider]  medroxyPROGESTERone (DEPO-PROVERA) 150 MG/ML injection  08/15/18   [provider]  montelukast (SINGULAIR) 10 MG tablet Take 10 mg by mouth at bedtime.    [provider]  olopatadine (PATANOL) 0.1 % ophthalmic solution Place 1 drop into both eyes 2 (two) times daily. 12/25/18   Corum, Rex Kras, MD  pregabalin (LYRICA) 100 MG capsule Take 100 mg by mouth 3 (three) times daily. 10/09/19   [provider]  rizatriptan (MAXALT) 10 MG tablet TAKE 1 TABLET (10 MG TOTAL) BY MOUTH 3 (THREE) TIMES DAILY AS NEEDED FOR MIGRAINE. 06/02/19   Kathrynn Ducking, MD  sitaGLIPtin (JANUVIA) 50 MG tablet Take 1 tablet (50 mg total) by mouth daily. 01/09/20   Shamleffer, Melanie Crazier, MD  SYMBICORT 160-4.5 MCG/ACT inhaler INHALE 2 PUFFS INTO THE LUNGS DAILY. 05/09/18   Valentina Shaggy, MD  VITAMIN E PO Take by mouth.    [provider]    Family History Family History  Problem Relation Age of Onset  . Thyroid disease Mother   . Diabetes Maternal Grandmother   . Heart disease Maternal Grandfather     Social History Social History   Tobacco Use  . Smoking status: Never Smoker  . Smokeless tobacco: Never Used  Vaping Use  . Vaping Use: Never used  Substance Use Topics  . Alcohol use: Not  Currently    Comment: 03/08/2017 "might have a drink on a holiday"  . Drug use: No     Allergies   Ginger, Passion fruit flavor [flavoring agent], Shellfish allergy, Other, and Tamiflu [oseltamivir phosphate]   Review of Systems Review of Systems  Reason unable to perform ROS: See HPI as above.     Physical Exam Triage Vital Signs ED Triage Vitals  Enc Vitals Group     BP 01/25/20 0904 118/82     Pulse Rate 01/25/20 0904 88     Resp 01/25/20 0904 14     Temp 01/25/20 0904 98.7 F (37.1 C)     Temp Source 01/25/20 0904 Oral     SpO2 01/25/20 0904 100 %     Weight --      Height --      Head Circumference --      Peak Flow --      Pain Score 01/25/20 0907 5     Pain Loc --      Pain Edu? --      Excl. in [place]? --    No data found.  Updated Vital Signs BP 118/82 (BP Location: Left Arm)   Pulse 88   Temp 98.7 F (37.1 C) (Oral)   Resp 14   SpO2 100%   Physical Exam Constitutional:      General: She is not in acute distress.    Appearance: She is well-developed. She is not ill-appearing, toxic-appearing or diaphoretic.  HENT:     Head: Normocephalic and atraumatic.  Eyes:     Conjunctiva/sclera: Conjunctivae normal.     Pupils: Pupils are equal, round, and reactive to light.  Cardiovascular:     Rate  and Rhythm: Normal rate and regular rhythm.  Pulmonary:     Effort: Pulmonary effort is normal. No respiratory distress.     Comments: LCTAB Abdominal:     General: Bowel sounds are normal.     Palpations: Abdomen is soft.     Tenderness: There is no abdominal tenderness. There is no right CVA tenderness, left CVA tenderness, guarding or rebound.  Musculoskeletal:     Cervical back: Normal range of motion and neck supple.  Skin:    General: Skin is warm and dry.  Neurological:     Mental Status: She is alert and oriented to person, place, and time.  Psychiatric:        Behavior: Behavior normal.        Judgment: Judgment normal.      UC Treatments / Results  Labs (all labs ordered are listed, but only abnormal results are displayed) Labs Reviewed  POCT FASTING CBG [facility] MANUAL ENTRY - Abnormal; Notable for the following components:      Result Value   POCT Glucose ([facility]) 294 (*)    All other components within normal limits  POCT URINALYSIS DIP (MANUAL ENTRY) - Abnormal; Notable for the following components:   Glucose, UA >=1,000 (*)    Ketones, POC UA trace (5) (*)    Protein Ur, POC =30 (*)    All other components within normal limits  CBC WITH DIFFERENTIAL/PLATELET  BASIC METABOLIC PANEL    EKG   Radiology No results found.  Procedures Procedures (including critical care time)  Medications Ordered in UC Medications - No data to display  Initial Impression / Assessment and Plan / UC Course  I have reviewed the triage vital signs and the nursing notes.  Pertinent labs & imaging results that  were available during my care of the patient were reviewed by me and considered in my medical decision making (see chart for details).    Trace ketones in urine, will draw CBC/BMP for further evaluation. Current CBG 294 post breakfast. Will provide bentyl for symptomatic management. Push fluids. Strict return precautions given. Otherwise will adjust plan with lab results.  Patient discharged in stable condition pending lab results.  Final Clinical Impressions(s) / UC Diagnoses   Final diagnoses:  Hyperglycemia  Fatigue, unspecified type  LLQ pain  Diarrhea, unspecified type    ED Prescriptions    Medication Sig Dispense Auth. Provider   dicyclomine (BENTYL) 20 MG tablet Take 1 tablet (20 mg total) by mouth 2 (two) times daily as needed for spasms. 10 tablet Ok Edwards, PA-C     PDMP not reviewed this encounter.   Ok Edwards, PA-C 01/25/20 [id number]

## 2020-01-26 ENCOUNTER — Telehealth (HOSPITAL_COMMUNITY): Payer: Self-pay | Admitting: Orthopedic Surgery

## 2020-01-31 ENCOUNTER — Encounter: Payer: Self-pay | Admitting: Emergency Medicine

## 2020-01-31 ENCOUNTER — Ambulatory Visit
Admission: EM | Admit: 2020-01-31 | Discharge: 2020-01-31 | Disposition: A | Discharge status: Home / Self Care | Payer: Managed Care, Other (non HMO) | Attending: Emergency Medicine | Admitting: Emergency Medicine

## 2020-01-31 ENCOUNTER — Other Ambulatory Visit: Payer: Self-pay

## 2020-01-31 DIAGNOSIS — L282 Other prurigo: Secondary | ICD-10-CM

## 2020-01-31 MED ORDER — METHYLPREDNISOLONE SODIUM SUCC 125 MG IJ SOLR
125.0000 mg | Freq: Once | INTRAMUSCULAR | Status: AC
  Administered 2020-01-31: 125 mg via INTRAMUSCULAR

## 2020-01-31 NOTE — ED Notes (Signed)
Patient able to ambulate independently  

## 2020-01-31 NOTE — Discharge Instructions (Signed)
You were given steroid shot today. May take Benadryl at bedtime, Zyrtec during the day. Go to ER for any difficulty breathing, swallowing, swelling of lips, tongue, throat, chest pain.

## 2020-01-31 NOTE — ED Triage Notes (Signed)
Patient presents to [facility] for assessment of rash she noted on waking up around 715am.  Patient states it is itchy and stings a little.  States she has been taking Bentyl the past week, but did not have a dose this morning.  Denies changes in products for bathing.

## 2020-01-31 NOTE — ED Provider Notes (Signed)
[facility]    CSN: [id number] Arrival date & time: 01/31/20  5003      History   Chief Complaint Chief Complaint  Patient presents with  . Rash    HPI Sandra Moody is a 49 y.o. female with history of migraines, asthma, hypertension presenting for pruritic rash upon waking around 7 AM today.  States she was able to see the rash better at home: Dressing pruritus, stinging sensation on bilateral arms, chest.  No swelling of lips, tongue, throat.  No chest pain, shortness of breath, nausea, vomiting, diarrhea.  No change in medications, diet/lifestyle, topical products.  Has not tried thing for this.   Past Medical History:  Diagnosis Date  . Acute renal disease   . Asthma   . Bilateral carpal tunnel syndrome 03/11/2019  . Common migraine with intractable migraine 08/29/2018  . Daily headache    (03/08/2017)  . Essential hypertension 02/20/2019  . GERD (gastroesophageal reflux disease)   . H pylori ulcer ~ 08/2016  . History of hiatal hernia   . Leaky heart valve ~ 09/2016  . Migraine    "maybe weekly" (03/08/2017)  . PONV (postoperative nausea and vomiting)   . Type 2 diabetes mellitus with hyperglycemia, without long-term current use of insulin ([place]) 02/20/2019    Patient Active Problem List   Diagnosis Date Noted  . Type 2 diabetes mellitus without complication, without long-term current use of insulin ([place]) 09/05/2019  . Trigger finger of left thumb 04/30/2019  . Infection of flexor tendon sheath 04/15/2019  . Carpal tunnel syndrome, bilateral 03/11/2019  . Essential hypertension 02/20/2019  . Type 2 diabetes mellitus with hyperglycemia, without long-term current use of insulin ([place]) 02/20/2019  . Other acute sinusitis 12/25/2018  . Irritable bowel syndrome 12/04/2018  . Migraine with aura 12/04/2018  . Common migraine with intractable migraine 08/29/2018  . Moderate persistent asthma without complication 70/48/8891  . Seasonal and perennial allergic rhinitis  06/28/2017  . Mild persistent asthma with acute exacerbation 06/28/2017  . Gastroesophageal reflux disease 06/28/2017  . Biliary dyskinesia 03/08/2017  . Asthma 03/02/2016  . Asthma with acute exacerbation 10/04/2015  . Other and unspecified ovarian cyst 06/17/2012    Past Surgical History:  Procedure Laterality Date  . CARPAL TUNNEL RELEASE Left 12/18/2019   Procedure: CARPAL TUNNEL RELEASE;  Surgeon: [name], MD;  Location: [place];  Service: Orthopedics;  Laterality: Left;  . CHOLECYSTECTOMY N/A 03/08/2017   Procedure: LAPAROSCOPIC CHOLECYSTECTOMY;  Surgeon: Coralie Keens, MD;  Location: [place];  Service: General;  Laterality: N/A;  . DILATION AND CURETTAGE OF UTERUS    . LAPAROSCOPIC CHOLECYSTECTOMY  03/08/2017  . TRIGGER FINGER RELEASE Left 12/18/2019   Procedure: RELEASE TRIGGER FINGER/A-1 PULLEY;  Surgeon: [name], MD;  Location: [place];  Service: Orthopedics;  Laterality: Left;  . TUBAL LIGATION      OB History    Gravida  5   Para  3   Term  3   Preterm  0   AB  2   Living        SAB  1   TAB  1   Ectopic  0   Multiple      Live Births               Home Medications    Prior to Admission medications   Medication Sig Start Date End Date Taking? Authorizing Provider  ACCU-CHEK GUIDE test strip USE TO CHECK GLUCOSE TWICE  DAILY,FASTING AND 2 HOURS AFTER DINNER 07/15/19   Rutherford Guys, MD  albuterol (PROVENTIL) (2.5 MG/3ML) 0.083% nebulizer solution Take 3 mLs (2.5 mg total) by nebulization every 6 (six) hours as needed for wheezing or shortness of breath. 11/13/19   Wendie Agreste, MD  ALBUTEROL SULFATE PO Take by mouth.    [provider]  amLODipine (NORVASC) 2.5 MG tablet TAKE 1 TABLET BY MOUTH EVERY DAY 11/02/19   Rutherford Guys, MD  BIOTIN PO Take by mouth.    [provider]  blood glucose meter kit and supplies Per insurance preference. Use to check glucose twice a day  (fasting and 2 hours after dinner)  Dx E11.65 10/30/18   Rutherford Guys, MD  Cholecalciferol (VITAMIN D3 PO) Take by mouth.    [provider]  Cyanocobalamin (VITAMIN B-12 PO) Take by mouth.    [provider]  dicyclomine (BENTYL) 20 MG tablet Take 1 tablet (20 mg total) by mouth 2 (two) times daily as needed for spasms. 01/25/20   Tasia Catchings, Amy V, PA-C  EPIPEN 2-PAK 0.3 MG/0.3ML SOAJ injection Inject 0.3 mg into the muscle as needed (for allergic reaction).  05/18/16   [provider]  fluticasone (FLONASE) 50 MCG/ACT nasal spray USE 1 2 SPRAYS IN EACH NOSTRIL ONCE A DAY 10/25/18   [provider]  Galcanezumab-gnlm (EMGALITY) 120 MG/ML SOAJ Inject 240 mg into the skin every 30 (thirty) days. 10/21/19   Suzzanne Cloud, NP  Galcanezumab-gnlm (EMGALITY) 120 MG/ML SOAJ Inject 120 mg into the skin every 30 (thirty) days. 10/21/19   Suzzanne Cloud, NP  ketoconazole (NIZORAL) 2 % shampoo Apply 1 application topically 2 (two) times a week. Apply to affected area lather, leave in place for 5 mins and then rinse off with water 01/15/20   Rutherford Guys, MD  levocetirizine (XYZAL) 5 MG tablet 1 2 TABLET IN THE EVENING ONCE A DAY ORALLY 15 11/05/18   [provider]  medroxyPROGESTERone (DEPO-PROVERA) 150 MG/ML injection  08/15/18   [provider]  montelukast (SINGULAIR) 10 MG tablet Take 10 mg by mouth at bedtime.    [provider]  olopatadine (PATANOL) 0.1 % ophthalmic solution Place 1 drop into both eyes 2 (two) times daily. 12/25/18   Corum, Rex Kras, MD  pregabalin (LYRICA) 100 MG capsule Take 100 mg by mouth 3 (three) times daily. 10/09/19   [provider]  rizatriptan (MAXALT) 10 MG tablet TAKE 1 TABLET (10 MG TOTAL) BY MOUTH 3 (THREE) TIMES DAILY AS NEEDED FOR MIGRAINE. 06/02/19   Kathrynn Ducking, MD  sitaGLIPtin (JANUVIA) 50 MG tablet Take 1 tablet (50 mg total) by mouth daily. 01/09/20   Shamleffer, Melanie Crazier, MD  SYMBICORT 160-4.5  MCG/ACT inhaler INHALE 2 PUFFS INTO THE LUNGS DAILY. 05/09/18   Valentina Shaggy, MD  VITAMIN E PO Take by mouth.    [provider]    Family History Family History  Problem Relation Age of Onset  . Thyroid disease Mother   . Diabetes Maternal Grandmother   . Heart disease Maternal Grandfather     Social History Social History   Tobacco Use  . Smoking status: Never Smoker  . Smokeless tobacco: Never Used  Vaping Use  . Vaping Use: Never used  Substance Use Topics  . Alcohol use: Not Currently    Comment: 03/08/2017 "might have a drink on a holiday"  . Drug use: No     Allergies   Ginger,  Passion fruit flavor [flavoring agent], Shellfish allergy, Other, and Tamiflu [oseltamivir phosphate]   Review of Systems As per HPI   Physical Exam Triage Vital Signs ED Triage Vitals  Enc Vitals Group     BP      Pulse      Resp      Temp      Temp src      SpO2      Weight      Height      Head Circumference      Peak Flow      Pain Score      Pain Loc      Pain Edu?      Excl. in [place]?    No data found.  Updated Vital Signs BP 124/81 (BP Location: Left Arm)   Pulse 88   Temp 98.9 F (37.2 C) (Oral)   Resp 18   SpO2 98%   Visual Acuity Right Eye Distance:   Left Eye Distance:   Bilateral Distance:    Right Eye Near:   Left Eye Near:    Bilateral Near:     Physical Exam Constitutional:      General: She is not in acute distress. HENT:     Head: Normocephalic and atraumatic.     Mouth/Throat:     Mouth: Mucous membranes are moist.     Pharynx: Oropharynx is clear.  Eyes:     General: No scleral icterus.    Pupils: Pupils are equal, round, and reactive to light.  Cardiovascular:     Rate and Rhythm: Normal rate.  Pulmonary:     Effort: Pulmonary effort is normal.  Musculoskeletal:     Cervical back: Normal range of motion and neck supple. No rigidity.  Skin:    Capillary Refill: Capillary refill takes less than 2 seconds.      Coloration: Skin is not jaundiced or pale.     Comments: Very fine erythematous papules to bilateral upper extremities, predominantly near ACs.  None on chest, neck.  No warmth, streaking, open wound or discharge.  No TTP  Neurological:     General: No focal deficit present.     Mental Status: She is alert and oriented to person, place, and time.      UC Treatments / Results  Labs (all labs ordered are listed, but only abnormal results are displayed) Labs Reviewed - No data to display  EKG   Radiology No results found.  Procedures Procedures (including critical care time)  Medications Ordered in UC Medications  methylPREDNISolone sodium succinate (SOLU-MEDROL) 125 mg/2 mL injection 125 mg (has no administration in time range)    Initial Impression / Assessment and Plan / UC Course  I have reviewed the triage vital signs and the nursing notes.  Pertinent labs & imaging results that were available during my care of the patient were reviewed by me and considered in my medical decision making (see chart for details).     Patient afebrile, nontoxic, hemodynamically stable without airway compromise.  Patient compliant with Depo-injections, not currently sexually active.  Given Solu-Medrol office which she tolerated well.  Will treat supportively as outlined below.  Return precautions discussed, patient verbalized understanding and is agreeable to plan. Final Clinical Impressions(s) / UC Diagnoses   Final diagnoses:  Pruritic rash     Discharge Instructions     You were given steroid shot today. May take Benadryl at bedtime, Zyrtec during the day. Go to ER for  any difficulty breathing, swallowing, swelling of lips, tongue, throat, chest pain.    ED Prescriptions    None     PDMP not reviewed this encounter.   [place], [place], [place] 01/31/20 [phone]

## 2020-02-13 ENCOUNTER — Other Ambulatory Visit: Payer: Self-pay

## 2020-02-13 ENCOUNTER — Ambulatory Visit (INDEPENDENT_AMBULATORY_CARE_PROVIDER_SITE_OTHER): Payer: Managed Care, Other (non HMO) | Admitting: Family Medicine

## 2020-02-13 ENCOUNTER — Encounter: Payer: Self-pay | Admitting: Family Medicine

## 2020-02-13 VITALS — BP 124/80 | HR 85 | Temp 97.8°F | Resp 14 | Ht 64.0 in | Wt 162.0 lb

## 2020-02-13 DIAGNOSIS — E1165 Type 2 diabetes mellitus with hyperglycemia: Secondary | ICD-10-CM | POA: Diagnosis not present

## 2020-02-13 LAB — POCT URINALYSIS DIP (MANUAL ENTRY)
Bilirubin, UA: NEGATIVE
Blood, UA: NEGATIVE
Glucose, UA: >=1000 mg/dL — AB
Ketones, POC UA: NEGATIVE mg/dL
Leukocytes, UA: NEGATIVE
Nitrite, UA: NEGATIVE
Protein Ur, POC: NEGATIVE mg/dL
Spec Grav, UA: 1.025 (ref 1.010–1.025)
Urobilinogen, UA: 0.2 E.U./dL
pH, UA: 6 (ref 5.0–8.0)

## 2020-02-13 MED ORDER — LANTUS SOLOSTAR 100 UNIT/ML ~~LOC~~ SOPN: 30.0000 [IU] | PEN_INJECTOR | Freq: Every day | SUBCUTANEOUS | 2 refills | Status: DC

## 2020-02-13 MED ORDER — PEN NEEDLES 32G X 6 MM MISC: 2 refills | Status: DC

## 2020-02-13 NOTE — Patient Instructions (Addendum)
Start lantus 10 units at bedtime Increase by 2 units every 4 days Max dose per injection is 50 units  Fasting blood glucose goal between 90-130    If you have lab work done today you will be contacted with your lab results within the next 2 weeks.  If you have not heard from [place] then please contact us. The fastest way to get your results is to register for My Chart.   IF you received an x-ray today, you will receive an invoice from [facility] Rad[facility]. Please contact [facility] Radiology at [phone] with questions or concerns regarding your invoice.   IF you received labwork today, you will receive an invoice from [place]. Please contact [facility] at [phone] with questions or concerns regarding your invoice.   Our billing staff will not be able to assist you with questions regarding bills from these companies.  You will be contacted with the lab results as soon as they are available. The fastest way to get your results is to activate your My Chart account. Instructions are located on the last page of this paperwork. If you have not heard from [place] regarding the results in 2 weeks, please contact this office.

## 2020-02-13 NOTE — Progress Notes (Signed)
7/16/20213:42 PM  Sandra Moody 11-28-1970, 49 y.o., female [id number]  Chief Complaint  Patient presents with  . Diabetes    pt visited UC 4 weeks ago due to high glucose and stomach pains pt reports this has not improved and she is still having stomach pain, notes her vison has been blurry, and she increased januvia to 100 mg 2 weeks ago and still has not seen an improvment in surags today at about noon was 333 withour a meal, 229 this am when pt woke up. seems to jump to 400 at night time pt has also been having heart burn frequently as well throat feels like it is burning from this     HPI:   Patient is a 49 y.o. female with past medical history significant for DM2,HTN, migraines, asthma, GERD, biliary dyskinesia,who presents today for concerns of hyperglycemia  Last OV Jun 2021  Increased januvia 131m about 2 weeks for cbgs 300s Went to UC as cbgs high and having intermittent stabbing lower abd pain, this started before increasing januvia No nausea or vomiting Having diarrhea x couple of weeks,  no blood in stool, not black or tarry stool she is bloated No vaginal discharge or pelvic pain Has endometriosis Fasting 200s cbgs today before lunch 333 Eating ok, increased urination but no dysuria Having blurry vision No sick contacts  Trial of glipizide and actos - hypoglycemia Metformin - GI issues Last endo appt June 2021  [facility] 02/13/2020 11/13/2019 10/10/2019  Decreased Interest 0 0 0  Down, Depressed, Hopeless 0 0 0  PHQ - 2 Score 0 0 0    Fall Risk  02/13/2020 01/05/2020 11/13/2019 10/10/2019 09/12/2019  Falls in the past year? 0 0 0 0 0  Number falls in past yr: 0 0 - 0 0  Injury with Fall? 0 0 - 0 0  Risk for fall due to : No Fall Risks - - - -  Follow up Falls evaluation completed - Falls evaluation completed Falls evaluation completed Falls evaluation completed     Allergies  Allergen Reactions  . Ginger Itching and Swelling  . Passion  Fruit Flavor [Flavoring Agent] Itching and Swelling  . Shellfish Allergy Anaphylaxis  . Other     ALLERGIC TO ALL TREE NUTS  . Tamiflu [Oseltamivir Phosphate]     Prior to Admission medications   Medication Sig Start Date End Date Taking? Authorizing Provider  ACCU-CHEK GUIDE test strip USE TO CHECK GLUCOSE TWICE DAILY,FASTING AND 2 HOURS AFTER DINNER 07/15/19  Yes SRutherford Guys MD  albuterol (PROVENTIL) (2.5 MG/3ML) 0.083% nebulizer solution Take 3 mLs (2.5 mg total) by nebulization every 6 (six) hours as needed for wheezing or shortness of breath. 11/13/19  Yes GWendie Agreste MD  ALBUTEROL SULFATE PO Take by mouth.   Yes [provider]  amLODipine (NORVASC) 2.5 MG tablet TAKE 1 TABLET BY MOUTH EVERY DAY 11/02/19  Yes SRutherford Guys MD  BIOTIN PO Take by mouth.   Yes [provider]  blood glucose meter kit and supplies Per insurance preference. Use to check glucose twice a day (fasting and 2 hours after dinner)  Dx E11.65 10/30/18  Yes SRutherford Guys MD  Cholecalciferol (VITAMIN D3 PO) Take by mouth.   Yes [provider]  Cyanocobalamin (VITAMIN B-12 PO) Take by mouth.   Yes [provider]  dicyclomine (BENTYL) 20 MG tablet Take 1 tablet (20 mg total) by mouth 2 (two) times daily as  needed for spasms. 01/25/20  Yes Yu, Amy V, PA-C  EPIPEN 2-PAK 0.3 MG/0.3ML SOAJ injection Inject 0.3 mg into the muscle as needed (for allergic reaction).  05/18/16  Yes [provider]  fluticasone (FLONASE) 50 MCG/ACT nasal spray USE 1 2 SPRAYS IN EACH NOSTRIL ONCE A DAY 10/25/18  Yes [provider]  Galcanezumab-gnlm (EMGALITY) 120 MG/ML SOAJ Inject 240 mg into the skin every 30 (thirty) days. 10/21/19  Yes Suzzanne Cloud, NP  Galcanezumab-gnlm (EMGALITY) 120 MG/ML SOAJ Inject 120 mg into the skin every 30 (thirty) days. 10/21/19  Yes Suzzanne Cloud, NP  ketoconazole (NIZORAL) 2 % shampoo Apply 1 application topically 2 (two) times a week. Apply  to affected area lather, leave in place for 5 mins and then rinse off with water 01/15/20  Yes Rutherford Guys, MD  levocetirizine (XYZAL) 5 MG tablet 1 2 TABLET IN THE EVENING ONCE A DAY ORALLY 15 11/05/18  Yes [provider]  medroxyPROGESTERone (DEPO-PROVERA) 150 MG/ML injection  08/15/18  Yes [provider]  montelukast (SINGULAIR) 10 MG tablet Take 10 mg by mouth at bedtime.   Yes [provider]  olopatadine (PATANOL) 0.1 % ophthalmic solution Place 1 drop into both eyes 2 (two) times daily. 12/25/18  Yes Corum, Rex Kras, MD  pregabalin (LYRICA) 100 MG capsule Take 100 mg by mouth 3 (three) times daily. 10/09/19  Yes [provider]  rizatriptan (MAXALT) 10 MG tablet TAKE 1 TABLET (10 MG TOTAL) BY MOUTH 3 (THREE) TIMES DAILY AS NEEDED FOR MIGRAINE. 06/02/19  Yes Kathrynn Ducking, MD  sitaGLIPtin (JANUVIA) 50 MG tablet Take 1 tablet (50 mg total) by mouth daily. 01/09/20  Yes Shamleffer, Melanie Crazier, MD  SYMBICORT 160-4.5 MCG/ACT inhaler INHALE 2 PUFFS INTO THE LUNGS DAILY. 05/09/18  Yes Valentina Shaggy, MD  VITAMIN E PO Take by mouth.   Yes [provider]    Past Medical History:  Diagnosis Date  . Acute renal disease   . Asthma   . Bilateral carpal tunnel syndrome 03/11/2019  . Common migraine with intractable migraine 08/29/2018  . Daily headache    (03/08/2017)  . Essential hypertension 02/20/2019  . GERD (gastroesophageal reflux disease)   . H pylori ulcer ~ 08/2016  . History of hiatal hernia   . Leaky heart valve ~ 09/2016  . Migraine    "maybe weekly" (03/08/2017)  . PONV (postoperative nausea and vomiting)   . Type 2 diabetes mellitus with hyperglycemia, without long-term current use of insulin ([place]) 02/20/2019    Past Surgical History:  Procedure Laterality Date  . CARPAL TUNNEL RELEASE Left 12/18/2019   Procedure: CARPAL TUNNEL RELEASE;  Surgeon: [name], MD;  Location: [place];  Service: Orthopedics;   Laterality: Left;  . CHOLECYSTECTOMY N/A 03/08/2017   Procedure: LAPAROSCOPIC CHOLECYSTECTOMY;  Surgeon: Coralie Keens, MD;  Location: [place];  Service: General;  Laterality: N/A;  . DILATION AND CURETTAGE OF UTERUS    . LAPAROSCOPIC CHOLECYSTECTOMY  03/08/2017  . TRIGGER FINGER RELEASE Left 12/18/2019   Procedure: RELEASE TRIGGER FINGER/A-1 PULLEY;  Surgeon: [name], MD;  Location: [place];  Service: Orthopedics;  Laterality: Left;  . TUBAL LIGATION      Social History   Tobacco Use  . Smoking status: Never Smoker  . Smokeless tobacco: Never Used  Substance Use Topics  . Alcohol use: Not Currently    Comment: 03/08/2017 "might have a drink on a holiday"    Family  History  Problem Relation Age of Onset  . Thyroid disease Mother   . Diabetes Maternal Grandmother   . Heart disease Maternal Grandfather     ROS Per hpi  OBJECTIVE:  Today's Vitals   02/13/20 1510  BP: 124/80  Pulse: 85  Resp: 14  Temp: 97.8 F (36.6 C)  TempSrc: Temporal  SpO2: 99%  Weight: 162 lb (73.5 kg)  Height: 5' 4"  (1.626 m)   Body mass index is 27.81 kg/m.   Wt Readings from Last 3 Encounters:  02/13/20 162 lb (73.5 kg)  01/13/20 159 lb (72.1 kg)  01/09/20 163 lb 6.4 oz (74.1 kg)     Physical Exam Vitals and nursing note reviewed.  Constitutional:      Appearance: She is well-developed.  HENT:     Head: Normocephalic and atraumatic.     Mouth/Throat:     Pharynx: No oropharyngeal exudate.  Eyes:     General: No scleral icterus.    Conjunctiva/sclera: Conjunctivae normal.     Pupils: Pupils are equal, round, and reactive to light.  Cardiovascular:     Rate and Rhythm: Normal rate and regular rhythm.     Heart sounds: Normal heart sounds. No murmur heard.  No friction rub. No gallop.   Pulmonary:     Effort: Pulmonary effort is normal.     Breath sounds: Normal breath sounds. No wheezing, rhonchi or rales.  Abdominal:     General: Bowel sounds are normal.  There is no distension.     Palpations: Abdomen is soft.     Tenderness: There is abdominal tenderness (generalized TTP). There is no guarding or rebound.  Musculoskeletal:     Cervical back: Neck supple.  Skin:    General: Skin is warm and dry.  Neurological:     Mental Status: She is alert and oriented to person, place, and time.     Results for orders placed or performed in visit on 02/13/20 (from the past 24 hour(s))  POCT urinalysis dipstick     Status: Abnormal   Collection Time: 02/13/20  3:21 PM  Result Value Ref Range   Color, UA yellow yellow   Clarity, UA clear clear   Glucose, UA >=1,000 (A) negative mg/dL   Bilirubin, UA negative negative   Ketones, POC UA negative negative mg/dL   Spec Grav, UA 1.025 1.010 - 1.025   Blood, UA negative negative   pH, UA 6.0 5.0 - 8.0   Protein Ur, POC negative negative mg/dL   Urobilinogen, UA 0.2 0.2 or 1.0 E.U./dL   Nitrite, UA Negative Negative   Leukocytes, UA Negative Negative    No results found.   ASSESSMENT and PLAN  1. Type 2 diabetes mellitus with hyperglycemia, without long-term current use of insulin ([facility]) Uncontrolled, discussed treatment options, starting lantus 10 units at bedtime, reviewed r/se/b, discussed titration - POCT urinalysis dipstick  Other orders - insulin glargine (LANTUS SOLOSTAR) 100 UNIT/ML Solostar Pen; Inject 30 Units into the skin at bedtime. - Insulin Pen Needle (PEN NEEDLES) 32G X 6 MM MISC; Use once a day with lantus injection  Return in about 2 weeks (around 02/27/2020).    Rutherford Guys, MD Primary [facility] at [place], [place] [address] Ph.  [phone] Fax [phone]

## 2020-02-20 ENCOUNTER — Ambulatory Visit (HOSPITAL_COMMUNITY)
Admission: EM | Admit: 2020-02-20 | Discharge: 2020-02-20 | Disposition: A | Discharge status: Home / Self Care | Payer: Managed Care, Other (non HMO) | Attending: Family Medicine | Admitting: Family Medicine

## 2020-02-20 ENCOUNTER — Other Ambulatory Visit: Payer: Self-pay

## 2020-02-20 ENCOUNTER — Encounter (HOSPITAL_COMMUNITY): Payer: Self-pay

## 2020-02-20 DIAGNOSIS — Z20822 Contact with and (suspected) exposure to covid-19: Secondary | ICD-10-CM | POA: Insufficient documentation

## 2020-02-20 LAB — SARS CORONAVIRUS 2 (TAT 6-24 HRS): SARS Coronavirus 2: NEGATIVE

## 2020-02-20 NOTE — ED Triage Notes (Signed)
Patient requesting to be covid tested. States she sits next to someone at work who has been hospitalized. States they had blue lips, chills, and cough. Patient denies any symptoms, but would like to be tested.

## 2020-02-20 NOTE — Discharge Instructions (Signed)
We will notify of you any positive findings or if any changes to treatment are needed. If normal or otherwise without concern to your results, we will not call you. Please log on to your MyChart to review your results if interested in so.   

## 2020-02-23 NOTE — ED Provider Notes (Signed)
[place]    CSN: [id number] Arrival date & time: 02/20/20  1605      History   Chief Complaint Chief Complaint  Patient presents with  . covid test    HPI Sandra Moody is a 49 y.o. female.   Sandra Moody presents with requests for covid-19 screening. There was someone at work who was ill and coughing. Unknown specifically this coworker had covid-19, but [name] is concerned about potential exposure. She is feeling well and without complaints today.     ROS per HPI, negative if not otherwise mentioned.      Past Medical History:  Diagnosis Date  . Acute renal disease   . Asthma   . Bilateral carpal tunnel syndrome 03/11/2019  . Common migraine with intractable migraine 08/29/2018  . Daily headache    (03/08/2017)  . Essential hypertension 02/20/2019  . GERD (gastroesophageal reflux disease)   . H pylori ulcer ~ 08/2016  . History of hiatal hernia   . Leaky heart valve ~ 09/2016  . Migraine    "maybe weekly" (03/08/2017)  . PONV (postoperative nausea and vomiting)   . Type 2 diabetes mellitus with hyperglycemia, without long-term current use of insulin ([name]) 02/20/2019    Patient Active Problem List   Diagnosis Date Noted  . Type 2 diabetes mellitus without complication, without long-term current use of insulin ([name]) 09/05/2019  . Trigger finger of left thumb 04/30/2019  . Infection of flexor tendon sheath 04/15/2019  . Carpal tunnel syndrome, bilateral 03/11/2019  . Essential hypertension 02/20/2019  . Type 2 diabetes mellitus with hyperglycemia, without long-term current use of insulin ([name]) 02/20/2019  . Other acute sinusitis 12/25/2018  . Irritable bowel syndrome 12/04/2018  . Migraine with aura 12/04/2018  . Common migraine with intractable migraine 08/29/2018  . Moderate persistent asthma without complication 78/29/5621  . Seasonal and perennial allergic rhinitis 06/28/2017  . Mild persistent asthma with acute exacerbation 06/28/2017  .  Gastroesophageal reflux disease 06/28/2017  . Biliary dyskinesia 03/08/2017  . Asthma 03/02/2016  . Asthma with acute exacerbation 10/04/2015  . Other and unspecified ovarian cyst 06/17/2012    Past Surgical History:  Procedure Laterality Date  . CARPAL TUNNEL RELEASE Left 12/18/2019   Procedure: CARPAL TUNNEL RELEASE;  Surgeon: [name], MD;  Location: [facility];  Service: Orthopedics;  Laterality: Left;  . CHOLECYSTECTOMY N/A 03/08/2017   Procedure: LAPAROSCOPIC CHOLECYSTECTOMY;  Surgeon: Coralie Keens, MD;  Location: [facility];  Service: General;  Laterality: N/A;  . DILATION AND CURETTAGE OF UTERUS    . LAPAROSCOPIC CHOLECYSTECTOMY  03/08/2017  . TRIGGER FINGER RELEASE Left 12/18/2019   Procedure: RELEASE TRIGGER FINGER/A-1 PULLEY;  Surgeon: [name], MD;  Location: [facility];  Service: Orthopedics;  Laterality: Left;  . TUBAL LIGATION      OB History    Gravida  5   Para  3   Term  3   Preterm  0   AB  2   Living        SAB  1   TAB  1   Ectopic  0   Multiple      Live Births               Home Medications    Prior to Admission medications   Medication Sig Start Date End Date Taking? Authorizing Provider  ACCU-CHEK GUIDE test strip USE TO CHECK GLUCOSE TWICE DAILY,FASTING AND 2 HOURS AFTER DINNER 07/15/19   Pamella Pert,  Lilia Argue, MD  albuterol (PROVENTIL) (2.5 MG/3ML) 0.083% nebulizer solution Take 3 mLs (2.5 mg total) by nebulization every 6 (six) hours as needed for wheezing or shortness of breath. 11/13/19   Wendie Agreste, MD  ALBUTEROL SULFATE PO Take by mouth.    [provider]  amLODipine (NORVASC) 2.5 MG tablet TAKE 1 TABLET BY MOUTH EVERY DAY 11/02/19   Rutherford Guys, MD  BIOTIN PO Take by mouth.    [provider]  blood glucose meter kit and supplies Per insurance preference. Use to check glucose twice a day (fasting and 2 hours after dinner)  Dx E11.65 10/30/18   Rutherford Guys, MD   Cholecalciferol (VITAMIN D3 PO) Take by mouth.    [provider]  Cyanocobalamin (VITAMIN B-12 PO) Take by mouth.    [provider]  dicyclomine (BENTYL) 20 MG tablet Take 1 tablet (20 mg total) by mouth 2 (two) times daily as needed for spasms. 01/25/20   Tasia Catchings, Amy V, PA-C  EPIPEN 2-PAK 0.3 MG/0.3ML SOAJ injection Inject 0.3 mg into the muscle as needed (for allergic reaction).  05/18/16   [provider]  fluticasone (FLONASE) 50 MCG/ACT nasal spray USE 1 2 SPRAYS IN EACH NOSTRIL ONCE A DAY 10/25/18   [provider]  Galcanezumab-gnlm (EMGALITY) 120 MG/ML SOAJ Inject 240 mg into the skin every 30 (thirty) days. 10/21/19   Suzzanne Cloud, NP  Galcanezumab-gnlm (EMGALITY) 120 MG/ML SOAJ Inject 120 mg into the skin every 30 (thirty) days. 10/21/19   Suzzanne Cloud, NP  insulin glargine (LANTUS SOLOSTAR) 100 UNIT/ML Solostar Pen Inject 30 Units into the skin at bedtime. 02/13/20   Rutherford Guys, MD  Insulin Pen Needle (PEN NEEDLES) 32G X 6 MM MISC Use once a day with lantus injection 02/13/20   Rutherford Guys, MD  ketoconazole (NIZORAL) 2 % shampoo Apply 1 application topically 2 (two) times a week. Apply to affected area lather, leave in place for 5 mins and then rinse off with water 01/15/20   Rutherford Guys, MD  levocetirizine (XYZAL) 5 MG tablet 1 2 TABLET IN THE EVENING ONCE A DAY ORALLY 15 11/05/18   [provider]  medroxyPROGESTERone (DEPO-PROVERA) 150 MG/ML injection  08/15/18   [provider]  montelukast (SINGULAIR) 10 MG tablet Take 10 mg by mouth at bedtime.    [provider]  olopatadine (PATANOL) 0.1 % ophthalmic solution Place 1 drop into both eyes 2 (two) times daily. 12/25/18   Corum, Rex Kras, MD  pregabalin (LYRICA) 100 MG capsule Take 100 mg by mouth 3 (three) times daily. 10/09/19   [provider]  rizatriptan (MAXALT) 10 MG tablet TAKE 1 TABLET (10 MG TOTAL) BY MOUTH 3 (THREE) TIMES DAILY AS NEEDED FOR  MIGRAINE. 06/02/19   Kathrynn Ducking, MD  sitaGLIPtin (JANUVIA) 50 MG tablet Take 1 tablet (50 mg total) by mouth daily. 01/09/20   Shamleffer, Melanie Crazier, MD  SYMBICORT 160-4.5 MCG/ACT inhaler INHALE 2 PUFFS INTO THE LUNGS DAILY. 05/09/18   Valentina Shaggy, MD  VITAMIN E PO Take by mouth.    [provider]    Family History Family History  Problem Relation Age of Onset  . Thyroid disease Mother   . Diabetes Maternal Grandmother   . Heart disease Maternal Grandfather     Social History Social History   Tobacco Use  . Smoking status: Never Smoker  . Smokeless tobacco: Never Used  Vaping Use  .  Vaping Use: Never used  Substance Use Topics  . Alcohol use: Not Currently    Comment: 03/08/2017 "might have a drink on a holiday"  . Drug use: No     Allergies   Ginger, Passion fruit flavor [flavoring agent], Shellfish allergy, Other, and Tamiflu [oseltamivir phosphate]   Review of Systems Review of Systems   Physical Exam Triage Vital Signs ED Triage Vitals  Enc Vitals Group     BP 02/20/20 1700 (!) 134/82     Pulse Rate 02/20/20 1700 95     Resp 02/20/20 1700 12     Temp 02/20/20 1700 98.6 F (37 C)     Temp src --      SpO2 02/20/20 1700 100 %     Weight --      Height --      Head Circumference --      Peak Flow --      Pain Score 02/20/20 1657 0     Pain Loc --      Pain Edu? --      Excl. in [place]? --    No data found.  Updated Vital Signs BP (!) 134/82   Pulse 95   Temp 98.6 F (37 C)   Resp 12   SpO2 100%   Visual Acuity Right Eye Distance:   Left Eye Distance:   Bilateral Distance:    Right Eye Near:   Left Eye Near:    Bilateral Near:     Physical Exam Constitutional:      General: She is not in acute distress.    Appearance: She is well-developed.  Cardiovascular:     Rate and Rhythm: Normal rate.  Pulmonary:     Effort: Pulmonary effort is normal.  Skin:    General: Skin is warm and dry.  Neurological:      Mental Status: She is alert and oriented to person, place, and time.      UC Treatments / Results  Labs (all labs ordered are listed, but only abnormal results are displayed) Labs Reviewed  SARS CORONAVIRUS 2 (TAT 6-24 HRS)    EKG   Radiology No results found.  Procedures Procedures (including critical care time)  Medications Ordered in UC Medications - No data to display  Initial Impression / Assessment and Plan / UC Course  I have reviewed the triage vital signs and the nursing notes.  Pertinent labs & imaging results that were available during my care of the patient were reviewed by me and considered in my medical decision making (see chart for details).     covid screening collected. Will notify of any positive results. covid education provided. Patient verbalized understanding and agreeable to plan.   Final Clinical Impressions(s) / UC Diagnoses   Final diagnoses:  Encounter for laboratory testing for COVID-19 virus     Discharge Instructions     We will notify of you any positive findings or if any changes to treatment are needed. If normal or otherwise without concern to your results, we will not call you. Please log on to your MyChart to review your results if interested in so.     ED Prescriptions    None     PDMP not reviewed this encounter.   Zigmund Gottron, NP 02/23/20 [phone]

## 2020-02-26 ENCOUNTER — Ambulatory Visit: Payer: Managed Care, Other (non HMO) | Admitting: Neurology

## 2020-02-27 ENCOUNTER — Other Ambulatory Visit: Payer: Self-pay

## 2020-02-27 ENCOUNTER — Encounter: Payer: Self-pay | Admitting: Family Medicine

## 2020-02-27 ENCOUNTER — Ambulatory Visit (INDEPENDENT_AMBULATORY_CARE_PROVIDER_SITE_OTHER): Payer: Managed Care, Other (non HMO) | Admitting: Family Medicine

## 2020-02-27 VITALS — BP 126/85 | HR 95 | Temp 98.0°F | Ht 64.0 in | Wt 163.2 lb

## 2020-02-27 DIAGNOSIS — E1165 Type 2 diabetes mellitus with hyperglycemia: Secondary | ICD-10-CM

## 2020-02-27 DIAGNOSIS — Z794 Long term (current) use of insulin: Secondary | ICD-10-CM | POA: Diagnosis not present

## 2020-02-27 MED ORDER — SITAGLIPTIN PHOSPHATE 100 MG PO TABS: 100.0000 mg | ORAL_TABLET | Freq: Every day | ORAL | 1 refills | Status: DC

## 2020-02-27 NOTE — Progress Notes (Signed)
7/30/20215:00 PM  APPHIA CROPLEY 11/28/1970, 49 y.o., female [id number]  Chief Complaint  Patient presents with  . Follow-up    x2 weeks on medical conditions. Pt stated that the medication gives her a funny feeling in her stomach every time she takes it.Marland Kitchen    HPI:   Patient is a 49 y.o. female with past medical history significant for DM2,HTN, migraines, asthma, GERD, biliary dyskinesia,who presents today forconcerns of hyperglycemia  Last OV 2 weeks ago - started lantus  Taking januvia 178m daily Has titrated latus to 16 units No hypoglycemia Gets nauseous if she does not eat in regular intervals Glucometer reading reviewed 7 day avg: 264 Checking fasting and at bedtime  meds tried: glipizide and actos - hypoglycemia Metformin - GI issues   Lab Results  Component Value Date   HGBA1C 8.4 (A) 01/05/2020   HGBA1C 6.1 (A) 08/07/2019   HGBA1C 6.7 (A) 02/20/2019   Lab Results  Component Value Date   LDLCALC 72 08/07/2019   CREATININE 1.00 01/25/2020   CREATININE 1.00 01/25/2020    Depression screen PHQ 2/9 02/27/2020 02/13/2020 11/13/2019  Decreased Interest 0 0 0  Down, Depressed, Hopeless 0 0 0  PHQ - 2 Score 0 0 0    Fall Risk  02/27/2020 02/13/2020 01/05/2020 11/13/2019 10/10/2019  Falls in the past year? 0 0 0 0 0  Number falls in past yr: 0 0 0 - 0  Injury with Fall? 0 0 0 - 0  Risk for fall due to : - No Fall Risks - - -  Follow up Falls evaluation completed Falls evaluation completed - Falls evaluation completed Falls evaluation completed     Allergies  Allergen Reactions  . Ginger Itching and Swelling  . Passion Fruit Flavor [Flavoring Agent] Itching and Swelling  . Shellfish Allergy Anaphylaxis  . Other     ALLERGIC TO ALL TREE NUTS  . Tamiflu [Oseltamivir Phosphate]     Prior to Admission medications   Medication Sig Start Date End Date Taking? Authorizing Provider  ACCU-CHEK GUIDE test strip USE TO CHECK GLUCOSE TWICE DAILY,FASTING AND 2 HOURS  AFTER DINNER 07/15/19  Yes SRutherford Guys MD  albuterol (PROVENTIL) (2.5 MG/3ML) 0.083% nebulizer solution Take 3 mLs (2.5 mg total) by nebulization every 6 (six) hours as needed for wheezing or shortness of breath. 11/13/19  Yes GWendie Agreste MD  ALBUTEROL SULFATE PO Take by mouth.   Yes [provider]  amLODipine (NORVASC) 2.5 MG tablet TAKE 1 TABLET BY MOUTH EVERY DAY 11/02/19  Yes SRutherford Guys MD  BIOTIN PO Take by mouth.   Yes [provider]  blood glucose meter kit and supplies Per insurance preference. Use to check glucose twice a day (fasting and 2 hours after dinner)  Dx E11.65 10/30/18  Yes SRutherford Guys MD  Cholecalciferol (VITAMIN D3 PO) Take by mouth.   Yes [provider]  Cyanocobalamin (VITAMIN B-12 PO) Take by mouth.   Yes [provider]  EPIPEN 2-PAK 0.3 MG/0.3ML SOAJ injection Inject 0.3 mg into the muscle as needed (for allergic reaction).  05/18/16  Yes [provider]  fluticasone (FLONASE) 50 MCG/ACT nasal spray USE 1 2 SPRAYS IN EACH NOSTRIL ONCE A DAY 10/25/18  Yes [provider]  Galcanezumab-gnlm (EMGALITY) 120 MG/ML SOAJ Inject 240 mg into the skin every 30 (thirty) days. 10/21/19  Yes SSuzzanne Cloud NP  Galcanezumab-gnlm (EMGALITY) 120 MG/ML SOAJ Inject 120 mg into the skin every 30 (  thirty) days. 10/21/19  Yes Suzzanne Cloud, NP  insulin glargine (LANTUS SOLOSTAR) 100 UNIT/ML Solostar Pen Inject 30 Units into the skin at bedtime. 02/13/20  Yes Rutherford Guys, MD  Insulin Pen Needle (PEN NEEDLES) 32G X 6 MM MISC Use once a day with lantus injection 02/13/20  Yes Rutherford Guys, MD  ketoconazole (NIZORAL) 2 % shampoo Apply 1 application topically 2 (two) times a week. Apply to affected area lather, leave in place for 5 mins and then rinse off with water 01/15/20  Yes Rutherford Guys, MD  levocetirizine (XYZAL) 5 MG tablet 1 2 TABLET IN THE EVENING ONCE A DAY ORALLY 15 11/05/18  Yes [provider]   medroxyPROGESTERone (DEPO-PROVERA) 150 MG/ML injection  08/15/18  Yes [provider]  montelukast (SINGULAIR) 10 MG tablet Take 10 mg by mouth at bedtime.   Yes [provider]  olopatadine (PATANOL) 0.1 % ophthalmic solution Place 1 drop into both eyes 2 (two) times daily. 12/25/18  Yes Corum, Rex Kras, MD  pregabalin (LYRICA) 100 MG capsule Take 100 mg by mouth 3 (three) times daily. 10/09/19  Yes [provider]  rizatriptan (MAXALT) 10 MG tablet TAKE 1 TABLET (10 MG TOTAL) BY MOUTH 3 (THREE) TIMES DAILY AS NEEDED FOR MIGRAINE. 06/02/19  Yes Kathrynn Ducking, MD  sitaGLIPtin (JANUVIA) 50 MG tablet Take 1 tablet (50 mg total) by mouth daily. 01/09/20  Yes Shamleffer, Melanie Crazier, MD  SYMBICORT 160-4.5 MCG/ACT inhaler INHALE 2 PUFFS INTO THE LUNGS DAILY. 05/09/18  Yes Valentina Shaggy, MD  VITAMIN E PO Take by mouth.   Yes [provider]    Past Medical History:  Diagnosis Date  . Acute renal disease   . Asthma   . Bilateral carpal tunnel syndrome 03/11/2019  . Common migraine with intractable migraine 08/29/2018  . Daily headache    (03/08/2017)  . Essential hypertension 02/20/2019  . GERD (gastroesophageal reflux disease)   . H pylori ulcer ~ 08/2016  . History of hiatal hernia   . Leaky heart valve ~ 09/2016  . Migraine    "maybe weekly" (03/08/2017)  . PONV (postoperative nausea and vomiting)   . Type 2 diabetes mellitus with hyperglycemia, without long-term current use of insulin ([place]) 02/20/2019    Past Surgical History:  Procedure Laterality Date  . CARPAL TUNNEL RELEASE Left 12/18/2019   Procedure: CARPAL TUNNEL RELEASE;  Surgeon: [name], MD;  Location: [facility];  Service: Orthopedics;  Laterality: Left;  . CHOLECYSTECTOMY N/A 03/08/2017   Procedure: LAPAROSCOPIC CHOLECYSTECTOMY;  Surgeon: Coralie Keens, MD;  Location: [facility];  Service: General;  Laterality: N/A;  . DILATION AND CURETTAGE OF UTERUS    . LAPAROSCOPIC  CHOLECYSTECTOMY  03/08/2017  . TRIGGER FINGER RELEASE Left 12/18/2019   Procedure: RELEASE TRIGGER FINGER/A-1 PULLEY;  Surgeon: [name], MD;  Location: [facility];  Service: Orthopedics;  Laterality: Left;  . TUBAL LIGATION      Social History   Tobacco Use  . Smoking status: Never Smoker  . Smokeless tobacco: Never Used  Substance Use Topics  . Alcohol use: Not Currently    Comment: 03/08/2017 "might have a drink on a holiday"    Family History  Problem Relation Age of Onset  . Thyroid disease Mother   . Diabetes Maternal Grandmother   . Heart disease Maternal Grandfather     ROS Per hpi  OBJECTIVE:  Today's Vitals   02/27/20 1603  BP: 126/85  Pulse:  95  Temp: 98 F (36.7 C)  TempSrc: Temporal  SpO2: 98%  Weight: 163 lb 3.2 oz (74 kg)  Height: 5' 4"  (1.626 m)   Body mass index is 28.01 kg/m.   Physical Exam Vitals and nursing note reviewed.  Constitutional:      Appearance: She is well-developed.  HENT:     Head: Normocephalic and atraumatic.  Eyes:     General: No scleral icterus.    Conjunctiva/sclera: Conjunctivae normal.     Pupils: Pupils are equal, round, and reactive to light.  Pulmonary:     Effort: Pulmonary effort is normal.  Musculoskeletal:     Cervical back: Neck supple.  Skin:    General: Skin is warm and dry.  Neurological:     Mental Status: She is alert and oriented to person, place, and time.       No results found for this or any previous visit (from the past 24 hour(s)).  No results found.   ASSESSMENT and PLAN  1. Type 2 diabetes mellitus with hyperglycemia, with long-term current use of insulin (HCC) Continue lantus titration of 2 units every 4 days to fasting goal 90-130 and no nocturnal lows.  Consider changing januvia to GLP1 (oral preferred)  Return in about 4 weeks (around 03/26/2020).    Rutherford Guys, MD [facility] [address], [place] [address] Ph.  [phone] Fax  [phone]

## 2020-02-27 NOTE — Patient Instructions (Signed)
° ° ° °  If you have lab work done today you will be contacted with your lab results within the next 2 weeks.  If you have not heard from us then please contact us. The fastest way to get your results is to register for My Chart. ° ° °IF you received an x-ray today, you will receive an invoice from [facility]. Please contact [facility] at [phone] with questions or concerns regarding your invoice.  ° °IF you received labwork today, you will receive an invoice from [facility]. Please contact [facility] at [phone] with questions or concerns regarding your invoice.  ° °Our billing staff will not be able to assist you with questions regarding bills from these companies. ° °You will be contacted with the lab results as soon as they are available. The fastest way to get your results is to activate your My Chart account. Instructions are located on the last page of this paperwork. If you have not heard from us regarding the results in 2 weeks, please contact this office. °  ° ° ° °

## 2020-03-05 ENCOUNTER — Other Ambulatory Visit: Payer: Self-pay

## 2020-03-05 ENCOUNTER — Other Ambulatory Visit: Payer: Self-pay | Admitting: Family Medicine

## 2020-03-05 ENCOUNTER — Ambulatory Visit (INDEPENDENT_AMBULATORY_CARE_PROVIDER_SITE_OTHER): Payer: Managed Care, Other (non HMO) | Admitting: Family Medicine

## 2020-03-05 ENCOUNTER — Encounter: Payer: Self-pay | Admitting: Family Medicine

## 2020-03-05 VITALS — BP 126/78 | HR 74 | Temp 97.6°F | Resp 15 | Ht 64.0 in | Wt 161.9 lb

## 2020-03-05 DIAGNOSIS — K219 Gastro-esophageal reflux disease without esophagitis: Secondary | ICD-10-CM | POA: Diagnosis not present

## 2020-03-05 DIAGNOSIS — B9689 Other specified bacterial agents as the cause of diseases classified elsewhere: Secondary | ICD-10-CM

## 2020-03-05 DIAGNOSIS — E1142 Type 2 diabetes mellitus with diabetic polyneuropathy: Secondary | ICD-10-CM | POA: Diagnosis not present

## 2020-03-05 DIAGNOSIS — N760 Acute vaginitis: Secondary | ICD-10-CM

## 2020-03-05 DIAGNOSIS — N898 Other specified noninflammatory disorders of vagina: Secondary | ICD-10-CM | POA: Diagnosis not present

## 2020-03-05 DIAGNOSIS — E1165 Type 2 diabetes mellitus with hyperglycemia: Secondary | ICD-10-CM

## 2020-03-05 LAB — POCT URINALYSIS DIP (MANUAL ENTRY)
Bilirubin, UA: NEGATIVE
Blood, UA: NEGATIVE
Glucose, UA: NEGATIVE mg/dL
Ketones, POC UA: NEGATIVE mg/dL
Leukocytes, UA: NEGATIVE
Nitrite, UA: NEGATIVE
Protein Ur, POC: NEGATIVE mg/dL
Spec Grav, UA: 1.025 (ref 1.010–1.025)
Urobilinogen, UA: 0.2 E.U./dL
pH, UA: 6 (ref 5.0–8.0)

## 2020-03-05 LAB — POCT WET + KOH PREP
Trich by wet prep: ABSENT
Yeast by KOH: ABSENT
Yeast by wet prep: ABSENT

## 2020-03-05 MED ORDER — PREGABALIN 100 MG PO CAPS: 100.0000 mg | ORAL_CAPSULE | Freq: Three times a day (TID) | ORAL | 3 refills | Status: DC

## 2020-03-05 MED ORDER — METRONIDAZOLE 0.75 % VA GEL: 1.0000 | Freq: Every day | VAGINAL | 0 refills | Status: DC

## 2020-03-05 MED ORDER — OMEPRAZOLE 20 MG PO CPDR
20.0000 mg | DELAYED_RELEASE_CAPSULE | Freq: Two times a day (BID) | ORAL | 3 refills | Status: DC
Start: 2020-03-05 — End: 2020-05-20

## 2020-03-05 NOTE — Patient Instructions (Signed)
° ° ° °  If you have lab work done today you will be contacted with your lab results within the next 2 weeks.  If you have not heard from us then please contact us. The fastest way to get your results is to register for My Chart. ° ° °IF you received an x-ray today, you will receive an invoice from [facility]. Please contact [facility] at [phone] with questions or concerns regarding your invoice.  ° °IF you received labwork today, you will receive an invoice from [facility]. Please contact [facility] at [phone] with questions or concerns regarding your invoice.  ° °Our billing staff will not be able to assist you with questions regarding bills from these companies. ° °You will be contacted with the lab results as soon as they are available. The fastest way to get your results is to activate your My Chart account. Instructions are located on the last page of this paperwork. If you have not heard from us regarding the results in 2 weeks, please contact this office. °  ° ° ° °

## 2020-03-05 NOTE — Progress Notes (Signed)
8/6/202110:09 AM  Sandra Moody 09-May-1971, 49 y.o., female [id number]  Chief Complaint  Patient presents with  . Diabetes    pt is here to fill out FMLA paperwork for her diabetes she has missed several days due to lathargia, her job is also concerened about her amount of bathroom breaks, pt reports it fluctuates, pt needs intermitent FMLA paperwork, pt reports after starting insulin she has had several episodes of stomach pains and dizzieness frequently since, pt also needs refill pregabalin for neuropathy    HPI:   Patient is a 49 y.o. female with past medical history significant for  DM2,HTN, migraines, asthma, GERD, biliary dyskinesia,who presents today forroutine followup  Last OV July 2021 - cont titration lantus She has been having dizziness, nausea and stomach pain in the morning, burning in her throat, gets better after eating Very tired at times Blurry vision remains Having polyuria and polydipsia Breathing is fine, no vomiting cbgs coming down in the 200s Denies any hypogylycemia Has been using omeprazole '20mg'$  prn which helps Work is requesting she complete intermittent FMLA Doctors appt every month, needs to be able to take restroom breaks Contract work for [employer] Neuropathy partially controlled on lyrica Has had carpal tunnel surgery left hand, needs right hand Glucometer readings: fasting: 229, 143, 155, 202, 227, 222 Middle night: 184, evening: 161, 168, 178 Afternoon: 324 7 day avg: 197 lantus upto 22 units Normal BM, probiotics, no melena or blood Having vaginal irritations and muscle cramps   Lab Results  Component Value Date   HGBA1C 8.4 (A) 01/05/2020   HGBA1C 6.1 (A) 08/07/2019   HGBA1C 6.7 (A) 02/20/2019   Lab Results  Component Value Date   LDLCALC 72 08/07/2019   CREATININE 1.00 01/25/2020   CREATININE 1.00 01/25/2020    Depression screen PHQ 2/9 03/05/2020 02/27/2020 02/13/2020  Decreased Interest 0 0 0  Down,  Depressed, Hopeless 0 0 0  PHQ - 2 Score 0 0 0    Fall Risk  03/05/2020 02/27/2020 02/13/2020 01/05/2020 11/13/2019  Falls in the past year? 0 0 0 0 0  Number falls in past yr: 0 0 0 0 -  Injury with Fall? 0 0 0 0 -  Risk for fall due to : No Fall Risks - No Fall Risks - -  Follow up Falls evaluation completed Falls evaluation completed Falls evaluation completed - Falls evaluation completed     Allergies  Allergen Reactions  . Ginger Itching and Swelling  . Passion Fruit Flavor [Flavoring Agent] Itching and Swelling  . Shellfish Allergy Anaphylaxis  . Other     ALLERGIC TO ALL TREE NUTS  . Tamiflu [Oseltamivir Phosphate]     Prior to Admission medications   Medication Sig Start Date End Date Taking? Authorizing Provider  ACCU-CHEK GUIDE test strip USE TO CHECK GLUCOSE TWICE DAILY,FASTING AND 2 HOURS AFTER DINNER 07/15/19  Yes Rutherford Guys, MD  albuterol (PROVENTIL) (2.5 MG/3ML) 0.083% nebulizer solution Take 3 mLs (2.5 mg total) by nebulization every 6 (six) hours as needed for wheezing or shortness of breath. 11/13/19  Yes Wendie Agreste, MD  ALBUTEROL SULFATE PO Take by mouth.   Yes [provider]  amLODipine (NORVASC) 2.5 MG tablet TAKE 1 TABLET BY MOUTH EVERY DAY 11/02/19  Yes Rutherford Guys, MD  BIOTIN PO Take by mouth.   Yes [provider]  blood glucose meter kit and supplies Per insurance preference. Use to check glucose twice a day (fasting and  2 hours after dinner)  Dx E11.65 10/30/18  Yes Rutherford Guys, MD  Cholecalciferol (VITAMIN D3 PO) Take by mouth.   Yes [provider]  Cyanocobalamin (VITAMIN B-12 PO) Take by mouth.   Yes [provider]  EPIPEN 2-PAK 0.3 MG/0.3ML SOAJ injection Inject 0.3 mg into the muscle as needed (for allergic reaction).  05/18/16  Yes [provider]  fluticasone (FLONASE) 50 MCG/ACT nasal spray USE 1 2 SPRAYS IN EACH NOSTRIL ONCE A DAY 10/25/18  Yes [provider]  Galcanezumab-gnlm  (EMGALITY) 120 MG/ML SOAJ Inject 240 mg into the skin every 30 (thirty) days. 10/21/19  Yes Suzzanne Cloud, NP  Galcanezumab-gnlm (EMGALITY) 120 MG/ML SOAJ Inject 120 mg into the skin every 30 (thirty) days. 10/21/19  Yes Suzzanne Cloud, NP  insulin glargine (LANTUS SOLOSTAR) 100 UNIT/ML Solostar Pen Inject 30 Units into the skin at bedtime. 02/13/20  Yes Rutherford Guys, MD  Insulin Pen Needle (PEN NEEDLES) 32G X 6 MM MISC Use once a day with lantus injection 02/13/20  Yes Rutherford Guys, MD  ketoconazole (NIZORAL) 2 % shampoo Apply 1 application topically 2 (two) times a week. Apply to affected area lather, leave in place for 5 mins and then rinse off with water 01/15/20  Yes Rutherford Guys, MD  levocetirizine (XYZAL) 5 MG tablet 1 2 TABLET IN THE EVENING ONCE A DAY ORALLY 15 11/05/18  Yes [provider]  medroxyPROGESTERone (DEPO-PROVERA) 150 MG/ML injection  08/15/18  Yes [provider]  montelukast (SINGULAIR) 10 MG tablet Take 10 mg by mouth at bedtime.   Yes [provider]  olopatadine (PATANOL) 0.1 % ophthalmic solution Place 1 drop into both eyes 2 (two) times daily. 12/25/18  Yes Corum, Rex Kras, MD  pregabalin (LYRICA) 100 MG capsule Take 100 mg by mouth 3 (three) times daily. 10/09/19  Yes [provider]  rizatriptan (MAXALT) 10 MG tablet TAKE 1 TABLET (10 MG TOTAL) BY MOUTH 3 (THREE) TIMES DAILY AS NEEDED FOR MIGRAINE. 06/02/19  Yes Kathrynn Ducking, MD  sitaGLIPtin (JANUVIA) 100 MG tablet Take 1 tablet (100 mg total) by mouth daily. 02/27/20  Yes Rutherford Guys, MD  SYMBICORT 160-4.5 MCG/ACT inhaler INHALE 2 PUFFS INTO THE LUNGS DAILY. 05/09/18  Yes Valentina Shaggy, MD  VITAMIN E PO Take by mouth.   Yes [provider]    Past Medical History:  Diagnosis Date  . Acute renal disease   . Asthma   . Bilateral carpal tunnel syndrome 03/11/2019  . Common migraine with intractable migraine 08/29/2018  . Daily headache    (03/08/2017)  .  Essential hypertension 02/20/2019  . GERD (gastroesophageal reflux disease)   . H pylori ulcer ~ 08/2016  . History of hiatal hernia   . Leaky heart valve ~ 09/2016  . Migraine    "maybe weekly" (03/08/2017)  . PONV (postoperative nausea and vomiting)   . Type 2 diabetes mellitus with hyperglycemia, without long-term current use of insulin ([place]) 02/20/2019    Past Surgical History:  Procedure Laterality Date  . CARPAL TUNNEL RELEASE Left 12/18/2019   Procedure: CARPAL TUNNEL RELEASE;  Surgeon: [name], MD;  Location: [place];  Service: Orthopedics;  Laterality: Left;  . CHOLECYSTECTOMY N/A 03/08/2017   Procedure: LAPAROSCOPIC CHOLECYSTECTOMY;  Surgeon: Coralie Keens, MD;  Location: [facility];  Service: General;  Laterality: N/A;  . DILATION AND CURETTAGE OF UTERUS    . LAPAROSCOPIC CHOLECYSTECTOMY  03/08/2017  . TRIGGER  FINGER RELEASE Left 12/18/2019   Procedure: RELEASE TRIGGER FINGER/A-1 PULLEY;  Surgeon: [name], MD;  Location: [place];  Service: Orthopedics;  Laterality: Left;  . TUBAL LIGATION      Social History   Tobacco Use  . Smoking status: Never Smoker  . Smokeless tobacco: Never Used  Substance Use Topics  . Alcohol use: Not Currently    Comment: 03/08/2017 "might have a drink on a holiday"    Family History  Problem Relation Age of Onset  . Thyroid disease Mother   . Diabetes Maternal Grandmother   . Heart disease Maternal Grandfather     ROS Per hpi  OBJECTIVE:  Today's Vitals   03/05/20 0958 03/05/20 1006  BP: (!) 143/91 126/78  Pulse: 74   Resp: 15   Temp: 97.6 F (36.4 C)   TempSrc: Temporal   SpO2: 100%   Weight: 161 lb 14.4 oz (73.4 kg)   Height: _0  (1.626 m)    Body mass index is 27.79 kg/m.   Physical Exam Vitals and nursing note reviewed.  Constitutional:      Appearance: She is well-developed.  HENT:     Head: Normocephalic and atraumatic.  Eyes:     General: No scleral icterus.     Conjunctiva/sclera: Conjunctivae normal.     Pupils: Pupils are equal, round, and reactive to light.  Pulmonary:     Effort: Pulmonary effort is normal.  Musculoskeletal:     Cervical back: Neck supple.  Skin:    General: Skin is warm and dry.  Neurological:     Mental Status: She is alert and oriented to person, place, and time.     Results for orders placed or performed in visit on 03/05/20 (from the past 24 hour(s))  POCT urinalysis dipstick     Status: None   Collection Time: 03/05/20 10:47 AM  Result Value Ref Range   Color, UA yellow yellow   Clarity, UA clear clear   Glucose, UA negative negative mg/dL   Bilirubin, UA negative negative   Ketones, POC UA negative negative mg/dL   Spec Grav, UA 1.025 1.010 - 1.025   Blood, UA negative negative   pH, UA 6.0 5.0 - 8.0   Protein Ur, POC negative negative mg/dL   Urobilinogen, UA 0.2 0.2 or 1.0 E.U./dL   Nitrite, UA Negative Negative   Leukocytes, UA Negative Negative  POCT Wet + KOH Prep     Status: Abnormal   Collection Time: 03/05/20 11:01 AM  Result Value Ref Range   Yeast by KOH Absent Absent   Yeast by wet prep Absent Absent   WBC by wet prep Few Few   Clue Cells Wet Prep HPF POC Moderate (A) None   Trich by wet prep Absent Absent   Bacteria Wet Prep HPF POC Moderate (A) Few   Epithelial Cells By [employer] Pref ([facility]) Few None, Few, Too numerous to count   RBC,UR,HPF,POC None None RBC/hpf    No results found.   ASSESSMENT and PLAN  1. Type 2 diabetes mellitus with hyperglycemia, without long-term current use of insulin (HCC) cbgs cont to improve, cont titration of lantus - Fructosamine - POCT urinalysis dipstick - TSH - Comprehensive metabolic panel  2. Gastroesophageal reflux disease, unspecified whether esophagitis present Increase omeprazole to BID, related to [place]?  3. Diabetic polyneuropathy associated with type 2 diabetes mellitus (HCC) Stable. Cont lyrica  4. Bacterial vaginosis rx for vaginal  flagyl given   5. Vaginal irritation -  POCT Wet + KOH Prep - +BV  Other orders - pregabalin (LYRICA) 100 MG capsule; Take 1 capsule (100 mg total) by mouth 3 (three) times daily. - omeprazole (PRILOSEC) 20 MG capsule; Take 1 capsule (20 mg total) by mouth 2 (two) times daily before a meal. - metroNIDAZOLE (METROGEL) 0.75 % vaginal gel; Place 1 Applicatorful vaginally at bedtime.  Return in about 4 weeks (around 04/02/2020).    Rutherford Guys, MD Primary Care at [place], [place] [address] Ph.  [phone] Fax [phone]

## 2020-03-06 LAB — COMPREHENSIVE METABOLIC PANEL
ALT: 23 IU/L (ref 0–32)
AST: 23 IU/L (ref 0–40)
Albumin/Globulin Ratio: 1.9 (ref 1.2–2.2)
Albumin: 4.3 g/dL (ref 3.8–4.8)
Alkaline Phosphatase: 80 IU/L (ref 48–121)
BUN/Creatinine Ratio: 6 — ABNORMAL LOW (ref 9–23)
BUN: 6 mg/dL (ref 6–24)
Bilirubin Total: 0.2 mg/dL (ref 0.0–1.2)
CO2: 23 mmol/L (ref 20–29)
Calcium: 9.6 mg/dL (ref 8.7–10.2)
Chloride: 106 mmol/L (ref 96–106)
Creatinine, Ser: 0.94 mg/dL (ref 0.57–1.00)
GFR calc Af Amer: 84 mL/min/{1.73_m2} (ref >59)
GFR calc non Af Amer: 72 mL/min/{1.73_m2} (ref >59)
Globulin, Total: 2.3 g/dL (ref 1.5–4.5)
Glucose: 91 mg/dL (ref 65–99)
Potassium: 4 mmol/L (ref 3.5–5.2)
Sodium: 141 mmol/L (ref 134–144)
Total Protein: 6.6 g/dL (ref 6.0–8.5)

## 2020-03-06 LAB — FRUCTOSAMINE: Fructosamine: 387 umol/L — ABNORMAL HIGH (ref 0–285)

## 2020-03-06 LAB — TSH: TSH: 1.9 u[IU]/mL (ref 0.450–4.500)

## 2020-03-09 ENCOUNTER — Telehealth: Payer: Self-pay | Admitting: Family Medicine

## 2020-03-09 NOTE — Telephone Encounter (Signed)
Pt came in a dropped off FMLA paperwork. Pt did pay $15 collection fee. Paperwork is to be faxed over. Paperwork is in [employer] loss adjuster beside nurse station. Please advise.

## 2020-03-11 ENCOUNTER — Telehealth: Payer: Self-pay

## 2020-03-11 NOTE — Telephone Encounter (Signed)
FMLA forms received, give to pcp for completion. Forms are to be faxed to [facility] once completed.

## 2020-03-12 NOTE — Telephone Encounter (Signed)
FMLA forms completed and faxed to [facility] confirmation received . Copy in doctor's box. Tried calling patient to inform completion and ask if she needs a copy . VML full .

## 2020-03-29 ENCOUNTER — Encounter: Payer: Self-pay | Admitting: Family Medicine

## 2020-03-29 ENCOUNTER — Other Ambulatory Visit: Payer: Self-pay

## 2020-03-29 ENCOUNTER — Ambulatory Visit (INDEPENDENT_AMBULATORY_CARE_PROVIDER_SITE_OTHER): Payer: Managed Care, Other (non HMO) | Admitting: Family Medicine

## 2020-03-29 VITALS — BP 124/83 | HR 84 | Temp 97.7°F | Ht 64.0 in | Wt 163.6 lb

## 2020-03-29 DIAGNOSIS — E119 Type 2 diabetes mellitus without complications: Secondary | ICD-10-CM | POA: Diagnosis not present

## 2020-03-29 DIAGNOSIS — R300 Dysuria: Secondary | ICD-10-CM

## 2020-03-29 LAB — POCT URINALYSIS DIP (MANUAL ENTRY)
Bilirubin, UA: NEGATIVE
Blood, UA: NEGATIVE
Glucose, UA: >=1000 mg/dL — AB
Leukocytes, UA: NEGATIVE
Nitrite, UA: NEGATIVE
Protein Ur, POC: NEGATIVE mg/dL
Spec Grav, UA: >=1.03 — AB (ref 1.010–1.025)
Urobilinogen, UA: 0.2 E.U./dL
pH, UA: 5.5 (ref 5.0–8.0)

## 2020-03-29 NOTE — Patient Instructions (Signed)
° ° ° °  If you have lab work done today you will be contacted with your lab results within the next 2 weeks.  If you have not heard from us then please contact us. The fastest way to get your results is to register for My Chart. ° ° °IF you received an x-ray today, you will receive an invoice from [facility]. Please contact [facility] at [phone] with questions or concerns regarding your invoice.  ° °IF you received labwork today, you will receive an invoice from [facility]. Please contact [facility] at [phone] with questions or concerns regarding your invoice.  ° °Our billing staff will not be able to assist you with questions regarding bills from these companies. ° °You will be contacted with the lab results as soon as they are available. The fastest way to get your results is to activate your My Chart account. Instructions are located on the last page of this paperwork. If you have not heard from us regarding the results in 2 weeks, please contact this office. °  ° ° ° °

## 2020-03-29 NOTE — Progress Notes (Signed)
8/30/20214:09 PM  Sandra Moody 03-09-1971, 49 y.o., female [id number]  Chief Complaint  Patient presents with  . Follow-up    still having bladder fullness and funny feeling in that area. Wants to know is it ok to take miralax and glucosamine daily    HPI:   Patient is a 49 y.o. female with past medical history significant for DM2,HTN, migraines, asthma, GERD, biliary dyskinesia,who presents today forroutine followup  Last OV aug 2021 - titrate lantus, rx BV, increased PPI Fructosamine ~ A1c 8.2, improved  She reports that her GI resolved after she started taking linzess, now doing miralax daily, she is doing much better  lantus upto 30 units, fasting cbgs are in low 200s, better She continues to slowly increase  She continues to have very heavy pressure burning with urination She completed flagyl but then had to use miconzaole and AD ointment as she was very irritated   Depression screen [facility] 2/9 03/05/2020 02/27/2020 02/13/2020  Decreased Interest 0 0 0  Down, Depressed, Hopeless 0 0 0  PHQ - 2 Score 0 0 0    Fall Risk  03/05/2020 02/27/2020 02/13/2020 01/05/2020 11/13/2019  Falls in the past year? 0 0 0 0 0  Number falls in past yr: 0 0 0 0 -  Injury with Fall? 0 0 0 0 -  Risk for fall due to : No Fall Risks - No Fall Risks - -  Follow up Falls evaluation completed Falls evaluation completed Falls evaluation completed - Falls evaluation completed     Allergies  Allergen Reactions  . Ginger Itching and Swelling  . Passion Fruit Flavor [Flavoring Agent] Itching and Swelling  . Shellfish Allergy Anaphylaxis  . Other     ALLERGIC TO ALL TREE NUTS  . Tamiflu [Oseltamivir Phosphate]     Prior to Admission medications   Medication Sig Start Date End Date Taking? Authorizing Provider  ACCU-CHEK GUIDE test strip USE TO CHECK GLUCOSE TWICE DAILY,FASTING AND 2 HOURS AFTER DINNER 07/15/19  Yes Rutherford Guys, MD  albuterol (PROVENTIL) (2.5 MG/3ML) 0.083% nebulizer solution  Take 3 mLs (2.5 mg total) by nebulization every 6 (six) hours as needed for wheezing or shortness of breath. 11/13/19  Yes Wendie Agreste, MD  ALBUTEROL SULFATE PO Take by mouth.   Yes [provider]  amLODipine (NORVASC) 2.5 MG tablet TAKE 1 TABLET BY MOUTH EVERY DAY 11/02/19  Yes Rutherford Guys, MD  BIOTIN PO Take by mouth.   Yes [provider]  blood glucose meter kit and supplies Per insurance preference. Use to check glucose twice a day (fasting and 2 hours after dinner)  Dx E11.65 10/30/18  Yes Rutherford Guys, MD  Cholecalciferol (VITAMIN D3 PO) Take by mouth.   Yes [provider]  Cyanocobalamin (VITAMIN B-12 PO) Take by mouth.   Yes [provider]  EPIPEN 2-PAK 0.3 MG/0.3ML SOAJ injection Inject 0.3 mg into the muscle as needed (for allergic reaction).  05/18/16  Yes [provider]  fluticasone (FLONASE) 50 MCG/ACT nasal spray USE 1 2 SPRAYS IN EACH NOSTRIL ONCE A DAY 10/25/18  Yes [provider]  Galcanezumab-gnlm (EMGALITY) 120 MG/ML SOAJ Inject 240 mg into the skin every 30 (thirty) days. 10/21/19  Yes Suzzanne Cloud, NP  Galcanezumab-gnlm (EMGALITY) 120 MG/ML SOAJ Inject 120 mg into the skin every 30 (thirty) days. 10/21/19  Yes Suzzanne Cloud, NP  insulin glargine (LANTUS SOLOSTAR) 100 UNIT/ML Solostar Pen Inject 30 Units into the  skin at bedtime. 02/13/20  Yes Rutherford Guys, MD  Insulin Pen Needle (PEN NEEDLES) 32G X 6 MM MISC Use once a day with lantus injection 02/13/20  Yes Rutherford Guys, MD  ketoconazole (NIZORAL) 2 % shampoo Apply 1 application topically 2 (two) times a week. Apply to affected area lather, leave in place for 5 mins and then rinse off with water 01/15/20  Yes Rutherford Guys, MD  levocetirizine (XYZAL) 5 MG tablet 1 2 TABLET IN THE EVENING ONCE A DAY ORALLY 15 11/05/18  Yes [provider]  medroxyPROGESTERone (DEPO-PROVERA) 150 MG/ML injection  08/15/18  Yes [provider]   metroNIDAZOLE (METROGEL) 0.75 % vaginal gel Place 1 Applicatorful vaginally at bedtime. 03/05/20  Yes Rutherford Guys, MD  montelukast (SINGULAIR) 10 MG tablet Take 10 mg by mouth at bedtime.   Yes [provider]  olopatadine (PATANOL) 0.1 % ophthalmic solution Place 1 drop into both eyes 2 (two) times daily. 12/25/18  Yes Corum, Rex Kras, MD  omeprazole (PRILOSEC) 20 MG capsule Take 1 capsule (20 mg total) by mouth 2 (two) times daily before a meal. 03/05/20  Yes Rutherford Guys, MD  pregabalin (LYRICA) 100 MG capsule Take 1 capsule (100 mg total) by mouth 3 (three) times daily. 03/05/20  Yes Rutherford Guys, MD  rizatriptan (MAXALT) 10 MG tablet TAKE 1 TABLET (10 MG TOTAL) BY MOUTH 3 (THREE) TIMES DAILY AS NEEDED FOR MIGRAINE. 06/02/19  Yes Kathrynn Ducking, MD  sitaGLIPtin (JANUVIA) 100 MG tablet Take 1 tablet (100 mg total) by mouth daily. 02/27/20  Yes Rutherford Guys, MD  SYMBICORT 160-4.5 MCG/ACT inhaler INHALE 2 PUFFS INTO THE LUNGS DAILY. 05/09/18  Yes Valentina Shaggy, MD  VITAMIN E PO Take by mouth.   Yes [provider]    Past Medical History:  Diagnosis Date  . Acute renal disease   . Asthma   . Bilateral carpal tunnel syndrome 03/11/2019  . Common migraine with intractable migraine 08/29/2018  . Daily headache    (03/08/2017)  . Essential hypertension 02/20/2019  . GERD (gastroesophageal reflux disease)   . H pylori ulcer ~ 08/2016  . History of hiatal hernia   . Leaky heart valve ~ 09/2016  . Migraine    "maybe weekly" (03/08/2017)  . PONV (postoperative nausea and vomiting)   . Type 2 diabetes mellitus with hyperglycemia, without long-term current use of insulin ([place]) 02/20/2019    Past Surgical History:  Procedure Laterality Date  . CARPAL TUNNEL RELEASE Left 12/18/2019   Procedure: CARPAL TUNNEL RELEASE;  Surgeon: [name], MD;  Location: [place];  Service: Orthopedics;  Laterality: Left;  . CHOLECYSTECTOMY N/A 03/08/2017   Procedure:  LAPAROSCOPIC CHOLECYSTECTOMY;  Surgeon: Coralie Keens, MD;  Location: [place];  Service: General;  Laterality: N/A;  . DILATION AND CURETTAGE OF UTERUS    . LAPAROSCOPIC CHOLECYSTECTOMY  03/08/2017  . TRIGGER FINGER RELEASE Left 12/18/2019   Procedure: RELEASE TRIGGER FINGER/A-1 PULLEY;  Surgeon: [name], MD;  Location: [facility];  Service: Orthopedics;  Laterality: Left;  . TUBAL LIGATION      Social History   Tobacco Use  . Smoking status: Never Smoker  . Smokeless tobacco: Never Used  Substance Use Topics  . Alcohol use: Not Currently    Comment: 03/08/2017 "might have a drink on a holiday"    Family History  Problem Relation Age of Onset  . Thyroid disease Mother   . Diabetes Maternal  Grandmother   . Heart disease Maternal Grandfather     ROS Per hpi  OBJECTIVE:  Today's Vitals   03/29/20 1559  BP: 124/83  Pulse: 84  Temp: 97.7 F (36.5 C)  SpO2: 100%  Weight: 163 lb 9.6 oz (74.2 kg)  Height: _0  (1.626 m)   Body mass index is 28.08 kg/m.   Physical Exam Vitals and nursing note reviewed.  Constitutional:      Appearance: She is well-developed.  HENT:     Head: Normocephalic and atraumatic.  Eyes:     General: No scleral icterus.    Conjunctiva/sclera: Conjunctivae normal.     Pupils: Pupils are equal, round, and reactive to light.  Pulmonary:     Effort: Pulmonary effort is normal.  Musculoskeletal:     Cervical back: Neck supple.  Skin:    General: Skin is warm and dry.  Neurological:     Mental Status: She is alert and oriented to person, place, and time.     Results for orders placed or performed in visit on 03/29/20 (from the past 24 hour(s))  POCT urinalysis dipstick     Status: Abnormal   Collection Time: 03/29/20  4:20 PM  Result Value Ref Range   Color, UA yellow yellow   Clarity, UA clear clear   Glucose, UA >=1,000 (A) negative mg/dL   Bilirubin, UA negative negative   Ketones, POC UA trace (5) (A) negative  mg/dL   Spec Grav, UA >=1.030 (A) 1.010 - 1.025   Blood, UA negative negative   pH, UA 5.5 5.0 - 8.0   Protein Ur, POC negative negative mg/dL   Urobilinogen, UA 0.2 0.2 or 1.0 E.U./dL   Nitrite, UA Negative Negative   Leukocytes, UA Negative Negative    No results found.   ASSESSMENT and PLAN  1. Type 2 diabetes mellitus without complication, without long-term current use of insulin (HCC) Cont to titrate lantus to fasting goal. Cont working on LFM, RTC precautions given.  2. Dysuria - POCT urinalysis dipstick - no infection, + glucosuria.  Push fluids, work on diet  Return in about 3 months (around 06/29/2020).    Rutherford Guys, MD Primary Care at [place], [place] [address] Ph.  [phone] Fax [phone]

## 2020-04-06 ENCOUNTER — Other Ambulatory Visit: Payer: Self-pay

## 2020-04-06 ENCOUNTER — Other Ambulatory Visit: Payer: Self-pay | Admitting: Registered Nurse

## 2020-04-06 ENCOUNTER — Encounter: Payer: Self-pay | Admitting: Registered Nurse

## 2020-04-06 ENCOUNTER — Ambulatory Visit (INDEPENDENT_AMBULATORY_CARE_PROVIDER_SITE_OTHER): Payer: Managed Care, Other (non HMO) | Admitting: Registered Nurse

## 2020-04-06 VITALS — BP 123/81 | HR 83 | Temp 97.9°F | Resp 18 | Ht 64.0 in | Wt 165.8 lb

## 2020-04-06 DIAGNOSIS — M5416 Radiculopathy, lumbar region: Secondary | ICD-10-CM | POA: Diagnosis not present

## 2020-04-06 LAB — URINALYSIS, ROUTINE W REFLEX MICROSCOPIC
Bilirubin, UA: NEGATIVE
Glucose, UA: NEGATIVE
Ketones, UA: NEGATIVE
Leukocytes,UA: NEGATIVE
Nitrite, UA: NEGATIVE
Protein,UA: NEGATIVE
RBC, UA: NEGATIVE
Specific Gravity, UA: 1.019 (ref 1.005–1.030)
Urobilinogen, Ur: 0.2 mg/dL (ref 0.2–1.0)
pH, UA: 6 (ref 5.0–7.5)

## 2020-04-06 MED ORDER — TRAMADOL HCL 50 MG PO TABS: 50.0000 mg | ORAL_TABLET | Freq: Three times a day (TID) | ORAL | 0 refills | Status: AC | PRN

## 2020-04-06 MED ORDER — METHOCARBAMOL 500 MG PO TABS: 500.0000 mg | ORAL_TABLET | Freq: Three times a day (TID) | ORAL | 0 refills | Status: DC | PRN

## 2020-04-06 NOTE — Progress Notes (Signed)
Acute Office Visit  Subjective:    Patient ID: Sandra Moody, female    DOB: October 25, 1970, 49 y.o.   MRN: [id number]  Chief Complaint  Patient presents with  . Pain    patient states since last week the pain started with her right leg then moved to her back causing severe pain. Per patient she had used tramadol and it still has no relief,    HPI Patient is in today for lower back pain Onset last week on Wednesday with R leg pain. Over the next few days moved up to lower right back. Pt felt immobilized yesterday by the pain Had leftover tramadol from a hand surgery. Took these and was given some relief. No change to neuropathies. Denies saddle symptoms Some concern for UTI - had dip last week that showed no evidence of infection, requesting to repeat today.  No other symptoms of note. No other concerns today.  Past Medical History:  Diagnosis Date  . Acute renal disease   . Asthma   . Bilateral carpal tunnel syndrome 03/11/2019  . Common migraine with intractable migraine 08/29/2018  . Daily headache    (03/08/2017)  . Essential hypertension 02/20/2019  . GERD (gastroesophageal reflux disease)   . H pylori ulcer ~ 08/2016  . History of hiatal hernia   . Leaky heart valve ~ 09/2016  . Migraine    "maybe weekly" (03/08/2017)  . PONV (postoperative nausea and vomiting)   . Type 2 diabetes mellitus with hyperglycemia, without long-term current use of insulin ([place]) 02/20/2019    Past Surgical History:  Procedure Laterality Date  . CARPAL TUNNEL RELEASE Left 12/18/2019   Procedure: CARPAL TUNNEL RELEASE;  Surgeon: [name], MD;  Location: [facility];  Service: Orthopedics;  Laterality: Left;  . CHOLECYSTECTOMY N/A 03/08/2017   Procedure: LAPAROSCOPIC CHOLECYSTECTOMY;  Surgeon: Coralie Keens, MD;  Location: [facility];  Service: General;  Laterality: N/A;  . DILATION AND CURETTAGE OF UTERUS    . LAPAROSCOPIC CHOLECYSTECTOMY  03/08/2017  . TRIGGER FINGER RELEASE Left  12/18/2019   Procedure: RELEASE TRIGGER FINGER/A-1 PULLEY;  Surgeon: [name], MD;  Location: [facility];  Service: Orthopedics;  Laterality: Left;  . TUBAL LIGATION      Family History  Problem Relation Age of Onset  . Thyroid disease Mother   . Diabetes Maternal Grandmother   . Heart disease Maternal Grandfather     Social History   Socioeconomic History  . Marital status: Significant Other    Spouse name: Not on file  . Number of children: 3  . Years of education: Not on file  . Highest education level: 12th grade  Occupational History    Comment: [employer]   Tobacco Use  . Smoking status: Never Smoker  . Smokeless tobacco: Never Used  Vaping Use  . Vaping Use: Never used  Substance and Sexual Activity  . Alcohol use: Not Currently    Comment: 03/08/2017 "might have a drink on a holiday"  . Drug use: No  . Sexual activity: Yes    Partners: Male    Birth control/protection: Injection  Other Topics Concern  . Not on file  Social History Narrative   ** Merged History Encounter **   Right handed    Lives at home with significant other 2 cups daily of caffeine    Social Determinants of Health   Financial Resource Strain:   . Difficulty of Paying Living Expenses: Not on file  Food Insecurity:   .  Worried About Charity fundraiser in the Last Year: Not on file  . Ran Out of Food in the Last Year: Not on file  Transportation Needs:   . Lack of Transportation (Medical): Not on file  . Lack of Transportation (Non-Medical): Not on file  Physical Activity:   . Days of Exercise per Week: Not on file  . Minutes of Exercise per Session: Not on file  Stress:   . Feeling of Stress : Not on file  Social Connections:   . Frequency of Communication with Friends and Family: Not on file  . Frequency of Social Gatherings with Friends and Family: Not on file  . Attends Religious Services: Not on file  . Active Member of Clubs or Organizations: Not on file   . Attends Archivist Meetings: Not on file  . Marital Status: Not on file  Intimate Partner Violence:   . Fear of Current or Ex-Partner: Not on file  . Emotionally Abused: Not on file  . Physically Abused: Not on file  . Sexually Abused: Not on file    Outpatient Medications Prior to Visit  Medication Sig Dispense Refill  . ACCU-CHEK GUIDE test strip USE TO CHECK GLUCOSE TWICE DAILY,FASTING AND 2 HOURS AFTER DINNER 100 strip 5  . albuterol (PROVENTIL) (2.5 MG/3ML) 0.083% nebulizer solution Take 3 mLs (2.5 mg total) by nebulization every 6 (six) hours as needed for wheezing or shortness of breath. 75 mL 1  . ALBUTEROL SULFATE PO Take by mouth.    Marland Kitchen amLODipine (NORVASC) 2.5 MG tablet TAKE 1 TABLET BY MOUTH EVERY DAY 90 tablet 1  . BIOTIN PO Take by mouth.    . blood glucose meter kit and supplies Per insurance preference. Use to check glucose twice a day (fasting and 2 hours after dinner)  Dx E11.65 1 each 5  . Cholecalciferol (VITAMIN D3 PO) Take by mouth.    . Cyanocobalamin (VITAMIN B-12 PO) Take by mouth.    . EPIPEN 2-PAK 0.3 MG/0.3ML SOAJ injection Inject 0.3 mg into the muscle as needed (for allergic reaction).   2  . fluticasone (FLONASE) 50 MCG/ACT nasal spray USE 1 2 SPRAYS IN EACH NOSTRIL ONCE A DAY    . Galcanezumab-gnlm (EMGALITY) 120 MG/ML SOAJ Inject 240 mg into the skin every 30 (thirty) days. 2 pen 0  . Galcanezumab-gnlm (EMGALITY) 120 MG/ML SOAJ Inject 120 mg into the skin every 30 (thirty) days. 1 pen 11  . insulin glargine (LANTUS SOLOSTAR) 100 UNIT/ML Solostar Pen Inject 30 Units into the skin at bedtime. 15 mL 2  . Insulin Pen Needle (PEN NEEDLES) 32G X 6 MM MISC Use once a day with lantus injection 100 each 2  . ketoconazole (NIZORAL) 2 % shampoo Apply 1 application topically 2 (two) times a week. Apply to affected area lather, leave in place for 5 mins and then rinse off with water 120 mL 5  . levocetirizine (XYZAL) 5 MG tablet 1 2 TABLET IN THE EVENING  ONCE A DAY ORALLY 15    . medroxyPROGESTERone (DEPO-PROVERA) 150 MG/ML injection     . metroNIDAZOLE (METROGEL) 0.75 % vaginal gel Place 1 Applicatorful vaginally at bedtime. 70 g 0  . montelukast (SINGULAIR) 10 MG tablet Take 10 mg by mouth at bedtime.    Marland Kitchen olopatadine (PATANOL) 0.1 % ophthalmic solution Place 1 drop into both eyes 2 (two) times daily. 5 mL 12  . omeprazole (PRILOSEC) 20 MG capsule Take 1 capsule (20 mg total) by mouth  2 (two) times daily before a meal. 60 capsule 3  . pregabalin (LYRICA) 100 MG capsule Take 1 capsule (100 mg total) by mouth 3 (three) times daily. 90 capsule 3  . rizatriptan (MAXALT) 10 MG tablet TAKE 1 TABLET (10 MG TOTAL) BY MOUTH 3 (THREE) TIMES DAILY AS NEEDED FOR MIGRAINE. 6 tablet 6  . sitaGLIPtin (JANUVIA) 100 MG tablet Take 1 tablet (100 mg total) by mouth daily. 90 tablet 1  . SYMBICORT 160-4.5 MCG/ACT inhaler INHALE 2 PUFFS INTO THE LUNGS DAILY. 10.2 Inhaler 0  . VITAMIN E PO Take by mouth.     No facility-administered medications prior to visit.    Allergies  Allergen Reactions  . Ginger Itching and Swelling  . Passion Fruit Flavor [Flavoring Agent] Itching and Swelling  . Shellfish Allergy Anaphylaxis  . Other     ALLERGIC TO ALL TREE NUTS  . Tamiflu [Oseltamivir Phosphate]     Review of Systems  Constitutional: Negative.   HENT: Negative.   Eyes: Negative.   Respiratory: Negative.   Cardiovascular: Negative.   Gastrointestinal: Negative.   Endocrine: Negative.   Genitourinary: Negative.   Musculoskeletal: Positive for back pain. Negative for arthralgias, gait problem, joint swelling, myalgias, neck pain and neck stiffness.  Skin: Negative.   Allergic/Immunologic: Negative.   Neurological: Negative.   Hematological: Negative.   Psychiatric/Behavioral: Negative.   All other systems reviewed and are negative.      Objective:    Physical Exam Vitals and nursing note reviewed.  Constitutional:      General: She is not in  acute distress.    Appearance: Normal appearance. She is normal weight. She is not ill-appearing, toxic-appearing or diaphoretic.  Cardiovascular:     Rate and Rhythm: Normal rate and regular rhythm.  Pulmonary:     Effort: Pulmonary effort is normal. No respiratory distress.  Musculoskeletal:        General: No swelling, tenderness, deformity or signs of injury.     Right lower leg: No edema.     Left lower leg: No edema.     Comments: Positive R leg straight leg raise.  Otherwise passive ROM wnl  Skin:    Capillary Refill: Capillary refill takes less than 2 seconds.  Neurological:     General: No focal deficit present.     Mental Status: She is alert and oriented to person, place, and time. Mental status is at baseline.  Psychiatric:        Mood and Affect: Mood normal.        Behavior: Behavior normal.        Thought Content: Thought content normal.        Judgment: Judgment normal.     BP 123/81   Pulse 83   Temp 97.9 F (36.6 C) (Temporal)   Resp 18   Ht _0  (1.626 m)   Wt 165 lb 12.8 oz (75.2 kg)   SpO2 97%   BMI 28.46 kg/m  Wt Readings from Last 3 Encounters:  04/06/20 165 lb 12.8 oz (75.2 kg)  03/29/20 163 lb 9.6 oz (74.2 kg)  03/05/20 161 lb 14.4 oz (73.4 kg)    There are no preventive care reminders to display for this patient.  There are no preventive care reminders to display for this patient.   Lab Results  Component Value Date   TSH 1.900 03/05/2020   Lab Results  Component Value Date   WBC 6.6 01/25/2020   WBC 6.6 01/25/2020   HGB  14.1 01/25/2020   HGB 14.1 01/25/2020   HCT 43.0 01/25/2020   HCT 43.0 01/25/2020   MCV 93.1 01/25/2020   MCV 93.1 01/25/2020   PLT 328 01/25/2020   PLT 328 01/25/2020   Lab Results  Component Value Date   NA 141 03/05/2020   K 4.0 03/05/2020   CO2 23 03/05/2020   GLUCOSE 91 03/05/2020   BUN 6 03/05/2020   CREATININE 0.94 03/05/2020   BILITOT 0.2 03/05/2020   ALKPHOS 80 03/05/2020   AST 23 03/05/2020    ALT 23 03/05/2020   PROT 6.6 03/05/2020   ALBUMIN 4.3 03/05/2020   CALCIUM 9.6 03/05/2020   ANIONGAP 11 01/25/2020   Lab Results  Component Value Date   CHOL 124 08/07/2019   Lab Results  Component Value Date   HDL 35 (L) 08/07/2019   Lab Results  Component Value Date   LDLCALC 72 08/07/2019   Lab Results  Component Value Date   TRIG 86 08/07/2019   Lab Results  Component Value Date   CHOLHDL 3.5 08/07/2019   Lab Results  Component Value Date   HGBA1C 8.4 (A) 01/05/2020       Assessment & Plan:   Problem List Items Addressed This Visit    None    Visit Diagnoses    Lumbar back pain with radiculopathy affecting right lower extremity    -  Primary   Relevant Medications   traMADol (ULTRAM) 50 MG tablet   methocarbamol (ROBAXIN) 500 MG tablet       Meds ordered this encounter  Medications  . traMADol (ULTRAM) 50 MG tablet    Sig: Take 1 tablet (50 mg total) by mouth every 8 (eight) hours as needed for up to 5 days.    Dispense:  15 tablet    Refill:  0    Order Specific Question:   Supervising Provider    Answer:   Carlota Raspberry, JEFFREY R [[id number]]  . methocarbamol (ROBAXIN) 500 MG tablet    Sig: Take 1 tablet (500 mg total) by mouth every 8 (eight) hours as needed for muscle spasms.    Dispense:  60 tablet    Refill:  0    Order Specific Question:   Supervising Provider    Answer:   Carlota Raspberry, JEFFREY R [[id number]]   PLAN  Tramadol and robaxin for pain tid PRN  Will consider PT if pain not entirely improved  Recommend routine home stretching  Will send urinalysis to rule out UTI  Patient encouraged to call clinic with any questions, comments, or concerns.   Maximiano Coss, NP

## 2020-04-06 NOTE — Patient Instructions (Signed)
° ° ° °  If you have lab work done today you will be contacted with your lab results within the next 2 weeks.  If you have not heard from us then please contact us. The fastest way to get your results is to register for My Chart. ° ° °IF you received an x-ray today, you will receive an invoice from [facility]. Please contact [facility] at [phone] with questions or concerns regarding your invoice.  ° °IF you received labwork today, you will receive an invoice from [facility]. Please contact [facility] at [phone] with questions or concerns regarding your invoice.  ° °Our billing staff will not be able to assist you with questions regarding bills from these companies. ° °You will be contacted with the lab results as soon as they are available. The fastest way to get your results is to activate your My Chart account. Instructions are located on the last page of this paperwork. If you have not heard from us regarding the results in 2 weeks, please contact this office. °  ° ° ° °

## 2020-04-15 ENCOUNTER — Ambulatory Visit: Payer: Managed Care, Other (non HMO) | Admitting: Internal Medicine

## 2020-04-21 ENCOUNTER — Encounter: Payer: Self-pay | Admitting: Internal Medicine

## 2020-04-21 ENCOUNTER — Ambulatory Visit (INDEPENDENT_AMBULATORY_CARE_PROVIDER_SITE_OTHER): Payer: Managed Care, Other (non HMO) | Admitting: Internal Medicine

## 2020-04-21 ENCOUNTER — Other Ambulatory Visit: Payer: Self-pay

## 2020-04-21 VITALS — BP 104/60 | HR 94 | Ht 64.0 in | Wt 164.4 lb

## 2020-04-21 DIAGNOSIS — E119 Type 2 diabetes mellitus without complications: Secondary | ICD-10-CM

## 2020-04-21 DIAGNOSIS — E1165 Type 2 diabetes mellitus with hyperglycemia: Secondary | ICD-10-CM | POA: Diagnosis not present

## 2020-04-21 LAB — POCT GLYCOSYLATED HEMOGLOBIN (HGB A1C): Hemoglobin A1C: 8 % — AB (ref 4.0–5.6)

## 2020-04-21 MED ORDER — LANTUS SOLOSTAR 100 UNIT/ML ~~LOC~~ SOPN: 40.0000 [IU] | PEN_INJECTOR | Freq: Every day | SUBCUTANEOUS | 6 refills | Status: DC

## 2020-04-21 MED ORDER — SITAGLIPTIN PHOSPHATE 100 MG PO TABS: 100.0000 mg | ORAL_TABLET | Freq: Every day | ORAL | 3 refills | Status: DC

## 2020-04-21 NOTE — Progress Notes (Signed)
Name: Sandra Moody  Age/ Sex: 49 y.o., female   MRN/ DOB: [id number], April 25, 1971     PCP: Rutherford Guys, MD   Reason for Endocrinology Evaluation: Type 2 Diabetes Mellitus  Initial Endocrine Consultative Visit: 09/05/2019    PATIENT IDENTIFIER: Ms. Sandra Moody is a 49 y.o. female with a past medical history of asthma. The patient has followed with Endocrinology clinic since 09/05/2019 for consultative assistance with management of her diabetes.  DIABETIC HISTORY:  Ms. Molyneux was diagnosed with DM in 09/2018.  She has been on multiple oral glycemic agents with reported intolerance.   Glipizide caused stomach issues as well as Metformin.  Actos caused hypoglycemia and was discontinued in 07/2019 , she has not been on insulin before. Her hemoglobin A1c has ranged from 6.1% in 08/2019, peaking at 10.3%  in 2020  On her initial visit to our clinic she was not on any oral glycemic agents, and declined any oral medications at the time.  By 12/2019 she presented to her PCP with hypoglycemia and BG is in the 300s patient was started on Januvia at the time.   SUBJECTIVE:   During the last visit (01/09/2020): A1c 8.4 %, We increased Januvia    Today (04/21/2020): Ms. Winterton is here for follow-up on diabetes management She checks her blood sugars 1 times daily. Since her last visit her , she was started on basal insulin and increased her Januvia dose from 50 to 100 mg by her PCP.  The patient has had hypoglycemic episodes since the last clinic visit. She is symptomatic with these episodes    HOME DIABETES REGIMEN:  Januvia 100 mg daily  Lantus 44 units daily     METER DOWNLOAD SUMMARY: Date range evaluated: 9/9-9/22/2021 Average Number Tests/Day = 1 Overall Mean FS Glucose = 125 Standard Deviation = 48  BG Ranges: Low = 65 High = 219   Hypoglycemic Events/30 Days: BG < 50 = 0 Episodes of symptomatic severe hypoglycemia = 0   DIABETIC COMPLICATIONS: Microvascular complications:     Denies: neuropathy , retinopathy,   Last eye exam: Completed 09/2018  Macrovascular complications:    Denies: CAD, PVD, CVA  HISTORY:  Past Medical History:  Past Medical History:  Diagnosis Date  . Acute renal disease   . Asthma   . Bilateral carpal tunnel syndrome 03/11/2019  . Common migraine with intractable migraine 08/29/2018  . Daily headache    (03/08/2017)  . Essential hypertension 02/20/2019  . GERD (gastroesophageal reflux disease)   . H pylori ulcer ~ 08/2016  . History of hiatal hernia   . Leaky heart valve ~ 09/2016  . Migraine    "maybe weekly" (03/08/2017)  . PONV (postoperative nausea and vomiting)   . Type 2 diabetes mellitus with hyperglycemia, without long-term current use of insulin ([place]) 02/20/2019   Past Surgical History:  Past Surgical History:  Procedure Laterality Date  . CARPAL TUNNEL RELEASE Left 12/18/2019   Procedure: CARPAL TUNNEL RELEASE;  Surgeon: [name], MD;  Location: [place] [facility];  Service: Orthopedics;  Laterality: Left;  . CHOLECYSTECTOMY N/A 03/08/2017   Procedure: LAPAROSCOPIC CHOLECYSTECTOMY;  Surgeon: Coralie Keens, MD;  Location: [place];  Service: General;  Laterality: N/A;  . DILATION AND CURETTAGE OF UTERUS    . LAPAROSCOPIC CHOLECYSTECTOMY  03/08/2017  . TRIGGER FINGER RELEASE Left 12/18/2019   Procedure: RELEASE TRIGGER FINGER/A-1 PULLEY;  Surgeon: [name], MD;  Location: [facility];  Service: Orthopedics;  Laterality: Left;  .  TUBAL LIGATION      Social History:  reports that she has never smoked. She has never used smokeless tobacco. She reports previous alcohol use. She reports that she does not use drugs. Family History:  Family History  Problem Relation Age of Onset  . Thyroid disease Mother   . Diabetes Maternal Grandmother   . Heart disease Maternal Grandfather      HOME MEDICATIONS: Allergies as of 04/21/2020      Reactions   Ginger Itching, Swelling   Passion Fruit Flavor  [flavoring Agent] Itching, Swelling   Shellfish Allergy Anaphylaxis   Other    ALLERGIC TO ALL TREE NUTS   Tamiflu [oseltamivir Phosphate]       Medication List       Accurate as of April 21, 2020  7:38 AM. If you have any questions, ask your nurse or doctor.        Accu-Chek Guide test strip Generic drug: glucose blood USE TO CHECK GLUCOSE TWICE DAILY,FASTING AND 2 HOURS AFTER DINNER   ALBUTEROL SULFATE PO Take by mouth.   albuterol (2.5 MG/3ML) 0.083% nebulizer solution Commonly known as: PROVENTIL Take 3 mLs (2.5 mg total) by nebulization every 6 (six) hours as needed for wheezing or shortness of breath.   amLODipine 2.5 MG tablet Commonly known as: NORVASC TAKE 1 TABLET BY MOUTH EVERY DAY   BIOTIN PO Take by mouth.   blood glucose meter kit and supplies Per insurance preference. Use to check glucose twice a day (fasting and 2 hours after dinner)  Dx E11.65   Emgality 120 MG/ML Soaj Generic drug: Galcanezumab-gnlm Inject 240 mg into the skin every 30 (thirty) days.   Emgality 120 MG/ML Soaj Generic drug: Galcanezumab-gnlm Inject 120 mg into the skin every 30 (thirty) days.   EpiPen 2-Pak 0.3 mg/0.3 mL Soaj injection Generic drug: EPINEPHrine Inject 0.3 mg into the muscle as needed (for allergic reaction).   fluticasone 50 MCG/ACT nasal spray Commonly known as: FLONASE USE 1 2 SPRAYS IN EACH NOSTRIL ONCE A DAY   ketoconazole 2 % shampoo Commonly known as: NIZORAL Apply 1 application topically 2 (two) times a week. Apply to affected area lather, leave in place for 5 mins and then rinse off with water   Lantus SoloStar 100 UNIT/ML Solostar Pen Generic drug: insulin glargine Inject 44 Units into the skin daily. What changed: Another medication with the same name was removed. Continue taking this medication, and follow the directions you see here. Changed by: Dorita Sciara, MD   levocetirizine 5 MG tablet Commonly known as: XYZAL 1 2 TABLET IN  THE EVENING ONCE A DAY ORALLY 15   medroxyPROGESTERone 150 MG/ML injection Commonly known as: DEPO-PROVERA   methocarbamol 500 MG tablet Commonly known as: Robaxin Take 1 tablet (500 mg total) by mouth every 8 (eight) hours as needed for muscle spasms.   metroNIDAZOLE 0.75 % vaginal gel Commonly known as: METROGEL Place 1 Applicatorful vaginally at bedtime.   montelukast 10 MG tablet Commonly known as: SINGULAIR Take 10 mg by mouth at bedtime.   olopatadine 0.1 % ophthalmic solution Commonly known as: Patanol Place 1 drop into both eyes 2 (two) times daily.   omeprazole 20 MG capsule Commonly known as: PRILOSEC Take 1 capsule (20 mg total) by mouth 2 (two) times daily before a meal.   Pen Needles 32G X 6 MM Misc Use once a day with lantus injection   pregabalin 100 MG capsule Commonly known as: LYRICA Take 1 capsule (100  mg total) by mouth 3 (three) times daily.   rizatriptan 10 MG tablet Commonly known as: MAXALT TAKE 1 TABLET (10 MG TOTAL) BY MOUTH 3 (THREE) TIMES DAILY AS NEEDED FOR MIGRAINE.   sitaGLIPtin 100 MG tablet Commonly known as: Januvia Take 1 tablet (100 mg total) by mouth daily.   Symbicort 160-4.5 MCG/ACT inhaler Generic drug: budesonide-formoterol INHALE 2 PUFFS INTO THE LUNGS DAILY.   VITAMIN B-12 PO Take by mouth.   VITAMIN D3 PO Take by mouth.   VITAMIN E PO Take by mouth.        OBJECTIVE:   Vital Signs: BP 104/60 (BP Location: Left Arm, Patient Position: Sitting, Cuff Size: Large)   Pulse 94   Ht 5' 4"  (1.626 m)   Wt 164 lb 6.4 oz (74.6 kg)   SpO2 99%   BMI 28.22 kg/m   Wt Readings from Last 3 Encounters:  04/21/20 164 lb 6.4 oz (74.6 kg)  04/06/20 165 lb 12.8 oz (75.2 kg)  03/29/20 163 lb 9.6 oz (74.2 kg)     Exam: General: Pt appears well and is in NAD  Lungs: Clear with good BS bilat with no rales, rhonchi, or wheezes  Heart: RRR with normal S1 and S2 and no gallops; no murmurs; no rub  Abdomen: Normoactive bowel  sounds, soft, nontender, without masses or organomegaly palpable  Extremities: No pretibial edema.   Neuro: MS is good with appropriate affect, pt is alert and Ox3     DM foot exam: 09/05/2019  The skin of the feet is intact without sores or ulcerations. The pedal pulses are 2+ on right and 2+ on left. The sensation is intact to a screening 5.07, 10 gram monofilament bilaterally    DATA REVIEWED:  Lab Results  Component Value Date   HGBA1C 8.0 (A) 04/21/2020   HGBA1C 8.4 (A) 01/05/2020   HGBA1C 6.1 (A) 08/07/2019   Lab Results  Component Value Date   LDLCALC 72 08/07/2019   CREATININE 0.94 03/05/2020   Lab Results  Component Value Date   MICRALBCREAT 11 10/10/2019     Lab Results  Component Value Date   CHOL 124 08/07/2019   HDL 35 (L) 08/07/2019   LDLCALC 72 08/07/2019   TRIG 86 08/07/2019   CHOLHDL 3.5 08/07/2019       Glutamic Acid Decarb Ab 0.0 - 5.0 U/mL <5.0    C-Peptide 1.1 - 4.4 ng/mL 8.3High    Islet Cell Ab Neg:<1:1 Negative      ASSESSMENT / PLAN / RECOMMENDATIONS:   1) Type 2 Diabetes Mellitus, Sub-OPtimally controlled, Without complications - Most recent A1c of 8.0 %. Goal A1c < %.      - Slight improvement in A1c  - We discussed the pharmacokinetics of basal insulin.  - I am going to reduce the dose of lantus  Due to fasting hypoglycemia.  - I have encouraged her to work on cutting snacks, we discussed low carb options for snacks if necessary.  - She feels good on the insulin  - Intolerant to Metformin and Glipizide   MEDICATIONS:  Continue  Januvia 100 mg daily  Decrease Lantus to 40 units daily   EDUCATION / INSTRUCTIONS:  BG monitoring instructions: Patient is instructed to check her blood sugars 2 times a day.  Call [facility] if: BG persistently < 70  . I reviewed the Rule of 15 for the treatment of hypoglycemia in detail with the patient. Literature supplied.    2) Diabetic complications:  Eye:  Does not have known diabetic retinopathy.   Neuro/ Feet: Does not have known diabetic peripheral neuropathy .   Renal: Patient does not have known baseline CKD.   F/U in 4 months  Signed electronically by: Mack Guise, MD  [facility]  [facility] [address] [place], [place] [address] Phone: [phone] FAX: [phone]   CC: Rutherford Guys, MD [address] [place] [address] Phone: [phone]  Fax: [phone]  Return to Endocrinology clinic as below: Future Appointments  Date Time Provider [place]  05/20/2020  7:45 AM Suzzanne Cloud, NP [facility] None  06/14/2020  3:00 PM Wendie Agreste, MD [facility]

## 2020-04-21 NOTE — Patient Instructions (Signed)
-   Decrease Lantus to 40 units daily  - Continue Januvia 100 mg , 1 tablet a day     Choose healthy, lower carb lower calorie snacks: toss salad, vegetables, cottage cheese, peanut butter, low fat cheese / string cheese, lower sodium deli meat, tuna salad or chicken salad     HOW TO TREAT LOW BLOOD SUGARS (Blood sugar LESS THAN 70 MG/DL)  Please follow the RULE OF 15 for the treatment of hypoglycemia treatment (when your (blood sugars are less than 70 mg/dL)    STEP 1: Take 15 grams of carbohydrates when your blood sugar is low, which includes:   3-4 GLUCOSE TABS  OR  3-4 OZ OF JUICE OR REGULAR SODA OR  ONE TUBE OF GLUCOSE GEL     STEP 2: RECHECK blood sugar in 15 MINUTES STEP 3: If your blood sugar is still low at the 15 minute recheck --> then, go back to STEP 1 and treat AGAIN with another 15 grams of carbohydrates.

## 2020-04-26 ENCOUNTER — Other Ambulatory Visit: Payer: Self-pay

## 2020-04-26 ENCOUNTER — Ambulatory Visit (INDEPENDENT_AMBULATORY_CARE_PROVIDER_SITE_OTHER): Payer: Managed Care, Other (non HMO) | Admitting: Family Medicine

## 2020-04-26 ENCOUNTER — Ambulatory Visit (INDEPENDENT_AMBULATORY_CARE_PROVIDER_SITE_OTHER): Payer: Managed Care, Other (non HMO)

## 2020-04-26 ENCOUNTER — Encounter: Payer: Self-pay | Admitting: Family Medicine

## 2020-04-26 VITALS — BP 125/80 | HR 86 | Temp 97.6°F | Ht 64.0 in | Wt 165.0 lb

## 2020-04-26 DIAGNOSIS — M25511 Pain in right shoulder: Secondary | ICD-10-CM

## 2020-04-26 MED ORDER — MELOXICAM 15 MG PO TABS: 15.0000 mg | ORAL_TABLET | Freq: Every day | ORAL | 0 refills | Status: DC

## 2020-04-26 NOTE — Progress Notes (Signed)
9/27/20211:21 PM  Sandra Moody 04-16-1971, 49 y.o., female [id number]  Chief Complaint  Patient presents with  . R shoulder pain since Friday evening    constant pain 6/10 and 10 at times , reports no injuries     HPI:   Patient is a 49 y.o. female with past medical history significant for DM2,HTN, migraines, asthma, GERD, biliary dyskinesia who presents today for RIGHT shoulder pain  For past 3 days having right upper shoulder, neck, and upper arm pain Worse with abduction On Friday it was feeling weak and shaky like if she had worked out heavy Having numbness and tingling upper back Better after voltaren gel and heat pad and methocarbamol - which is helping This happened last month  No inciting events or injury Right handed  Depression screen [facility] 2/9 04/26/2020 04/06/2020 03/05/2020  Decreased Interest 0 0 0  Down, Depressed, Hopeless 0 0 0  PHQ - 2 Score 0 0 0    Fall Risk  04/26/2020 04/06/2020 03/05/2020 02/27/2020 02/13/2020  Falls in the past year? 0 0 0 0 0  Number falls in past yr: 0 0 0 0 0  Injury with Fall? 0 0 0 0 0  Risk for fall due to : - - No Fall Risks - No Fall Risks  Follow up Falls evaluation completed Falls evaluation completed Falls evaluation completed Falls evaluation completed Falls evaluation completed     Allergies  Allergen Reactions  . Ginger Itching and Swelling  . Passion Fruit Flavor [Flavoring Agent] Itching and Swelling  . Shellfish Allergy Anaphylaxis  . Other     ALLERGIC TO ALL TREE NUTS  . Tamiflu [Oseltamivir Phosphate]     Prior to Admission medications   Medication Sig Start Date End Date Taking? Authorizing Provider  ACCU-CHEK GUIDE test strip USE TO CHECK GLUCOSE TWICE DAILY,FASTING AND 2 HOURS AFTER DINNER 07/15/19  Yes Rutherford Guys, MD  albuterol (PROVENTIL) (2.5 MG/3ML) 0.083% nebulizer solution Take 3 mLs (2.5 mg total) by nebulization every 6 (six) hours as needed for wheezing or shortness of breath. 11/13/19  Yes  Wendie Agreste, MD  ALBUTEROL SULFATE PO Take by mouth.   Yes [provider]  amLODipine (NORVASC) 2.5 MG tablet TAKE 1 TABLET BY MOUTH EVERY DAY 11/02/19  Yes Rutherford Guys, MD  BIOTIN PO Take by mouth.   Yes [provider]  blood glucose meter kit and supplies Per insurance preference. Use to check glucose twice a day (fasting and 2 hours after dinner)  Dx E11.65 10/30/18  Yes Rutherford Guys, MD  Cholecalciferol (VITAMIN D3 PO) Take by mouth.   Yes [provider]  Cyanocobalamin (VITAMIN B-12 PO) Take by mouth.   Yes [provider]  EPIPEN 2-PAK 0.3 MG/0.3ML SOAJ injection Inject 0.3 mg into the muscle as needed (for allergic reaction).  05/18/16  Yes [provider]  fluticasone (FLONASE) 50 MCG/ACT nasal spray USE 1 2 SPRAYS IN EACH NOSTRIL ONCE A DAY 10/25/18  Yes [provider]  Galcanezumab-gnlm (EMGALITY) 120 MG/ML SOAJ Inject 240 mg into the skin every 30 (thirty) days. 10/21/19  Yes Suzzanne Cloud, NP  Galcanezumab-gnlm (EMGALITY) 120 MG/ML SOAJ Inject 120 mg into the skin every 30 (thirty) days. 10/21/19  Yes Suzzanne Cloud, NP  insulin glargine (LANTUS SOLOSTAR) 100 UNIT/ML Solostar Pen Inject 40 Units into the skin daily. 04/21/20  Yes Shamleffer, Melanie Crazier, MD  Insulin Pen Needle (PEN NEEDLES) 32G X 6 MM MISC Use  once a day with lantus injection 02/13/20  Yes Rutherford Guys, MD  ketoconazole (NIZORAL) 2 % shampoo Apply 1 application topically 2 (two) times a week. Apply to affected area lather, leave in place for 5 mins and then rinse off with water 01/15/20  Yes Rutherford Guys, MD  levocetirizine (XYZAL) 5 MG tablet 1 2 TABLET IN THE EVENING ONCE A DAY ORALLY 15 11/05/18  Yes [provider]  medroxyPROGESTERone (DEPO-PROVERA) 150 MG/ML injection  08/15/18  Yes [provider]  methocarbamol (ROBAXIN) 500 MG tablet Take 1 tablet (500 mg total) by mouth every 8 (eight) hours as needed for muscle spasms.  04/06/20  Yes Maximiano Coss, NP  metroNIDAZOLE (METROGEL) 0.75 % vaginal gel Place 1 Applicatorful vaginally at bedtime. 03/05/20  Yes Rutherford Guys, MD  montelukast (SINGULAIR) 10 MG tablet Take 10 mg by mouth at bedtime.   Yes [provider]  olopatadine (PATANOL) 0.1 % ophthalmic solution Place 1 drop into both eyes 2 (two) times daily. 12/25/18  Yes Corum, Rex Kras, MD  omeprazole (PRILOSEC) 20 MG capsule Take 1 capsule (20 mg total) by mouth 2 (two) times daily before a meal. 03/05/20  Yes Rutherford Guys, MD  pregabalin (LYRICA) 100 MG capsule Take 1 capsule (100 mg total) by mouth 3 (three) times daily. 03/05/20  Yes Rutherford Guys, MD  rizatriptan (MAXALT) 10 MG tablet TAKE 1 TABLET (10 MG TOTAL) BY MOUTH 3 (THREE) TIMES DAILY AS NEEDED FOR MIGRAINE. 06/02/19  Yes Kathrynn Ducking, MD  sitaGLIPtin (JANUVIA) 100 MG tablet Take 1 tablet (100 mg total) by mouth daily. 04/21/20  Yes Shamleffer, Melanie Crazier, MD  SYMBICORT 160-4.5 MCG/ACT inhaler INHALE 2 PUFFS INTO THE LUNGS DAILY. 05/09/18  Yes Valentina Shaggy, MD  VITAMIN E PO Take by mouth.   Yes [provider]    Past Medical History:  Diagnosis Date  . Acute renal disease   . Asthma   . Bilateral carpal tunnel syndrome 03/11/2019  . Common migraine with intractable migraine 08/29/2018  . Daily headache    (03/08/2017)  . Essential hypertension 02/20/2019  . GERD (gastroesophageal reflux disease)   . H pylori ulcer ~ 08/2016  . History of hiatal hernia   . Leaky heart valve ~ 09/2016  . Migraine    "maybe weekly" (03/08/2017)  . PONV (postoperative nausea and vomiting)   . Type 2 diabetes mellitus with hyperglycemia, without long-term current use of insulin ([place]) 02/20/2019    Past Surgical History:  Procedure Laterality Date  . CARPAL TUNNEL RELEASE Left 12/18/2019   Procedure: CARPAL TUNNEL RELEASE;  Surgeon: [name], MD;  Location: [place];  Service: Orthopedics;  Laterality: Left;  .  CHOLECYSTECTOMY N/A 03/08/2017   Procedure: LAPAROSCOPIC CHOLECYSTECTOMY;  Surgeon: Coralie Keens, MD;  Location: [place];  Service: General;  Laterality: N/A;  . DILATION AND CURETTAGE OF UTERUS    . LAPAROSCOPIC CHOLECYSTECTOMY  03/08/2017  . TRIGGER FINGER RELEASE Left 12/18/2019   Procedure: RELEASE TRIGGER FINGER/A-1 PULLEY;  Surgeon: [name], MD;  Location: [place];  Service: Orthopedics;  Laterality: Left;  . TUBAL LIGATION      Social History   Tobacco Use  . Smoking status: Never Smoker  . Smokeless tobacco: Never Used  Substance Use Topics  . Alcohol use: Not Currently    Comment: 03/08/2017 "might have a drink on a holiday"    Family History  Problem Relation Age of Onset  . Thyroid  disease Mother   . Diabetes Maternal Grandmother   . Heart disease Maternal Grandfather     ROS Per hpi  OBJECTIVE:  Today's Vitals   04/26/20 1309  BP: 125/80  Pulse: 86  Temp: 97.6 F (36.4 C)  SpO2: 97%  Weight: 165 lb (74.8 kg)  Height: 5' 4"  (1.626 m)   Body mass index is 28.32 kg/m.   Physical Exam Vitals and nursing note reviewed.  Constitutional:      Appearance: She is well-developed.  HENT:     Head: Normocephalic and atraumatic.  Eyes:     General: No scleral icterus.    Conjunctiva/sclera: Conjunctivae normal.     Pupils: Pupils are equal, round, and reactive to light.  Pulmonary:     Effort: Pulmonary effort is normal.  Musculoskeletal:     Right shoulder: Swelling and tenderness (biceptal grove>deltoid) present. No bony tenderness. Decreased range of motion (flexion and abduction). Normal strength. Normal pulse.     Left shoulder: Normal.     Cervical back: Full passive range of motion without pain and neck supple. No spinous process tenderness or muscular tenderness.     Thoracic back: Spasms (right scapular border) and tenderness present.  Skin:    General: Skin is warm and dry.  Neurological:     Mental Status: She is alert  and oriented to person, place, and time.     No results found for this or any previous visit (from the past 24 hour(s)).  DG Shoulder Right  Result Date: 04/26/2020 CLINICAL DATA:  Pain and swelling EXAM: RIGHT SHOULDER - 2+ VIEW COMPARISON:  None. FINDINGS: Oblique, Y scapular, and axillary images obtained. No fracture or dislocation. There is slight narrowing of the acromioclavicular joint. There is mild bony overgrowth along the superior acromion. Glenohumeral joint appears normal. No erosive change or intra-articular calcification. Visualized right lung clear. 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 No fracture or dislocation. No erosion. Electronically Signed   By: Lowella Grip III M.D.   On: 04/26/2020 13:46     ASSESSMENT and PLAN  1. Acute pain of right shoulder Discussed supportive measures, new meds r/se/b and RTC precautions. Patient educational handout given. Consider ortho referral.  - DG Shoulder Right  Other orders - meloxicam (MOBIC) 15 MG tablet; Take 1 tablet (15 mg total) by mouth daily.  No follow-ups on file.    Rutherford Guys, MD [facility] at [place], [place] [address] Ph.  [phone] Fax [phone]

## 2020-04-26 NOTE — Patient Instructions (Addendum)
   If you have lab work done today you will be contacted with your lab results within the next 2 weeks.  If you have not heard from us then please contact us. The fastest way to get your results is to register for My Chart.   IF you received an x-ray today, you will receive an invoice from [facility]. Please contact [facility] at [phone] with questions or concerns regarding your invoice.   IF you received labwork today, you will receive an invoice from [facility]. Please contact [facility] at [phone] with questions or concerns regarding your invoice.   Our billing staff will not be able to assist you with questions regarding bills from these companies.  You will be contacted with the lab results as soon as they are available. The fastest way to get your results is to activate your My Chart account. Instructions are located on the last page of this paperwork. If you have not heard from us regarding the results in 2 weeks, please contact this office.     Shoulder Pain Many things can cause shoulder pain, including:  An injury.  Moving the shoulder in the same way again and again (overuse).  Joint pain (arthritis). Pain can come from:  Swelling and irritation (inflammation) of any part of the shoulder.  An injury to the shoulder joint.  An injury to: ? Tissues that connect muscle to bone (tendons). ? Tissues that connect bones to each other (ligaments). ? Bones. Follow these instructions at home: Watch for changes in your symptoms. Let your doctor know about them. Follow these instructions to help with your pain. If you have a sling:  Wear the sling as told by your doctor. Remove it only as told by your doctor.  Loosen the sling if your fingers: ? Tingle. ? Become numb. ? Turn cold and blue.  Keep the sling clean.  If the sling is not waterproof: ? Do not let it get wet. ? Take the sling off when you shower or bathe. Managing pain, stiffness,  and swelling   If told, put ice on the painful area: ? Put ice in a plastic bag. ? Place a towel between your skin and the bag. ? Leave the ice on for 20 minutes, 2-3 times a day. Stop putting ice on if it does not help with the pain.  Squeeze a soft ball or a foam pad as much as possible. This prevents swelling in the shoulder. It also helps to strengthen the arm. General instructions  Take over-the-counter and prescription medicines only as told by your doctor.  Keep all follow-up visits as told by your doctor. This is important. Contact a doctor if:  Your pain gets worse.  Medicine does not help your pain.  You have new pain in your arm, hand, or fingers. Get help right away if:  Your arm, hand, or fingers: ? Tingle. ? Are numb. ? Are swollen. ? Are painful. ? Turn white or blue. Summary  Shoulder pain can be caused by many things. These include injury, moving the shoulder in the same away again and again, and joint pain.  Watch for changes in your symptoms. Let your doctor know about them.  This condition may be treated with a sling, ice, and pain medicine.  Contact your doctor if the pain gets worse or you have new pain. Get help right away if your arm, hand, or fingers tingle or get numb, swollen, or painful.  Keep all follow-up visits   as told by your doctor. This is important. This information is not intended to replace advice given to you by your health care provider. Make sure you discuss any questions you have with your health care provider. Document Revised: 01/29/2018 Document Reviewed: 01/29/2018 Elsevier Patient Education  2020 [employer]  

## 2020-04-27 ENCOUNTER — Other Ambulatory Visit: Payer: Self-pay | Admitting: Family Medicine

## 2020-04-27 NOTE — Telephone Encounter (Signed)
Requested Prescriptions  Pending Prescriptions Disp Refills  . amLODipine (NORVASC) 2.5 MG tablet [Pharmacy Med Name: AMLODIPINE BESYLATE 2.5 MG TAB] 90 tablet 1    Sig: TAKE 1 TABLET BY MOUTH EVERY DAY     Cardiovascular:  Calcium Channel Blockers Passed - 04/27/2020 12:07 AM      Passed - Last BP in normal range    BP Readings from Last 1 Encounters:  04/26/20 125/80         Passed - Valid encounter within last 6 months    Recent Outpatient Visits          Yesterday Acute pain of right shoulder   [facility], Meda Coffee, MD   3 weeks ago Lumbar back pain with radiculopathy affecting right lower extremity   [facility], Richard, NP   4 weeks ago Type 2 diabetes mellitus without complication, without long-term current use of insulin [facility])   [facility], Meda Coffee, MD   1 month ago Type 2 diabetes mellitus with hyperglycemia, without long-term current use of insulin [facility])   [facility], Meda Coffee, MD   2 months ago Type 2 diabetes mellitus with hyperglycemia, with long-term current use of insulin [facility])   [facility], Meda Coffee, MD      Future Appointments            In 1 month Neva Seat Asencion Partridge, MD [facility] at [place], [facility]

## 2020-04-28 DIAGNOSIS — G5622 Lesion of ulnar nerve, left upper limb: Secondary | ICD-10-CM | POA: Insufficient documentation

## 2020-04-29 ENCOUNTER — Telehealth: Payer: Self-pay | Admitting: Family Medicine

## 2020-04-29 NOTE — Telephone Encounter (Signed)
Pt is needing  A work not form when pt was in our office on Monday 04/26/20. Pt is needing it to say if she had and restrictions and that she was in our office for shoulder pain. Pt stated she would like this sent threw MyChart if possible. Please advise.

## 2020-04-29 NOTE — Telephone Encounter (Signed)
Please Advise

## 2020-04-30 NOTE — Telephone Encounter (Signed)
After speaking with a provider we can write a letter that she was seen in our office on 04/26/20 for shoulder pain and she can return to work without restrictions.   I have called pt and informed her of this and she stated that was okay.   Letter drafted and sent via mychart.

## 2020-04-30 NOTE — Telephone Encounter (Signed)
Pt called states she had a missed call from our office around 4:40 was thinking maybe it was follow up from this request   Please advise

## 2020-05-03 ENCOUNTER — Other Ambulatory Visit: Payer: Self-pay

## 2020-05-03 ENCOUNTER — Encounter: Payer: Self-pay | Admitting: Neurology

## 2020-05-03 DIAGNOSIS — R202 Paresthesia of skin: Secondary | ICD-10-CM

## 2020-05-06 ENCOUNTER — Ambulatory Visit (HOSPITAL_COMMUNITY)
Admission: EM | Admit: 2020-05-06 | Discharge: 2020-05-06 | Disposition: A | Discharge status: Home / Self Care | Payer: Managed Care, Other (non HMO) | Attending: Family Medicine | Admitting: Family Medicine

## 2020-05-06 ENCOUNTER — Other Ambulatory Visit: Payer: Self-pay

## 2020-05-06 ENCOUNTER — Encounter (HOSPITAL_COMMUNITY): Payer: Self-pay

## 2020-05-06 DIAGNOSIS — H60502 Unspecified acute noninfective otitis externa, left ear: Secondary | ICD-10-CM | POA: Diagnosis not present

## 2020-05-06 MED ORDER — CIPROFLOXACIN-DEXAMETHASONE 0.3-0.1 % OT SUSP: 4.0000 [drp] | Freq: Two times a day (BID) | OTIC | 0 refills | Status: DC

## 2020-05-06 NOTE — ED Triage Notes (Signed)
Pt presents with left ear pain & irritation internally and externally since yesterday: pt states she is allergic to grass and cut her lawn yesterday.

## 2020-05-06 NOTE — ED Provider Notes (Signed)
[facility]    CSN: [id number] Arrival date & time: 05/06/20  1809      History   Chief Complaint Chief Complaint  Patient presents with  . Otalgia    HPI Sandra Moody is a 49 y.o. female.   HPI  Ear pain since yesterday.  Mild muffled hearing.  No cough cold runny nose symptoms.  No sinus congestion.  No allergies.  No prior history of ear infections or ear problems. Patient states that she has diabetes that is well controlled.  Hypertension is well controlled.  She is compliant with her medical care.  She does have some allergies, she did mow her grass yesterday.  Wonders if this is related.  Past Medical History:  Diagnosis Date  . Acute renal disease   . Asthma   . Bilateral carpal tunnel syndrome 03/11/2019  . Common migraine with intractable migraine 08/29/2018  . Daily headache    (03/08/2017)  . Essential hypertension 02/20/2019  . GERD (gastroesophageal reflux disease)   . H pylori ulcer ~ 08/2016  . History of hiatal hernia   . Leaky heart valve ~ 09/2016  . Migraine    "maybe weekly" (03/08/2017)  . PONV (postoperative nausea and vomiting)   . Type 2 diabetes mellitus with hyperglycemia, without long-term current use of insulin ([place]) 02/20/2019    Patient Active Problem List   Diagnosis Date Noted  . Diabetic polyneuropathy associated with type 2 diabetes mellitus ([place]) 03/05/2020  . Type 2 diabetes mellitus without complication, without long-term current use of insulin ([place]) 09/05/2019  . Trigger finger of left thumb 04/30/2019  . Infection of flexor tendon sheath 04/15/2019  . Carpal tunnel syndrome, bilateral 03/11/2019  . Essential hypertension 02/20/2019  . Type 2 diabetes mellitus with hyperglycemia, without long-term current use of insulin ([place]) 02/20/2019  . Other acute sinusitis 12/25/2018  . Irritable bowel syndrome 12/04/2018  . Migraine with aura 12/04/2018  . Common migraine with intractable migraine 08/29/2018  . Moderate persistent  asthma without complication 89/21/1939  . Seasonal and perennial allergic rhinitis 06/28/2017  . Mild persistent asthma with acute exacerbation 06/28/2017  . Gastroesophageal reflux disease 06/28/2017  . Biliary dyskinesia 03/08/2017  . Asthma 03/02/2016  . Asthma with acute exacerbation 10/04/2015  . Other and unspecified ovarian cyst 06/17/2012    Past Surgical History:  Procedure Laterality Date  . CARPAL TUNNEL RELEASE Left 12/18/2019   Procedure: CARPAL TUNNEL RELEASE;  Surgeon: [name], MD;  Location: [facility];  Service: Orthopedics;  Laterality: Left;  . CHOLECYSTECTOMY N/A 03/08/2017   Procedure: LAPAROSCOPIC CHOLECYSTECTOMY;  Surgeon: Coralie Keens, MD;  Location: [facility];  Service: General;  Laterality: N/A;  . DILATION AND CURETTAGE OF UTERUS    . LAPAROSCOPIC CHOLECYSTECTOMY  03/08/2017  . TRIGGER FINGER RELEASE Left 12/18/2019   Procedure: RELEASE TRIGGER FINGER/A-1 PULLEY;  Surgeon: [name], MD;  Location: [facility];  Service: Orthopedics;  Laterality: Left;  . TUBAL LIGATION      OB History    Gravida  5   Para  3   Term  3   Preterm  0   AB  2   Living        SAB  1   TAB  1   Ectopic  0   Multiple      Live Births               Home Medications    Prior to Admission medications  Medication Sig Start Date End Date Taking? Authorizing Provider  ACCU-CHEK GUIDE test strip USE TO CHECK GLUCOSE TWICE DAILY,FASTING AND 2 HOURS AFTER DINNER 07/15/19   Rutherford Guys, MD  albuterol (PROVENTIL) (2.5 MG/3ML) 0.083% nebulizer solution Take 3 mLs (2.5 mg total) by nebulization every 6 (six) hours as needed for wheezing or shortness of breath. 11/13/19   Wendie Agreste, MD  ALBUTEROL SULFATE PO Take by mouth.    [provider]  amLODipine (NORVASC) 2.5 MG tablet TAKE 1 TABLET BY MOUTH EVERY DAY 04/27/20   Rutherford Guys, MD  BIOTIN PO Take by mouth.    [provider]  blood glucose  meter kit and supplies Per insurance preference. Use to check glucose twice a day (fasting and 2 hours after dinner)  Dx E11.65 10/30/18   Rutherford Guys, MD  Cholecalciferol (VITAMIN D3 PO) Take by mouth.    [provider]  ciprofloxacin-dexamethasone (CIPRODEX) OTIC suspension Place 4 drops into the left ear 2 (two) times daily. 05/06/20   Raylene Everts, MD  Cyanocobalamin (VITAMIN B-12 PO) Take by mouth.    [provider]  EPIPEN 2-PAK 0.3 MG/0.3ML SOAJ injection Inject 0.3 mg into the muscle as needed (for allergic reaction).  05/18/16   [provider]  fluticasone (FLONASE) 50 MCG/ACT nasal spray USE 1 2 SPRAYS IN EACH NOSTRIL ONCE A DAY 10/25/18   [provider]  Galcanezumab-gnlm (EMGALITY) 120 MG/ML SOAJ Inject 240 mg into the skin every 30 (thirty) days. 10/21/19   Suzzanne Cloud, NP  Galcanezumab-gnlm (EMGALITY) 120 MG/ML SOAJ Inject 120 mg into the skin every 30 (thirty) days. 10/21/19   Suzzanne Cloud, NP  insulin glargine (LANTUS SOLOSTAR) 100 UNIT/ML Solostar Pen Inject 40 Units into the skin daily. 04/21/20   Shamleffer, Melanie Crazier, MD  Insulin Pen Needle (PEN NEEDLES) 32G X 6 MM MISC Use once a day with lantus injection 02/13/20   Rutherford Guys, MD  ketoconazole (NIZORAL) 2 % shampoo Apply 1 application topically 2 (two) times a week. Apply to affected area lather, leave in place for 5 mins and then rinse off with water 01/15/20   Rutherford Guys, MD  levocetirizine (XYZAL) 5 MG tablet 1 2 TABLET IN THE EVENING ONCE A DAY ORALLY 15 11/05/18   [provider]  medroxyPROGESTERone (DEPO-PROVERA) 150 MG/ML injection  08/15/18   [provider]  meloxicam (MOBIC) 15 MG tablet Take 1 tablet (15 mg total) by mouth daily. 04/26/20   Rutherford Guys, MD  methocarbamol (ROBAXIN) 500 MG tablet Take 1 tablet (500 mg total) by mouth every 8 (eight) hours as needed for muscle spasms. 04/06/20   Maximiano Coss, NP  metroNIDAZOLE  (METROGEL) 0.75 % vaginal gel Place 1 Applicatorful vaginally at bedtime. 03/05/20   Rutherford Guys, MD  montelukast (SINGULAIR) 10 MG tablet Take 10 mg by mouth at bedtime.    [provider]  olopatadine (PATANOL) 0.1 % ophthalmic solution Place 1 drop into both eyes 2 (two) times daily. 12/25/18   Corum, Rex Kras, MD  omeprazole (PRILOSEC) 20 MG capsule Take 1 capsule (20 mg total) by mouth 2 (two) times daily before a meal. 03/05/20   Rutherford Guys, MD  pregabalin (LYRICA) 100 MG capsule Take 1 capsule (100 mg total) by mouth 3 (three) times daily. 03/05/20   Rutherford Guys, MD  rizatriptan (MAXALT) 10 MG tablet TAKE 1 TABLET (10 MG TOTAL) BY MOUTH 3 (THREE) TIMES DAILY  AS NEEDED FOR MIGRAINE. 06/02/19   Kathrynn Ducking, MD  sitaGLIPtin (JANUVIA) 100 MG tablet Take 1 tablet (100 mg total) by mouth daily. 04/21/20   Shamleffer, Melanie Crazier, MD  SYMBICORT 160-4.5 MCG/ACT inhaler INHALE 2 PUFFS INTO THE LUNGS DAILY. 05/09/18   Valentina Shaggy, MD  VITAMIN E PO Take by mouth.    [provider]    Family History Family History  Problem Relation Age of Onset  . Thyroid disease Mother   . Diabetes Maternal Grandmother   . Heart disease Maternal Grandfather     Social History Social History   Tobacco Use  . Smoking status: Never Smoker  . Smokeless tobacco: Never Used  Vaping Use  . Vaping Use: Never used  Substance Use Topics  . Alcohol use: Not Currently    Comment: 03/08/2017 "might have a drink on a holiday"  . Drug use: No     Allergies   Ginger, Passion fruit flavor [flavoring agent], Shellfish allergy, Other, and Tamiflu [oseltamivir phosphate]   Review of Systems Review of Systems  See HPI Physical Exam Triage Vital Signs ED Triage Vitals  Enc Vitals Group     BP 05/06/20 1911 (!) 147/93     Pulse Rate 05/06/20 1911 84     Resp 05/06/20 1911 18     Temp 05/06/20 1911 98.7 F (37.1 C)     Temp Source 05/06/20 1911 Oral     SpO2 05/06/20  1911 100 %     Weight --      Height --      Head Circumference --      Peak Flow --      Pain Score 05/06/20 1909 5     Pain Loc --      Pain Edu? --      Excl. in [place]? --    No data found.  Updated Vital Signs BP (!) 147/93 (BP Location: Right Arm)   Pulse 84   Temp 98.7 F (37.1 C) (Oral)   Resp 18   SpO2 100%       Physical Exam Constitutional:      General: She is not in acute distress.    Appearance: She is well-developed.  HENT:     Head: Normocephalic and atraumatic.     Right Ear: Tympanic membrane, ear canal and external ear normal.     Left Ear: Tympanic membrane normal.     Ears:     Comments: Canal is erythematous.  Pain with traction of pinna.  Pain with pressure on tragus.  Pain behind ear but no palpable mass, redness, swelling, adenopathy    Mouth/Throat:     Comments: Mask is in place Eyes:     Conjunctiva/sclera: Conjunctivae normal.     Pupils: Pupils are equal, round, and reactive to light.  Cardiovascular:     Rate and Rhythm: Normal rate.  Pulmonary:     Effort: Pulmonary effort is normal. No respiratory distress.  Abdominal:     General: There is distension.     Palpations: Abdomen is soft.  Musculoskeletal:        General: Normal range of motion.     Cervical back: Normal range of motion.  Skin:    General: Skin is warm and dry.  Neurological:     Mental Status: She is alert.  Psychiatric:        Behavior: Behavior normal.      UC Treatments / Results  Labs (all labs  ordered are listed, but only abnormal results are displayed) Labs Reviewed - No data to display  EKG   Radiology No results found.  Procedures Procedures (including critical care time)  Medications Ordered in UC Medications - No data to display  Initial Impression / Assessment and Plan / UC Course  I have reviewed the triage vital signs and the nursing notes.  Pertinent labs & imaging results that were available during my care of the patient were reviewed  by me and considered in my medical decision making (see chart for details).     Otitis externa by exam.  Will treat with antibiotics/cortisone drops.  Patient has pain medicine at home.  Is advised to use a steroid nasal spray if she has severe congestion.  Will return as needed Final Clinical Impressions(s) / UC Diagnoses   Final diagnoses:  Acute otitis externa of left ear, unspecified type     Discharge Instructions     Use ear drops See ENT if not better by [facility]   ED Prescriptions    Medication Sig Dispense Auth. Provider   ciprofloxacin-dexamethasone (CIPRODEX) OTIC suspension Place 4 drops into the left ear 2 (two) times daily. 7.5 mL Raylene Everts, MD     PDMP not reviewed this encounter.   Raylene Everts, MD 05/06/20 [phone]

## 2020-05-06 NOTE — Discharge Instructions (Addendum)
Use ear drops See ENT if not better by [facility]

## 2020-05-09 ENCOUNTER — Other Ambulatory Visit: Payer: Self-pay

## 2020-05-09 ENCOUNTER — Other Ambulatory Visit: Payer: Self-pay | Admitting: Family Medicine

## 2020-05-09 ENCOUNTER — Encounter (HOSPITAL_COMMUNITY): Payer: Self-pay

## 2020-05-09 ENCOUNTER — Ambulatory Visit (HOSPITAL_COMMUNITY)
Admission: EM | Admit: 2020-05-09 | Discharge: 2020-05-09 | Disposition: A | Discharge status: Home / Self Care | Payer: Managed Care, Other (non HMO) | Attending: Family Medicine | Admitting: Family Medicine

## 2020-05-09 DIAGNOSIS — L509 Urticaria, unspecified: Secondary | ICD-10-CM | POA: Diagnosis not present

## 2020-05-09 MED ORDER — CLOBETASOL PROPIONATE 0.05 % EX OINT: 1.0000 "application " | TOPICAL_OINTMENT | Freq: Two times a day (BID) | CUTANEOUS | 0 refills | Status: DC | PRN

## 2020-05-09 NOTE — ED Triage Notes (Signed)
Pt present rash that has spreaded on her back,legs, and arms. The rash is itching and she noticed two days ago.

## 2020-05-09 NOTE — Telephone Encounter (Signed)
Requested Prescriptions  Pending Prescriptions Disp Refills  . ACCU-CHEK GUIDE test strip [Pharmacy Med Name: ACCU-CHEK GUIDE TEST STRIP] 100 strip 5    Sig: USE TO CHECK GLUCOSE TWICE DAILY,FASTING AND 2 HOURS AFTER DINNER     Endocrinology: Diabetes - Testing Supplies Passed - 05/09/2020 12:59 AM      Passed - Valid encounter within last 12 months    Recent Outpatient Visits          1 week ago Acute pain of right shoulder   [facility], Meda Coffee, MD   1 month ago Lumbar back pain with radiculopathy affecting right lower extremity   [facility], Richard, NP   1 month ago Type 2 diabetes mellitus without complication, without long-term current use of insulin [facility])   [facility], Meda Coffee, MD   2 months ago Type 2 diabetes mellitus with hyperglycemia, without long-term current use of insulin [facility])   [facility], Meda Coffee, MD   2 months ago Type 2 diabetes mellitus with hyperglycemia, with long-term current use of insulin [facility])   [facility], Meda Coffee, MD      Future Appointments            Tomorrow Just, Azalee Course, FNP [facility] at [place], [facility]   In 1 month Neva Seat Asencion Partridge, MD Primary Care at [place], [facility]

## 2020-05-10 ENCOUNTER — Ambulatory Visit (INDEPENDENT_AMBULATORY_CARE_PROVIDER_SITE_OTHER): Payer: Managed Care, Other (non HMO) | Admitting: Family Medicine

## 2020-05-10 ENCOUNTER — Encounter: Payer: Self-pay | Admitting: Family Medicine

## 2020-05-10 ENCOUNTER — Telehealth: Payer: Self-pay

## 2020-05-10 VITALS — BP 126/81 | HR 92 | Temp 97.8°F | Ht 64.0 in | Wt 163.0 lb

## 2020-05-10 DIAGNOSIS — E119 Type 2 diabetes mellitus without complications: Secondary | ICD-10-CM | POA: Diagnosis not present

## 2020-05-10 DIAGNOSIS — H60392 Other infective otitis externa, left ear: Secondary | ICD-10-CM | POA: Diagnosis not present

## 2020-05-10 DIAGNOSIS — L509 Urticaria, unspecified: Secondary | ICD-10-CM | POA: Diagnosis not present

## 2020-05-10 MED ORDER — LIDOCAINE 5 % EX OINT: 1.0000 "application " | TOPICAL_OINTMENT | CUTANEOUS | 0 refills | Status: DC | PRN

## 2020-05-10 NOTE — Patient Instructions (Addendum)
Lidocaine ointment ordered for injection site pain Follow up if you need any medication refills Continue taking Ear drops as ordered     If you have lab work done today you will be contacted with your lab results within the next 2 weeks.  If you have not heard from [place] then please contact us. The fastest way to get your results is to register for My Chart.   IF you received an x-ray today, you will receive an invoice from [facility]. Please contact [facility] at [phone] with questions or concerns regarding your invoice.   IF you received labwork today, you will receive an invoice from [place]. Please contact [facility] at [phone] with questions or concerns regarding your invoice.   Our billing staff will not be able to assist you with questions regarding bills from these companies.  You will be contacted with the lab results as soon as they are available. The fastest way to get your results is to activate your My Chart account. Instructions are located on the last page of this paperwork. If you have not heard from [place] regarding the results in 2 weeks, please contact this office.     Lidocaine jelly What is this medicine? LIDOCAINE (LYE doe kane) is an anesthetic. It causes loss of feeling in the skin and surrounding tissues. It is used to prevent and to treat pain from some procedures. This medicine may be used for other purposes; ask your health care provider or pharmacist if you have questions. COMMON BRAND NAME(S): 7T Lido, Anestacon, Astero, Glydo, LidoRx, Tranzarel, Xylocaine What should I tell my health care provider before I take this medicine? They need to know if you have any of these conditions:  infected, open or damaged skin  an unusual or allergic reaction to lidocaine, other medicines, foods, dyes, or preservatives  pregnant or trying to get pregnant  breast-feeding How should I use this medicine? This medicine is applied to the skin or  mucous membranes using finger tips or cotton swabs. It may be applied by a health care professional before a procedure to numb the area. It may also be applied to hemorrhoids for relief of pain. Follow the directions on the prescription label. Do not use more often than directed. Talk to your pediatrician regarding the use of this medicine in children. While this drug may be prescribed for selected conditions, precautions do apply. Overdosage: If you think you have taken too much of this medicine contact a poison control center or emergency room at once. NOTE: This medicine is only for you. Do not share this medicine with others. What if I miss a dose? This does not apply. What may interact with this medicine?  medicines to control heart rhythm. Do not use any other skin products on the affected area without asking your doctor or health care professional. This list may not describe all possible interactions. Give your health care provider a list of all the medicines, herbs, non-prescription drugs, or dietary supplements you use. Also tell them if you smoke, drink alcohol, or use illegal drugs. Some items may interact with your medicine. What should I watch for while using this medicine? Be careful to avoid injury while the area is numb and you are not aware of pain. If this medicine is used in the mouth or throat, do not chew gum or eat food for at least one hour. If the area is still numb, you may choke or bite your tongue or cheek if you try  to chew or swallow. Also, you may not feel pain from hot foods or drinks. Do not apply this medicine to areas of skin that are infected, open, or damaged. This may increase the amount of medicine that passes through your skin and increase the risk of serious side effects. What side effects may I notice from receiving this medicine? Side effects that you should report to your doctor or health care professional as soon as possible:  allergic reactions like skin  rash, itching or hives, swelling of the face, lips, or tongue  breathing problems  chest pain, continued irregular heartbeats  headache  seizures  trembling, shaking  unusually weak or tired Side effects that usually do not require medical attention (report to your doctor or health care professional if they continue or are bothersome):  localized numbness This list may not describe all possible side effects. Call your doctor for medical advice about side effects. You may report side effects to FDA at 1-800-FDA-1088. Where should I keep my medicine? Keep out of reach of children. Store at room temperature between 15 and 30 degrees C (59 and 86 degrees F). Do not freeze. Throw away any unused medicine after the expiration date. NOTE: This sheet is a summary. It may not cover all possible information. If you have questions about this medicine, talk to your doctor, pharmacist, or health care provider.  2020 Elsevier/Gold Standard (2007-10-03 10:54:13)  Health Maintenance, Female Adopting a healthy lifestyle and getting preventive care are important in promoting health and wellness. Ask your health care provider about:  The right schedule for you to have regular tests and exams.  Things you can do on your own to prevent diseases and keep yourself healthy. What should I know about diet, weight, and exercise? Eat a healthy diet   Eat a diet that includes plenty of vegetables, fruits, low-fat dairy products, and lean protein.  Do not eat a lot of foods that are high in solid fats, added sugars, or sodium. Maintain a healthy weight Body mass index (BMI) is used to identify weight problems. It estimates body fat based on height and weight. Your health care provider can help determine your BMI and help you achieve or maintain a healthy weight. Get regular exercise Get regular exercise. This is one of the most important things you can do for your health. Most adults should:  Exercise  for at least 150 minutes each week. The exercise should increase your heart rate and make you sweat (moderate-intensity exercise).  Do strengthening exercises at least twice a week. This is in addition to the moderate-intensity exercise.  Spend less time sitting. Even light physical activity can be beneficial. Watch cholesterol and blood lipids Have your blood tested for lipids and cholesterol at 47 years of age, then have this test every 5 years. Have your cholesterol levels checked more often if:  Your lipid or cholesterol levels are high.  You are older than 47 years of age.  You are at high risk for heart disease. What should I know about cancer screening? Depending on your health history and family history, you may need to have cancer screening at various ages. This may include screening for:  Breast cancer.  Cervical cancer.  Colorectal cancer.  Skin cancer.  Lung cancer. What should I know about heart disease, diabetes, and high blood pressure? Blood pressure and heart disease  High blood pressure causes heart disease and increases the risk of stroke. This is more likely to develop in people  who have high blood pressure readings, are of African descent, or are overweight.  Have your blood pressure checked: ? Every 3-5 years if you are 54-21 years of age. ? Every year if you are 47 years old or older. Diabetes Have regular diabetes screenings. This checks your fasting blood sugar level. Have the screening done:  Once every three years after age 36 if you are at a normal weight and have a low risk for diabetes.  More often and at a younger age if you are overweight or have a high risk for diabetes. What should I know about preventing infection? Hepatitis B If you have a higher risk for hepatitis B, you should be screened for this virus. Talk with your health care provider to find out if you are at risk for hepatitis B infection. Hepatitis C Testing is recommended  for:  Everyone born from [birth year] through 1963.  Anyone with known risk factors for hepatitis C. Sexually transmitted infections (STIs)  Get screened for STIs, including gonorrhea and chlamydia, if: ? You are sexually active and are younger than 47 years of age. ? You are older than 47 years of age and your health care provider tells you that you are at risk for this type of infection. ? Your sexual activity has changed since you were last screened, and you are at increased risk for chlamydia or gonorrhea. Ask your health care provider if you are at risk.  Ask your health care provider about whether you are at high risk for HIV. Your health care provider may recommend a prescription medicine to help prevent HIV infection. If you choose to take medicine to prevent HIV, you should first get tested for HIV. You should then be tested every 3 months for as long as you are taking the medicine. Pregnancy  If you are about to stop having your period (premenopausal) and you may become pregnant, seek counseling before you get pregnant.  Take 400 to 800 micrograms (mcg) of folic acid every day if you become pregnant.  Ask for birth control (contraception) if you want to prevent pregnancy. Osteoporosis and menopause Osteoporosis is a disease in which the bones lose minerals and strength with aging. This can result in bone fractures. If you are 18 years old or older, or if you are at risk for osteoporosis and fractures, ask your health care provider if you should:  Be screened for bone loss.  Take a calcium or vitamin D supplement to lower your risk of fractures.  Be given hormone replacement therapy (HRT) to treat symptoms of menopause. Follow these instructions at home: Lifestyle  Do not use any products that contain nicotine or tobacco, such as cigarettes, e-cigarettes, and chewing tobacco. If you need help quitting, ask your health care provider.  Do not use street drugs.  Do not share  needles.  Ask your health care provider for help if you need support or information about quitting drugs. Alcohol use  Do not drink alcohol if: ? Your health care provider tells you not to drink. ? You are pregnant, may be pregnant, or are planning to become pregnant.  If you drink alcohol: ? Limit how much you use to 0-1 drink a day. ? Limit intake if you are breastfeeding.  Be aware of how much alcohol is in your drink. In the [place] one drink equals one 12 oz bottle of beer (355 mL), one 5 oz glass of wine (148 mL), or one 1 oz glass of hard liquor (  44 mL). General instructions  Schedule regular health, dental, and eye exams.  Stay current with your vaccines.  Tell your health care provider if: ? You often feel depressed. ? You have ever been abused or do not feel safe at home. Summary  Adopting a healthy lifestyle and getting preventive care are important in promoting health and wellness.  Follow your health care provider's instructions about healthy diet, exercising, and getting tested or screened for diseases.  Follow your health care provider's instructions on monitoring your cholesterol and blood pressure. This information is not intended to replace advice given to you by your health care provider. Make sure you discuss any questions you have with your health care provider. Document Revised: 07/10/2018 Document Reviewed: 07/10/2018 Elsevier Patient Education  2020 Elsevier Inc.  Otitis Externa  Otitis externa is an infection of the outer ear canal. The outer ear canal is the area between the outside of the ear and the eardrum. Otitis externa is sometimes called swimmer's ear. What are the causes? Common causes of this condition include:  Swimming in dirty water.  Moisture in the ear.  An injury to the inside of the ear.  An object stuck in the ear.  A cut or scrape on the outside of the ear. What increases the risk? You are more likely to get this condition  if you go swimming often. What are the signs or symptoms?  Itching in the ear. This is often the first symptom.  Swelling of the ear.  Redness in the ear.  Ear pain. The pain may get worse when you pull on your ear.  Pus coming from the ear. How is this treated? This condition may be treated with:  Antibiotic ear drops. These are often given for 10-14 days.  Medicines to reduce itching and swelling. Follow these instructions at home:  If you were given antibiotic ear drops, use them as told by your doctor. Do not stop using them even if your condition gets better.  Take over-the-counter and prescription medicines only as told by your doctor.  Avoid getting water in your ears as told by your doctor. You may be told to avoid swimming or water sports for a few days.  Keep all follow-up visits as told by your doctor. This is important. How is this prevented?  Keep your ears dry. Use the corner of a towel to dry your ears after you swim or bathe.  Try not to scratch or put things in your ear. Doing these things makes it easier for germs to grow in your ear.  Avoid swimming in lakes, dirty water, or pools that may not have the right amount of a chemical called chlorine. Contact a doctor if:  You have a fever.  Your ear is still red, swollen, or painful after 3 days.  You still have pus coming from your ear after 3 days.  Your redness, swelling, or pain gets worse.  You have a really bad headache.  You have redness, swelling, pain, or tenderness behind your ear. Summary  Otitis externa is an infection of the outer ear canal.  Symptoms include pain, redness, and swelling of the ear.  If you were given antibiotic ear drops, use them as told by your doctor. Do not stop using them even if your condition gets better.  Try not to scratch or put things in your ear. This information is not intended to replace advice given to you by your health care provider. Make sure you  discuss any questions you have with your health care provider. Document Revised: 12/21/2017 Document Reviewed: 12/21/2017 Elsevier Patient Education  2020 [employer].

## 2020-05-10 NOTE — Telephone Encounter (Signed)
A PA for [employer] has been sent via [facility] Key: [id number] - Rx #: [id number] Status pending

## 2020-05-10 NOTE — Progress Notes (Signed)
Acute Office Visit  Subjective:    Patient ID: Sandra Moody, female    DOB: 1971/06/27, 49 y.o.   MRN: [id number]  Chief Complaint  Patient presents with  . Otitis Externa    using drops in L ear , still feeling a throbbing pain     HPI Patient is a 49 y.o. female with past medical history significant for DM2,HTN, migraines, asthma, GERD, biliary dyskinesia who presents today for follow up from Otitis Media and Hives reaction this past weekend.   Thursday to Urgent Care for ear pain and was diagnosed with otitis externa Was ordered ear drops and started those Thursday evening She is still having occasional ear pain, but no ringing, changes in hearing and the pain is improving. Her ear pain worsened her migraines, but no issues at this time now that ear pain is resolving.  Generalized Hives yesterday, went to urgent care, benadryl was given and they resolved. Unsure of the cause. No signs of rash at this appointment.  She has been having issues with Frozen shoulder and wrist pain from work Needs nerve conduction study post left wrist surgery May, which is being handled by ortho.  She notes insulin is going well, but is complaining of site pain. Her mother had mentioned to her lidocaine ointment prior to injections and she is wondering is that's an option. She denies any instances of hypo/hyper glycemia. Her FBG are in the 80-90's.   Past Medical History:  Diagnosis Date  . Acute renal disease   . Asthma   . Bilateral carpal tunnel syndrome 03/11/2019  . Common migraine with intractable migraine 08/29/2018  . Daily headache    (03/08/2017)  . Essential hypertension 02/20/2019  . GERD (gastroesophageal reflux disease)   . H pylori ulcer ~ 08/2016  . History of hiatal hernia   . Leaky heart valve ~ 09/2016  . Migraine    "maybe weekly" (03/08/2017)  . PONV (postoperative nausea and vomiting)   . Type 2 diabetes mellitus with hyperglycemia, without long-term current use of  insulin ([place]) 02/20/2019    Past Surgical History:  Procedure Laterality Date  . CARPAL TUNNEL RELEASE Left 12/18/2019   Procedure: CARPAL TUNNEL RELEASE;  Surgeon: [name], MD;  Location: [facility];  Service: Orthopedics;  Laterality: Left;  . CHOLECYSTECTOMY N/A 03/08/2017   Procedure: LAPAROSCOPIC CHOLECYSTECTOMY;  Surgeon: Coralie Keens, MD;  Location: [facility];  Service: General;  Laterality: N/A;  . DILATION AND CURETTAGE OF UTERUS    . LAPAROSCOPIC CHOLECYSTECTOMY  03/08/2017  . TRIGGER FINGER RELEASE Left 12/18/2019   Procedure: RELEASE TRIGGER FINGER/A-1 PULLEY;  Surgeon: [name], MD;  Location: [facility];  Service: Orthopedics;  Laterality: Left;  . TUBAL LIGATION      Family History  Problem Relation Age of Onset  . Thyroid disease Mother   . Diabetes Maternal Grandmother   . Heart disease Maternal Grandfather     Social History   Tobacco Use  . Smoking status: Never Smoker  . Smokeless tobacco: Never Used  Vaping Use  . Vaping Use: Never used  Substance Use Topics  . Alcohol use: Not Currently    Comment: 03/08/2017 "might have a drink on a holiday"  . Drug use: No     Outpatient Medications Prior to Visit  Medication Sig Dispense Refill  . ACCU-CHEK GUIDE test strip USE TO CHECK GLUCOSE TWICE DAILY,FASTING AND 2 HOURS AFTER DINNER 100 strip 5  . albuterol (PROVENTIL) (2.5 MG/3ML)  0.083% nebulizer solution Take 3 mLs (2.5 mg total) by nebulization every 6 (six) hours as needed for wheezing or shortness of breath. 75 mL 1  . ALBUTEROL SULFATE PO Take by mouth.    Marland Kitchen amLODipine (NORVASC) 2.5 MG tablet TAKE 1 TABLET BY MOUTH EVERY DAY 90 tablet 1  . BIOTIN PO Take by mouth.    . blood glucose meter kit and supplies Per insurance preference. Use to check glucose twice a day (fasting and 2 hours after dinner)  Dx E11.65 1 each 5  . Cholecalciferol (VITAMIN D3 PO) Take by mouth.    . ciprofloxacin-dexamethasone (CIPRODEX) OTIC  suspension Place 4 drops into the left ear 2 (two) times daily. 7.5 mL 0  . clobetasol ointment (TEMOVATE) 2.50 % Apply 1 application topically 2 (two) times daily as needed. For itchy rash as needed 30 g 0  . Cyanocobalamin (VITAMIN B-12 PO) Take by mouth.    . EPIPEN 2-PAK 0.3 MG/0.3ML SOAJ injection Inject 0.3 mg into the muscle as needed (for allergic reaction).   2  . fluticasone (FLONASE) 50 MCG/ACT nasal spray USE 1 2 SPRAYS IN EACH NOSTRIL ONCE A DAY    . Galcanezumab-gnlm (EMGALITY) 120 MG/ML SOAJ Inject 240 mg into the skin every 30 (thirty) days. 2 pen 0  . Galcanezumab-gnlm (EMGALITY) 120 MG/ML SOAJ Inject 120 mg into the skin every 30 (thirty) days. 1 pen 11  . insulin glargine (LANTUS SOLOSTAR) 100 UNIT/ML Solostar Pen Inject 40 Units into the skin daily. 15 mL 6  . Insulin Pen Needle (PEN NEEDLES) 32G X 6 MM MISC Use once a day with lantus injection 100 each 2  . ketoconazole (NIZORAL) 2 % shampoo Apply 1 application topically 2 (two) times a week. Apply to affected area lather, leave in place for 5 mins and then rinse off with water 120 mL 5  . levocetirizine (XYZAL) 5 MG tablet 1 2 TABLET IN THE EVENING ONCE A DAY ORALLY 15    . medroxyPROGESTERone (DEPO-PROVERA) 150 MG/ML injection     . meloxicam (MOBIC) 15 MG tablet Take 1 tablet (15 mg total) by mouth daily. 30 tablet 0  . methocarbamol (ROBAXIN) 500 MG tablet Take 1 tablet (500 mg total) by mouth every 8 (eight) hours as needed for muscle spasms. 60 tablet 0  . montelukast (SINGULAIR) 10 MG tablet Take 10 mg by mouth at bedtime.    Marland Kitchen olopatadine (PATANOL) 0.1 % ophthalmic solution Place 1 drop into both eyes 2 (two) times daily. 5 mL 12  . omeprazole (PRILOSEC) 20 MG capsule Take 1 capsule (20 mg total) by mouth 2 (two) times daily before a meal. 60 capsule 3  . pregabalin (LYRICA) 100 MG capsule Take 1 capsule (100 mg total) by mouth 3 (three) times daily. 90 capsule 3  . rizatriptan (MAXALT) 10 MG tablet TAKE 1 TABLET (10 MG  TOTAL) BY MOUTH 3 (THREE) TIMES DAILY AS NEEDED FOR MIGRAINE. 6 tablet 6  . sitaGLIPtin (JANUVIA) 100 MG tablet Take 1 tablet (100 mg total) by mouth daily. 90 tablet 3  . SYMBICORT 160-4.5 MCG/ACT inhaler INHALE 2 PUFFS INTO THE LUNGS DAILY. 10.2 Inhaler 0  . VITAMIN E PO Take by mouth.    . metroNIDAZOLE (METROGEL) 0.75 % vaginal gel Place 1 Applicatorful vaginally at bedtime. (Patient not taking: Reported on 05/10/2020) 70 g 0   No facility-administered medications prior to visit.    Allergies  Allergen Reactions  . Ginger Itching and Swelling  . Passion Fruit  Flavor [Flavoring Agent] Itching and Swelling  . Shellfish Allergy Anaphylaxis  . Other     ALLERGIC TO ALL TREE NUTS  . Tamiflu [Oseltamivir Phosphate]     Review of Systems  Constitutional: Negative for activity change, appetite change, fatigue and fever.  HENT: Positive for ear pain. Negative for congestion, dental problem, hearing loss, rhinorrhea, sinus pain, sore throat, tinnitus and trouble swallowing.   Respiratory: Negative for cough, shortness of breath and wheezing.   Cardiovascular: Negative for chest pain, palpitations and leg swelling.  Gastrointestinal: Negative for abdominal pain, constipation, diarrhea, nausea and vomiting.  Musculoskeletal: Negative for arthralgias, back pain and myalgias.  Skin: Negative for pallor, rash and wound.  Neurological: Negative for weakness, light-headedness, numbness and headaches.       Objective:    Physical Exam Constitutional:      General: She is not in acute distress.    Appearance: Normal appearance. She is not ill-appearing.  HENT:     Right Ear: Tympanic membrane, ear canal and external ear normal.     Left Ear: Tympanic membrane and external ear normal.     Mouth/Throat:     Mouth: Mucous membranes are moist.     Pharynx: No oropharyngeal exudate or posterior oropharyngeal erythema.  Cardiovascular:     Rate and Rhythm: Normal rate and regular rhythm.      Pulses: Normal pulses.     Heart sounds: Normal heart sounds.  Pulmonary:     Effort: Pulmonary effort is normal. No respiratory distress.     Breath sounds: Normal breath sounds.  Skin:    Findings: No rash.  Neurological:     Mental Status: She is alert and oriented to person, place, and time.  Psychiatric:        Mood and Affect: Mood normal.        Behavior: Behavior normal.    Slight erythema to left ear canal, Patient removed cotton ball prior to exam   BP 126/81   Pulse 92   Temp 97.8 F (36.6 C)   Ht $R'5\' 4"'KX$  (1.626 m)   Wt 163 lb (73.9 kg)   LMP 05/07/2020   SpO2 96%   BMI 27.98 kg/m  Wt Readings from Last 3 Encounters:  05/10/20 163 lb (73.9 kg)  04/26/20 165 lb (74.8 kg)  04/21/20 164 lb 6.4 oz (74.6 kg)    Health Maintenance Due  Topic Date Due  . COVID-19 Vaccine (2 - Pfizer 2-dose series) 05/14/2020    There are no preventive care reminders to display for this patient.   Lab Results  Component Value Date   TSH 1.900 03/05/2020   Lab Results  Component Value Date   WBC 6.6 01/25/2020   WBC 6.6 01/25/2020   HGB 14.1 01/25/2020   HGB 14.1 01/25/2020   HCT 43.0 01/25/2020   HCT 43.0 01/25/2020   MCV 93.1 01/25/2020   MCV 93.1 01/25/2020   PLT 328 01/25/2020   PLT 328 01/25/2020   Lab Results  Component Value Date   NA 141 03/05/2020   K 4.0 03/05/2020   CO2 23 03/05/2020   GLUCOSE 91 03/05/2020   BUN 6 03/05/2020   CREATININE 0.94 03/05/2020   BILITOT 0.2 03/05/2020   ALKPHOS 80 03/05/2020   AST 23 03/05/2020   ALT 23 03/05/2020   PROT 6.6 03/05/2020   ALBUMIN 4.3 03/05/2020   CALCIUM 9.6 03/05/2020   ANIONGAP 11 01/25/2020   Lab Results  Component Value Date   CHOL 124  08/07/2019   Lab Results  Component Value Date   HDL 35 (L) 08/07/2019   Lab Results  Component Value Date   LDLCALC 72 08/07/2019   Lab Results  Component Value Date   TRIG 86 08/07/2019   Lab Results  Component Value Date   CHOLHDL 3.5 08/07/2019    Lab Results  Component Value Date   HGBA1C 8.0 (A) 04/21/2020       Assessment & Plan:   Problem List Items Addressed This Visit      Endocrine   Type 2 diabetes mellitus without complication, without long-term current use of insulin (HCC) - Primary   Relevant Medications   lidocaine (XYLOCAINE) 5 % ointment: Use as needed prior to injection for site pain    Other Visit Diagnoses    Infective otitis externa of left ear: Continue to take drops a prescribed, ear is looking improved   Hives: Resolved at this appointment, monitor as able to figure out the cause.      Return in about 2 months (around 07/10/2020) for Medication follow-up, . Lidocaine ointment ordered for injection site pain Follow up if you need any medication refills Will recheck labs at next appointment. Continue taking Ear drops as ordered  Meds ordered this encounter  Medications  . lidocaine (XYLOCAINE) 5 % ointment    Sig: Apply 1 application topically as needed.    Dispense:  35.44 g    Refill:  0    Order Specific Question:   Supervising Provider    Answer:   Horald Pollen [[id number]]    Thank you for your visit with Primary Care at [facility] today.  Huston Foley Arlethia Basso, FNP-BC [facility] at [place], [place] [address] [phone]

## 2020-05-12 NOTE — ED Provider Notes (Signed)
[facility]    CSN: [id number] Arrival date & time: 05/09/20  1632      History   Chief Complaint Chief Complaint  Patient presents with  . Rash    HPI Sandra Moody is a 49 y.o. female.   Here today for evaluation of an itchy red hives rash that came up 2 days ago on arms legs and back that now seems to be dissipating. Denies pain, fever, new exposures, wheezing, chest tightness, nausea, throat itching/swelling. Has not been taking anything for this and notes it's never happened to her before.        Past Medical History:  Diagnosis Date  . Acute renal disease   . Asthma   . Bilateral carpal tunnel syndrome 03/11/2019  . Common migraine with intractable migraine 08/29/2018  . Daily headache    (03/08/2017)  . Essential hypertension 02/20/2019  . GERD (gastroesophageal reflux disease)   . H pylori ulcer ~ 08/2016  . History of hiatal hernia   . Leaky heart valve ~ 09/2016  . Migraine    "maybe weekly" (03/08/2017)  . PONV (postoperative nausea and vomiting)   . Type 2 diabetes mellitus with hyperglycemia, without long-term current use of insulin ([place]) 02/20/2019    Patient Active Problem List   Diagnosis Date Noted  . Entrapment of left ulnar nerve 04/28/2020  . Diabetic polyneuropathy associated with type 2 diabetes mellitus ([place]) 03/05/2020  . Type 2 diabetes mellitus without complication, without long-term current use of insulin ([place]) 09/05/2019  . Trigger finger of left thumb 04/30/2019  . Infection of flexor tendon sheath 04/15/2019  . Carpal tunnel syndrome, bilateral 03/11/2019  . Essential hypertension 02/20/2019  . Type 2 diabetes mellitus with hyperglycemia, without long-term current use of insulin ([place]) 02/20/2019  . Other acute sinusitis 12/25/2018  . Irritable bowel syndrome 12/04/2018  . Migraine with aura 12/04/2018  . Common migraine with intractable migraine 08/29/2018  . Moderate persistent asthma without complication 32/95/1884  .  Seasonal and perennial allergic rhinitis 06/28/2017  . Mild persistent asthma with acute exacerbation 06/28/2017  . Gastroesophageal reflux disease 06/28/2017  . Biliary dyskinesia 03/08/2017  . Asthma 03/02/2016  . Asthma with acute exacerbation 10/04/2015  . Other and unspecified ovarian cyst 06/17/2012    Past Surgical History:  Procedure Laterality Date  . CARPAL TUNNEL RELEASE Left 12/18/2019   Procedure: CARPAL TUNNEL RELEASE;  Surgeon: [name], MD;  Location: [facility];  Service: Orthopedics;  Laterality: Left;  . CHOLECYSTECTOMY N/A 03/08/2017   Procedure: LAPAROSCOPIC CHOLECYSTECTOMY;  Surgeon: Coralie Keens, MD;  Location: [facility];  Service: General;  Laterality: N/A;  . DILATION AND CURETTAGE OF UTERUS    . LAPAROSCOPIC CHOLECYSTECTOMY  03/08/2017  . TRIGGER FINGER RELEASE Left 12/18/2019   Procedure: RELEASE TRIGGER FINGER/A-1 PULLEY;  Surgeon: [name], MD;  Location: [facility];  Service: Orthopedics;  Laterality: Left;  . TUBAL LIGATION      OB History    Gravida  5   Para  3   Term  3   Preterm  0   AB  2   Living        SAB  1   TAB  1   Ectopic  0   Multiple      Live Births               Home Medications    Prior to Admission medications   Medication Sig Start Date End Date Taking?  Authorizing Provider  ACCU-CHEK GUIDE test strip USE TO CHECK GLUCOSE TWICE DAILY,FASTING AND 2 HOURS AFTER DINNER 05/09/20   Rutherford Guys, MD  albuterol (PROVENTIL) (2.5 MG/3ML) 0.083% nebulizer solution Take 3 mLs (2.5 mg total) by nebulization every 6 (six) hours as needed for wheezing or shortness of breath. 11/13/19   Wendie Agreste, MD  ALBUTEROL SULFATE PO Take by mouth.    [provider]  amLODipine (NORVASC) 2.5 MG tablet TAKE 1 TABLET BY MOUTH EVERY DAY 04/27/20   Rutherford Guys, MD  BIOTIN PO Take by mouth.    [provider]  blood glucose meter kit and supplies Per insurance  preference. Use to check glucose twice a day (fasting and 2 hours after dinner)  Dx E11.65 10/30/18   Rutherford Guys, MD  Cholecalciferol (VITAMIN D3 PO) Take by mouth.    [provider]  ciprofloxacin-dexamethasone (CIPRODEX) OTIC suspension Place 4 drops into the left ear 2 (two) times daily. 05/06/20   Raylene Everts, MD  clobetasol ointment (TEMOVATE) 1.61 % Apply 1 application topically 2 (two) times daily as needed. For itchy rash as needed 05/09/20   Volney American, PA-C  Cyanocobalamin (VITAMIN B-12 PO) Take by mouth.    [provider]  EPIPEN 2-PAK 0.3 MG/0.3ML SOAJ injection Inject 0.3 mg into the muscle as needed (for allergic reaction).  05/18/16   [provider]  fluticasone (FLONASE) 50 MCG/ACT nasal spray USE 1 2 SPRAYS IN EACH NOSTRIL ONCE A DAY 10/25/18   [provider]  Galcanezumab-gnlm (EMGALITY) 120 MG/ML SOAJ Inject 240 mg into the skin every 30 (thirty) days. 10/21/19   Suzzanne Cloud, NP  Galcanezumab-gnlm (EMGALITY) 120 MG/ML SOAJ Inject 120 mg into the skin every 30 (thirty) days. 10/21/19   Suzzanne Cloud, NP  insulin glargine (LANTUS SOLOSTAR) 100 UNIT/ML Solostar Pen Inject 40 Units into the skin daily. 04/21/20   Shamleffer, Melanie Crazier, MD  Insulin Pen Needle (PEN NEEDLES) 32G X 6 MM MISC Use once a day with lantus injection 02/13/20   Rutherford Guys, MD  ketoconazole (NIZORAL) 2 % shampoo Apply 1 application topically 2 (two) times a week. Apply to affected area lather, leave in place for 5 mins and then rinse off with water 01/15/20   Rutherford Guys, MD  levocetirizine (XYZAL) 5 MG tablet 1 2 TABLET IN THE EVENING ONCE A DAY ORALLY 15 11/05/18   [provider]  lidocaine (XYLOCAINE) 5 % ointment Apply 1 application topically as needed. 05/10/20   Just, Laurita Quint, FNP  medroxyPROGESTERone (DEPO-PROVERA) 150 MG/ML injection  08/15/18   [provider]  meloxicam (MOBIC) 15 MG tablet Take 1 tablet (15 mg  total) by mouth daily. 04/26/20   Rutherford Guys, MD  methocarbamol (ROBAXIN) 500 MG tablet Take 1 tablet (500 mg total) by mouth every 8 (eight) hours as needed for muscle spasms. 04/06/20   Maximiano Coss, NP  metroNIDAZOLE (METROGEL) 0.75 % vaginal gel Place 1 Applicatorful vaginally at bedtime. Patient not taking: Reported on 05/10/2020 03/05/20   Rutherford Guys, MD  montelukast (SINGULAIR) 10 MG tablet Take 10 mg by mouth at bedtime.    [provider]  olopatadine (PATANOL) 0.1 % ophthalmic solution Place 1 drop into both eyes 2 (two) times daily. 12/25/18   Maryruth Hancock, MD  omeprazole (PRILOSEC) 20 MG capsule Take 1 capsule (20 mg total) by mouth 2 (two) times daily before a meal. 03/05/20   Pamella Pert,  Lilia Argue, MD  pregabalin (LYRICA) 100 MG capsule Take 1 capsule (100 mg total) by mouth 3 (three) times daily. 03/05/20   Rutherford Guys, MD  rizatriptan (MAXALT) 10 MG tablet TAKE 1 TABLET (10 MG TOTAL) BY MOUTH 3 (THREE) TIMES DAILY AS NEEDED FOR MIGRAINE. 06/02/19   Kathrynn Ducking, MD  sitaGLIPtin (JANUVIA) 100 MG tablet Take 1 tablet (100 mg total) by mouth daily. 04/21/20   Shamleffer, Melanie Crazier, MD  SYMBICORT 160-4.5 MCG/ACT inhaler INHALE 2 PUFFS INTO THE LUNGS DAILY. 05/09/18   Valentina Shaggy, MD  VITAMIN E PO Take by mouth.    [provider]    Family History Family History  Problem Relation Age of Onset  . Thyroid disease Mother   . Diabetes Maternal Grandmother   . Heart disease Maternal Grandfather     Social History Social History   Tobacco Use  . Smoking status: Never Smoker  . Smokeless tobacco: Never Used  Vaping Use  . Vaping Use: Never used  Substance Use Topics  . Alcohol use: Not Currently    Comment: 03/08/2017 "might have a drink on a holiday"  . Drug use: No     Allergies   Ginger, Passion fruit flavor [flavoring agent], Shellfish allergy, Other, and Tamiflu [oseltamivir phosphate]   Review of Systems Review of  Systems PER HPI    Physical Exam Triage Vital Signs ED Triage Vitals [05/09/20 1712]  Enc Vitals Group     BP (!) 146/85     Pulse Rate 74     Resp 16     Temp 98.4 F (36.9 C)     Temp Source Oral     SpO2 100 %     Weight      Height      Head Circumference      Peak Flow      Pain Score 0     Pain Loc      Pain Edu?      Excl. in [place]?    No data found.  Updated Vital Signs BP (!) 146/85 (BP Location: Right Arm)   Pulse 74   Temp 98.4 F (36.9 C) (Oral)   Resp 16   LMP 05/07/2020   SpO2 100%   Visual Acuity Right Eye Distance:   Left Eye Distance:   Bilateral Distance:    Right Eye Near:   Left Eye Near:    Bilateral Near:     Physical Exam Vitals and nursing note reviewed.  Constitutional:      Appearance: Normal appearance. She is not ill-appearing.  HENT:     Head: Atraumatic.     Nose: Nose normal.     Mouth/Throat:     Mouth: Mucous membranes are moist.     Pharynx: Oropharynx is clear.  Eyes:     Extraocular Movements: Extraocular movements intact.     Conjunctiva/sclera: Conjunctivae normal.  Cardiovascular:     Rate and Rhythm: Normal rate and regular rhythm.     Heart sounds: Normal heart sounds.  Pulmonary:     Effort: Pulmonary effort is normal.     Breath sounds: Normal breath sounds.  Musculoskeletal:        General: Normal range of motion.     Cervical back: Normal range of motion and neck supple.  Skin:    General: Skin is warm and dry.     Findings: Rash (minimally evident areas that appear post inflammatory on backs of arms and upper  back, no obvious urticarial rash on body at this time) present.  Neurological:     Mental Status: She is alert and oriented to person, place, and time.  Psychiatric:        Mood and Affect: Mood normal.        Thought Content: Thought content normal.        Judgment: Judgment normal.      UC Treatments / Results  Labs (all labs ordered are listed, but only abnormal results are  displayed) Labs Reviewed - No data to display  EKG   Radiology No results found.  Procedures Procedures (including critical care time)  Medications Ordered in UC Medications - No data to display  Initial Impression / Assessment and Plan / UC Course  I have reviewed the triage vital signs and the nursing notes.  Pertinent labs & imaging results that were available during my care of the patient were reviewed by me and considered in my medical decision making (see chart for details).     Suspect hives rash, mostly dissipated at this time and unclear trigger. Clobetasol sent for prn use in case areas return, in meantime start taking antihistamines daily and keep a log of exposures including hygiene products and diet to isolate what she may be reacting to.   Final Clinical Impressions(s) / UC Diagnoses   Final diagnoses:  Hives   Discharge Instructions   None    ED Prescriptions    Medication Sig Dispense Auth. Provider   clobetasol ointment (TEMOVATE) 8.83 % Apply 1 application topically 2 (two) times daily as needed. For itchy rash as needed 30 g Volney American, [place]     PDMP not reviewed this encounter.   Volney American, [place] 05/12/20 2051

## 2020-05-12 NOTE — Telephone Encounter (Signed)
Approved on October 12th Effective from 05/07/2020 through 05/07/2021.  Faxed to pharmacy

## 2020-05-14 ENCOUNTER — Encounter: Payer: Self-pay | Admitting: Family Medicine

## 2020-05-19 ENCOUNTER — Other Ambulatory Visit: Payer: Self-pay | Admitting: Orthopedic Surgery

## 2020-05-20 ENCOUNTER — Encounter: Payer: Self-pay | Admitting: Neurology

## 2020-05-20 ENCOUNTER — Ambulatory Visit (INDEPENDENT_AMBULATORY_CARE_PROVIDER_SITE_OTHER): Payer: Managed Care, Other (non HMO) | Admitting: Neurology

## 2020-05-20 VITALS — BP 119/78 | HR 93 | Ht 64.0 in | Wt 164.0 lb

## 2020-05-20 DIAGNOSIS — G43019 Migraine without aura, intractable, without status migrainosus: Secondary | ICD-10-CM

## 2020-05-20 MED ORDER — RIZATRIPTAN BENZOATE 10 MG PO TABS: 10.0000 mg | ORAL_TABLET | Freq: Three times a day (TID) | ORAL | 6 refills | Status: DC | PRN

## 2020-05-20 NOTE — Progress Notes (Signed)
PATIENT: Sandra Moody DOB: [birth year]  REASON FOR VISIT: follow up HISTORY FROM: patient  HISTORY OF PRESENT ILLNESS: Today 05/20/20 Sandra Moody is a 49 year old female history of migraine headache.  Has previously been on Tofranil, but stopped after she felt it was interfering with her diabetes.  Previously tried Topamax.  In March was placed on Emgality.  Has been on now for about 3 to 4 months, has really helped her headaches, more than 80%.  In the last several months, has not had any severe migraine.  Has had a few mild headaches, has taken Maxalt twice.  She has been dealing with some orthopedic issues, had sciatica, frozen shoulder on the right, ear infection, is also on insulin for diabetes.  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  Seems to be tolerating the Emgality injections well, does have some burning initially.  Is on Lyrica for foot pain.  Overall, headaches are much improved.  Presents today for evaluation unaccompanied.  Planning to have right carpal tunnel surgery with Dr. Fredna Dow.  HISTORY 10/21/2019 SS: Sandra Moody is a 49 year old female with history of migraine headache.  Since last seen, she stopped taking Tofranil, was afraid it could be affecting her diabetes, was tired of taking multiple medications.  Since off the medicine, has had at least 2 migraines a week, with nausea.  Will take Maxalt with good benefit.  Her diabetes is now under control.  She is seeing a foot doctor, is prescribed Lyrica.  She says her headaches worsen during allergy season.  She has asthma.  She says she has previously been on Topamax.  She has not missing work, but says she has to go to work, she works as a Advertising account executive.  She has seen Dr. Fredna Dow, for thumb issues, says is doing well.  She presents today for evaluation unaccompanied.    REVIEW OF SYSTEMS: Out of a complete 14 system review of symptoms, the patient complains only of the following symptoms, and all other  reviewed systems are negative.  Headache  ALLERGIES: Allergies  Allergen Reactions  . Ginger Itching and Swelling  . Passion Fruit Flavor [Flavoring Agent] Itching and Swelling  . Shellfish Allergy Anaphylaxis  . Other Other (See Comments)    ALLERGIC TO ALL TREE NUTS ALLERGIC TO ALL TREE NUTS  . Tamiflu [Oseltamivir Phosphate]     HOME MEDICATIONS: Outpatient Medications Prior to Visit  Medication Sig Dispense Refill  . ACCU-CHEK GUIDE test strip USE TO CHECK GLUCOSE TWICE DAILY,FASTING AND 2 HOURS AFTER DINNER 100 strip 5  . albuterol (PROVENTIL) (2.5 MG/3ML) 0.083% nebulizer solution Take 3 mLs (2.5 mg total) by nebulization every 6 (six) hours as needed for wheezing or shortness of breath. 75 mL 1  . ALBUTEROL SULFATE PO Take by mouth.    Marland Kitchen amLODipine (NORVASC) 2.5 MG tablet TAKE 1 TABLET BY MOUTH EVERY DAY 90 tablet 1  . BIOTIN PO Take by mouth.    . blood glucose meter kit and supplies Per insurance preference. Use to check glucose twice a day (fasting and 2 hours after dinner)  Dx E11.65 1 each 5  . Cholecalciferol (VITAMIN D3 PO) Take by mouth.    . Cyanocobalamin (VITAMIN B-12 PO) Take by mouth.    . EPIPEN 2-PAK 0.3 MG/0.3ML SOAJ injection Inject 0.3 mg into the muscle as needed (for allergic reaction).   2  . fluticasone (FLONASE) 50 MCG/ACT nasal spray USE 1 2 SPRAYS IN EACH NOSTRIL ONCE A  DAY    . Galcanezumab-gnlm (EMGALITY) 120 MG/ML SOAJ Inject 120 mg into the skin every 30 (thirty) days. 1 pen 11  . insulin glargine (LANTUS SOLOSTAR) 100 UNIT/ML Solostar Pen Inject 40 Units into the skin daily. 15 mL 6  . Insulin Pen Needle (PEN NEEDLES) 32G X 6 MM MISC Use once a day with lantus injection 100 each 2  . ketoconazole (NIZORAL) 2 % shampoo Apply 1 application topically 2 (two) times a week. Apply to affected area lather, leave in place for 5 mins and then rinse off with water 120 mL 5  . levocetirizine (XYZAL) 5 MG tablet 1 2 TABLET IN THE EVENING ONCE A DAY ORALLY 15     . lidocaine (XYLOCAINE) 5 % ointment Apply 1 application topically as needed. 35.44 g 0  . medroxyPROGESTERone (DEPO-PROVERA) 150 MG/ML injection     . meloxicam (MOBIC) 15 MG tablet Take 1 tablet (15 mg total) by mouth daily. 30 tablet 0  . methocarbamol (ROBAXIN) 500 MG tablet Take 1 tablet (500 mg total) by mouth every 8 (eight) hours as needed for muscle spasms. 60 tablet 0  . montelukast (SINGULAIR) 10 MG tablet Take 10 mg by mouth at bedtime.    Marland Kitchen olopatadine (PATANOL) 0.1 % ophthalmic solution Place 1 drop into both eyes 2 (two) times daily. 5 mL 12  . pregabalin (LYRICA) 100 MG capsule Take 1 capsule (100 mg total) by mouth 3 (three) times daily. 90 capsule 3  . sitaGLIPtin (JANUVIA) 100 MG tablet Take 1 tablet (100 mg total) by mouth daily. 90 tablet 3  . SYMBICORT 160-4.5 MCG/ACT inhaler INHALE 2 PUFFS INTO THE LUNGS DAILY. 10.2 Inhaler 0  . VITAMIN E PO Take by mouth.    . Galcanezumab-gnlm (EMGALITY) 120 MG/ML SOAJ Inject 240 mg into the skin every 30 (thirty) days. 2 pen 0  . rizatriptan (MAXALT) 10 MG tablet TAKE 1 TABLET (10 MG TOTAL) BY MOUTH 3 (THREE) TIMES DAILY AS NEEDED FOR MIGRAINE. 6 tablet 6  . ciprofloxacin-dexamethasone (CIPRODEX) OTIC suspension Place 4 drops into the left ear 2 (two) times daily. 7.5 mL 0  . clobetasol ointment (TEMOVATE) 9.62 % Apply 1 application topically 2 (two) times daily as needed. For itchy rash as needed 30 g 0  . metroNIDAZOLE (METROGEL) 0.75 % vaginal gel Place 1 Applicatorful vaginally at bedtime. (Patient not taking: Reported on 05/10/2020) 70 g 0  . omeprazole (PRILOSEC) 20 MG capsule Take 1 capsule (20 mg total) by mouth 2 (two) times daily before a meal. 60 capsule 3   No facility-administered medications prior to visit.    PAST MEDICAL HISTORY: Past Medical History:  Diagnosis Date  . Acute renal disease   . Asthma   . Bilateral carpal tunnel syndrome 03/11/2019  . Common migraine with intractable migraine 08/29/2018  . Daily  headache    (03/08/2017)  . Essential hypertension 02/20/2019  . GERD (gastroesophageal reflux disease)   . H pylori ulcer ~ 08/2016  . History of hiatal hernia   . Leaky heart valve ~ 09/2016  . Migraine    "maybe weekly" (03/08/2017)  . PONV (postoperative nausea and vomiting)   . Type 2 diabetes mellitus with hyperglycemia, without long-term current use of insulin ([facility]) 02/20/2019    PAST SURGICAL HISTORY: Past Surgical History:  Procedure Laterality Date  . CARPAL TUNNEL RELEASE Left 12/18/2019   Procedure: CARPAL TUNNEL RELEASE;  Surgeon: [name], MD;  Location: [facility];  Service: Orthopedics;  Laterality: Left;  .  CHOLECYSTECTOMY N/A 03/08/2017   Procedure: LAPAROSCOPIC CHOLECYSTECTOMY;  Surgeon: Coralie Keens, MD;  Location: [facility];  Service: General;  Laterality: N/A;  . DILATION AND CURETTAGE OF UTERUS    . LAPAROSCOPIC CHOLECYSTECTOMY  03/08/2017  . TRIGGER FINGER RELEASE Left 12/18/2019   Procedure: RELEASE TRIGGER FINGER/A-1 PULLEY;  Surgeon: [name], MD;  Location: [facility];  Service: Orthopedics;  Laterality: Left;  . TUBAL LIGATION      FAMILY HISTORY: Family History  Problem Relation Age of Onset  . Thyroid disease Mother   . Diabetes Maternal Grandmother   . Heart disease Maternal Grandfather     SOCIAL HISTORY: Social History   Socioeconomic History  . Marital status: Significant Other    Spouse name: Not on file  . Number of children: 3  . Years of education: Not on file  . Highest education level: 12th grade  Occupational History    Comment: [employer]   Tobacco Use  . Smoking status: Never Smoker  . Smokeless tobacco: Never Used  Vaping Use  . Vaping Use: Never used  Substance and Sexual Activity  . Alcohol use: Not Currently    Comment: 03/08/2017 "might have a drink on a holiday"  . Drug use: No  . Sexual activity: Yes    Partners: Male    Birth control/protection: Injection  Other Topics Concern   . Not on file  Social History Narrative   ** Merged History Encounter **   Right handed    Lives at home with significant other 2 cups daily of caffeine    Social Determinants of Health   Financial Resource Strain:   . Difficulty of Paying Living Expenses: Not on file  Food Insecurity:   . Worried About Charity fundraiser in the Last Year: Not on file  . Ran Out of Food in the Last Year: Not on file  Transportation Needs:   . Lack of Transportation (Medical): Not on file  . Lack of Transportation (Non-Medical): Not on file  Physical Activity:   . Days of Exercise per Week: Not on file  . Minutes of Exercise per Session: Not on file  Stress:   . Feeling of Stress : Not on file  Social Connections:   . Frequency of Communication with Friends and Family: Not on file  . Frequency of Social Gatherings with Friends and Family: Not on file  . Attends Religious Services: Not on file  . Active Member of Clubs or Organizations: Not on file  . Attends Archivist Meetings: Not on file  . Marital Status: Not on file  Intimate Partner Violence:   . Fear of Current or Ex-Partner: Not on file  . Emotionally Abused: Not on file  . Physically Abused: Not on file  . Sexually Abused: Not on file   PHYSICAL EXAM  Vitals:   05/20/20 0727  BP: 119/78  Pulse: 93  Weight: 164 lb (74.4 kg)  Height: _0  (1.626 m)   Body mass index is 28.15 kg/m.  Generalized: Well developed, in no acute distress   Neurological examination  Mentation: Alert oriented to time, place, history taking. Follows all commands speech and language fluent Cranial nerve II-XII: Pupils were equal round reactive to light. Extraocular movements were full, visual field were full on confrontational test. Facial sensation and strength were normal.  Head turning and shoulder shrug  were normal and symmetric. Motor: The motor testing reveals 5 over 5 strength of all 4 extremities. Good  symmetric motor tone is noted  throughout.  Sensory: Sensory testing is intact to soft touch on all 4 extremities. No evidence of extinction is noted.  Coordination: Cerebellar testing reveals good finger-nose-finger and heel-to-shin bilaterally.  Gait and station: Gait is normal.  Reflexes: Deep tendon reflexes are symmetric and normal bilaterally.   DIAGNOSTIC DATA (LABS, IMAGING, TESTING) - I reviewed patient records, labs, notes, testing and imaging myself where available.  Lab Results  Component Value Date   WBC 6.6 01/25/2020   WBC 6.6 01/25/2020   HGB 14.1 01/25/2020   HGB 14.1 01/25/2020   HCT 43.0 01/25/2020   HCT 43.0 01/25/2020   MCV 93.1 01/25/2020   MCV 93.1 01/25/2020   PLT 328 01/25/2020   PLT 328 01/25/2020      Component Value Date/Time   NA 141 03/05/2020 1108   K 4.0 03/05/2020 1108   CL 106 03/05/2020 1108   CO2 23 03/05/2020 1108   GLUCOSE 91 03/05/2020 1108   GLUCOSE 248 (H) 01/25/2020 0951   BUN 6 03/05/2020 1108   CREATININE 0.94 03/05/2020 1108   CALCIUM 9.6 03/05/2020 1108   PROT 6.6 03/05/2020 1108   ALBUMIN 4.3 03/05/2020 1108   AST 23 03/05/2020 1108   ALT 23 03/05/2020 1108   ALKPHOS 80 03/05/2020 1108   BILITOT 0.2 03/05/2020 1108   GFRNONAA 72 03/05/2020 1108   GFRAA 84 03/05/2020 1108   Lab Results  Component Value Date   CHOL 124 08/07/2019   HDL 35 (L) 08/07/2019   LDLCALC 72 08/07/2019   TRIG 86 08/07/2019   CHOLHDL 3.5 08/07/2019   Lab Results  Component Value Date   HGBA1C 8.0 (A) 04/21/2020   Lab Results  Component Value Date   [id number] 741 05/23/2019   Lab Results  Component Value Date   TSH 1.900 03/05/2020   ASSESSMENT AND PLAN 49 y.o. year old female  has a past medical history of Acute renal disease, Asthma, Bilateral carpal tunnel syndrome (03/11/2019), Common migraine with intractable migraine (08/29/2018), Daily headache, Essential hypertension (02/20/2019), GERD (gastroesophageal reflux disease), H pylori ulcer (~ 08/2016), History of  hiatal hernia, Leaky heart valve (~ 09/2016), Migraine, PONV (postoperative nausea and vomiting), and Type 2 diabetes mellitus with hyperglycemia, without long-term current use of insulin ([facility] [facility]) (02/20/2019). here with:  1.  Chronic migraine headache 2.  Diabetes 3.  Bilateral carpal tunnel syndrome  -Headaches currently well controlled, 80% improvement with Emgality -Continue Emgality 120 mg monthly injection for migraine prevention -Continue Maxalt as needed for acute headache -Could not tolerate Tofranil, Topamax -Follow-up in 8 months or sooner if needed  I spent 20 minutes of face-to-face and non-face-to-face time with patient.  This included previsit chart review, lab review, study review, order entry, electronic health record documentation, patient education.  Butler Denmark, AGNP-C, DNP 05/20/2020, 7:51 AM [facility] [address] [place], [place] [address] [phone]

## 2020-05-20 NOTE — Patient Instructions (Addendum)
Continue Emgality for migraine prevention  Continue Maxalt as needed for acute headache See you back in 8 months

## 2020-05-20 NOTE — Progress Notes (Signed)
I have read the note, and I agree with the clinical assessment and plan.  Caia Lofaro K Constance Hackenberg   

## 2020-05-21 ENCOUNTER — Other Ambulatory Visit: Payer: Self-pay | Admitting: Orthopedic Surgery

## 2020-06-01 ENCOUNTER — Encounter: Payer: Self-pay | Admitting: Registered Nurse

## 2020-06-01 ENCOUNTER — Ambulatory Visit (INDEPENDENT_AMBULATORY_CARE_PROVIDER_SITE_OTHER): Payer: Managed Care, Other (non HMO) | Admitting: Registered Nurse

## 2020-06-01 ENCOUNTER — Other Ambulatory Visit: Payer: Self-pay

## 2020-06-01 VITALS — BP 134/85 | HR 88 | Temp 98.0°F | Resp 18 | Ht 64.0 in | Wt 164.4 lb

## 2020-06-01 DIAGNOSIS — H60392 Other infective otitis externa, left ear: Secondary | ICD-10-CM

## 2020-06-01 DIAGNOSIS — E119 Type 2 diabetes mellitus without complications: Secondary | ICD-10-CM

## 2020-06-01 DIAGNOSIS — H8112 Benign paroxysmal vertigo, left ear: Secondary | ICD-10-CM

## 2020-06-01 DIAGNOSIS — R5381 Other malaise: Secondary | ICD-10-CM

## 2020-06-01 DIAGNOSIS — R002 Palpitations: Secondary | ICD-10-CM

## 2020-06-01 LAB — POCT CBC
Granulocyte percent: 57.5 %G (ref 37–80)
HCT, POC: 39.4 % (ref 29–41)
Hemoglobin: 13.3 g/dL (ref 11–14.6)
Lymph, poc: 3.3 (ref 0.6–3.4)
MCH, POC: 31.5 pg — AB (ref 27–31.2)
MCHC: 33.9 g/dL (ref 31.8–35.4)
MCV: 93 fL (ref 76–111)
MID (cbc): 0.6 (ref 0–0.9)
MPV: 8.1 fL (ref 0–99.8)
POC Granulocyte: 5.3 (ref 2–6.9)
POC LYMPH PERCENT: 36 %L (ref 10–50)
POC MID %: 6.5 %M (ref 0–12)
Platelet Count, POC: 356 10*3/uL (ref 142–424)
RBC: 4.23 M/uL (ref 4.04–5.48)
RDW, POC: 13 %
WBC: 9.2 10*3/uL (ref 4.6–10.2)

## 2020-06-01 LAB — POCT GLYCOSYLATED HEMOGLOBIN (HGB A1C): Hemoglobin A1C: 7.5 % — AB (ref 4.0–5.6)

## 2020-06-01 LAB — GLUCOSE, POCT (MANUAL RESULT ENTRY): POC Glucose: 143 mg/dl — AB (ref 70–99)

## 2020-06-01 MED ORDER — MECLIZINE HCL 12.5 MG PO TABS: 12.5000 mg | ORAL_TABLET | Freq: Three times a day (TID) | ORAL | 0 refills | Status: DC | PRN

## 2020-06-01 MED ORDER — HYDROCORTISONE-ACETIC ACID 1-2 % OT SOLN: 3.0000 [drp] | Freq: Two times a day (BID) | OTIC | 0 refills | Status: DC

## 2020-06-01 NOTE — Patient Instructions (Signed)
° ° ° °  If you have lab work done today you will be contacted with your lab results within the next 2 weeks.  If you have not heard from us then please contact us. The fastest way to get your results is to register for My Chart. ° ° °IF you received an x-ray today, you will receive an invoice from [facility]. Please contact [facility] at [phone] with questions or concerns regarding your invoice.  ° °IF you received labwork today, you will receive an invoice from [facility]. Please contact [facility] at [phone] with questions or concerns regarding your invoice.  ° °Our billing staff will not be able to assist you with questions regarding bills from these companies. ° °You will be contacted with the lab results as soon as they are available. The fastest way to get your results is to activate your My Chart account. Instructions are located on the last page of this paperwork. If you have not heard from us regarding the results in 2 weeks, please contact this office. °  ° ° ° °

## 2020-06-02 ENCOUNTER — Other Ambulatory Visit: Payer: Self-pay | Admitting: Registered Nurse

## 2020-06-02 DIAGNOSIS — E876 Hypokalemia: Secondary | ICD-10-CM

## 2020-06-02 LAB — BASIC METABOLIC PANEL
BUN/Creatinine Ratio: 7 — ABNORMAL LOW (ref 9–23)
BUN: 7 mg/dL (ref 6–24)
CO2: 22 mmol/L (ref 20–29)
Calcium: 9.2 mg/dL (ref 8.7–10.2)
Chloride: 104 mmol/L (ref 96–106)
Creatinine, Ser: 0.97 mg/dL (ref 0.57–1.00)
GFR calc Af Amer: 80 mL/min/{1.73_m2} (ref >59)
GFR calc non Af Amer: 70 mL/min/{1.73_m2} (ref >59)
Glucose: 142 mg/dL — ABNORMAL HIGH (ref 65–99)
Potassium: 3.3 mmol/L — ABNORMAL LOW (ref 3.5–5.2)
Sodium: 142 mmol/L (ref 134–144)

## 2020-06-02 LAB — TSH: TSH: 1.59 u[IU]/mL (ref 0.450–4.500)

## 2020-06-02 MED ORDER — POTASSIUM CHLORIDE CRYS ER 20 MEQ PO TBCR: 20.0000 meq | EXTENDED_RELEASE_TABLET | Freq: Every day | ORAL | 3 refills | Status: DC

## 2020-06-07 ENCOUNTER — Encounter: Payer: Self-pay | Admitting: Registered Nurse

## 2020-06-07 NOTE — Progress Notes (Signed)
Acute Office Visit  Subjective:    Patient ID: Sandra Moody, female    DOB: 04/08/1971, 49 y.o.   MRN: [id number]  Chief Complaint  Patient presents with  . Palpitations    PAtient states she has been having heart Palpitations since yesterday. Per patient she has also had a migraine at 2 am and felt dizzy.    HPI Patient is in today for palpitations Feeling on and off since yesterday No irregular beats or skipped beats, just occasionally gets faster  Happens when she gets dizzy - feels that dizziness may be coming first  Did have a recent migraine that caused some dizziness  Denies chest pain, shob, doe, visual changes, dependent edema.  Has not happened before - some concern for vertigo as she states.   Past Medical History:  Diagnosis Date  . Acute renal disease   . Asthma   . Bilateral carpal tunnel syndrome 03/11/2019  . Common migraine with intractable migraine 08/29/2018  . Daily headache    (03/08/2017)  . Essential hypertension 02/20/2019  . GERD (gastroesophageal reflux disease)   . H pylori ulcer ~ 08/2016  . History of hiatal hernia   . Leaky heart valve ~ 09/2016  . Migraine    "maybe weekly" (03/08/2017)  . PONV (postoperative nausea and vomiting)   . Type 2 diabetes mellitus with hyperglycemia, without long-term current use of insulin ([facility]) 02/20/2019    Past Surgical History:  Procedure Laterality Date  . CARPAL TUNNEL RELEASE Left 12/18/2019   Procedure: CARPAL TUNNEL RELEASE;  Surgeon: [name], MD;  Location: [place];  Service: Orthopedics;  Laterality: Left;  . CHOLECYSTECTOMY N/A 03/08/2017   Procedure: LAPAROSCOPIC CHOLECYSTECTOMY;  Surgeon: Coralie Keens, MD;  Location: [place];  Service: General;  Laterality: N/A;  . DILATION AND CURETTAGE OF UTERUS    . LAPAROSCOPIC CHOLECYSTECTOMY  03/08/2017  . TRIGGER FINGER RELEASE Left 12/18/2019   Procedure: RELEASE TRIGGER FINGER/A-1 PULLEY;  Surgeon: [name], MD;  Location: [facility];  Service: Orthopedics;  Laterality: Left;  . TUBAL LIGATION      Family History  Problem Relation Age of Onset  . Thyroid disease Mother   . Diabetes Maternal Grandmother   . Heart disease Maternal Grandfather     Social History   Socioeconomic History  . Marital status: Significant Other    Spouse name: Not on file  . Number of children: 3  . Years of education: Not on file  . Highest education level: 12th grade  Occupational History    Comment: [employer]   Tobacco Use  . Smoking status: Never Smoker  . Smokeless tobacco: Never Used  Vaping Use  . Vaping Use: Never used  Substance and Sexual Activity  . Alcohol use: Not Currently    Comment: 03/08/2017 "might have a drink on a holiday"  . Drug use: No  . Sexual activity: Yes    Partners: Male    Birth control/protection: Injection  Other Topics Concern  . Not on file  Social History Narrative   ** Merged History Encounter **   Right handed    Lives at home with significant other 2 cups daily of caffeine    Social Determinants of Health   Financial Resource Strain:   . Difficulty of Paying Living Expenses: Not on file  Food Insecurity:   . Worried About Charity fundraiser in the Last Year: Not on file  . Ran Out of Food in  the Last Year: Not on file  Transportation Needs:   . Lack of Transportation (Medical): Not on file  . Lack of Transportation (Non-Medical): Not on file  Physical Activity:   . Days of Exercise per Week: Not on file  . Minutes of Exercise per Session: Not on file  Stress:   . Feeling of Stress : Not on file  Social Connections:   . Frequency of Communication with Friends and Family: Not on file  . Frequency of Social Gatherings with Friends and Family: Not on file  . Attends Religious Services: Not on file  . Active Member of Clubs or Organizations: Not on file  . Attends Archivist Meetings: Not on file  . Marital Status: Not on file  Intimate  Partner Violence:   . Fear of Current or Ex-Partner: Not on file  . Emotionally Abused: Not on file  . Physically Abused: Not on file  . Sexually Abused: Not on file    Outpatient Medications Prior to Visit  Medication Sig Dispense Refill  . ACCU-CHEK GUIDE test strip USE TO CHECK GLUCOSE TWICE DAILY,FASTING AND 2 HOURS AFTER DINNER 100 strip 5  . albuterol (PROVENTIL) (2.5 MG/3ML) 0.083% nebulizer solution Take 3 mLs (2.5 mg total) by nebulization every 6 (six) hours as needed for wheezing or shortness of breath. 75 mL 1  . ALBUTEROL SULFATE PO Take by mouth.    Marland Kitchen amLODipine (NORVASC) 2.5 MG tablet TAKE 1 TABLET BY MOUTH EVERY DAY 90 tablet 1  . BIOTIN PO Take by mouth.    . blood glucose meter kit and supplies Per insurance preference. Use to check glucose twice a day (fasting and 2 hours after dinner)  Dx E11.65 1 each 5  . Cholecalciferol (VITAMIN D3 PO) Take by mouth.    . Cyanocobalamin (VITAMIN B-12 PO) Take by mouth.    . EPIPEN 2-PAK 0.3 MG/0.3ML SOAJ injection Inject 0.3 mg into the muscle as needed (for allergic reaction).   2  . fluticasone (FLONASE) 50 MCG/ACT nasal spray USE 1 2 SPRAYS IN EACH NOSTRIL ONCE A DAY    . Galcanezumab-gnlm (EMGALITY) 120 MG/ML SOAJ Inject 120 mg into the skin every 30 (thirty) days. 1 pen 11  . insulin glargine (LANTUS SOLOSTAR) 100 UNIT/ML Solostar Pen Inject 40 Units into the skin daily. 15 mL 6  . Insulin Pen Needle (PEN NEEDLES) 32G X 6 MM MISC Use once a day with lantus injection 100 each 2  . ketoconazole (NIZORAL) 2 % shampoo Apply 1 application topically 2 (two) times a week. Apply to affected area lather, leave in place for 5 mins and then rinse off with water 120 mL 5  . levocetirizine (XYZAL) 5 MG tablet 1 2 TABLET IN THE EVENING ONCE A DAY ORALLY 15    . lidocaine (XYLOCAINE) 5 % ointment Apply 1 application topically as needed. 35.44 g 0  . medroxyPROGESTERone (DEPO-PROVERA) 150 MG/ML injection     . meloxicam (MOBIC) 15 MG tablet  Take 1 tablet (15 mg total) by mouth daily. 30 tablet 0  . methocarbamol (ROBAXIN) 500 MG tablet Take 1 tablet (500 mg total) by mouth every 8 (eight) hours as needed for muscle spasms. 60 tablet 0  . montelukast (SINGULAIR) 10 MG tablet Take 10 mg by mouth at bedtime.    Marland Kitchen olopatadine (PATANOL) 0.1 % ophthalmic solution Place 1 drop into both eyes 2 (two) times daily. 5 mL 12  . pregabalin (LYRICA) 100 MG capsule Take 1 capsule (100  mg total) by mouth 3 (three) times daily. 90 capsule 3  . rizatriptan (MAXALT) 10 MG tablet Take 1 tablet (10 mg total) by mouth 3 (three) times daily as needed for migraine. 6 tablet 6  . sitaGLIPtin (JANUVIA) 100 MG tablet Take 1 tablet (100 mg total) by mouth daily. 90 tablet 3  . SYMBICORT 160-4.5 MCG/ACT inhaler INHALE 2 PUFFS INTO THE LUNGS DAILY. 10.2 Inhaler 0  . VITAMIN E PO Take by mouth.     No facility-administered medications prior to visit.    Allergies  Allergen Reactions  . Ginger Itching and Swelling  . Passion Fruit Flavor [Flavoring Agent] Itching and Swelling  . Shellfish Allergy Anaphylaxis  . Other Other (See Comments)    ALLERGIC TO ALL TREE NUTS ALLERGIC TO ALL TREE NUTS  . Tamiflu [Oseltamivir Phosphate]     Review of Systems  Constitutional: Negative.   HENT: Negative.   Eyes: Negative.   Respiratory: Negative.   Cardiovascular: Negative.   Gastrointestinal: Negative.   Genitourinary: Negative.   Musculoskeletal: Negative.   Skin: Negative.   Neurological: Negative.   Psychiatric/Behavioral: Negative.        Objective:    Physical Exam Vitals and nursing note reviewed.  Constitutional:      Appearance: Normal appearance.  Eyes:     Extraocular Movements: Extraocular movements intact.     Pupils: Pupils are equal, round, and reactive to light.  Cardiovascular:     Rate and Rhythm: Normal rate and regular rhythm.     Heart sounds: Normal heart sounds. No murmur heard.  No friction rub. No gallop.   Pulmonary:      Effort: Pulmonary effort is normal. No respiratory distress.     Breath sounds: Normal breath sounds. No stridor. No wheezing, rhonchi or rales.  Chest:     Chest wall: No tenderness.  Skin:    General: Skin is warm and dry.     Capillary Refill: Capillary refill takes less than 2 seconds.     Coloration: Skin is not jaundiced or pale.     Findings: No bruising, erythema, lesion or rash.  Neurological:     General: No focal deficit present.     Mental Status: She is alert and oriented to person, place, and time. Mental status is at baseline.     Cranial Nerves: Cranial nerves are intact.     Motor: Motor function is intact.     Coordination: Coordination is intact.  Psychiatric:        Mood and Affect: Mood normal.        Behavior: Behavior normal.        Thought Content: Thought content normal.        Judgment: Judgment normal.     BP 134/85   Pulse 88   Temp 98 F (36.7 C) (Temporal)   Resp 18   Ht 5' 4"  (1.626 m)   Wt 164 lb 6.4 oz (74.6 kg)   LMP 05/07/2020   SpO2 95%   BMI 28.22 kg/m  Wt Readings from Last 3 Encounters:  06/01/20 164 lb 6.4 oz (74.6 kg)  05/20/20 164 lb (74.4 kg)  05/10/20 163 lb (73.9 kg)    Health Maintenance Due  Topic Date Due  . INFLUENZA VACCINE  02/29/2020  . FOOT EXAM  05/22/2020    There are no preventive care reminders to display for this patient.   Lab Results  Component Value Date   TSH 1.590 06/01/2020   Lab Results  Component Value Date   WBC 9.2 06/01/2020   HGB 13.3 06/01/2020   HCT 39.4 06/01/2020   MCV 93.0 06/01/2020   PLT 328 01/25/2020   PLT 328 01/25/2020   Lab Results  Component Value Date   NA 142 06/01/2020   K 3.3 (L) 06/01/2020   CO2 22 06/01/2020   GLUCOSE 142 (H) 06/01/2020   BUN 7 06/01/2020   CREATININE 0.97 06/01/2020   BILITOT 0.2 03/05/2020   ALKPHOS 80 03/05/2020   AST 23 03/05/2020   ALT 23 03/05/2020   PROT 6.6 03/05/2020   ALBUMIN 4.3 03/05/2020   CALCIUM 9.2 06/01/2020    ANIONGAP 11 01/25/2020   Lab Results  Component Value Date   CHOL 124 08/07/2019   Lab Results  Component Value Date   HDL 35 (L) 08/07/2019   Lab Results  Component Value Date   LDLCALC 72 08/07/2019   Lab Results  Component Value Date   TRIG 86 08/07/2019   Lab Results  Component Value Date   CHOLHDL 3.5 08/07/2019   Lab Results  Component Value Date   HGBA1C 7.5 (A) 06/01/2020       Assessment & Plan:   Problem List Items Addressed This Visit      Endocrine   Type 2 diabetes mellitus without complication, without long-term current use of insulin ([facility]) - Primary   Relevant Orders   POCT glycosylated hemoglobin (Hb A1C) (Completed)   Glucose (CBG) (Completed)    Other Visit Diagnoses    Malaise       Relevant Orders   POCT CBC (Completed)   Palpitations       Relevant Orders   EKG 12-Lead (Completed)   Basic Metabolic Panel (Completed)   TSH (Completed)   Benign paroxysmal positional vertigo of left ear       Relevant Medications   meclizine (ANTIVERT) 12.5 MG tablet   Infective otitis externa of left ear       Relevant Medications   acetic acid-hydrocortisone (VOSOL-HC) OTIC solution       Meds ordered this encounter  Medications  . acetic acid-hydrocortisone (VOSOL-HC) OTIC solution    Sig: Place 3 drops into the left ear 2 (two) times daily.    Dispense:  10 mL    Refill:  0    Order Specific Question:   Supervising Provider    Answer:   Carlota Raspberry, JEFFREY R [[id number]]  . meclizine (ANTIVERT) 12.5 MG tablet    Sig: Take 1 tablet (12.5 mg total) by mouth 3 (three) times daily as needed for dizziness.    Dispense:  30 tablet    Refill:  0    Order Specific Question:   Supervising Provider    Answer:   Carlota Raspberry, JEFFREY R [[id number]]   PLAN  vosol and antivert  Monitor for improvement  EKG wnl. Compared to 08/04/19, normal as well.   Labs collected, will follow up as warranted  Patient encouraged to call clinic with any questions, comments, or  concerns.   Maximiano Coss, NP

## 2020-06-08 ENCOUNTER — Encounter: Payer: Managed Care, Other (non HMO) | Admitting: Neurology

## 2020-06-14 ENCOUNTER — Ambulatory Visit (INDEPENDENT_AMBULATORY_CARE_PROVIDER_SITE_OTHER): Payer: Managed Care, Other (non HMO) | Admitting: Family Medicine

## 2020-06-14 ENCOUNTER — Other Ambulatory Visit: Payer: Self-pay

## 2020-06-14 ENCOUNTER — Encounter: Payer: Self-pay | Admitting: Family Medicine

## 2020-06-14 VITALS — BP 119/80 | HR 93 | Temp 98.5°F | Ht 64.0 in | Wt 167.0 lb

## 2020-06-14 DIAGNOSIS — L299 Pruritus, unspecified: Secondary | ICD-10-CM

## 2020-06-14 DIAGNOSIS — E876 Hypokalemia: Secondary | ICD-10-CM

## 2020-06-14 DIAGNOSIS — H9202 Otalgia, left ear: Secondary | ICD-10-CM

## 2020-06-14 DIAGNOSIS — L219 Seborrheic dermatitis, unspecified: Secondary | ICD-10-CM | POA: Diagnosis not present

## 2020-06-14 MED ORDER — OFLOXACIN 0.3 % OT SOLN: 10.0000 [drp] | Freq: Every day | OTIC | 0 refills | Status: DC

## 2020-06-14 MED ORDER — POTASSIUM CHLORIDE ER 10 MEQ PO TBCR: 10.0000 meq | EXTENDED_RELEASE_TABLET | Freq: Every day | ORAL | 2 refills | Status: DC

## 2020-06-14 MED ORDER — KETOCONAZOLE 2 % EX CREA: 1.0000 "application " | TOPICAL_CREAM | Freq: Every day | CUTANEOUS | 0 refills | Status: AC

## 2020-06-14 MED ORDER — TRIAMCINOLONE ACETONIDE 0.1 % EX CREA: 1.0000 "application " | TOPICAL_CREAM | Freq: Two times a day (BID) | CUTANEOUS | 0 refills | Status: DC

## 2020-06-14 NOTE — Progress Notes (Signed)
Subjective:  Patient ID: Sandra Moody, female    DOB: 11-01-1970  Age: 49 y.o. MRN: [id number]  CC:  Chief Complaint  Patient presents with  . Establish Care    PT reports as far as her general health she feels fine. pt states she does need her ears rechecked for an infection. pt als reports that she gets dry patches on her scalp and it's starting to move to her face.    HPI Sandra Moody presents for  Initially presented to establish care but with concerns as above.  History of diabetes, hypertension, IBS, asthma, migraine, GERD, hiatal hernia, history of H. pylori ulcer.  Otitis externa, left ear, with BPV.: Treated with ciprodex 05/06/20 form Urgent care. - completed course. Pain improved at 11/2 visit.  Evaluated November 2 with Rich.  Started on VoSoL HC, meclizine. Feels about the same. Feels like drops are not getting in. Will lay on side after drops for hours and leaks out when sitting up. Was using drops tid - ran out 2 days ago.  Itchy feeling. No pain.  No fever.  Hearing ok without the drops.  Feels pressure, sometimes popping with yawning. Hurt to pop other day.  No blood drainage.  Denies nasal congestion , sneezing running nose, but has allergies - taking daily flonase (2 spr BID), singulair, xyzal, Allergist: Dr. Donneta Romberg.  ENT: Dr. Lucia Gaskins  Hypokalemia: potassium 3.3 on November 2.  Started on 20 mEq daily. Has taken, but large pills.   Dry patches of scalp/face Dry spots on scalp for years - used ketoconazole shampoo in past. 2 days per week. Doing, then noticed into eye brows and on hairline in front of scalp. No steroid shampoo/cream.  Face wash: dial soap, face cream: Loreal revtialift.     History  Patient Active Problem List   Diagnosis Date Noted  . Entrapment of left ulnar nerve 04/28/2020  . Diabetic polyneuropathy associated with type 2 diabetes mellitus ([place]) 03/05/2020  . Type 2 diabetes mellitus without complication, without long-term current use  of insulin ([place]) 09/05/2019  . Trigger finger of left thumb 04/30/2019  . Infection of flexor tendon sheath 04/15/2019  . Carpal tunnel syndrome, bilateral 03/11/2019  . Essential hypertension 02/20/2019  . Type 2 diabetes mellitus with hyperglycemia, without long-term current use of insulin ([place]) 02/20/2019  . Other acute sinusitis 12/25/2018  . Irritable bowel syndrome 12/04/2018  . Migraine with aura 12/04/2018  . Common migraine with intractable migraine 08/29/2018  . Moderate persistent asthma without complication 62/26/3335  . Seasonal and perennial allergic rhinitis 06/28/2017  . Mild persistent asthma with acute exacerbation 06/28/2017  . Gastroesophageal reflux disease 06/28/2017  . Biliary dyskinesia 03/08/2017  . Asthma 03/02/2016  . Asthma with acute exacerbation 10/04/2015  . Other and unspecified ovarian cyst 06/17/2012   Past Medical History:  Diagnosis Date  . Acute renal disease   . Asthma   . Bilateral carpal tunnel syndrome 03/11/2019  . Common migraine with intractable migraine 08/29/2018  . Daily headache    (03/08/2017)  . Essential hypertension 02/20/2019  . GERD (gastroesophageal reflux disease)   . H pylori ulcer ~ 08/2016  . History of hiatal hernia   . Leaky heart valve ~ 09/2016  . Migraine    "maybe weekly" (03/08/2017)  . PONV (postoperative nausea and vomiting)   . Type 2 diabetes mellitus with hyperglycemia, without long-term current use of insulin ([place]) 02/20/2019   Past Surgical History:  Procedure Laterality Date  . CARPAL  TUNNEL RELEASE Left 12/18/2019   Procedure: CARPAL TUNNEL RELEASE;  Surgeon: [name], MD;  Location: [facility];  Service: Orthopedics;  Laterality: Left;  . CHOLECYSTECTOMY N/A 03/08/2017   Procedure: LAPAROSCOPIC CHOLECYSTECTOMY;  Surgeon: Coralie Keens, MD;  Location: [facility];  Service: General;  Laterality: N/A;  . DILATION AND CURETTAGE OF UTERUS    . LAPAROSCOPIC CHOLECYSTECTOMY  03/08/2017  . TRIGGER  FINGER RELEASE Left 12/18/2019   Procedure: RELEASE TRIGGER FINGER/A-1 PULLEY;  Surgeon: [name], MD;  Location: [facility];  Service: Orthopedics;  Laterality: Left;  . TUBAL LIGATION     Allergies  Allergen Reactions  . Ginger Itching and Swelling  . Passion Fruit Flavor [Flavoring Agent] Itching and Swelling  . Shellfish Allergy Anaphylaxis  . Other Other (See Comments)    ALLERGIC TO ALL TREE NUTS ALLERGIC TO ALL TREE NUTS  . Tamiflu [Oseltamivir Phosphate]    Prior to Admission medications   Medication Sig Start Date End Date Taking? Authorizing Provider  ACCU-CHEK GUIDE test strip USE TO CHECK GLUCOSE TWICE DAILY,FASTING AND 2 HOURS AFTER DINNER 05/09/20  Yes Rutherford Guys, MD  acetic acid-hydrocortisone (VOSOL-HC) OTIC solution Place 3 drops into the left ear 2 (two) times daily. 06/01/20  Yes Maximiano Coss, NP  albuterol (PROVENTIL) (2.5 MG/3ML) 0.083% nebulizer solution Take 3 mLs (2.5 mg total) by nebulization every 6 (six) hours as needed for wheezing or shortness of breath. 11/13/19  Yes Wendie Agreste, MD  ALBUTEROL SULFATE PO Take by mouth.   Yes [provider]  amLODipine (NORVASC) 2.5 MG tablet TAKE 1 TABLET BY MOUTH EVERY DAY 04/27/20  Yes Rutherford Guys, MD  BIOTIN PO Take by mouth.   Yes [provider]  blood glucose meter kit and supplies Per insurance preference. Use to check glucose twice a day (fasting and 2 hours after dinner)  Dx E11.65 10/30/18  Yes Rutherford Guys, MD  Cholecalciferol (VITAMIN D3 PO) Take by mouth.   Yes [provider]  Cyanocobalamin (VITAMIN B-12 PO) Take by mouth.   Yes [provider]  EPIPEN 2-PAK 0.3 MG/0.3ML SOAJ injection Inject 0.3 mg into the muscle as needed (for allergic reaction).  05/18/16  Yes [provider]  fluticasone (FLONASE) 50 MCG/ACT nasal spray USE 1 2 SPRAYS IN EACH NOSTRIL ONCE A DAY 10/25/18  Yes [provider]  Galcanezumab-gnlm  (EMGALITY) 120 MG/ML SOAJ Inject 120 mg into the skin every 30 (thirty) days. 10/21/19  Yes Suzzanne Cloud, NP  insulin glargine (LANTUS SOLOSTAR) 100 UNIT/ML Solostar Pen Inject 40 Units into the skin daily. 04/21/20  Yes Shamleffer, Melanie Crazier, MD  Insulin Pen Needle (PEN NEEDLES) 32G X 6 MM MISC Use once a day with lantus injection 02/13/20  Yes Rutherford Guys, MD  ketoconazole (NIZORAL) 2 % shampoo Apply 1 application topically 2 (two) times a week. Apply to affected area lather, leave in place for 5 mins and then rinse off with water 01/15/20  Yes Rutherford Guys, MD  levocetirizine (XYZAL) 5 MG tablet 1 2 TABLET IN THE EVENING ONCE A DAY ORALLY 15 11/05/18  Yes [provider]  meclizine (ANTIVERT) 12.5 MG tablet Take 1 tablet (12.5 mg total) by mouth 3 (three) times daily as needed for dizziness. 06/01/20  Yes Maximiano Coss, NP  medroxyPROGESTERone (DEPO-PROVERA) 150 MG/ML injection  08/15/18  Yes [provider]  methocarbamol (ROBAXIN) 500 MG tablet Take 1 tablet (500 mg total) by mouth every 8 (  eight) hours as needed for muscle spasms. 04/06/20  Yes Maximiano Coss, NP  montelukast (SINGULAIR) 10 MG tablet Take 10 mg by mouth at bedtime.   Yes [provider]  potassium chloride SA (KLOR-CON) 20 MEQ tablet Take 1 tablet (20 mEq total) by mouth daily. 06/02/20  Yes Maximiano Coss, NP  pregabalin (LYRICA) 100 MG capsule Take 1 capsule (100 mg total) by mouth 3 (three) times daily. 03/05/20  Yes Rutherford Guys, MD  rizatriptan (MAXALT) 10 MG tablet Take 1 tablet (10 mg total) by mouth 3 (three) times daily as needed for migraine. 05/20/20  Yes Suzzanne Cloud, NP  sitaGLIPtin (JANUVIA) 100 MG tablet Take 1 tablet (100 mg total) by mouth daily. 04/21/20  Yes Shamleffer, Melanie Crazier, MD  SYMBICORT 160-4.5 MCG/ACT inhaler INHALE 2 PUFFS INTO THE LUNGS DAILY. 05/09/18  Yes Valentina Shaggy, MD  VITAMIN E PO Take by mouth.   Yes [provider]  lidocaine  (XYLOCAINE) 5 % ointment Apply 1 application topically as needed. Patient not taking: Reported on 06/14/2020 05/10/20   Just, Laurita Quint, FNP  meloxicam (MOBIC) 15 MG tablet Take 1 tablet (15 mg total) by mouth daily. Patient not taking: Reported on 06/14/2020 04/26/20   Rutherford Guys, MD  olopatadine (PATANOL) 0.1 % ophthalmic solution Place 1 drop into both eyes 2 (two) times daily. Patient not taking: Reported on 06/14/2020 12/25/18   Maryruth Hancock, MD   Social History   Socioeconomic History  . Marital status: Significant Other    Spouse name: Not on file  . Number of children: 3  . Years of education: Not on file  . Highest education level: 12th grade  Occupational History    Comment: [employer]   Tobacco Use  . Smoking status: Never Smoker  . Smokeless tobacco: Never Used  Vaping Use  . Vaping Use: Never used  Substance and Sexual Activity  . Alcohol use: Not Currently    Comment: 03/08/2017 "might have a drink on a holiday"  . Drug use: No  . Sexual activity: Yes    Partners: Male    Birth control/protection: Injection  Other Topics Concern  . Not on file  Social History Narrative   ** Merged History Encounter **   Right handed    Lives at home with significant other 2 cups daily of caffeine    Social Determinants of Health   Financial Resource Strain:   . Difficulty of Paying Living Expenses: Not on file  Food Insecurity:   . Worried About Charity fundraiser in the Last Year: Not on file  . Ran Out of Food in the Last Year: Not on file  Transportation Needs:   . Lack of Transportation (Medical): Not on file  . Lack of Transportation (Non-Medical): Not on file  Physical Activity:   . Days of Exercise per Week: Not on file  . Minutes of Exercise per Session: Not on file  Stress:   . Feeling of Stress : Not on file  Social Connections:   . Frequency of Communication with Friends and Family: Not on file  . Frequency of Social Gatherings with Friends and  Family: Not on file  . Attends Religious Services: Not on file  . Active Member of Clubs or Organizations: Not on file  . Attends Archivist Meetings: Not on file  . Marital Status: Not on file  Intimate Partner Violence:   . Fear of Current or Ex-Partner: Not on file  .  Emotionally Abused: Not on file  . Physically Abused: Not on file  . Sexually Abused: Not on file    Review of Systems Per HPI.   Objective:   Vitals:   06/14/20 1500  BP: 119/80  Pulse: 93  Temp: 98.5 F (36.9 C)  TempSrc: Temporal  SpO2: 97%  Weight: 167 lb (75.8 kg)  Height: $Remove'5\' 4"'KvijlOx$  (1.626 m)     Physical Exam Constitutional:      General: She is not in acute distress.    Appearance: She is well-developed.  HENT:     Head: Normocephalic and atraumatic.      Left Ear: External ear normal. No decreased hearing noted. No mastoid tenderness. No hemotympanum. Tympanic membrane is not injected, perforated or erythematous.     Ears:     Comments: Left canal with small amount of white adherent exudate, slight erythema of canal.  No bleeding.  No black specks. TM dull, but no erythema or apparent fluid.  Pinna nontender with motion.  Mastoid nontender Cardiovascular:     Rate and Rhythm: Normal rate.  Pulmonary:     Effort: Pulmonary effort is normal.  Neurological:     General: No focal deficit present.     Mental Status: She is alert and oriented to person, place, and time.  Psychiatric:        Mood and Affect: Mood normal.        Behavior: Behavior normal.     38 minutes spent during visit, greater than 50% counseling and assimilation of information, chart review, and discussion of plan.     Assessment & Plan:  Sandra Moody is a 49 y.o. female . Left ear pain - Plan: ofloxacin (FLOXIN OTIC) 0.3 % OTIC solution, Ambulatory referral to ENT Itching of ear  -Persistent left canal irritation, does have some erythema, white adherent exudate, still possible bacterial otitis externa with  some initial improvement with Ciprodex.  Minimal change with VoSol HC.  Trial of Floxin otic with urgent eval by ENT.  I do not see black specks but differential includes fungal externa.  Popping sensation could also have component of eustachian tube dysfunction.  Continued on allergy treatments.  RTC/ER precautions given  Hypokalemia - Plan: Basic metabolic panel, potassium chloride (KLOR-CON) 10 MEQ tablet  -Switch to 10 mEq once per day due to pill size.  This plus dietary changes may be sufficient.  Check levels today as well as in 3 to 4 weeks.  Seborrheic dermatitis - Plan: ketoconazole (NIZORAL) 2 % cream, triamcinolone (KENALOG) 0.1 %  -Seborrhea of scalp has been well managed with ketoconazole shampoo.  Try ketoconazole topical cream to the lesions of the face as well as option of triamcinolone with potential risk of topical steroid including skin discoloration/pigmentation issues discussed.  Milder face cleanser also recommended.  RTC precautions  Meds ordered this encounter  Medications  . ofloxacin (FLOXIN OTIC) 0.3 % OTIC solution    Sig: Place 10 drops into the left ear daily.    Dispense:  10 mL    Refill:  0  . ketoconazole (NIZORAL) 2 % cream    Sig: Apply 1 application topically daily. To seborrhea/face lesion as needed.    Dispense:  15 g    Refill:  0  . triamcinolone (KENALOG) 0.1 %    Sig: Apply 1 application topically 2 (two) times daily. To affected areas on face, scalp as needed.    Dispense:  30 g    Refill:  0  .  potassium chloride (KLOR-CON) 10 MEQ tablet    Sig: Take 1 tablet (10 mEq total) by mouth daily.    Dispense:  30 tablet    Refill:  2   Patient Instructions      I will check can try decreasing to the 10 mEq tablet once per day.  Make sure to eat foods high in potassium.  Recheck levels in the next 3 to 4 weeks at follow-up visit.  I referred you to the ear nose and throat specialist, but for now can try new antibiotic 10 drops once per day.  If  any increasing or worsening pain, fevers, swelling, or change in hearing be seen right away.  Continue allergy medicines as some of the popping in the ear could be related to eustachian tube dysfunction or pressure within the middle ear.  Continue ketoconazole shampoo for scalp. Ketoconazole cream can be used on the patches on the face but I do recommend trying a milder face cleanser such as Cetaphil.  If you do have itching or irritated areas, I did write for a short supply of triamcinolone steroid cream, use only as needed.  Return to the clinic or go to the nearest emergency room if any of your symptoms worsen or new symptoms occur.    If you have lab work done today you will be contacted with your lab results within the next 2 weeks.  If you have not heard from [place] then please contact us. The fastest way to get your results is to register for My Chart.   IF you received an x-ray today, you will receive an invoice from [facility]. Please contact [facility] at [phone] with questions or concerns regarding your invoice.   IF you received labwork today, you will receive an invoice from [facility]. Please contact [facility] at [phone] with questions or concerns regarding your invoice.   Our billing staff will not be able to assist you with questions regarding bills from these companies.  You will be contacted with the lab results as soon as they are available. The fastest way to get your results is to activate your My Chart account. Instructions are located on the last page of this paperwork. If you have not heard from [place] regarding the results in 2 weeks, please contact this office.          Signed, Merri Ray, MD [facility]

## 2020-06-14 NOTE — Patient Instructions (Addendum)
    I will check can try decreasing to the 10 mEq tablet once per day.  Make sure to eat foods high in potassium.  Recheck levels in the next 3 to 4 weeks at follow-up visit.  I referred you to the ear nose and throat specialist, but for now can try new antibiotic 10 drops once per day.  If any increasing or worsening pain, fevers, swelling, or change in hearing be seen right away.  Continue allergy medicines as some of the popping in the ear could be related to eustachian tube dysfunction or pressure within the middle ear.  Continue ketoconazole shampoo for scalp. Ketoconazole cream can be used on the patches on the face but I do recommend trying a milder face cleanser such as Cetaphil.  If you do have itching or irritated areas, I did write for a short supply of triamcinolone steroid cream, use only as needed.  Return to the clinic or go to the nearest emergency room if any of your symptoms worsen or new symptoms occur.    If you have lab work done today you will be contacted with your lab results within the next 2 weeks.  If you have not heard from [place] then please contact us. The fastest way to get your results is to register for My Chart.   IF you received an x-ray today, you will receive an invoice from [facility]. Please contact [facility] at [phone] with questions or concerns regarding your invoice.   IF you received labwork today, you will receive an invoice from [facility]. Please contact [facility] at [phone] with questions or concerns regarding your invoice.   Our billing staff will not be able to assist you with questions regarding bills from these companies.  You will be contacted with the lab results as soon as they are available. The fastest way to get your results is to activate your My Chart account. Instructions are located on the last page of this paperwork. If you have not heard from [place] regarding the results in 2 weeks, please contact this office.

## 2020-06-15 LAB — BASIC METABOLIC PANEL
BUN/Creatinine Ratio: 8 — ABNORMAL LOW (ref 9–23)
BUN: 8 mg/dL (ref 6–24)
CO2: 21 mmol/L (ref 20–29)
Calcium: 9.1 mg/dL (ref 8.7–10.2)
Chloride: 108 mmol/L — ABNORMAL HIGH (ref 96–106)
Creatinine, Ser: 0.99 mg/dL (ref 0.57–1.00)
GFR calc Af Amer: 78 mL/min/{1.73_m2} (ref >59)
GFR calc non Af Amer: 68 mL/min/{1.73_m2} (ref >59)
Glucose: 237 mg/dL — ABNORMAL HIGH (ref 65–99)
Potassium: 4 mmol/L (ref 3.5–5.2)
Sodium: 143 mmol/L (ref 134–144)

## 2020-06-18 ENCOUNTER — Ambulatory Visit (INDEPENDENT_AMBULATORY_CARE_PROVIDER_SITE_OTHER): Payer: Managed Care, Other (non HMO) | Admitting: Otolaryngology

## 2020-06-18 ENCOUNTER — Other Ambulatory Visit: Payer: Self-pay

## 2020-06-18 ENCOUNTER — Encounter (INDEPENDENT_AMBULATORY_CARE_PROVIDER_SITE_OTHER): Payer: Self-pay | Admitting: Otolaryngology

## 2020-06-18 VITALS — Temp 97.5°F

## 2020-06-18 DIAGNOSIS — H6122 Impacted cerumen, left ear: Secondary | ICD-10-CM

## 2020-06-18 NOTE — Progress Notes (Signed)
HPI: Sandra Moody is a 49 y.o. female who presents is referred by Dr. Carlota Raspberry for evaluation of left ear infection that has been going on for a month now despite 3 different rounds of antibiotics..  She was most recently treated with Floxin eardrops and is doing better presently.  Past Medical History:  Diagnosis Date  . Acute renal disease   . Asthma   . Bilateral carpal tunnel syndrome 03/11/2019  . Common migraine with intractable migraine 08/29/2018  . Daily headache    (03/08/2017)  . Essential hypertension 02/20/2019  . GERD (gastroesophageal reflux disease)   . H pylori ulcer ~ 08/2016  . History of hiatal hernia   . Leaky heart valve ~ 09/2016  . Migraine    "maybe weekly" (03/08/2017)  . PONV (postoperative nausea and vomiting)   . Type 2 diabetes mellitus with hyperglycemia, without long-term current use of insulin ([place]) 02/20/2019   Past Surgical History:  Procedure Laterality Date  . CARPAL TUNNEL RELEASE Left 12/18/2019   Procedure: CARPAL TUNNEL RELEASE;  Surgeon: [name], MD;  Location: [facility];  Service: Orthopedics;  Laterality: Left;  . CHOLECYSTECTOMY N/A 03/08/2017   Procedure: LAPAROSCOPIC CHOLECYSTECTOMY;  Surgeon: Coralie Keens, MD;  Location: [facility];  Service: General;  Laterality: N/A;  . DILATION AND CURETTAGE OF UTERUS    . LAPAROSCOPIC CHOLECYSTECTOMY  03/08/2017  . TRIGGER FINGER RELEASE Left 12/18/2019   Procedure: RELEASE TRIGGER FINGER/A-1 PULLEY;  Surgeon: [name], MD;  Location: [facility];  Service: Orthopedics;  Laterality: Left;  . TUBAL LIGATION     Social History   Socioeconomic History  . Marital status: Significant Other    Spouse name: Not on file  . Number of children: 3  . Years of education: Not on file  . Highest education level: 12th grade  Occupational History    Comment: [employer]   Tobacco Use  . Smoking status: Never Smoker  . Smokeless tobacco: Never Used  Vaping Use  . Vaping  Use: Never used  Substance and Sexual Activity  . Alcohol use: Not Currently    Comment: 03/08/2017 "might have a drink on a holiday"  . Drug use: No  . Sexual activity: Yes    Partners: Male    Birth control/protection: Injection  Other Topics Concern  . Not on file  Social History Narrative   ** Merged History Encounter **   Right handed    Lives at home with significant other 2 cups daily of caffeine    Social Determinants of Health   Financial Resource Strain:   . Difficulty of Paying Living Expenses: Not on file  Food Insecurity:   . Worried About Charity fundraiser in the Last Year: Not on file  . Ran Out of Food in the Last Year: Not on file  Transportation Needs:   . Lack of Transportation (Medical): Not on file  . Lack of Transportation (Non-Medical): Not on file  Physical Activity:   . Days of Exercise per Week: Not on file  . Minutes of Exercise per Session: Not on file  Stress:   . Feeling of Stress : Not on file  Social Connections:   . Frequency of Communication with Friends and Family: Not on file  . Frequency of Social Gatherings with Friends and Family: Not on file  . Attends Religious Services: Not on file  . Active Member of Clubs or Organizations: Not on file  . Attends Archivist  Meetings: Not on file  . Marital Status: Not on file   Family History  Problem Relation Age of Onset  . Thyroid disease Mother   . Diabetes Maternal Grandmother   . Heart disease Maternal Grandfather    Allergies  Allergen Reactions  . Ginger Itching and Swelling  . Passion Fruit Flavor [Flavoring Agent] Itching and Swelling  . Shellfish Allergy Anaphylaxis  . Other Other (See Comments)    ALLERGIC TO ALL TREE NUTS ALLERGIC TO ALL TREE NUTS  . Tamiflu [Oseltamivir Phosphate]    Prior to Admission medications   Medication Sig Start Date End Date Taking? Authorizing Provider  ACCU-CHEK GUIDE test strip USE TO CHECK GLUCOSE TWICE DAILY,FASTING AND 2 HOURS  AFTER DINNER 05/09/20  Yes Rutherford Guys, MD  acetic acid-hydrocortisone (VOSOL-HC) OTIC solution Place 3 drops into the left ear 2 (two) times daily. 06/01/20  Yes Maximiano Coss, NP  albuterol (PROVENTIL) (2.5 MG/3ML) 0.083% nebulizer solution Take 3 mLs (2.5 mg total) by nebulization every 6 (six) hours as needed for wheezing or shortness of breath. 11/13/19  Yes Wendie Agreste, MD  ALBUTEROL SULFATE PO Take by mouth.   Yes [provider]  amLODipine (NORVASC) 2.5 MG tablet TAKE 1 TABLET BY MOUTH EVERY DAY 04/27/20  Yes Rutherford Guys, MD  BIOTIN PO Take by mouth.   Yes [provider]  blood glucose meter kit and supplies Per insurance preference. Use to check glucose twice a day (fasting and 2 hours after dinner)  Dx E11.65 10/30/18  Yes Rutherford Guys, MD  Cholecalciferol (VITAMIN D3 PO) Take by mouth.   Yes [provider]  Cyanocobalamin (VITAMIN B-12 PO) Take by mouth.   Yes [provider]  EPIPEN 2-PAK 0.3 MG/0.3ML SOAJ injection Inject 0.3 mg into the muscle as needed (for allergic reaction).  05/18/16  Yes [provider]  fluticasone (FLONASE) 50 MCG/ACT nasal spray USE 1 2 SPRAYS IN EACH NOSTRIL ONCE A DAY 10/25/18  Yes [provider]  Galcanezumab-gnlm (EMGALITY) 120 MG/ML SOAJ Inject 120 mg into the skin every 30 (thirty) days. 10/21/19  Yes Suzzanne Cloud, NP  insulin glargine (LANTUS SOLOSTAR) 100 UNIT/ML Solostar Pen Inject 40 Units into the skin daily. 04/21/20  Yes Shamleffer, Melanie Crazier, MD  Insulin Pen Needle (PEN NEEDLES) 32G X 6 MM MISC Use once a day with lantus injection 02/13/20  Yes Rutherford Guys, MD  ketoconazole (NIZORAL) 2 % cream Apply 1 application topically daily. To seborrhea/face lesion as needed. 06/14/20  Yes Wendie Agreste, MD  ketoconazole (NIZORAL) 2 % shampoo Apply 1 application topically 2 (two) times a week. Apply to affected area lather, leave in place for 5 mins and then rinse off with  water 01/15/20  Yes Rutherford Guys, MD  levocetirizine (XYZAL) 5 MG tablet 1 2 TABLET IN THE EVENING ONCE A DAY ORALLY 15 11/05/18  Yes [provider]  lidocaine (XYLOCAINE) 5 % ointment Apply 1 application topically as needed. 05/10/20  Yes Just, Laurita Quint, FNP  meclizine (ANTIVERT) 12.5 MG tablet Take 1 tablet (12.5 mg total) by mouth 3 (three) times daily as needed for dizziness. 06/01/20  Yes Maximiano Coss, NP  medroxyPROGESTERone (DEPO-PROVERA) 150 MG/ML injection  08/15/18  Yes [provider]  meloxicam (MOBIC) 15 MG tablet Take 1 tablet (15 mg total) by mouth daily. 04/26/20  Yes Rutherford Guys, MD  methocarbamol (ROBAXIN) 500 MG tablet Take 1 tablet (500 mg total) by mouth every 8 (  eight) hours as needed for muscle spasms. 04/06/20  Yes Maximiano Coss, NP  montelukast (SINGULAIR) 10 MG tablet Take 10 mg by mouth at bedtime.   Yes [provider]  ofloxacin (FLOXIN OTIC) 0.3 % OTIC solution Place 10 drops into the left ear daily. 06/14/20  Yes Wendie Agreste, MD  olopatadine (PATANOL) 0.1 % ophthalmic solution Place 1 drop into both eyes 2 (two) times daily. 12/25/18  Yes Corum, Rex Kras, MD  potassium chloride (KLOR-CON) 10 MEQ tablet Take 1 tablet (10 mEq total) by mouth daily. 06/14/20  Yes Wendie Agreste, MD  pregabalin (LYRICA) 100 MG capsule Take 1 capsule (100 mg total) by mouth 3 (three) times daily. 03/05/20  Yes Rutherford Guys, MD  rizatriptan (MAXALT) 10 MG tablet Take 1 tablet (10 mg total) by mouth 3 (three) times daily as needed for migraine. 05/20/20  Yes Suzzanne Cloud, NP  sitaGLIPtin (JANUVIA) 100 MG tablet Take 1 tablet (100 mg total) by mouth daily. 04/21/20  Yes Shamleffer, Melanie Crazier, MD  SYMBICORT 160-4.5 MCG/ACT inhaler INHALE 2 PUFFS INTO THE LUNGS DAILY. 05/09/18  Yes Valentina Shaggy, MD  triamcinolone (KENALOG) 0.1 % Apply 1 application topically 2 (two) times daily. To affected areas on face, scalp as needed. 06/14/20  Yes  Wendie Agreste, MD  VITAMIN E PO Take by mouth.   Yes [provider]     Positive ROS: Otherwise negative  All other systems have been reviewed and were otherwise negative with the exception of those mentioned in the HPI and as above.  Physical Exam: Constitutional: Alert, well-appearing, no acute distress Ears: External ears without lesions or tenderness.  Right ear canal is clear and TM is normal.  Left ear canal reveals a little bit of skin crusting and scaling in the ear canal that extended to the TM and this was removed with forceps.  The ear canal and TM is otherwise clear.  On hearing screening with a tuning fork she heard symmetric in both ears.  She is having no pain in the ear canal presently. Nasal: External nose without lesions.. Clear nasal passages Oral: Lips and gums without lesions. Tongue and palate mucosa without lesions. Posterior oropharynx clear. Neck: No palpable adenopathy or masses Respiratory: Breathing comfortably  Skin: No facial/neck lesions or rash noted.  Cerumen impaction removal  Date/Time: 06/18/2020 3:37 PM Performed by: Rozetta Nunnery, MD Authorized by: Rozetta Nunnery, MD   Consent:    Consent obtained:  Verbal   Consent given by:  Patient   Risks discussed:  Pain and bleeding Procedure details:    Location:  L ear   Procedure type: forceps   Post-procedure details:    Inspection:  TM intact and canal normal   Hearing quality:  Improved   Patient tolerance of procedure:  Tolerated well, no immediate complications Comments:     Patient with some scabbing and crusting in the left ear canal that was removed with forceps.  The ear canal and TM are otherwise clear with no signs of infection.    Assessment: Resolution of left ear infection.  Ear canal was cleaned in the office today and there is no active infection requiring further therapy.  Plan: Reassured patient of normal ear examination with resolution of  infection. She will follow-up as needed   Radene Journey, MD   CC:

## 2020-06-22 ENCOUNTER — Other Ambulatory Visit: Payer: Self-pay

## 2020-06-22 ENCOUNTER — Ambulatory Visit (INDEPENDENT_AMBULATORY_CARE_PROVIDER_SITE_OTHER): Payer: Managed Care, Other (non HMO) | Admitting: Neurology

## 2020-06-22 DIAGNOSIS — G5622 Lesion of ulnar nerve, left upper limb: Secondary | ICD-10-CM

## 2020-06-22 DIAGNOSIS — R202 Paresthesia of skin: Secondary | ICD-10-CM | POA: Diagnosis not present

## 2020-06-22 DIAGNOSIS — G5601 Carpal tunnel syndrome, right upper limb: Secondary | ICD-10-CM

## 2020-06-22 NOTE — Procedures (Signed)
[facility]  [address] [place], [address]  [place], [place] [address] Tel: [phone] Fax:  [phone] Test Date:  06/22/2020  Patient: Sandra Moody DOB: 1971-03-25 Physician: Nita Sickle, DO  Sex: Female Height: 5\' 4"  Ref Phys:  ID#: [id number] Temp: 33.0C Technician:    Patient Complaints: This is a 49 year old female referred for evaluation of hand paresthesias and pain.  NCV & EMG Findings: Extensive electrodiagnostic testing of the right upper extremity and additional studies of the left shows:  1. Right median sensory response shows prolonged peak latency (5.0 ms) and reduced amplitude (11.5 V).  Left median, left mixed palmar, and bilateral ulnar sensory responses are within normal limits, however the left ulnar sensory response is asymmetrically reduced as compared to the right. 2. Right median motor response shows latency (5.3 ms) and reduced amplitude (5.3 mV).  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  Left ulnar motor response shows slowed conduction velocity across the elbow (A Elbow-B Elbow, 45 m/s).  Right ulnar and left median motor responses are within normal limits.  3. Chronic motor axonal loss changes are isolated to the right abductor pollicis brevis muscle, without accompanied active denervation.   Impression: 1. 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   2. Left ulnar neuropathy with slowing across the elbow, predominantly demyelinating, mild.   ___________________________ 59, DO    Nerve Conduction Studies Anti Sensory Summary Table   Stim Site NR Peak (ms) Norm Peak (ms) P-T Amp (V) Norm P-T Amp  Left Median Anti Sensory (2nd Digit)  33C  Wrist    3.6 <3.4 43.4 >20  Right Median  Anti Sensory (2nd Digit)  33C  Wrist    5.0 <3.4 11.5 >20  Left Ulnar Anti Sensory (5th Digit)  33C  Wrist    2.2 <3.1 25.3 >12  Right Ulnar Anti Sensory (5th Digit)  33C  Wrist    2.7 <3.1 46.0 >12   Motor Summary Table   Stim Site NR Onset (ms) Norm Onset (ms) O-P Amp (mV) Norm O-P Amp Site1 Site2 Delta-0 (ms) Dist (cm) Vel (m/s) Norm Vel (m/s)  Left Median Motor (Abd Poll Brev)  33C  Wrist    3.5 <3.9 11.9 >6 Elbow Wrist 5.8 31.0 53 >50  Elbow    9.3  11.5         Right Median Motor (Abd Poll Brev)  33C  Wrist    5.3 <3.9 5.3 >6 Elbow Wrist 5.5 28.0 51 >50  Elbow    10.8  5.8  Ulnar-wrist crossover Elbow 6.0 0.0    Ulnar-wrist crossover    4.8  4.1         Left Ulnar Motor (Abd Dig Minimi)  33C  Wrist    2.4 <3.1 11.0 >7 B Elbow Wrist 4.0 23.0 58 >50  B Elbow    6.4  8.8  A Elbow B Elbow 2.2 10.0 45 >50  A Elbow    8.6  8.8         Right Ulnar Motor (Abd Dig Minimi)  33C  Wrist    2.3 <3.1 10.8 >7 B Elbow Wrist 4.1 23.0 56 >50  B Elbow    6.4  8.7  A Elbow B Elbow 1.8 10.0 56 >50  A Elbow    8.2  8.8          Comparison Summary Table   Stim Site NR Peak (ms) Norm Peak (ms) P-T Amp (V) Site1 Site2 Delta-P (ms) Norm Delta (ms)  Left Median/Ulnar Palm Comparison (Wrist - 8cm)  33C  Median Palm    2.0 <2.2 31.5 Median Palm Ulnar Palm 0.3   Ulnar Palm    1.7 <2.2 16.9       EMG   Side Muscle Ins Act Fibs Psw Fasc Number Recrt Dur Dur. Amp Amp. Poly Poly. Comment  Left 1stDorInt Nml Nml Nml Nml Nml Nml Nml Nml Nml Nml Nml Nml N/A  Left PronatorTeres Nml Nml Nml Nml Nml Nml Nml Nml Nml Nml Nml Nml N/A  Left Biceps Nml Nml Nml Nml Nml Nml Nml Nml Nml Nml Nml Nml N/A  Left Triceps Nml Nml Nml Nml Nml Nml Nml Nml Nml Nml Nml Nml N/A  Left Deltoid Nml Nml Nml Nml Nml Nml Nml Nml Nml Nml Nml Nml N/A  Left ABD Dig Min Nml Nml Nml Nml Nml Nml Nml Nml Nml Nml Nml Nml N/A  Left FlexCarpiUln Nml Nml Nml Nml Nml Nml Nml Nml Nml Nml Nml Nml N/A  Right 1stDorInt Nml Nml Nml Nml Nml  Nml Nml Nml Nml Nml Nml Nml N/A  Right PronatorTeres Nml Nml Nml Nml Nml Nml Nml Nml Nml Nml Nml Nml N/A  Right Biceps Nml Nml Nml Nml Nml Nml Nml Nml Nml Nml Nml Nml N/A  Right Abd Poll Brev Nml Nml Nml Nml 2- Rapid Many 1+ Many 1+ Some 1+ N/A  Right Triceps Nml Nml Nml Nml Nml Nml Nml Nml Nml Nml Nml Nml N/A      Waveforms:

## 2020-06-28 ENCOUNTER — Other Ambulatory Visit: Payer: Self-pay | Admitting: Family Medicine

## 2020-06-28 NOTE — Telephone Encounter (Signed)
   Notes to clinic:  medication was d/c in error Review for refill    Requested Prescriptions  Pending Prescriptions Disp Refills   clobetasol ointment (TEMOVATE) 0.05 % [Pharmacy Med Name: CLOBETASOL 0.05% OINTMENT] 30 g 0    Sig: APPLY TOPICALLY TWICE A DAY AS NEEDED FOR ITCHY RASH AS NEEDED      Dermatology:  Corticosteroids Passed - 06/28/2020  4:20 AM      Passed - Valid encounter within last 12 months    Recent Outpatient Visits           2 weeks ago Left ear pain   [facility], Asencion Partridge, MD   3 weeks ago Type 2 diabetes mellitus without complication, without long-term current use of insulin [facility])   [facility], Richard, NP   1 month ago Type 2 diabetes mellitus without complication, without long-term current use of insulin (HCC)   [facility], Azalee Course, FNP   2 months ago Acute pain of right shoulder   [facility], Meda Coffee, MD   2 months ago Lumbar back pain with radiculopathy affecting right lower extremity   [facility], Gerlene Burdock, NP       Future Appointments             In 1 week Neva Seat Asencion Partridge, MD [facility] at [place], [facility]

## 2020-07-02 ENCOUNTER — Encounter: Payer: Managed Care, Other (non HMO) | Admitting: Neurology

## 2020-07-07 ENCOUNTER — Encounter: Payer: Self-pay | Admitting: Family Medicine

## 2020-07-07 ENCOUNTER — Ambulatory Visit (INDEPENDENT_AMBULATORY_CARE_PROVIDER_SITE_OTHER): Payer: Managed Care, Other (non HMO) | Admitting: Family Medicine

## 2020-07-07 ENCOUNTER — Other Ambulatory Visit: Payer: Self-pay

## 2020-07-07 VITALS — BP 123/87 | HR 88 | Temp 98.5°F | Ht 64.0 in | Wt 164.0 lb

## 2020-07-07 DIAGNOSIS — R52 Pain, unspecified: Secondary | ICD-10-CM | POA: Diagnosis not present

## 2020-07-07 DIAGNOSIS — E876 Hypokalemia: Secondary | ICD-10-CM | POA: Diagnosis not present

## 2020-07-07 DIAGNOSIS — M545 Low back pain, unspecified: Secondary | ICD-10-CM | POA: Diagnosis not present

## 2020-07-07 DIAGNOSIS — T8089XD Other complications following infusion, transfusion and therapeutic injection, subsequent encounter: Secondary | ICD-10-CM | POA: Diagnosis not present

## 2020-07-07 MED ORDER — METHOCARBAMOL 500 MG PO TABS: 500.0000 mg | ORAL_TABLET | Freq: Three times a day (TID) | ORAL | 0 refills | Status: DC | PRN

## 2020-07-07 NOTE — Patient Instructions (Addendum)
Continue same dose potassium for now. That should be checked with your next bloodwork.   robaxin for back pain if needed, but try position change as we discussed, and see other treatments below.  Follow up if continued need for robaxin.  Return to the clinic or go to the nearest emergency room if any of your symptoms worsen or new symptoms occur.   See info on insulin injection sites, but discuss options with your endocrinologist. Can use over the counter lidocaine gel if needed.    The body absorbs insulin differently, depending on where the insulin is injected (injection site). It is best to inject insulin into the same body area each time (for example, always in the abdomen), but you should use a different spot in that area for each injection. Do not inject the insulin in the same spot each time. There are five main areas that can be used for injecting. These areas include:  Abdomen. This is the preferred area.  Front of thigh.  Upper, outer side of thigh.  Upper, outer side of arm.  Upper, outer part of buttock.     Acute Back Pain, Adult Acute back pain is sudden and usually short-lived. It is often caused by an injury to the muscles and tissues in the back. The injury may result from:  A muscle or ligament getting overstretched or torn (strained). Ligaments are tissues that connect bones to each other. Lifting something improperly can cause a back strain.  Wear and tear (degeneration) of the spinal disks. Spinal disks are circular tissue that provides cushioning between the bones of the spine (vertebrae).  Twisting motions, such as while playing sports or doing yard work.  A hit to the back.  Arthritis. You may have a physical exam, lab tests, and imaging tests to find the cause of your pain. Acute back pain usually goes away with rest and home care. Follow these instructions at home: Managing pain, stiffness, and swelling  Take over-the-counter and prescription medicines  only as told by your health care provider.  Your health care provider may recommend applying ice during the first 24-48 hours after your pain starts. To do this: ? Put ice in a plastic bag. ? Place a towel between your skin and the bag. ? Leave the ice on for 20 minutes, 2-3 times a day.  If directed, apply heat to the affected area as often as told by your health care provider. Use the heat source that your health care provider recommends, such as a moist heat pack or a heating pad. ? Place a towel between your skin and the heat source. ? Leave the heat on for 20-30 minutes. ? Remove the heat if your skin turns bright red. This is especially important if you are unable to feel pain, heat, or cold. You have a greater risk of getting burned. Activity   Do not stay in bed. Staying in bed for more than 1-2 days can delay your recovery.  Sit up and stand up straight. Avoid leaning forward when you sit, or hunching over when you stand. ? If you work at a desk, sit close to it so you do not need to lean over. Keep your chin tucked in. Keep your neck drawn back, and keep your elbows bent at a right angle. Your arms should look like the letter "L." ? Sit high and close to the steering wheel when you drive. Add lower back (lumbar) support to your car seat, if needed.  Take  short walks on even surfaces as soon as you are able. Try to increase the length of time you walk each day.  Do not sit, drive, or stand in one place for more than 30 minutes at a time. Sitting or standing for long periods of time can put stress on your back.  Do not drive or use heavy machinery while taking prescription pain medicine.  Use proper lifting techniques. When you bend and lift, use positions that put less stress on your back: ? Landing your knees. ? Keep the load close to your body. ? Avoid twisting.  Exercise regularly as told by your health care provider. Exercising helps your back heal faster and helps prevent  back injuries by keeping muscles strong and flexible.  Work with a physical therapist to make a safe exercise program, as recommended by your health care provider. Do any exercises as told by your physical therapist. Lifestyle  Maintain a healthy weight. Extra weight puts stress on your back and makes it difficult to have good posture.  Avoid activities or situations that make you feel anxious or stressed. Stress and anxiety increase muscle tension and can make back pain worse. Learn ways to manage anxiety and stress, such as through exercise. General instructions  Sleep on a firm mattress in a comfortable position. Try lying on your side with your knees slightly bent. If you lie on your back, put a pillow under your knees.  Follow your treatment plan as told by your health care provider. This may include: ? Cognitive or behavioral therapy. ? Acupuncture or massage therapy. ? Meditation or yoga. Contact a health care provider if:  You have pain that is not relieved with rest or medicine.  You have increasing pain going down into your legs or buttocks.  Your pain does not improve after 2 weeks.  You have pain at night.  You lose weight without trying.  You have a fever or chills. Get help right away if:  You develop new bowel or bladder control problems.  You have unusual weakness or numbness in your arms or legs.  You develop nausea or vomiting.  You develop abdominal pain.  You feel faint. Summary  Acute back pain is sudden and usually short-lived.  Use proper lifting techniques. When you bend and lift, use positions that put less stress on your back.  Take over-the-counter and prescription medicines and apply heat or ice as directed by your health care provider. This information is not intended to replace advice given to you by your health care provider. Make sure you discuss any questions you have with your health care provider. Document Revised: 11/05/2018 Document  Reviewed: 02/28/2017 Elsevier Patient Education  [employer].   If you have lab work done today you will be contacted with your lab results within the next 2 weeks.  If you have not heard from [place] then please contact us. The fastest way to get your results is to register for My Chart.   IF you received an x-ray today, you will receive an invoice from [facility]. Please contact [facility] at [phone] with questions or concerns regarding your invoice.   IF you received labwork today, you will receive an invoice from [place]. Please contact [facility] at [phone] with questions or concerns regarding your invoice.   Our billing staff will not be able to assist you with questions regarding bills from these companies.  You will be contacted with the lab results as soon as they are  available. The fastest way to get your results is to activate your My Chart account. Instructions are located on the last page of this paperwork. If you have not heard from [place] regarding the results in 2 weeks, please contact this office.

## 2020-07-07 NOTE — Progress Notes (Signed)
Subjective:  Patient ID: Sandra Moody, female    DOB: 08-Oct-1970  Age: 49 y.o. MRN: [id number]  CC:  Chief Complaint  Patient presents with  . Follow-up    on L ear pain and Hypokalemia. PT reports that she is no longer having the pain in her ear any longer.  . Medication Refill    Pt is wondering if she can get a refill on her Methocarbamol for her back pain. pt also states that her insurance wouldn't cover Lidicane cream and was wanting an alternitive so she can give herself her injections.    HPI Sandra Moody presents for   Left ear pain: Discussed at November 15 visit.  Possible bacterial otitis externa with some initial improvement with Ciprodex.  Trial of Floxin otic at that time.  Recommended ENT eval.  Possible component of eustachian tube dysfunction.  Ear pain has improved. Saw ENT - no infection seen - removed scab in ear.   Hypokalemia Stable on November 15 visit.  Was changed to 10 mEq potassium supplement.on 64mq QD.  Working better.  Lab Results  Component Value Date   K 4.0 06/14/2020   Back pain: Discussed in September with Dr. SPamella Pert  Improving after Voltaren gel, heat pad and methocarbamol.  3-day history of upper shoulder, neck and upper arm pain at that time. Has taken robaxin in pas up to every few days of low back sore.  Trying to prep for shoulder surgery 07/22/20. Sleeping in recliner. Low back is more sore. Has robaxin in past - helped back in past.  Similar back pain as in past.  No bowel or bladder incontinence, no saddle anesthesia, no lower extremity weakness.   Injection site pain: Lidocaine ointment has been discussed previously for injection site discomfort prior to insulin injection. Insurance did not cover. Sore areas on abdomen where injected prior. Only injecting in abdomen.    History Patient Active Problem List   Diagnosis Date Noted  . Entrapment of left ulnar nerve 04/28/2020  . Diabetic polyneuropathy associated with type 2  diabetes mellitus ([facility] 03/05/2020  . Type 2 diabetes mellitus without complication, without long-term current use of insulin ([facility] 09/05/2019  . Trigger finger of left thumb 04/30/2019  . Infection of flexor tendon sheath 04/15/2019  . Carpal tunnel syndrome, bilateral 03/11/2019  . Essential hypertension 02/20/2019  . Type 2 diabetes mellitus with hyperglycemia, without long-term current use of insulin ([facility] 02/20/2019  . Other acute sinusitis 12/25/2018  . Irritable bowel syndrome 12/04/2018  . Migraine with aura 12/04/2018  . Common migraine with intractable migraine 08/29/2018  . Moderate persistent asthma without complication [id number] . Seasonal and perennial allergic rhinitis 06/28/2017  . Mild persistent asthma with acute exacerbation 06/28/2017  . Gastroesophageal reflux disease 06/28/2017  . Biliary dyskinesia 03/08/2017  . Asthma 03/02/2016  . Asthma with acute exacerbation 10/04/2015  . Other and unspecified ovarian cyst 06/17/2012   Past Medical History:  Diagnosis Date  . Acute renal disease   . Asthma   . Bilateral carpal tunnel syndrome 03/11/2019  . Common migraine with intractable migraine 08/29/2018  . Daily headache    (03/08/2017)  . Essential hypertension 02/20/2019  . GERD (gastroesophageal reflux disease)   . H pylori ulcer ~ 08/2016  . History of hiatal hernia   . Leaky heart valve ~ 09/2016  . Migraine    "maybe weekly" (03/08/2017)  . PONV (postoperative nausea and vomiting)   . Type 2 diabetes mellitus with hyperglycemia, without  long-term current use of insulin ([place]) 02/20/2019   Past Surgical History:  Procedure Laterality Date  . CARPAL TUNNEL RELEASE Left 12/18/2019   Procedure: CARPAL TUNNEL RELEASE;  Surgeon: [name], MD;  Location: [facility];  Service: Orthopedics;  Laterality: Left;  . CHOLECYSTECTOMY N/A 03/08/2017   Procedure: LAPAROSCOPIC CHOLECYSTECTOMY;  Surgeon: Coralie Keens, MD;  Location: [facility];  Service: General;   Laterality: N/A;  . DILATION AND CURETTAGE OF UTERUS    . LAPAROSCOPIC CHOLECYSTECTOMY  03/08/2017  . TRIGGER FINGER RELEASE Left 12/18/2019   Procedure: RELEASE TRIGGER FINGER/A-1 PULLEY;  Surgeon: [name], MD;  Location: [facility];  Service: Orthopedics;  Laterality: Left;  . TUBAL LIGATION     Allergies  Allergen Reactions  . Ginger Itching and Swelling  . Passion Fruit Flavor [Flavoring Agent] Itching and Swelling  . Shellfish Allergy Anaphylaxis  . Other Other (See Comments)    ALLERGIC TO ALL TREE NUTS ALLERGIC TO ALL TREE NUTS  . Tamiflu [Oseltamivir Phosphate]    Prior to Admission medications   Medication Sig Start Date End Date Taking? Authorizing Provider  ACCU-CHEK GUIDE test strip USE TO CHECK GLUCOSE TWICE DAILY,FASTING AND 2 HOURS AFTER DINNER 05/09/20  Yes Rutherford Guys, MD  acetic acid-hydrocortisone (VOSOL-HC) OTIC solution Place 3 drops into the left ear 2 (two) times daily. 06/01/20  Yes Maximiano Coss, NP  albuterol (PROVENTIL) (2.5 MG/3ML) 0.083% nebulizer solution Take 3 mLs (2.5 mg total) by nebulization every 6 (six) hours as needed for wheezing or shortness of breath. 11/13/19  Yes Wendie Agreste, MD  ALBUTEROL SULFATE PO Take by mouth.   Yes [provider]  amLODipine (NORVASC) 2.5 MG tablet TAKE 1 TABLET BY MOUTH EVERY DAY 04/27/20  Yes Rutherford Guys, MD  BIOTIN PO Take by mouth.   Yes [provider]  blood glucose meter kit and supplies Per insurance preference. Use to check glucose twice a day (fasting and 2 hours after dinner)  Dx E11.65 10/30/18  Yes Rutherford Guys, MD  Cholecalciferol (VITAMIN D3 PO) Take by mouth.   Yes [provider]  clobetasol ointment (TEMOVATE) 0.05 % APPLY TOPICALLY TWICE A DAY AS NEEDED FOR ITCHY RASH AS NEEDED 06/28/20  Yes Wendie Agreste, MD  Cyanocobalamin (VITAMIN B-12 PO) Take by mouth.   Yes [provider]  EPIPEN 2-PAK 0.3 MG/0.3ML SOAJ injection Inject  0.3 mg into the muscle as needed (for allergic reaction).  05/18/16  Yes [provider]  fluticasone (FLONASE) 50 MCG/ACT nasal spray USE 1 2 SPRAYS IN EACH NOSTRIL ONCE A DAY 10/25/18  Yes [provider]  Galcanezumab-gnlm (EMGALITY) 120 MG/ML SOAJ Inject 120 mg into the skin every 30 (thirty) days. 10/21/19  Yes Suzzanne Cloud, NP  insulin glargine (LANTUS SOLOSTAR) 100 UNIT/ML Solostar Pen Inject 40 Units into the skin daily. 04/21/20  Yes Shamleffer, Melanie Crazier, MD  Insulin Pen Needle (PEN NEEDLES) 32G X 6 MM MISC Use once a day with lantus injection 02/13/20  Yes Rutherford Guys, MD  ketoconazole (NIZORAL) 2 % cream Apply 1 application topically daily. To seborrhea/face lesion as needed. 06/14/20  Yes Wendie Agreste, MD  ketoconazole (NIZORAL) 2 % shampoo Apply 1 application topically 2 (two) times a week. Apply to affected area lather, leave in place for 5 mins and then rinse off with water 01/15/20  Yes Rutherford Guys, MD  levocetirizine (XYZAL) 5 MG tablet 1 2 TABLET IN THE EVENING ONCE  A DAY ORALLY 15 11/05/18  Yes [provider]  lidocaine (XYLOCAINE) 5 % ointment Apply 1 application topically as needed. 05/10/20  Yes Just, Laurita Quint, FNP  meclizine (ANTIVERT) 12.5 MG tablet Take 1 tablet (12.5 mg total) by mouth 3 (three) times daily as needed for dizziness. 06/01/20  Yes Maximiano Coss, NP  medroxyPROGESTERone (DEPO-PROVERA) 150 MG/ML injection  08/15/18  Yes [provider]  meloxicam (MOBIC) 15 MG tablet Take 1 tablet (15 mg total) by mouth daily. 04/26/20  Yes Rutherford Guys, MD  montelukast (SINGULAIR) 10 MG tablet Take 10 mg by mouth at bedtime.   Yes [provider]  ofloxacin (FLOXIN OTIC) 0.3 % OTIC solution Place 10 drops into the left ear daily. 06/14/20  Yes Wendie Agreste, MD  olopatadine (PATANOL) 0.1 % ophthalmic solution Place 1 drop into both eyes 2 (two) times daily. 12/25/18  Yes Corum, Rex Kras, MD  potassium chloride  (KLOR-CON) 10 MEQ tablet Take 1 tablet (10 mEq total) by mouth daily. 06/14/20  Yes Wendie Agreste, MD  pregabalin (LYRICA) 100 MG capsule Take 1 capsule (100 mg total) by mouth 3 (three) times daily. 03/05/20  Yes Rutherford Guys, MD  rizatriptan (MAXALT) 10 MG tablet Take 1 tablet (10 mg total) by mouth 3 (three) times daily as needed for migraine. 05/20/20  Yes Suzzanne Cloud, NP  sitaGLIPtin (JANUVIA) 100 MG tablet Take 1 tablet (100 mg total) by mouth daily. 04/21/20  Yes Shamleffer, Melanie Crazier, MD  SYMBICORT 160-4.5 MCG/ACT inhaler INHALE 2 PUFFS INTO THE LUNGS DAILY. 05/09/18  Yes Valentina Shaggy, MD  triamcinolone (KENALOG) 0.1 % Apply 1 application topically 2 (two) times daily. To affected areas on face, scalp as needed. 06/14/20  Yes Wendie Agreste, MD  VITAMIN E PO Take by mouth.   Yes [provider]  methocarbamol (ROBAXIN) 500 MG tablet Take 1 tablet (500 mg total) by mouth every 8 (eight) hours as needed for muscle spasms. Patient not taking: Reported on 07/07/2020 04/06/20   Maximiano Coss, NP   Social History   Socioeconomic History  . Marital status: Significant Other    Spouse name: Not on file  . Number of children: 3  . Years of education: Not on file  . Highest education level: 12th grade  Occupational History    Comment: [employer]   Tobacco Use  . Smoking status: Never Smoker  . Smokeless tobacco: Never Used  Vaping Use  . Vaping Use: Never used  Substance and Sexual Activity  . Alcohol use: Not Currently    Comment: 03/08/2017 "might have a drink on a holiday"  . Drug use: No  . Sexual activity: Yes    Partners: Male    Birth control/protection: Injection  Other Topics Concern  . Not on file  Social History Narrative   ** Merged History Encounter **   Right handed    Lives at home with significant other 2 cups daily of caffeine    Social Determinants of Health   Financial Resource Strain:   . Difficulty of Paying Living  Expenses: Not on file  Food Insecurity:   . Worried About Charity fundraiser in the Last Year: Not on file  . Ran Out of Food in the Last Year: Not on file  Transportation Needs:   . Lack of Transportation (Medical): Not on file  . Lack of Transportation (Non-Medical): Not on file  Physical Activity:   . Days of Exercise per  Week: Not on file  . Minutes of Exercise per Session: Not on file  Stress:   . Feeling of Stress : Not on file  Social Connections:   . Frequency of Communication with Friends and Family: Not on file  . Frequency of Social Gatherings with Friends and Family: Not on file  . Attends Religious Services: Not on file  . Active Member of Clubs or Organizations: Not on file  . Attends Archivist Meetings: Not on file  . Marital Status: Not on file  Intimate Partner Violence:   . Fear of Current or Ex-Partner: Not on file  . Emotionally Abused: Not on file  . Physically Abused: Not on file  . Sexually Abused: Not on file    Review of Systems   Objective:   Vitals:   07/07/20 1530  BP: 123/87  Pulse: 88  Temp: 98.5 F (36.9 C)  TempSrc: Temporal  Weight: 164 lb (74.4 kg)  Height: 5' 4"  (1.626 m)     Physical Exam Vitals reviewed.  Constitutional:      General: She is not in acute distress.    Appearance: She is well-developed.  HENT:     Head: Normocephalic and atraumatic.  Cardiovascular:     Rate and Rhythm: Normal rate.  Pulmonary:     Effort: Pulmonary effort is normal.  Abdominal:     General: Abdomen is flat. There is no distension.     Comments: Abdominal wall without erythema, ecchymosis, or nodularity palpated.  Musculoskeletal:     Comments: Lumbar spine, right paraspinals tender to palpation, no midline bony tenderness, negative seated straight leg raise.  Neurological:     Mental Status: She is alert and oriented to person, place, and time.        Assessment & Plan:  Sandra Moody is a 49 y.o. female . Pain at  injection site, subsequent encounter  -Alternate injection sites provided on handout but did discuss potential decreased absorption from those sites, and recommended discussing with her endocrinologist.  Over-the-counter lidocaine topical may be beneficial as well.   Bilateral low back pain, unspecified chronicity, unspecified whether sciatica present - Plan: methocarbamol (ROBAXIN) 500 MG tablet  -Similar back pains in past, no known injury.  Likely positional with her trying to adjust to sleeping in recliner for upcoming shoulder surgery.  Lumbar support discussed with pillow or other adjustments in recliner, other positioning techniques discussed.  Symptomatic care discussed for low back pain with handout given, and refill of methocarbamol for now.  RTC precautions if persistent  Hypokalemia  -Tolerating 64mq tablet. plan for recheck potassium at her next blood draw with endocrinology recommended.  Blood work deferred today.  Meds ordered this encounter  Medications  . methocarbamol (ROBAXIN) 500 MG tablet    Sig: Take 1 tablet (500 mg total) by mouth every 8 (eight) hours as needed for muscle spasms.    Dispense:  60 tablet    Refill:  0   Patient Instructions   Continue same dose potassium for now. That should be checked with your next bloodwork.   robaxin for back pain if needed, but try position change as we discussed, and see other treatments below.  Follow up if continued need for robaxin.  Return to the clinic or go to the nearest emergency room if any of your symptoms worsen or new symptoms occur.   See info on insulin injection sites, but discuss options with your endocrinologist. Can use over the counter lidocaine gel  if needed.    The body absorbs insulin differently, depending on where the insulin is injected (injection site). It is best to inject insulin into the same body area each time (for example, always in the abdomen), but you should use a different spot in that area  for each injection. Do not inject the insulin in the same spot each time. There are five main areas that can be used for injecting. These areas include:  Abdomen. This is the preferred area.  Front of thigh.  Upper, outer side of thigh.  Upper, outer side of arm.  Upper, outer part of buttock.     Acute Back Pain, Adult Acute back pain is sudden and usually short-lived. It is often caused by an injury to the muscles and tissues in the back. The injury may result from:  A muscle or ligament getting overstretched or torn (strained). Ligaments are tissues that connect bones to each other. Lifting something improperly can cause a back strain.  Wear and tear (degeneration) of the spinal disks. Spinal disks are circular tissue that provides cushioning between the bones of the spine (vertebrae).  Twisting motions, such as while playing sports or doing yard work.  A hit to the back.  Arthritis. You may have a physical exam, lab tests, and imaging tests to find the cause of your pain. Acute back pain usually goes away with rest and home care. Follow these instructions at home: Managing pain, stiffness, and swelling  Take over-the-counter and prescription medicines only as told by your health care provider.  Your health care provider may recommend applying ice during the first 24-48 hours after your pain starts. To do this: ? Put ice in a plastic bag. ? Place a towel between your skin and the bag. ? Leave the ice on for 20 minutes, 2-3 times a day.  If directed, apply heat to the affected area as often as told by your health care provider. Use the heat source that your health care provider recommends, such as a moist heat pack or a heating pad. ? Place a towel between your skin and the heat source. ? Leave the heat on for 20-30 minutes. ? Remove the heat if your skin turns bright red. This is especially important if you are unable to feel pain, heat, or cold. You have a greater risk of  getting burned. Activity   Do not stay in bed. Staying in bed for more than 1-2 days can delay your recovery.  Sit up and stand up straight. Avoid leaning forward when you sit, or hunching over when you stand. ? If you work at a desk, sit close to it so you do not need to lean over. Keep your chin tucked in. Keep your neck drawn back, and keep your elbows bent at a right angle. Your arms should look like the letter "L." ? Sit high and close to the steering wheel when you drive. Add lower back (lumbar) support to your car seat, if needed.  Take short walks on even surfaces as soon as you are able. Try to increase the length of time you walk each day.  Do not sit, drive, or stand in one place for more than 30 minutes at a time. Sitting or standing for long periods of time can put stress on your back.  Do not drive or use heavy machinery while taking prescription pain medicine.  Use proper lifting techniques. When you bend and lift, use positions that put less stress on  your back: ? Bend your knees. ? Keep the load close to your body. ? Avoid twisting.  Exercise regularly as told by your health care provider. Exercising helps your back heal faster and helps prevent back injuries by keeping muscles strong and flexible.  Work with a physical therapist to make a safe exercise program, as recommended by your health care provider. Do any exercises as told by your physical therapist. Lifestyle  Maintain a healthy weight. Extra weight puts stress on your back and makes it difficult to have good posture.  Avoid activities or situations that make you feel anxious or stressed. Stress and anxiety increase muscle tension and can make back pain worse. Learn ways to manage anxiety and stress, such as through exercise. General instructions  Sleep on a firm mattress in a comfortable position. Try lying on your side with your knees slightly bent. If you lie on your back, put a pillow under your  knees.  Follow your treatment plan as told by your health care provider. This may include: ? Cognitive or behavioral therapy. ? Acupuncture or massage therapy. ? Meditation or yoga. Contact a health care provider if:  You have pain that is not relieved with rest or medicine.  You have increasing pain going down into your legs or buttocks.  Your pain does not improve after 2 weeks.  You have pain at night.  You lose weight without trying.  You have a fever or chills. Get help right away if:  You develop new bowel or bladder control problems.  You have unusual weakness or numbness in your arms or legs.  You develop nausea or vomiting.  You develop abdominal pain.  You feel faint. Summary  Acute back pain is sudden and usually short-lived.  Use proper lifting techniques. When you bend and lift, use positions that put less stress on your back.  Take over-the-counter and prescription medicines and apply heat or ice as directed by your health care provider. This information is not intended to replace advice given to you by your health care provider. Make sure you discuss any questions you have with your health care provider. Document Revised: 11/05/2018 Document Reviewed: 02/28/2017 Elsevier Patient Education  [employer].   If you have lab work done today you will be contacted with your lab results within the next 2 weeks.  If you have not heard from [place] then please contact us. The fastest way to get your results is to register for My Chart.   IF you received an x-ray today, you will receive an invoice from [facility]. Please contact [facility] at [phone] with questions or concerns regarding your invoice.   IF you received labwork today, you will receive an invoice from [facility]. Please contact [facility] at [phone] with questions or concerns regarding your invoice.   Our billing staff will not be able to assist you with questions  regarding bills from these companies.  You will be contacted with the lab results as soon as they are available. The fastest way to get your results is to activate your My Chart account. Instructions are located on the last page of this paperwork. If you have not heard from [place] regarding the results in 2 weeks, please contact this office.         Signed, Merri Ray, MD [facility]

## 2020-07-15 ENCOUNTER — Encounter (HOSPITAL_BASED_OUTPATIENT_CLINIC_OR_DEPARTMENT_OTHER): Payer: Self-pay | Admitting: Orthopedic Surgery

## 2020-07-15 ENCOUNTER — Other Ambulatory Visit: Payer: Self-pay

## 2020-07-18 NOTE — H&P (Signed)
PREOPERATIVE H&P  Chief Complaint: RIGHT CARPAL TUNNEL SYNDROME  HPI: Sandra Moody is a 49 y.o. female who is scheduled for, Procedure(s): CARPAL TUNNEL RELEASE SHOULDER ARTHROSCOPY WITH SUBACROMIAL DECOMPRESSION AND DISTAL CLAVICLE EXCISION, ROTATOR CUFF REPAIR AND BICEPS TENODESIS.   Patient has a past medical history significant for type 2 diabetes, PONV, GERD, HTN.   The patient is a 49 year old who we have been treating for her right shoulder pain.  She has been treated conservatively with injection and physical therapy. She got minimal relief from the injection. She reports constant throbbing sensation.  She is going to physical therapy, but she does not know if that is helping.  She is unhappy with her function thus far. She is scheduled for carpal tunnel release with Dr. Fredna Dow.   Her symptoms are rated as moderate to severe, and have been worsening.  This is significantly impairing activities of daily living.    Please see clinic note for further details on this patient's care.    She has elected for surgical management.   Past Medical History:  Diagnosis Date  . Acute renal disease   . Asthma   . Bilateral carpal tunnel syndrome 03/11/2019  . Common migraine with intractable migraine 08/29/2018  . Daily headache    (03/08/2017)  . Essential hypertension 02/20/2019  . GERD (gastroesophageal reflux disease)   . H pylori ulcer ~ 08/2016  . History of hiatal hernia   . Leaky heart valve ~ 09/2016  . Migraine    "maybe weekly" (03/08/2017)  . PONV (postoperative nausea and vomiting)   . Type 2 diabetes mellitus with hyperglycemia, without long-term current use of insulin ([place]) 02/20/2019   Past Surgical History:  Procedure Laterality Date  . CARPAL TUNNEL RELEASE Left 12/18/2019   Procedure: CARPAL TUNNEL RELEASE;  Surgeon: [name], MD;  Location: [facility];  Service: Orthopedics;  Laterality: Left;  . CHOLECYSTECTOMY N/A 03/08/2017   Procedure:  LAPAROSCOPIC CHOLECYSTECTOMY;  Surgeon: Coralie Keens, MD;  Location: [facility];  Service: General;  Laterality: N/A;  . DILATION AND CURETTAGE OF UTERUS    . LAPAROSCOPIC CHOLECYSTECTOMY  03/08/2017  . TRIGGER FINGER RELEASE Left 12/18/2019   Procedure: RELEASE TRIGGER FINGER/A-1 PULLEY;  Surgeon: [name], MD;  Location: [facility];  Service: Orthopedics;  Laterality: Left;  . TUBAL LIGATION     Social History   Socioeconomic History  . Marital status: Significant Other    Spouse name: Not on file  . Number of children: 3  . Years of education: Not on file  . Highest education level: 12th grade  Occupational History    Comment: [employer]   Tobacco Use  . Smoking status: Never Smoker  . Smokeless tobacco: Never Used  Vaping Use  . Vaping Use: Never used  Substance and Sexual Activity  . Alcohol use: Not Currently    Comment: 03/08/2017 "might have a drink on a holiday"  . Drug use: No  . Sexual activity: Yes    Partners: Male    Birth control/protection: Injection  Other Topics Concern  . Not on file  Social History Narrative   ** Merged History Encounter **   Right handed    Lives at home with significant other 2 cups daily of caffeine    Social Determinants of Health   Financial Resource Strain: Not on file  Food Insecurity: Not on file  Transportation Needs: Not on file  Physical Activity: Not on file  Stress: Not  on file  Social Connections: Not on file   Family History  Problem Relation Age of Onset  . Thyroid disease Mother   . Diabetes Maternal Grandmother   . Heart disease Maternal Grandfather    Allergies  Allergen Reactions  . Ginger Itching and Swelling  . Passion Fruit Flavor [Flavoring Agent] Itching and Swelling  . Shellfish Allergy Anaphylaxis  . Other Other (See Comments)    ALLERGIC TO ALL TREE NUTS ALLERGIC TO ALL TREE NUTS  . Tamiflu [Oseltamivir Phosphate]    Prior to Admission medications   Medication Sig Start  Date End Date Taking? Authorizing Provider  ACCU-CHEK GUIDE test strip USE TO CHECK GLUCOSE TWICE DAILY,FASTING AND 2 HOURS AFTER DINNER 05/09/20  Yes Jacelyn Pi, Irma M, MD  ALBUTEROL SULFATE PO Take by mouth.   Yes [provider]  amLODipine (NORVASC) 2.5 MG tablet TAKE 1 TABLET BY MOUTH EVERY DAY 04/27/20  Yes Jacelyn Pi, Lilia Argue, MD  BIOTIN PO Take by mouth.   Yes [provider]  blood glucose meter kit and supplies Per insurance preference. Use to check glucose twice a day (fasting and 2 hours after dinner)  Dx E11.65 10/30/18  Yes Jacelyn Pi, Lilia Argue, MD  Cholecalciferol (VITAMIN D3 PO) Take by mouth.   Yes [provider]  clobetasol ointment (TEMOVATE) 0.05 % APPLY TOPICALLY TWICE A DAY AS NEEDED FOR ITCHY RASH AS NEEDED 06/28/20  Yes Wendie Agreste, MD  fluticasone (FLONASE) 50 MCG/ACT nasal spray USE 1 2 SPRAYS IN EACH NOSTRIL ONCE A DAY 10/25/18  Yes [provider]  Galcanezumab-gnlm (EMGALITY) 120 MG/ML SOAJ Inject 120 mg into the skin every 30 (thirty) days. 10/21/19  Yes Suzzanne Cloud, NP  insulin glargine (LANTUS SOLOSTAR) 100 UNIT/ML Solostar Pen Inject 40 Units into the skin daily. 04/21/20  Yes Shamleffer, Melanie Crazier, MD  Insulin Pen Needle (PEN NEEDLES) 32G X 6 MM MISC Use once a day with lantus injection 02/13/20  Yes Jacelyn Pi, Lilia Argue, MD  ketoconazole (NIZORAL) 2 % cream Apply 1 application topically daily. To seborrhea/face lesion as needed. 06/14/20  Yes Wendie Agreste, MD  ketoconazole (NIZORAL) 2 % shampoo Apply 1 application topically 2 (two) times a week. Apply to affected area lather, leave in place for 5 mins and then rinse off with water 01/15/20  Yes Jacelyn Pi, Irma M, MD  levocetirizine (XYZAL) 5 MG tablet 1 2 TABLET IN THE EVENING ONCE A DAY ORALLY 15 11/05/18  Yes [provider]  methocarbamol (ROBAXIN) 500 MG tablet Take 1 tablet (500 mg total) by mouth every 8 (eight) hours as needed for muscle  spasms. 07/07/20  Yes Wendie Agreste, MD  montelukast (SINGULAIR) 10 MG tablet Take 10 mg by mouth at bedtime.   Yes [provider]  olopatadine (PATANOL) 0.1 % ophthalmic solution Place 1 drop into both eyes 2 (two) times daily. 12/25/18  Yes Corum, Rex Kras, MD  potassium chloride (KLOR-CON) 10 MEQ tablet Take 1 tablet (10 mEq total) by mouth daily. 06/14/20  Yes Wendie Agreste, MD  pregabalin (LYRICA) 100 MG capsule Take 1 capsule (100 mg total) by mouth 3 (three) times daily. 03/05/20  Yes Jacelyn Pi, Irma M, MD  sitaGLIPtin (JANUVIA) 100 MG tablet Take 1 tablet (100 mg total) by mouth daily. 04/21/20  Yes Shamleffer, Melanie Crazier, MD  SYMBICORT 160-4.5 MCG/ACT inhaler INHALE 2 PUFFS INTO THE LUNGS DAILY. 05/09/18  Yes Valentina Shaggy, MD  triamcinolone (KENALOG) 0.1 %  Apply 1 application topically 2 (two) times daily. To affected areas on face, scalp as needed. 06/14/20  Yes Wendie Agreste, MD  VITAMIN E PO Take by mouth.   Yes [provider]  albuterol (PROVENTIL) (2.5 MG/3ML) 0.083% nebulizer solution Take 3 mLs (2.5 mg total) by nebulization every 6 (six) hours as needed for wheezing or shortness of breath. 11/13/19   Wendie Agreste, MD  EPIPEN 2-PAK 0.3 MG/0.3ML SOAJ injection Inject 0.3 mg into the muscle as needed (for allergic reaction).  05/18/16   [provider]  medroxyPROGESTERone (DEPO-PROVERA) 150 MG/ML injection  08/15/18   [provider]  rizatriptan (MAXALT) 10 MG tablet Take 1 tablet (10 mg total) by mouth 3 (three) times daily as needed for migraine. 05/20/20   Suzzanne Cloud, NP    ROS: All other systems have been reviewed and were otherwise negative with the exception of those mentioned in the HPI and as above.  Physical Exam: General: Alert, no acute distress Cardiovascular: No pedal edema Respiratory: No cyanosis, no use of accessory musculature GI: No organomegaly, abdomen is soft and non-tender Skin: No lesions  in the area of chief complaint Neurologic: Sensation intact distally Psychiatric: Patient is competent for consent with normal mood and affect Lymphatic: No axillary or cervical lymphadenopathy  MUSCULOSKELETAL:  Right shoulder: Positive AC tenderness to palpation, impingement and O'Brien's.  4/5 supraspinatus strength.  Active forward elevation to 90; passive to 160; external rotation to 70 and symmetric.  Imaging: MRI demonstrates full thickness supraspinatus tear; some excrescence on the tuberosity; type 2 acromion; fluid around the biceps consistent with biceps tendinosis.  No obvious SLAP tear.  Assessment: Right shoulder full thickness cuff tear.  Plan: Plan for Procedure(s): CARPAL TUNNEL RELEASE SHOULDER ARTHROSCOPY WITH SUBACROMIAL DECOMPRESSION AND DISTAL CLAVICLE EXCISION, ROTATOR CUFF REPAIR AND BICEPS TENODESIS  The risks benefits and alternatives were discussed with the patient including but not limited to the risks of nonoperative treatment, versus surgical intervention including infection, bleeding, nerve injury,  blood clots, cardiopulmonary complications, morbidity, mortality, among others, and they were willing to proceed.   The patient acknowledged the explanation, agreed to proceed with the plan and consent was signed.   Operative Plan: Right shoulder arthroscopy, rotator cuff repair, subacromial decompression, and distal clavicle excision with possible biceps tenodesis  Dr. Fredna Dow to perform carpal tunnel release same day Discharge Medications: Standard (Meloxicam 62m) DVT Prophylaxis: None Physical Therapy: Outpatient PT Special Discharge needs: SAdams PA-C  07/18/2020 3:24 PM

## 2020-07-19 ENCOUNTER — Encounter (HOSPITAL_BASED_OUTPATIENT_CLINIC_OR_DEPARTMENT_OTHER)
Admission: RE | Admit: 2020-07-19 | Discharge: 2020-07-19 | Disposition: A | Discharge status: Home / Self Care | Payer: Managed Care, Other (non HMO) | Source: Ambulatory Visit | Attending: Orthopedic Surgery | Admitting: Orthopedic Surgery

## 2020-07-19 ENCOUNTER — Other Ambulatory Visit (HOSPITAL_COMMUNITY)
Admission: RE | Admit: 2020-07-19 | Discharge: 2020-07-19 | Disposition: A | Discharge status: Home / Self Care | Payer: Managed Care, Other (non HMO) | Source: Ambulatory Visit | Attending: Orthopedic Surgery | Admitting: Orthopedic Surgery

## 2020-07-19 DIAGNOSIS — Z20822 Contact with and (suspected) exposure to covid-19: Secondary | ICD-10-CM | POA: Insufficient documentation

## 2020-07-19 DIAGNOSIS — Z01812 Encounter for preprocedural laboratory examination: Secondary | ICD-10-CM | POA: Insufficient documentation

## 2020-07-19 LAB — SARS CORONAVIRUS 2 (TAT 6-24 HRS): SARS Coronavirus 2: NEGATIVE

## 2020-07-19 LAB — BASIC METABOLIC PANEL
Anion gap: 9 (ref 5–15)
BUN: 6 mg/dL (ref 6–20)
CO2: 26 mmol/L (ref 22–32)
Calcium: 9 mg/dL (ref 8.9–10.3)
Chloride: 105 mmol/L (ref 98–111)
Creatinine, Ser: 0.85 mg/dL (ref 0.44–1.00)
GFR, Estimated: >60 mL/min (ref >60)
Glucose, Bld: 117 mg/dL — ABNORMAL HIGH (ref 70–99)
Potassium: 4 mmol/L (ref 3.5–5.1)
Sodium: 140 mmol/L (ref 135–145)

## 2020-07-19 NOTE — Progress Notes (Signed)

## 2020-07-22 ENCOUNTER — Encounter (HOSPITAL_BASED_OUTPATIENT_CLINIC_OR_DEPARTMENT_OTHER)
Admission: RE | Disposition: A | Discharge status: Home / Self Care | Payer: Self-pay | Source: Home / Self Care | Attending: Orthopedic Surgery

## 2020-07-22 ENCOUNTER — Other Ambulatory Visit: Payer: Self-pay

## 2020-07-22 ENCOUNTER — Encounter (HOSPITAL_BASED_OUTPATIENT_CLINIC_OR_DEPARTMENT_OTHER): Payer: Self-pay | Admitting: Orthopedic Surgery

## 2020-07-22 ENCOUNTER — Ambulatory Visit (HOSPITAL_BASED_OUTPATIENT_CLINIC_OR_DEPARTMENT_OTHER): Payer: Managed Care, Other (non HMO) | Admitting: Anesthesiology

## 2020-07-22 ENCOUNTER — Ambulatory Visit (HOSPITAL_BASED_OUTPATIENT_CLINIC_OR_DEPARTMENT_OTHER)
Admission: RE | Admit: 2020-07-22 | Discharge: 2020-07-22 | Disposition: A | Discharge status: Home / Self Care | Payer: Managed Care, Other (non HMO) | Attending: Orthopedic Surgery | Admitting: Orthopedic Surgery

## 2020-07-22 DIAGNOSIS — Z793 Long term (current) use of hormonal contraceptives: Secondary | ICD-10-CM | POA: Insufficient documentation

## 2020-07-22 DIAGNOSIS — Z794 Long term (current) use of insulin: Secondary | ICD-10-CM | POA: Insufficient documentation

## 2020-07-22 DIAGNOSIS — Z888 Allergy status to other drugs, medicaments and biological substances status: Secondary | ICD-10-CM | POA: Diagnosis not present

## 2020-07-22 DIAGNOSIS — Z7951 Long term (current) use of inhaled steroids: Secondary | ICD-10-CM | POA: Insufficient documentation

## 2020-07-22 DIAGNOSIS — Z79899 Other long term (current) drug therapy: Secondary | ICD-10-CM | POA: Insufficient documentation

## 2020-07-22 DIAGNOSIS — G5601 Carpal tunnel syndrome, right upper limb: Secondary | ICD-10-CM | POA: Diagnosis present

## 2020-07-22 DIAGNOSIS — M75121 Complete rotator cuff tear or rupture of right shoulder, not specified as traumatic: Secondary | ICD-10-CM | POA: Insufficient documentation

## 2020-07-22 HISTORY — PX: SHOULDER ARTHROSCOPY WITH SUBACROMIAL DECOMPRESSION, ROTATOR CUFF REPAIR AND BICEP TENDON REPAIR: SHX5687

## 2020-07-22 HISTORY — PX: CARPAL TUNNEL RELEASE: SHX101

## 2020-07-22 LAB — GLUCOSE, CAPILLARY
Glucose-Capillary: 106 mg/dL — ABNORMAL HIGH (ref 70–99)
Glucose-Capillary: 75 mg/dL (ref 70–99)

## 2020-07-22 SURGERY — CARPAL TUNNEL RELEASE
Anesthesia: General | Site: Shoulder | Laterality: Right

## 2020-07-22 MED ORDER — LACTATED RINGERS IV SOLN: INTRAVENOUS | Status: DC

## 2020-07-22 MED ORDER — ONDANSETRON HCL 4 MG/2ML IJ SOLN
INTRAMUSCULAR | Status: AC
  Filled 2020-07-22: qty 2

## 2020-07-22 MED ORDER — LIDOCAINE 2% (20 MG/ML) 5 ML SYRINGE
INTRAMUSCULAR | Status: AC
  Filled 2020-07-22: qty 5

## 2020-07-22 MED ORDER — ONDANSETRON HCL 4 MG/2ML IJ SOLN
INTRAMUSCULAR | Status: DC | PRN
  Administered 2020-07-22: 4 mg via INTRAVENOUS

## 2020-07-22 MED ORDER — BUPIVACAINE LIPOSOME 1.3 % IJ SUSP
INTRAMUSCULAR | Status: DC | PRN
  Administered 2020-07-22: 10 mL via PERINEURAL

## 2020-07-22 MED ORDER — CEFAZOLIN SODIUM-DEXTROSE 2-4 GM/100ML-% IV SOLN: 2.0000 g | INTRAVENOUS | Status: DC

## 2020-07-22 MED ORDER — DROPERIDOL 2.5 MG/ML IJ SOLN
INTRAMUSCULAR | Status: DC | PRN
  Administered 2020-07-22: .625 mg via INTRAVENOUS

## 2020-07-22 MED ORDER — OXYCODONE HCL 5 MG PO TABS: 5.0000 mg | ORAL_TABLET | Freq: Once | ORAL | Status: DC | PRN

## 2020-07-22 MED ORDER — MIDAZOLAM HCL 2 MG/2ML IJ SOLN
2.0000 mg | Freq: Once | INTRAMUSCULAR | Status: AC
  Administered 2020-07-22: 2 mg via INTRAVENOUS

## 2020-07-22 MED ORDER — SODIUM CHLORIDE 0.9 % IR SOLN
Status: DC | PRN
  Administered 2020-07-22: 6000 mL

## 2020-07-22 MED ORDER — OXYCODONE HCL 5 MG PO TABS
ORAL_TABLET | ORAL | 0 refills | Status: DC
Start: 2020-07-22 — End: 2021-01-18

## 2020-07-22 MED ORDER — DROPERIDOL 2.5 MG/ML IJ SOLN
INTRAMUSCULAR | Status: AC
  Filled 2020-07-22: qty 2

## 2020-07-22 MED ORDER — DEXAMETHASONE SODIUM PHOSPHATE 10 MG/ML IJ SOLN
INTRAMUSCULAR | Status: AC
  Filled 2020-07-22: qty 1

## 2020-07-22 MED ORDER — PHENYLEPHRINE HCL (PRESSORS) 10 MG/ML IV SOLN
INTRAVENOUS | Status: DC | PRN
  Administered 2020-07-22: 40 ug via INTRAVENOUS
  Administered 2020-07-22: 80 ug via INTRAVENOUS

## 2020-07-22 MED ORDER — MEPERIDINE HCL 25 MG/ML IJ SOLN: 6.2500 mg | INTRAMUSCULAR | Status: DC | PRN

## 2020-07-22 MED ORDER — MIDAZOLAM HCL 2 MG/2ML IJ SOLN
INTRAMUSCULAR | Status: AC
  Filled 2020-07-22: qty 2

## 2020-07-22 MED ORDER — BUPIVACAINE HCL (PF) 0.25 % IJ SOLN
INTRAMUSCULAR | Status: AC
  Filled 2020-07-22: qty 120

## 2020-07-22 MED ORDER — CEFAZOLIN SODIUM-DEXTROSE 2-4 GM/100ML-% IV SOLN
INTRAVENOUS | Status: AC
  Filled 2020-07-22: qty 100

## 2020-07-22 MED ORDER — FENTANYL CITRATE (PF) 100 MCG/2ML IJ SOLN
INTRAMUSCULAR | Status: AC
  Filled 2020-07-22: qty 2

## 2020-07-22 MED ORDER — MELOXICAM 15 MG PO TABS: 15.0000 mg | ORAL_TABLET | Freq: Every day | ORAL | 0 refills | Status: AC

## 2020-07-22 MED ORDER — HYDROMORPHONE HCL 1 MG/ML IJ SOLN: 0.2500 mg | INTRAMUSCULAR | Status: DC | PRN

## 2020-07-22 MED ORDER — OXYCODONE HCL 5 MG/5ML PO SOLN: 5.0000 mg | Freq: Once | ORAL | Status: DC | PRN

## 2020-07-22 MED ORDER — ONDANSETRON HCL 4 MG PO TABS: 4.0000 mg | ORAL_TABLET | Freq: Three times a day (TID) | ORAL | 0 refills | Status: DC | PRN

## 2020-07-22 MED ORDER — ACETAMINOPHEN 500 MG PO TABS: 1000.0000 mg | ORAL_TABLET | Freq: Three times a day (TID) | ORAL | 0 refills | Status: DC

## 2020-07-22 MED ORDER — FENTANYL CITRATE (PF) 100 MCG/2ML IJ SOLN
100.0000 ug | Freq: Once | INTRAMUSCULAR | Status: AC
  Administered 2020-07-22: 100 ug via INTRAVENOUS

## 2020-07-22 MED ORDER — PROPOFOL 500 MG/50ML IV EMUL
INTRAVENOUS | Status: AC
  Filled 2020-07-22: qty 50

## 2020-07-22 MED ORDER — CEFAZOLIN SODIUM-DEXTROSE 2-4 GM/100ML-% IV SOLN
2.0000 g | INTRAVENOUS | Status: AC
  Administered 2020-07-22: 2 g via INTRAVENOUS

## 2020-07-22 MED ORDER — LIDOCAINE 2% (20 MG/ML) 5 ML SYRINGE
INTRAMUSCULAR | Status: DC | PRN
  Administered 2020-07-22: 40 mg via INTRAVENOUS

## 2020-07-22 MED ORDER — DEXAMETHASONE SODIUM PHOSPHATE 4 MG/ML IJ SOLN
INTRAMUSCULAR | Status: DC | PRN
  Administered 2020-07-22: 4 mg via INTRAVENOUS

## 2020-07-22 MED ORDER — PHENYLEPHRINE 40 MCG/ML (10ML) SYRINGE FOR IV PUSH (FOR BLOOD PRESSURE SUPPORT)
PREFILLED_SYRINGE | INTRAVENOUS | Status: AC
  Filled 2020-07-22: qty 10

## 2020-07-22 MED ORDER — PROPOFOL 10 MG/ML IV BOLUS
INTRAVENOUS | Status: DC | PRN
  Administered 2020-07-22: 140 mg via INTRAVENOUS

## 2020-07-22 MED ORDER — EPINEPHRINE PF 1 MG/ML IJ SOLN
INTRAMUSCULAR | Status: AC
  Filled 2020-07-22: qty 4

## 2020-07-22 MED ORDER — PROMETHAZINE HCL 25 MG/ML IJ SOLN: 6.2500 mg | INTRAMUSCULAR | Status: DC | PRN

## 2020-07-22 MED ORDER — BUPIVACAINE HCL (PF) 0.5 % IJ SOLN
INTRAMUSCULAR | Status: DC | PRN
  Administered 2020-07-22: 20 mL via PERINEURAL

## 2020-07-22 SURGICAL SUPPLY — 68 items
AID PSTN UNV HD RSTRNT DISP (MISCELLANEOUS) ×2
ANCHOR SUT 1.8 FBRTK KNTLS 2SU (Anchor) ×2 IMPLANT
APL PRP STRL LF DISP 70% ISPRP (MISCELLANEOUS) ×4
BLADE EXCALIBUR 4.0X13 (MISCELLANEOUS) ×3 IMPLANT
BLADE SURG 15 STRL LF DISP TIS (BLADE) ×4 IMPLANT
BLADE SURG 15 STRL SS (BLADE) ×6
BNDG CMPR 9X4 STRL LF SNTH (GAUZE/BANDAGES/DRESSINGS) ×2
BNDG ELASTIC 3X5.8 VLCR STR LF (GAUZE/BANDAGES/DRESSINGS) ×3 IMPLANT
BNDG ESMARK 4X9 LF (GAUZE/BANDAGES/DRESSINGS) ×3 IMPLANT
BNDG GAUZE ELAST 4 BULKY (GAUZE/BANDAGES/DRESSINGS) ×3 IMPLANT
BURR OVAL 8 FLU 4.0X13 (MISCELLANEOUS) ×1 IMPLANT
CANNULA PASSPORT BUTTON 10-40 (CANNULA) ×1 IMPLANT
CHLORAPREP W/TINT 26 (MISCELLANEOUS) ×6 IMPLANT
CLSR STERI-STRIP ANTIMIC 1/2X4 (GAUZE/BANDAGES/DRESSINGS) ×3 IMPLANT
COOLER ICEMAN CLASSIC (MISCELLANEOUS) ×3 IMPLANT
CORD BIPOLAR FORCEPS 12FT (ELECTRODE) ×3 IMPLANT
COVER BACK TABLE 60X90IN (DRAPES) ×3 IMPLANT
COVER MAYO STAND STRL (DRAPES) ×2 IMPLANT
CUFF TOURN SGL QUICK 18X4 (TOURNIQUET CUFF) ×3 IMPLANT
DRAPE EXTREMITY T 121X128X90 (DISPOSABLE) ×1 IMPLANT
DRAPE IMP U-DRAPE 54X76 (DRAPES) ×3 IMPLANT
DRAPE INCISE IOBAN 66X45 STRL (DRAPES) ×1 IMPLANT
DRAPE SHOULDER BEACH CHAIR (DRAPES) ×3 IMPLANT
DRAPE SURG 17X23 STRL (DRAPES) ×3 IMPLANT
DRSG PAD ABDOMINAL 8X10 ST (GAUZE/BANDAGES/DRESSINGS) ×8 IMPLANT
DW OUTFLOW CASSETTE/TUBE SET (MISCELLANEOUS) ×3 IMPLANT
GAUZE SPONGE 4X4 12PLY STRL (GAUZE/BANDAGES/DRESSINGS) ×6 IMPLANT
GAUZE XEROFORM 1X8 LF (GAUZE/BANDAGES/DRESSINGS) ×3 IMPLANT
GLOVE BIO SURGEON STRL SZ 6.5 (GLOVE) ×3 IMPLANT
GLOVE BIO SURGEON STRL SZ7 (GLOVE) ×3 IMPLANT
GLOVE BIO SURGEON STRL SZ7.5 (GLOVE) ×4 IMPLANT
GLOVE BIOGEL PI IND STRL 7.5 (GLOVE) IMPLANT
GLOVE BIOGEL PI INDICATOR 7.5 (GLOVE) ×1
GLOVE ECLIPSE 8.0 STRL XLNG CF (GLOVE) ×6 IMPLANT
GLOVE SRG 8 PF TXTR STRL LF DI (GLOVE) ×4 IMPLANT
GLOVE SURG UNDER POLY LF SZ6.5 (GLOVE) ×3 IMPLANT
GLOVE SURG UNDER POLY LF SZ8 (GLOVE) ×6
GOWN STRL REUS W/ TWL LRG LVL3 (GOWN DISPOSABLE) ×6 IMPLANT
GOWN STRL REUS W/TWL LRG LVL3 (GOWN DISPOSABLE) ×9
GOWN STRL REUS W/TWL XL LVL3 (GOWN DISPOSABLE) ×8 IMPLANT
IMPL SPEEDBRIDGE KIT (Orthopedic Implant) IMPLANT
IMPLANT SPEEDBRIDGE KIT (Orthopedic Implant) ×3 IMPLANT
KIT STABILIZATION SHOULDER (MISCELLANEOUS) ×3 IMPLANT
KIT STR SPEAR 1.8 FBRTK DISP (KITS) ×1 IMPLANT
MANIFOLD NEPTUNE II (INSTRUMENTS) ×3 IMPLANT
NDL SAFETY ECLIPSE 18X1.5 (NEEDLE) ×2 IMPLANT
NEEDLE HYPO 18GX1.5 SHARP (NEEDLE) ×3
NS IRRIG 1000ML POUR BTL (IV SOLUTION) ×3 IMPLANT
PACK ARTHROSCOPY DSU (CUSTOM PROCEDURE TRAY) ×3 IMPLANT
PACK BASIN DAY SURGERY FS (CUSTOM PROCEDURE TRAY) ×5 IMPLANT
PAD COLD SHLDR WRAP-ON (PAD) ×3 IMPLANT
PAD ORTHO SHOULDER 7X19 LRG (SOFTGOODS) ×1 IMPLANT
PADDING CAST ABS 4INX4YD NS (CAST SUPPLIES) ×1
PADDING CAST ABS COTTON 4X4 ST (CAST SUPPLIES) ×2 IMPLANT
PORT APPOLLO RF 90DEGREE MULTI (SURGICAL WAND) ×3 IMPLANT
RESTRAINT HEAD UNIVERSAL NS (MISCELLANEOUS) ×3 IMPLANT
SHEET MEDIUM DRAPE 40X70 STRL (DRAPES) ×1 IMPLANT
SLEEVE SCD COMPRESS KNEE MED (MISCELLANEOUS) ×3 IMPLANT
STOCKINETTE 4X48 STRL (DRAPES) ×1 IMPLANT
SUT ETHILON 4 0 PS 2 18 (SUTURE) ×3 IMPLANT
SUT MNCRL AB 4-0 PS2 18 (SUTURE) ×3 IMPLANT
SUT PDS AB 1 CT  36 (SUTURE) ×3
SUT PDS AB 1 CT 36 (SUTURE) IMPLANT
SYR 5ML LL (SYRINGE) ×3 IMPLANT
TOWEL GREEN STERILE FF (TOWEL DISPOSABLE) ×11 IMPLANT
TUBE CONNECTING 20X1/4 (TUBING) ×3 IMPLANT
TUBING ARTHROSCOPY IRRIG 16FT (MISCELLANEOUS) ×3 IMPLANT
UNDERPAD 30X36 HEAVY ABSORB (UNDERPADS AND DIAPERS) ×3 IMPLANT

## 2020-07-22 NOTE — Anesthesia Preprocedure Evaluation (Signed)
Anesthesia Evaluation  Patient identified by MRN, date of birth, ID band Patient awake    Reviewed: Allergy & Precautions, NPO status , Patient's Chart, lab work & pertinent test results  History of Anesthesia Complications (+) PONV  Airway Mallampati: II  TM Distance: >3 FB Neck ROM: Full    Dental  (+) Teeth Intact, Dental Advisory Given   Pulmonary asthma ,    breath sounds clear to auscultation       Cardiovascular hypertension, Pt. on medications  Rhythm:Regular Rate:Normal     Neuro/Psych  Headaches,    GI/Hepatic GERD  ,  Endo/Other  diabetes, Type 2  Renal/GU      Musculoskeletal   Abdominal   Peds  Hematology   Anesthesia Other Findings   Reproductive/Obstetrics                             Anesthesia Physical  Anesthesia Plan  ASA: III  Anesthesia Plan: General   Post-op Pain Management:  Regional for Post-op pain   Induction: Intravenous  PONV Risk Score and Plan: Ondansetron, Dexamethasone, Midazolam, Droperidol and Treatment may vary due to age or medical condition  Airway Management Planned: LMA  Additional Equipment:   Intra-op Plan:   Post-operative Plan: Extubation in OR  Informed Consent: I have reviewed the patients History and Physical, chart, labs and discussed the procedure including the risks, benefits and alternatives for the proposed anesthesia with the patient or authorized representative who has indicated his/her understanding and acceptance.     Dental advisory given  Plan Discussed with: CRNA and Anesthesiologist  Anesthesia Plan Comments:         Anesthesia Quick Evaluation

## 2020-07-22 NOTE — Discharge Instructions (Addendum)
Information for Discharge Teaching: EXPAREL (bupivacaine liposome injectable suspension)   Your surgeon or anesthesiologist gave you EXPAREL(bupivacaine) to help control your pain after surgery.   EXPAREL is a local anesthetic that provides pain relief by numbing the tissue around the surgical site.  EXPAREL is designed to release pain medication over time and can control pain for up to 72 hours.  Depending on how you respond to EXPAREL, you may require less pain medication during your recovery.  Possible side effects:  Temporary loss of sensation or ability to move in the area where bupivacaine was injected.  Nausea, vomiting, constipation  Rarely, numbness and tingling in your mouth or lips, lightheadedness, or anxiety may occur.  Call your doctor right away if you think you may be experiencing any of these sensations, or if you have other questions regarding possible side effects.  Follow all other discharge instructions given to you by your surgeon or nurse. Eat a healthy diet and drink plenty of water or other fluids.  If you return to the hospital for any reason within 96 hours following the administration of EXPAREL, it is important for health care providers to know that you have received this anesthetic. A teal colored band has been placed on your arm with the date, time and amount of EXPAREL you have received in order to alert and inform your health care providers. Please leave this armband in place for the full 96 hours following administration, and then you may remove the band.    Regional Anesthesia Blocks  1. Numbness or the inability to move the "blocked" extremity may last from 3-48 hours after placement. The length of time depends on the medication injected and your individual response to the medication. If the numbness is not going away after 48 hours, call your surgeon.  2. The extremity that is blocked will need to be protected until the numbness is gone and the   Strength has returned. Because you cannot feel it, you will need to take extra care to avoid injury. Because it may be weak, you may have difficulty moving it or using it. You may not know what position it is in without looking at it while the block is in effect.  3. For blocks in the legs and feet, returning to weight bearing and walking needs to be done carefully. You will need to wait until the numbness is entirely gone and the strength has returned. You should be able to move your leg and foot normally before you try and bear weight or walk. You will need someone to be with you when you first try to ensure you do not fall and possibly risk injury.  4. Bruising and tenderness at the needle site are common side effects and will resolve in a few days.  5. Persistent numbness or new problems with movement should be communicated to the surgeon or the [facility] [phone] [facility] [phone]).     Post Anesthesia Home Care Instructions  Activity: Get plenty of rest for the remainder of the day. A responsible individual must stay with you for 24 hours following the procedure.  For the next 24 hours, DO NOT: -Drive a car -Advertising copywriter -Drink alcoholic beverages -Take any medication unless instructed by your physician -Make any legal decisions or sign important papers.  Meals: Start with liquid foods such as gelatin or soup. Progress to regular foods as tolerated. Avoid greasy, spicy, heavy foods. If nausea and/or vomiting occur, drink only  clear liquids until the nausea and/or vomiting subsides. Call your physician if vomiting continues.  Special Instructions/Symptoms: Your throat may feel dry or sore from the anesthesia or the breathing tube placed in your throat during surgery. If this causes discomfort, gargle with warm salt water. The discomfort should disappear within 24 hours.  If you had a scopolamine patch placed behind your ear for the  management of post- operative nausea and/or vomiting:  1. The medication in the patch is effective for 72 hours, after which it should be removed.  Wrap patch in a tissue and discard in the trash. Wash hands thoroughly with soap and water. 2. You may remove the patch earlier than 72 hours if you experience unpleasant side effects which may include dry mouth, dizziness or visual disturbances. 3. Avoid touching the patch. Wash your hands with soap and water after contact with the patch.        [facility] Center Instructions Hand Surgery  Wound Care: Keep your hand elevated above the level of your heart.  Do not allow it to dangle by your side.  Keep the dressing dry and do not remove it unless your doctor advises you to do so.  He will usually change it at the time of your post-op visit.  Moving your fingers is advised to stimulate circulation but will depend on the site of your surgery.  If you have a splint applied, your doctor will advise you regarding movement.  Activity: Do not drive or operate machinery today.  Rest today and then you may return to your normal activity and work as indicated by your physician.  Diet:  Drink liquids today or eat a light diet.  You may resume a regular diet tomorrow.    General expectations: Pain for two to three days. Fingers may become slightly swollen.  Call your doctor if any of the following occur: Severe pain not relieved by pain medication. Elevated temperature. Dressing soaked with blood. Inability to move fingers. White or bluish color to fingers.

## 2020-07-22 NOTE — H&P (Signed)
Sandra Moody is an 49 y.o. female.   Chief Complaint: right carpal tunnel syndrome HPI: 49 yo female with numbness and tingling right hand.  Positive nerve conduction studies.  She wishes to have right carpal tunnel release.  Allergies:  Allergies  Allergen Reactions  . Ginger Itching and Swelling  . Passion Fruit Flavor [Flavoring Agent] Itching and Swelling  . Shellfish Allergy Anaphylaxis  . Other Other (See Comments)    ALLERGIC TO ALL TREE NUTS ALLERGIC TO ALL TREE NUTS  . Tamiflu [Oseltamivir Phosphate]     Past Medical History:  Diagnosis Date  . Acute renal disease   . Asthma   . Bilateral carpal tunnel syndrome 03/11/2019  . Common migraine with intractable migraine 08/29/2018  . Daily headache    (03/08/2017)  . Essential hypertension 02/20/2019  . GERD (gastroesophageal reflux disease)   . H pylori ulcer ~ 08/2016  . History of hiatal hernia   . Leaky heart valve ~ 09/2016  . Migraine    "maybe weekly" (03/08/2017)  . PONV (postoperative nausea and vomiting)   . Type 2 diabetes mellitus with hyperglycemia, without long-term current use of insulin ([place]) 02/20/2019    Past Surgical History:  Procedure Laterality Date  . CARPAL TUNNEL RELEASE Left 12/18/2019   Procedure: CARPAL TUNNEL RELEASE;  Surgeon: [name], MD;  Location: [facility];  Service: Orthopedics;  Laterality: Left;  . CHOLECYSTECTOMY N/A 03/08/2017   Procedure: LAPAROSCOPIC CHOLECYSTECTOMY;  Surgeon: Coralie Keens, MD;  Location: [facility];  Service: General;  Laterality: N/A;  . DILATION AND CURETTAGE OF UTERUS    . LAPAROSCOPIC CHOLECYSTECTOMY  03/08/2017  . TRIGGER FINGER RELEASE Left 12/18/2019   Procedure: RELEASE TRIGGER FINGER/A-1 PULLEY;  Surgeon: [name], MD;  Location: [facility];  Service: Orthopedics;  Laterality: Left;  . TUBAL LIGATION      Family History: Family History  Problem Relation Age of Onset  . Thyroid disease Mother   . Diabetes Maternal  Grandmother   . Heart disease Maternal Grandfather     Social History:   reports that she has never smoked. She has never used smokeless tobacco. She reports previous alcohol use. She reports that she does not use drugs.  Medications: Medications Prior to Admission  Medication Sig Dispense Refill  . ACCU-CHEK GUIDE test strip USE TO CHECK GLUCOSE TWICE DAILY,FASTING AND 2 HOURS AFTER DINNER 100 strip 5  . amLODipine (NORVASC) 2.5 MG tablet TAKE 1 TABLET BY MOUTH EVERY DAY 90 tablet 1  . BIOTIN PO Take by mouth.    . blood glucose meter kit and supplies Per insurance preference. Use to check glucose twice a day (fasting and 2 hours after dinner)  Dx E11.65 1 each 5  . Cholecalciferol (VITAMIN D3 PO) Take by mouth.    . clobetasol ointment (TEMOVATE) 0.05 % APPLY TOPICALLY TWICE A DAY AS NEEDED FOR ITCHY RASH AS NEEDED 30 g 0  . fluticasone (FLONASE) 50 MCG/ACT nasal spray USE 1 2 SPRAYS IN EACH NOSTRIL ONCE A DAY    . Galcanezumab-gnlm (EMGALITY) 120 MG/ML SOAJ Inject 120 mg into the skin every 30 (thirty) days. 1 pen 11  . insulin glargine (LANTUS SOLOSTAR) 100 UNIT/ML Solostar Pen Inject 40 Units into the skin daily. 15 mL 6  . Insulin Pen Needle (PEN NEEDLES) 32G X 6 MM MISC Use once a day with lantus injection 100 each 2  . ketoconazole (NIZORAL) 2 % cream Apply 1 application topically daily. To seborrhea/face lesion  as needed. 15 g 0  . ketoconazole (NIZORAL) 2 % shampoo Apply 1 application topically 2 (two) times a week. Apply to affected area lather, leave in place for 5 mins and then rinse off with water 120 mL 5  . levocetirizine (XYZAL) 5 MG tablet 1 2 TABLET IN THE EVENING ONCE A DAY ORALLY 15    . methocarbamol (ROBAXIN) 500 MG tablet Take 1 tablet (500 mg total) by mouth every 8 (eight) hours as needed for muscle spasms. 60 tablet 0  . montelukast (SINGULAIR) 10 MG tablet Take 10 mg by mouth at bedtime.    . olopatadine (PATANOL) 0.1 % ophthalmic solution Place 1 drop into both  eyes 2 (two) times daily. 5 mL 12  . potassium chloride (KLOR-CON) 10 MEQ tablet Take 1 tablet (10 mEq total) by mouth daily. 30 tablet 2  . pregabalin (LYRICA) 100 MG capsule Take 1 capsule (100 mg total) by mouth 3 (three) times daily. 90 capsule 3  . rizatriptan (MAXALT) 10 MG tablet Take 1 tablet (10 mg total) by mouth 3 (three) times daily as needed for migraine. 6 tablet 6  . sitaGLIPtin (JANUVIA) 100 MG tablet Take 1 tablet (100 mg total) by mouth daily. 90 tablet 3  . SYMBICORT 160-4.5 MCG/ACT inhaler INHALE 2 PUFFS INTO THE LUNGS DAILY. 10.2 Inhaler 0  . triamcinolone (KENALOG) 0.1 % Apply 1 application topically 2 (two) times daily. To affected areas on face, scalp as needed. 30 g 0  . VITAMIN E PO Take by mouth.    . albuterol (PROVENTIL) (2.5 MG/3ML) 0.083% nebulizer solution Take 3 mLs (2.5 mg total) by nebulization every 6 (six) hours as needed for wheezing or shortness of breath. 75 mL 1  . ALBUTEROL SULFATE PO Take by mouth.    . EPIPEN 2-PAK 0.3 MG/0.3ML SOAJ injection Inject 0.3 mg into the muscle as needed (for allergic reaction).   2  . medroxyPROGESTERone (DEPO-PROVERA) 150 MG/ML injection       Results for orders placed or performed during the hospital encounter of 07/22/20 (from the past 48 hour(s))  Glucose, capillary     Status: Abnormal   Collection Time: 07/22/20  6:51 AM  Result Value Ref Range   Glucose-Capillary 106 (H) 70 - 99 mg/dL    Comment: Glucose reference range applies only to samples taken after fasting for at least 8 hours.    No results found.   A comprehensive review of systems was negative.  Blood pressure 123/87, pulse 80, temperature 98.6 F (37 C), temperature source Oral, resp. rate 12, height 5' 4" (1.626 m), weight 75.7 kg, SpO2 100 %.  General appearance: alert, cooperative and appears stated age Head: Normocephalic, without obvious abnormality, atraumatic Neck: supple, symmetrical, trachea midline Cardio: regular rate and  rhythm Resp: clear to auscultation bilaterally Extremities: Intact sensation and capillary refill all digits.  +epl/fpl/io.  No wounds.  Pulses: 2+ and symmetric Skin: Skin color, texture, turgor normal. No rashes or lesions Neurologic: Grossly normal Incision/Wound: none  Assessment/Plan Right carpal tunnel syndrome.  Non operative and operative treatment options have been discussed with the patient and patient wishes to proceed with operative treatment. Risks, benefits, and alternatives of surgery have been discussed and the patient agrees with the plan of care.   Kevin Kuzma 07/22/2020, 7:40 AM   

## 2020-07-22 NOTE — Progress Notes (Signed)
Assisted Dr. Miller with right, ultrasound guided, interscalene  block. Side rails up, monitors on throughout procedure. See vital signs in flow sheet. Tolerated Procedure well. 

## 2020-07-22 NOTE — Op Note (Signed)
07/22/2020 [facility]                              OPERATIVE REPORT   PREOPERATIVE DIAGNOSIS:   Right carpal tunnel syndrome.  POSTOPERATIVE DIAGNOSIS:   Right carpal tunnel syndrome.  PROCEDURE:   Right carpal tunnel release.  SURGEON:  Betha Loa, MD  ASSISTANT:  none.  ANESTHESIA: General with regional  IV FLUIDS:  Per anesthesia flow sheet.  ESTIMATED BLOOD LOSS:  Minimal.  COMPLICATIONS:  None.  SPECIMENS:  None.  TOURNIQUET TIME:    Total Tourniquet Time Documented: Forearm (Right) - 14 minutes Total: Forearm (Right) - 14 minutes   DISPOSITION:  Stable to PACU.  LOCATION: [facility]  INDICATIONS:  49 yo female with numbness and tingling right hand.  Positive nerve conduction studies.  She wishes to have a carpal tunnel release for management of her symptoms.  Risks, benefits and alternatives of surgery were discussed including the risk of blood loss; infection; damage to nerves, vessels, tendons, ligaments, bone; failure of surgery; need for additional surgery; complications with wound healing; continued pain; recurrence of carpal tunnel syndrome; and damage to motor branch. She voiced understanding of these risks and elected to proceed.   OPERATIVE COURSE:  The patient was identified preoperatively.  The surgical site was marked.  The risks, benefits, and alternatives of the surgery were reviewed and she wished to proceed.  Surgical consent had been signed.  She was given preoperative antibiotic prophylaxis.  She underwent right shoulder arthroscopy prior to her carpal tunnel release.  This procedure is dictated under separate note by Dr. Everardo Pacific.  After the shoulder arthroscopy had been completed, the right upper extremity was prepped and draped in normal sterile orthopaedic fashion.  A surgical pause was performed between the surgeons, anesthesia, and operating room staff, and all were in agreement as to the patient, procedure, and site  of procedure.  Tourniquet at the proximal aspect of the forearm was inflated to 250 mmHg after exsanguination of the arm with an Esmarch bandage  Incision was made over the transverse carpal ligament and carried into the subcutaneous tissues by spreading technique.  Bipolar electrocautery was used to obtain hemostasis.  The palmar fascia was sharply incised.  The transverse carpal ligament was identified and sharply incised.  It was incised distally first.  The flexor tendons were identified.  The flexor tendon to the ring finger was identified and retracted radially.  The transverse carpal ligament was then incised proximally.  Scissors were used to split the distal aspect of the volar antebrachial fascia.  A finger was placed into the wound to ensure complete decompression, which was the case.  The nerve was examined.  It was flattened and hyperemic.  The motor branch was identified and was intact.  The wound was copiously irrigated with sterile saline.  It was then closed with 4-0 nylon in a horizontal mattress fashion.  It was injected with 0.25% plain Marcaine to aid in postoperative analgesia.  It was dressed with sterile Xeroform, 4x4s, an ABD, and wrapped with Kerlix and an Ace bandage.  Tourniquet was deflated at 14 minutes.  Fingertips were pink with brisk capillary refill after deflation of the tourniquet.  Operative drapes were broken down.  The patient was awoken from anesthesia safely.  She was transferred back to stretcher and taken to the PACU in stable condition.  I will see her back in the office  in 1 week for postoperative followup.     Betha Loa, MD Electronically signed, 07/22/20

## 2020-07-22 NOTE — Op Note (Signed)
Orthopaedic Surgery Operative Note (CSN: [id number])  Sandra Moody  [birth year] Date of Surgery: 07/22/2020   Diagnoses:  Right cuff tear  Procedure: Arthroscopic extensive debridement Arthroscopic subacromial decompression Arthroscopic rotator cuff repair Arthroscopic biceps tenodesis Arthroscopic distal clavicle excision   Operative Finding Exam under anesthesia: full motion, no limitation Articular space: No loose bodies, capsule intact, labrum frayed anterior and posterior to biceps Chondral surfaces:Intact, no sign of chondral degeneration on the glenoid or humeral head Biceps: type 2 slap tear Subscapularis: mild fraying superiorly but the anchor itself was intact.  This was debrided back. Superior Cuff: Full-thickness supraspinatus tear with mild retraction Bursal side: Full-thickness supraspinatus tear with clear wear pattern, type II acromion converted to type I.  Successful completion of the planned procedure.  Patient's cuff quality was reasonable.  We had a good cuff repair.  Biceps tenodesis was routine.  Will start early therapy.  Return the case over to Dr. Fredna Dow at the end of our part of the procedure.   Post-operative plan: The patient will be non-weightbearing in a sling for 6 weeks.  The patient will be discharged home.  DVT prophylaxis not indicated in ambulatory upper extremity patient without known risk factors.   Pain control with PRN pain medication preferring oral medicines.  Follow up plan will be scheduled in approximately 7 days for incision check and XR of shoulder.  Can follow standard rotator cuff protocol.  Post-Op Diagnosis: Same Surgeons: Ophelia Charter Assistants:Caroline McBane PA-C Location: [facility] ROOM 1 Anesthesia: General with Exparel interscalene block Antibiotics: Ancef 2 g Tourniquet time: None Estimated Blood Loss: Minimal Complications: None Specimens: None Implants: Implant Name Type Inv. Item Serial No. Manufacturer Lot No. LRB No.  Used Action  IMPLANT SPEEDBRIDGE KIT - [id number] Orthopedic Implant IMPLANT SPEEDBRIDGE KIT  [employer] [id number] Right 1 Implanted  ANCHOR SUT 1.8 FIBERTAK 2 SUT - [id number] Anchor ANCHOR SUT 1.8 FIBERTAK 2 SUT  [employer] [id number] Right 1 Implanted  ANCHOR SUT 1.8 FIBERTAK 2 SUT - [id number] Anchor ANCHOR SUT 1.8 FIBERTAK 2 SUT  [employer] [id number] Right 1 Implanted    Indications for Surgery:   Sandra Moody is a 49 y.o. female with continued shoulder pain refractory to nonoperative measures for extended period of time.  The risks and benefits were explained at length including but not limited to continued pain, cuff failure, biceps tenodesis failure, stiffness, need for further surgery and infection.  Patient wanted to have a combination case with Dr. [name] and thus we lined up on anesthesia for both part of the surgery.   Procedure:   Patient was correctly identified in the preoperative holding area and operative site marked.  Patient brought to OR and positioned beachchair on an Quay table ensuring that all bony prominences were padded and the head was in an appropriate location.  Anesthesia was induced and the operative shoulder was prepped and draped in the usual sterile fashion.  Timeout was called preincision.  A standard posterior viewing portal was made after localizing the portal with a spinal needle.  An anterior accessory portal was also made.  After clearing the articular space the camera was positioned in the subacromial space.  Findings above.    Extensive debridement was performed of the anterior interval tissue, labral fraying and the bursa.  Subacromial decompression: We made a lateral portal with spinal needle guidance. We then proceeded to debride bursal tissue extensively with a shaver and arthrocare device. At that point we continued to identify the  borders of the acromion and identify the spur. We then carefully preserved the deltoid fascia and used a burr to  convert the acromion to a Type 1 flat acromion without issue.  Arthroscopic Rotator Cuff Repair: Tuberosity was prepared with a burr to a bleeding bed.  Following completion of the above we placed 2 4.7 Swivelock anchor loaded with a tape at inserted at the medial articular margin and an scorpion suture passing device, shuttled  sutures medially in a horizontal mattress suture configuration.  We then tied using arthroscopic knot tying techniques  each suture to its partner reducing the tendon at the prepared insertion site.  The fiber tape was not tied. With a medial row suture limbs then incorporated, 2 anteriorly and  2 posteriorly, into each of two 4.75 PEEK SwiveLock anchors, each placed 8 to 10 mm below the tip of the tuberosity and spanning anterior-posterior width of the tear with care to avoid over tensioning.   Biceps tenodesis: We marked the tendon and then performed a tenotomy and debridement of the stump in the articular space. We then identified the biceps tendon in its groove suprapec with the arthroscope in the lateral portal taking care to move from lateral to medial to avoid injury to the subscapularis. At that point we unroofed the tendon itself and mobilized it. An accessory anterior portal was made in line with the tendon and we grasped it from the anterior superior portal and worked from the accessory anterior portal. Two Fibertak 1.24m knotless anchors were placed in the groove and the tendon was secured in a luggage loop style fashion with a pass of the limb of suture through the tendon using a scorpion device to avoid pull-through.  Repair was completed with good tension on the tendon.  Residual stump of the tendon was removed after being resected with a RF ablator.  Distal Clavicle resection:  The scope was placed in the subacromial space from the posterior portal.  A hemostat was placed through the anterior portal and we spread at the [facility].  A burr was then inserted and 10 mm of  distal clavicle was resected taking care to avoid damage to the capsule around the joint and avoiding overhanging bone posteriorly.    The incisions were closed with absorbable monocryl and steri strips.  A sterile dressing was placed.  Fixation was robust and we felt that it was reasonable for Dr. KFredna Dowto continue with his portion of the case for the distal work at the carpal tunnel.  CNoemi Chapel PA-C, present and scrubbed throughout the case, critical for completion in a timely fashion, and for retraction, instrumentation, closure.

## 2020-07-22 NOTE — Transfer of Care (Signed)
Immediate Anesthesia Transfer of Care Note  Patient: Sandra Moody  Procedure(s) Performed: CARPAL TUNNEL RELEASE RIGHT (Right Hand) SHOULDER ARTHROSCOPY WITH SUBACROMIAL DECOMPRESSION AND DISTAL CLAVICLE EXCISION, ROTATOR CUFF REPAIR AND BICEPS TENODESIS (Right Shoulder)  Patient Location: PACU  Anesthesia Type:GA combined with regional for post-op pain  Level of Consciousness: sedated  Airway & Oxygen Therapy: Patient Spontanous Breathing and Patient connected to nasal cannula oxygen  Post-op Assessment: Report given to RN and Post -op Vital signs reviewed and stable  Post vital signs: Reviewed and stable  Last Vitals:  Vitals Value Taken Time  BP 136/102 07/22/20 0916  Temp 36.4 C 07/22/20 0916  Pulse 77 07/22/20 0917  Resp 11 07/22/20 0917  SpO2 96 % 07/22/20 0917  Vitals shown include unvalidated device data.  Last Pain:  Vitals:   07/22/20 0638  TempSrc: Oral  PainSc: 3       Patients Stated Pain Goal: 3 (07/22/20 1497)  Complications: No complications documented.

## 2020-07-22 NOTE — Anesthesia Postprocedure Evaluation (Signed)
Anesthesia Post Note  Patient: Sandra Moody  Procedure(s) Performed: CARPAL TUNNEL RELEASE RIGHT (Right Hand) SHOULDER ARTHROSCOPY WITH SUBACROMIAL DECOMPRESSION AND DISTAL CLAVICLE EXCISION, ROTATOR CUFF REPAIR AND BICEPS TENODESIS (Right Shoulder)     Patient location during evaluation: PACU Anesthesia Type: General Level of consciousness: awake and alert Pain management: pain level controlled Vital Signs Assessment: post-procedure vital signs reviewed and stable Respiratory status: spontaneous breathing, nonlabored ventilation and respiratory function stable Cardiovascular status: blood pressure returned to baseline and stable Postop Assessment: no apparent nausea or vomiting Anesthetic complications: no   No complications documented.  Last Vitals:  Vitals:   07/22/20 0930 07/22/20 1020  BP: (!) 146/88 (!) 154/92  Pulse: 77 82  Resp: 14 16  Temp:  36.5 C  SpO2: 98% 99%    Last Pain:  Vitals:   07/22/20 1020  TempSrc:   PainSc: 0-No pain                 Lowella Curb

## 2020-07-22 NOTE — Interval H&P Note (Signed)
History and Physical Interval Note:  07/22/2020 7:10 AM  Sandra Moody  has presented today for surgery, with the diagnosis of RIGHT CARPAL TUNNEL SYNDROME.  The various methods of treatment have been discussed with the patient and family. After consideration of risks, benefits and other options for treatment, the patient has consented to  Procedure(s) with comments: CARPAL TUNNEL RELEASE (Right) SHOULDER ARTHROSCOPY WITH SUBACROMIAL DECOMPRESSION AND DISTAL CLAVICLE EXCISION, ROTATOR CUFF REPAIR AND BICEPS TENODESIS (Right) - Talin Rozeboom WILL GO FIRST WITH KUZMA TO FOLLOW as a surgical intervention.  The patient's history has been reviewed, patient examined, no change in status, stable for surgery.  I have reviewed the patient's chart and labs.  Questions were answered to the patient's satisfaction.     Bjorn Pippin

## 2020-07-22 NOTE — Anesthesia Procedure Notes (Signed)
Procedure Name: LMA Insertion Date/Time: 07/22/2020 7:35 AM Performed by: Burna Cash, CRNA Pre-anesthesia Checklist: Patient identified, Emergency Drugs available, Suction available and Patient being monitored Patient Re-evaluated:Patient Re-evaluated prior to induction Oxygen Delivery Method: Circle system utilized Preoxygenation: Pre-oxygenation with 100% oxygen Induction Type: IV induction Ventilation: Mask ventilation without difficulty LMA: LMA inserted LMA Size: 4.0 Number of attempts: 1 Airway Equipment and Method: Bite block Placement Confirmation: positive ETCO2 Tube secured with: Tape Dental Injury: Teeth and Oropharynx as per pre-operative assessment

## 2020-07-22 NOTE — Anesthesia Procedure Notes (Signed)
Anesthesia Regional Block: Interscalene brachial plexus block   Pre-Anesthetic Checklist: ,, timeout performed, Correct Patient, Correct Site, Correct Laterality, Correct Procedure, Correct Position, site marked, Risks and benefits discussed,  Surgical consent,  Pre-op evaluation,  At surgeon's request and post-op pain management  Laterality: Right  Prep: chloraprep       Needles:  Injection technique: Single-shot  Needle Type: Stimiplex     Needle Length: 9cm  Needle Gauge: 21     Additional Needles:   Procedures:,,,, ultrasound used (permanent image in chart),,,,  Narrative:  Start time: 07/22/2020 7:10 AM End time: 07/22/2020 7:15 AM Injection made incrementally with aspirations every 5 mL.  Performed by: Personally  Anesthesiologist: Lowella Curb, MD

## 2020-07-26 ENCOUNTER — Encounter (HOSPITAL_BASED_OUTPATIENT_CLINIC_OR_DEPARTMENT_OTHER): Payer: Self-pay | Admitting: Orthopedic Surgery

## 2020-08-19 ENCOUNTER — Other Ambulatory Visit: Payer: Self-pay

## 2020-08-23 ENCOUNTER — Other Ambulatory Visit: Payer: Self-pay

## 2020-08-23 ENCOUNTER — Ambulatory Visit (INDEPENDENT_AMBULATORY_CARE_PROVIDER_SITE_OTHER): Payer: Managed Care, Other (non HMO) | Admitting: Internal Medicine

## 2020-08-23 ENCOUNTER — Encounter: Payer: Self-pay | Admitting: Internal Medicine

## 2020-08-23 VITALS — BP 130/80 | HR 82 | Ht 64.0 in | Wt 169.2 lb

## 2020-08-23 DIAGNOSIS — E1165 Type 2 diabetes mellitus with hyperglycemia: Secondary | ICD-10-CM | POA: Diagnosis not present

## 2020-08-23 DIAGNOSIS — E119 Type 2 diabetes mellitus without complications: Secondary | ICD-10-CM | POA: Diagnosis not present

## 2020-08-23 LAB — POCT GLYCOSYLATED HEMOGLOBIN (HGB A1C): Hemoglobin A1C: 7.7 % — AB (ref 4.0–5.6)

## 2020-08-23 LAB — POCT GLUCOSE (DEVICE FOR HOME USE): POC Glucose: 146 mg/dl — AB (ref 70–99)

## 2020-08-23 MED ORDER — DAPAGLIFLOZIN PROPANEDIOL 5 MG PO TABS: 5.0000 mg | ORAL_TABLET | Freq: Every day | ORAL | 6 refills | Status: DC

## 2020-08-23 MED ORDER — DAPAGLIFLOZIN PROPANEDIOL 5 MG PO TABS
5.0000 mg | ORAL_TABLET | Freq: Every day | ORAL | 6 refills | Status: DC
Start: 2020-08-23 — End: 2020-12-31

## 2020-08-23 NOTE — Progress Notes (Signed)
Name: Sandra Moody  Age/ Sex: 50 y.o., female   MRN/ DOB: [id number], [birth year]     PCP: Wendie Agreste, MD   Reason for Endocrinology Evaluation: Type 2 Diabetes Mellitus  Initial Endocrine Consultative Visit: 09/05/2019    PATIENT IDENTIFIER: Ms. Sandra Moody is a 50 y.o. female with a past medical history of asthma. The patient has followed with Endocrinology clinic since 09/05/2019 for consultative assistance with management of her diabetes.  DIABETIC HISTORY:  Ms. Sandra Moody was diagnosed with DM in 09/2018.  She has been on multiple oral glycemic agents with reported intolerance.   Glipizide caused stomach issues as well as Metformin.  Actos caused hypoglycemia and was discontinued in 07/2019 , she has not been on insulin before. Her hemoglobin A1c has ranged from 6.1% in 08/2019, peaking at 10.3%  in 2020  On her initial visit to our clinic she was not on any oral glycemic agents, and declined any oral medications at the time.  By 12/2019 she presented to her PCP with hypoglycemia and BG is in the 300s patient was started on Januvia at the time.   SUBJECTIVE:   During the last visit (04/21/2020): A1c 8.0 %, We continued Januvia and decreased basal insulin    Today (08/23/2020): Ms. Sandra Moody is here for follow-up on diabetes management She checks her blood sugars 2 times daily. Since her last visit her . She endorses one hypoglycemic episode, but most of her glucose readings  have been 350 mg/dL     HOME DIABETES REGIMEN:  Januvia 100 mg daily  Lantus 40 units daily     METER DOWNLOAD SUMMARY: Did not bring       DIABETIC COMPLICATIONS: Microvascular complications:    Denies: neuropathy , retinopathy,   Last eye exam: Completed 09/2019  Macrovascular complications:    Denies: CAD, PVD, CVA  HISTORY:  Past Medical History:  Past Medical History:  Diagnosis Date  . Acute renal disease   . Asthma   . Bilateral carpal tunnel syndrome 03/11/2019  . Common  migraine with intractable migraine 08/29/2018  . Daily headache    (03/08/2017)  . Essential hypertension 02/20/2019  . GERD (gastroesophageal reflux disease)   . H pylori ulcer ~ 08/2016  . History of hiatal hernia   . Leaky heart valve ~ 09/2016  . Migraine    "maybe weekly" (03/08/2017)  . PONV (postoperative nausea and vomiting)   . Type 2 diabetes mellitus with hyperglycemia, without long-term current use of insulin ([place]) 02/20/2019   Past Surgical History:  Past Surgical History:  Procedure Laterality Date  . CARPAL TUNNEL RELEASE Left 12/18/2019   Procedure: CARPAL TUNNEL RELEASE;  Surgeon: [name], MD;  Location: [facility];  Service: Orthopedics;  Laterality: Left;  . CARPAL TUNNEL RELEASE Right 07/22/2020   Procedure: CARPAL TUNNEL RELEASE RIGHT;  Surgeon: [name], MD;  Location: [facility];  Service: Orthopedics;  Laterality: Right;  . CHOLECYSTECTOMY N/A 03/08/2017   Procedure: LAPAROSCOPIC CHOLECYSTECTOMY;  Surgeon: Coralie Keens, MD;  Location: [facility];  Service: General;  Laterality: N/A;  . DILATION AND CURETTAGE OF UTERUS    . LAPAROSCOPIC CHOLECYSTECTOMY  03/08/2017  . SHOULDER ARTHROSCOPY WITH SUBACROMIAL DECOMPRESSION, ROTATOR CUFF REPAIR AND BICEP TENDON REPAIR Right 07/22/2020   Procedure: SHOULDER ARTHROSCOPY WITH SUBACROMIAL DECOMPRESSION AND DISTAL CLAVICLE EXCISION, ROTATOR CUFF REPAIR AND BICEPS TENODESIS;  Surgeon: Hiram Gash, MD;  Location: [facility];  Service: Orthopedics;  Laterality: Right;  .  TRIGGER FINGER RELEASE Left 12/18/2019   Procedure: RELEASE TRIGGER FINGER/A-1 PULLEY;  Surgeon: [name], MD;  Location: [facility];  Service: Orthopedics;  Laterality: Left;  . TUBAL LIGATION      Social History:  reports that she has never smoked. She has never used smokeless tobacco. She reports previous alcohol use. She reports that she does not use drugs. Family History:  Family History   Problem Relation Age of Onset  . Thyroid disease Mother   . Diabetes Maternal Grandmother   . Heart disease Maternal Grandfather      HOME MEDICATIONS: Allergies as of 08/23/2020      Reactions   Ginger Itching, Swelling   Passion Fruit Flavor [flavoring Agent] Itching, Swelling   Shellfish Allergy Anaphylaxis   Other Other (See Comments)   ALLERGIC TO ALL TREE NUTS ALLERGIC TO ALL TREE NUTS   Tamiflu [oseltamivir Phosphate]       Medication List       Accurate as of August 23, 2020  8:30 AM. If you have any questions, ask your nurse or doctor.        Accu-Chek Guide test strip Generic drug: glucose blood USE TO CHECK GLUCOSE TWICE DAILY,FASTING AND 2 HOURS AFTER DINNER   acetaminophen 500 MG tablet Commonly known as: TYLENOL Take 2 tablets (1,000 mg total) by mouth every 8 (eight) hours.   ALBUTEROL SULFATE PO Take by mouth.   albuterol (2.5 MG/3ML) 0.083% nebulizer solution Commonly known as: PROVENTIL Take 3 mLs (2.5 mg total) by nebulization every 6 (six) hours as needed for wheezing or shortness of breath.   amLODipine 2.5 MG tablet Commonly known as: NORVASC TAKE 1 TABLET BY MOUTH EVERY DAY   BIOTIN PO Take by mouth.   blood glucose meter kit and supplies Per insurance preference. Use to check glucose twice a day (fasting and 2 hours after dinner)  Dx E11.65   clobetasol ointment 0.05 % Commonly known as: TEMOVATE APPLY TOPICALLY TWICE A DAY AS NEEDED FOR ITCHY RASH AS NEEDED   Emgality 120 MG/ML Soaj Generic drug: Galcanezumab-gnlm Inject 120 mg into the skin every 30 (thirty) days.   EpiPen 2-Pak 0.3 mg/0.3 mL Soaj injection Generic drug: EPINEPHrine Inject 0.3 mg into the muscle as needed (for allergic reaction).   fluticasone 50 MCG/ACT nasal spray Commonly known as: FLONASE USE 1 2 SPRAYS IN EACH NOSTRIL ONCE A DAY   ketoconazole 2 % shampoo Commonly known as: NIZORAL Apply 1 application topically 2 (two) times a week. Apply to  affected area lather, leave in place for 5 mins and then rinse off with water   ketoconazole 2 % cream Commonly known as: NIZORAL Apply 1 application topically daily. To seborrhea/face lesion as needed.   Lantus SoloStar 100 UNIT/ML Solostar Pen Generic drug: insulin glargine Inject 40 Units into the skin daily.   levocetirizine 5 MG tablet Commonly known as: XYZAL 1 2 TABLET IN THE EVENING ONCE A DAY ORALLY 15   medroxyPROGESTERone 150 MG/ML injection Commonly known as: DEPO-PROVERA   methocarbamol 500 MG tablet Commonly known as: Robaxin Take 1 tablet (500 mg total) by mouth every 8 (eight) hours as needed for muscle spasms.   montelukast 10 MG tablet Commonly known as: SINGULAIR Take 10 mg by mouth at bedtime.   olopatadine 0.1 % ophthalmic solution Commonly known as: Patanol Place 1 drop into both eyes 2 (two) times daily.   ondansetron 4 MG tablet Commonly known as: Zofran Take 1 tablet (4 mg total)  by mouth every 8 (eight) hours as needed for nausea or vomiting.   oxyCODONE 5 MG immediate release tablet Commonly known as: Roxicodone Take 1-2 tablets every 6 hours as needed for severe pain. Do not take more than 6 tablets in a 24 hour period of time.   Pen Needles 32G X 6 MM Misc Use once a day with lantus injection   potassium chloride 10 MEQ tablet Commonly known as: KLOR-CON Take 1 tablet (10 mEq total) by mouth daily.   pregabalin 100 MG capsule Commonly known as: LYRICA Take 1 capsule (100 mg total) by mouth 3 (three) times daily.   rizatriptan 10 MG tablet Commonly known as: MAXALT Take 1 tablet (10 mg total) by mouth 3 (three) times daily as needed for migraine.   sitaGLIPtin 100 MG tablet Commonly known as: Januvia Take 1 tablet (100 mg total) by mouth daily.   Symbicort 160-4.5 MCG/ACT inhaler Generic drug: budesonide-formoterol INHALE 2 PUFFS INTO THE LUNGS DAILY.   triamcinolone 0.1 % Commonly known as: KENALOG Apply 1 application  topically 2 (two) times daily. To affected areas on face, scalp as needed.   VITAMIN D3 PO Take by mouth.   VITAMIN E PO Take by mouth.        OBJECTIVE:   Vital Signs: BP 130/80   Pulse 82   Ht 5' 4" (1.626 m)   Wt 169 lb 4 oz (76.8 kg)   SpO2 97%   BMI 29.05 kg/m   Wt Readings from Last 3 Encounters:  08/23/20 169 lb 4 oz (76.8 kg)  07/22/20 166 lb 14.2 oz (75.7 kg)  07/07/20 164 lb (74.4 kg)     Exam: General: Pt appears well and is in NAD  Lungs: Clear with good BS bilat with no rales, rhonchi, or wheezes  Heart: RRR with normal S1 and S2 and no gallops; no murmurs; no rub  Abdomen: Normoactive bowel sounds, soft, nontender, without masses or organomegaly palpable  Extremities: No pretibial edema.   Neuro: MS is good with appropriate affect, pt is alert and Ox3     DM foot exam: 09/05/2019  The skin of the feet is intact without sores or ulcerations. The pedal pulses are 2+ on right and 2+ on left. The sensation is intact to a screening 5.07, 10 gram monofilament bilaterally    DATA REVIEWED:  Lab Results  Component Value Date   HGBA1C 7.7 (A) 08/23/2020   HGBA1C 7.5 (A) 06/01/2020   HGBA1C 8.0 (A) 04/21/2020   Lab Results  Component Value Date   LDLCALC 72 08/07/2019   CREATININE 0.85 07/19/2020   Lab Results  Component Value Date   MICRALBCREAT 11 10/10/2019     Lab Results  Component Value Date   CHOL 124 08/07/2019   HDL 35 (L) 08/07/2019   LDLCALC 72 08/07/2019   TRIG 86 08/07/2019   CHOLHDL 3.5 08/07/2019       Glutamic Acid Decarb Ab 0.0 - 5.0 U/mL <5.0    C-Peptide 1.1 - 4.4 ng/mL 8.3High    Islet Cell Ab Neg:<1:1 Negative      ASSESSMENT / PLAN / RECOMMENDATIONS:   1) Type 2 Diabetes Mellitus, Sub-OPtimally controlled, Without complications - Most recent A1c of 7.7 %. Goal A1c < %.      - A1c stable  - Declined CDE/RD referral as he has too many appointment as it is  - Intolerant to Metformin and Glipizide -  Discussed adding an SGLT-2 inhibitors , cautioned against genital infections  MEDICATIONS:  Continue  Januvia 100 mg daily  Continue Lantus to 40 units daily   Start Farxiga 5 mg, 1 tablet daily   EDUCATION / INSTRUCTIONS:  BG monitoring instructions: Patient is instructed to check her blood sugars 2 times a day.  Call [facility] if: BG persistently < 70  . I reviewed the Rule of 15 for the treatment of hypoglycemia in detail with the patient. Literature supplied.    2) Diabetic complications:   Eye: Does not have known diabetic retinopathy.   Neuro/ Feet: Does not have known diabetic peripheral neuropathy .   Renal: Patient does not have known baseline CKD.   F/U in 4 months  Signed electronically by: Mack Guise, MD  [facility]  [facility] [address] [place], [place] [address] Phone: [phone] FAX: [phone]   CC: Wendie Agreste, MD [address] [place] [address] Phone: [phone]  Fax: [phone]  Return to Endocrinology clinic as below: Future Appointments  Date Time Provider [place]  01/18/2021  7:45 AM Suzzanne Cloud, NP [facility] None

## 2020-08-23 NOTE — Patient Instructions (Addendum)
-   Continue Lantus at  40 units daily  - Continue Januvia 100 mg , 1 tablet a day  - Start Farxiga 5 mg, 1 tablet daily with Breakfast      HOW TO TREAT LOW BLOOD SUGARS (Blood sugar LESS THAN 70 MG/DL)  Please follow the RULE OF 15 for the treatment of hypoglycemia treatment (when your (blood sugars are less than 70 mg/dL)    STEP 1: Take 15 grams of carbohydrates when your blood sugar is low, which includes:   3-4 GLUCOSE TABS  OR  3-4 OZ OF JUICE OR REGULAR SODA OR  ONE TUBE OF GLUCOSE GEL     STEP 2: RECHECK blood sugar in 15 MINUTES STEP 3: If your blood sugar is still low at the 15 minute recheck --> then, go back to STEP 1 and treat AGAIN with another 15 grams of carbohydrates.

## 2020-08-27 ENCOUNTER — Other Ambulatory Visit: Payer: Self-pay | Admitting: Internal Medicine

## 2020-08-27 ENCOUNTER — Telehealth: Payer: Self-pay | Admitting: Internal Medicine

## 2020-08-27 MED ORDER — INSULIN GLARGINE-YFGN 100 UNIT/ML ~~LOC~~ SOLN: 40.0000 [IU] | Freq: Every day | SUBCUTANEOUS | 3 refills | Status: DC

## 2020-08-27 MED ORDER — ACCU-CHEK SOFTCLIX LANCETS MISC: 5 refills | Status: DC

## 2020-08-27 NOTE — Telephone Encounter (Signed)
Done 40 units daily

## 2020-08-27 NOTE — Telephone Encounter (Signed)
Please advise 

## 2020-08-27 NOTE — Addendum Note (Signed)
Addended by: Tawnya Crook on: 08/27/2020 04:14 PM   Modules accepted: Orders

## 2020-08-27 NOTE — Telephone Encounter (Signed)
Spoken to patient and notified Dr Shamleffer's comments. Verbalized understanding.   

## 2020-08-27 NOTE — Telephone Encounter (Signed)
Patient called to advise that the Lantus Solostar is not covered by her insurance. Insurance did indicate that they would cover Semglee in its place.   Call back number [phone]

## 2020-08-30 ENCOUNTER — Telehealth: Payer: Self-pay | Admitting: Internal Medicine

## 2020-08-30 NOTE — Telephone Encounter (Signed)
Spoken to patient and notified Dr Shamleffer's comments. Verbalized understanding.   

## 2020-08-30 NOTE — Telephone Encounter (Signed)
Spoken to patient. She stated that for the past a couples days, she have been having stomach pain, headache, and nausea. She believe is its from the [place] since she just started taking it. Patient's blood sugar was 97 last night. Pateint wants to know if she can stop taking [place].  FYI. She did not pick up Semglee insulin yet.

## 2020-08-30 NOTE — Telephone Encounter (Signed)
Patient said since Saturday she has not been able to get her sugar under control. She thinks it has to do with the [place], she also started having really bad stomach pains. She would like a nurse to give her a call back to discuss at [phone]

## 2020-08-30 NOTE — Telephone Encounter (Signed)
Headaches, stomach pains and nausea are NOT a side effects of Farxiga. I think she needs to be checked by her PCP and make sure she doesn;t have some form of infection ( COVID etc ) But she can stop the farxiga for now until she gets  checked for infection.

## 2020-08-31 ENCOUNTER — Other Ambulatory Visit: Payer: Self-pay

## 2020-08-31 ENCOUNTER — Ambulatory Visit (INDEPENDENT_AMBULATORY_CARE_PROVIDER_SITE_OTHER): Payer: Managed Care, Other (non HMO) | Admitting: Registered Nurse

## 2020-08-31 VITALS — BP 130/86 | HR 87 | Temp 98.1°F | Resp 18 | Ht 64.0 in | Wt 167.4 lb

## 2020-08-31 DIAGNOSIS — Z8639 Personal history of other endocrine, nutritional and metabolic disease: Secondary | ICD-10-CM | POA: Diagnosis not present

## 2020-08-31 DIAGNOSIS — R5381 Other malaise: Secondary | ICD-10-CM

## 2020-08-31 DIAGNOSIS — R0981 Nasal congestion: Secondary | ICD-10-CM | POA: Diagnosis not present

## 2020-08-31 DIAGNOSIS — R350 Frequency of micturition: Secondary | ICD-10-CM | POA: Diagnosis not present

## 2020-08-31 LAB — POCT URINALYSIS DIP (CLINITEK)
Bilirubin, UA: NEGATIVE
Blood, UA: NEGATIVE
Glucose, UA: NEGATIVE mg/dL
Ketones, POC UA: NEGATIVE mg/dL
Leukocytes, UA: NEGATIVE
Nitrite, UA: NEGATIVE
POC PROTEIN,UA: NEGATIVE
Spec Grav, UA: 1.025 (ref 1.010–1.025)
Urobilinogen, UA: 0.2 E.U./dL
pH, UA: 6 (ref 5.0–8.0)

## 2020-08-31 LAB — CBC
Hematocrit: 39.7 % (ref 34.0–46.6)
Hemoglobin: 13.2 g/dL (ref 11.1–15.9)
MCH: 30.1 pg (ref 26.6–33.0)
MCHC: 33.2 g/dL (ref 31.5–35.7)
MCV: 91 fL (ref 79–97)
Platelets: 377 10*3/uL (ref 150–450)
RBC: 4.38 x10E6/uL (ref 3.77–5.28)
RDW: 12.1 % (ref 11.7–15.4)
WBC: 7.7 10*3/uL (ref 3.4–10.8)

## 2020-08-31 LAB — BASIC METABOLIC PANEL
BUN/Creatinine Ratio: 6 — ABNORMAL LOW (ref 9–23)
BUN: 6 mg/dL (ref 6–24)
CO2: 20 mmol/L (ref 20–29)
Calcium: 9.8 mg/dL (ref 8.7–10.2)
Chloride: 106 mmol/L (ref 96–106)
Creatinine, Ser: 1.02 mg/dL — ABNORMAL HIGH (ref 0.57–1.00)
GFR calc Af Amer: 76 mL/min/{1.73_m2} (ref >59)
GFR calc non Af Amer: 66 mL/min/{1.73_m2} (ref >59)
Glucose: 86 mg/dL (ref 65–99)
Potassium: 3.9 mmol/L (ref 3.5–5.2)
Sodium: 140 mmol/L (ref 134–144)

## 2020-08-31 NOTE — Progress Notes (Signed)
Acute Office Visit  Subjective:    Patient ID: Sandra Moody, female    DOB: 03-10-1971, 50 y.o.   MRN: [id number]  Chief Complaint  Patient presents with   Ear Pain    Patient states she has been having some ear pain in the left ear and she does not know it it is an ear infection or an medication reaction she has been also feeling a little dizzy and having frequent urination.    HPI Patient is in today for ear pressure R>L Ongoing for 1-2 weeks Feels like it may be associated with starting farxiga - also has some frequent urination Concern for UTI No known covid exposure. No other symptoms at this time.   Past Medical History:  Diagnosis Date   Acute renal disease    Asthma    Bilateral carpal tunnel syndrome 03/11/2019   Common migraine with intractable migraine 08/29/2018   Daily headache    (03/08/2017)   Essential hypertension 02/20/2019   GERD (gastroesophageal reflux disease)    H pylori ulcer ~ 08/2016   History of hiatal hernia    Leaky heart valve ~ 09/2016   Migraine    "maybe weekly" (03/08/2017)   PONV (postoperative nausea and vomiting)    Type 2 diabetes mellitus with hyperglycemia, without long-term current use of insulin ([place]) 02/20/2019    Past Surgical History:  Procedure Laterality Date   CARPAL TUNNEL RELEASE Left 12/18/2019   Procedure: CARPAL TUNNEL RELEASE;  Surgeon: [name], MD;  Location: [place];  Service: Orthopedics;  Laterality: Left;   CARPAL TUNNEL RELEASE Right 07/22/2020   Procedure: CARPAL TUNNEL RELEASE RIGHT;  Surgeon: [name], MD;  Location: [place];  Service: Orthopedics;  Laterality: Right;   CHOLECYSTECTOMY N/A 03/08/2017   Procedure: LAPAROSCOPIC CHOLECYSTECTOMY;  Surgeon: Coralie Keens, MD;  Location: [place];  Service: General;  Laterality: N/A;   DILATION AND CURETTAGE OF UTERUS     LAPAROSCOPIC CHOLECYSTECTOMY  03/08/2017   SHOULDER ARTHROSCOPY WITH SUBACROMIAL DECOMPRESSION, ROTATOR  CUFF REPAIR AND BICEP TENDON REPAIR Right 07/22/2020   Procedure: SHOULDER ARTHROSCOPY WITH SUBACROMIAL DECOMPRESSION AND DISTAL CLAVICLE EXCISION, ROTATOR CUFF REPAIR AND BICEPS TENODESIS;  Surgeon: Hiram Gash, MD;  Location: [place];  Service: Orthopedics;  Laterality: Right;   TRIGGER FINGER RELEASE Left 12/18/2019   Procedure: RELEASE TRIGGER FINGER/A-1 PULLEY;  Surgeon: [name], MD;  Location: [place];  Service: Orthopedics;  Laterality: Left;   TUBAL LIGATION      Family History  Problem Relation Age of Onset   Thyroid disease Mother    Diabetes Maternal Grandmother    Heart disease Maternal Grandfather     Social History   Socioeconomic History   Marital status: Significant Other    Spouse name: Not on file   Number of children: 3   Years of education: Not on file   Highest education level: 12th grade  Occupational History    Comment: [employer]   Tobacco Use   Smoking status: Never Smoker   Smokeless tobacco: Never Used  Vaping Use   Vaping Use: Never used  Substance and Sexual Activity   Alcohol use: Not Currently    Comment: 03/08/2017 "might have a drink on a holiday"   Drug use: No   Sexual activity: Yes    Partners: Male    Birth control/protection: Injection  Other Topics Concern   Not on file  Social History Narrative   **  Merged History Encounter **   Right handed    Lives at home with significant other 2 cups daily of caffeine    Social Determinants of Health   Financial Resource Strain: Not on file  Food Insecurity: Not on file  Transportation Needs: Not on file  Physical Activity: Not on file  Stress: Not on file  Social Connections: Not on file  Intimate Partner Violence: Not on file    Outpatient Medications Prior to Visit  Medication Sig Dispense Refill   Accu-Chek FastClix Lancets MISC USE TO CHECK GLUCOSE TWICE A DAY ,FASTING AND 2 HOURS AFTER DINNER 102 each 5   ACCU-CHEK GUIDE test strip  USE TO CHECK GLUCOSE TWICE DAILY,FASTING AND 2 HOURS AFTER DINNER 100 strip 5   Accu-Chek Softclix Lancets lancets Use as instructed to check blood sugar 2 times a day 100 each 5   acetaminophen (TYLENOL) 500 MG tablet Take 2 tablets (1,000 mg total) by mouth every 8 (eight) hours. 100 tablet 0   albuterol (PROVENTIL) (2.5 MG/3ML) 0.083% nebulizer solution Take 3 mLs (2.5 mg total) by nebulization every 6 (six) hours as needed for wheezing or shortness of breath. 75 mL 1   ALBUTEROL SULFATE PO Take by mouth.     amLODipine (NORVASC) 2.5 MG tablet TAKE 1 TABLET BY MOUTH EVERY DAY 90 tablet 1   BIOTIN PO Take by mouth.     blood glucose meter kit and supplies Per insurance preference. Use to check glucose twice a day (fasting and 2 hours after dinner)  Dx E11.65 1 each 5   Cholecalciferol (VITAMIN D3 PO) Take by mouth.     clobetasol ointment (TEMOVATE) 0.05 % APPLY TOPICALLY TWICE A DAY AS NEEDED FOR ITCHY RASH AS NEEDED 30 g 0   EPIPEN 2-PAK 0.3 MG/0.3ML SOAJ injection Inject 0.3 mg into the muscle as needed (for allergic reaction).   2   fluticasone (FLONASE) 50 MCG/ACT nasal spray USE 1 2 SPRAYS IN EACH NOSTRIL ONCE A DAY     Galcanezumab-gnlm (EMGALITY) 120 MG/ML SOAJ Inject 120 mg into the skin every 30 (thirty) days. 1 pen 11   Insulin Glargine-yfgn (SEMGLEE, YFGN,) 100 UNIT/ML SOLN Inject 40 Units into the skin daily at 2 PM. 30 mL 3   Insulin Pen Needle (PEN NEEDLES) 32G X 6 MM MISC Use once a day with lantus injection 100 each 2   ketoconazole (NIZORAL) 2 % cream Apply 1 application topically daily. To seborrhea/face lesion as needed. 15 g 0   ketoconazole (NIZORAL) 2 % shampoo Apply 1 application topically 2 (two) times a week. Apply to affected area lather, leave in place for 5 mins and then rinse off with water 120 mL 5   levocetirizine (XYZAL) 5 MG tablet 1 2 TABLET IN THE EVENING ONCE A DAY ORALLY 15     medroxyPROGESTERone (DEPO-PROVERA) 150 MG/ML injection      methocarbamol  (ROBAXIN) 500 MG tablet Take 1 tablet (500 mg total) by mouth every 8 (eight) hours as needed for muscle spasms. 60 tablet 0   montelukast (SINGULAIR) 10 MG tablet Take 10 mg by mouth at bedtime.     olopatadine (PATANOL) 0.1 % ophthalmic solution Place 1 drop into both eyes 2 (two) times daily. 5 mL 12   ondansetron (ZOFRAN) 4 MG tablet Take 1 tablet (4 mg total) by mouth every 8 (eight) hours as needed for nausea or vomiting. 10 tablet 0   oxyCODONE (ROXICODONE) 5 MG immediate release tablet Take 1-2 tablets every  6 hours as needed for severe pain. Do not take more than 6 tablets in a 24 hour period of time. 24 tablet 0   potassium chloride (KLOR-CON) 10 MEQ tablet Take 1 tablet (10 mEq total) by mouth daily. 30 tablet 2   pregabalin (LYRICA) 100 MG capsule Take 1 capsule (100 mg total) by mouth 3 (three) times daily. 90 capsule 3   rizatriptan (MAXALT) 10 MG tablet Take 1 tablet (10 mg total) by mouth 3 (three) times daily as needed for migraine. 6 tablet 6   sitaGLIPtin (JANUVIA) 100 MG tablet Take 1 tablet (100 mg total) by mouth daily. 90 tablet 3   SYMBICORT 160-4.5 MCG/ACT inhaler INHALE 2 PUFFS INTO THE LUNGS DAILY. 10.2 Inhaler 0   triamcinolone (KENALOG) 0.1 % Apply 1 application topically 2 (two) times daily. To affected areas on face, scalp as needed. 30 g 0   VITAMIN E PO Take by mouth.     dapagliflozin propanediol (FARXIGA) 5 MG TABS tablet Take 1 tablet (5 mg total) by mouth daily before breakfast. (Patient not taking: Reported on 08/31/2020) 30 tablet 6   No facility-administered medications prior to visit.    Allergies  Allergen Reactions   Ginger Itching and Swelling   Passion Fruit Flavor [Flavoring Agent] Itching and Swelling   Shellfish Allergy Anaphylaxis   Other Other (See Comments)    ALLERGIC TO ALL TREE NUTS ALLERGIC TO ALL TREE NUTS   Tamiflu [Oseltamivir Phosphate]     Review of Systems  Constitutional: Negative.   HENT: Negative.    Eyes: Negative.    Respiratory: Negative.    Cardiovascular: Negative.   Gastrointestinal: Negative.   Genitourinary: Negative.   Musculoskeletal: Negative.   Skin: Negative.   Neurological: Negative.   Psychiatric/Behavioral: Negative.    All other systems reviewed and are negative.     Objective:    Physical Exam Vitals and nursing note reviewed.  Constitutional:      General: She is not in acute distress.    Appearance: Normal appearance. She is not ill-appearing, toxic-appearing or diaphoretic.  HENT:     Right Ear: Tympanic membrane, ear canal and external ear normal. There is no impacted cerumen.     Left Ear: Tympanic membrane, ear canal and external ear normal. There is no impacted cerumen.  Cardiovascular:     Rate and Rhythm: Normal rate and regular rhythm.     Pulses: Normal pulses.     Heart sounds: Normal heart sounds. No murmur heard.   No friction rub. No gallop.  Pulmonary:     Effort: Pulmonary effort is normal. No respiratory distress.     Breath sounds: Normal breath sounds. No stridor. No wheezing, rhonchi or rales.  Chest:     Chest wall: No tenderness.  Skin:    General: Skin is warm and dry.     Capillary Refill: Capillary refill takes less than 2 seconds.  Neurological:     General: No focal deficit present.     Mental Status: She is alert and oriented to person, place, and time. Mental status is at baseline.  Psychiatric:        Mood and Affect: Mood normal.        Behavior: Behavior normal.        Thought Content: Thought content normal.        Judgment: Judgment normal.    BP 130/86    Pulse 87    Temp 98.1 F (36.7 C) (Temporal)  Resp 18    Ht 5' 4"  (1.626 m)    Wt 167 lb 6.4 oz (75.9 kg)    SpO2 95%    BMI 28.73 kg/m  Wt Readings from Last 3 Encounters:  08/31/20 167 lb 6.4 oz (75.9 kg)  08/23/20 169 lb 4 oz (76.8 kg)  07/22/20 166 lb 14.2 oz (75.7 kg)    Health Maintenance Due  Topic Date Due   URINE MICROALBUMIN  10/09/2020    There are no  preventive care reminders to display for this patient.   Lab Results  Component Value Date   TSH 1.590 06/01/2020   Lab Results  Component Value Date   WBC 9.2 06/01/2020   HGB 13.3 06/01/2020   HCT 39.4 06/01/2020   MCV 93.0 06/01/2020   PLT 328 01/25/2020   PLT 328 01/25/2020   Lab Results  Component Value Date   NA 140 07/19/2020   K 4.0 07/19/2020   CO2 26 07/19/2020   GLUCOSE 117 (H) 07/19/2020   BUN 6 07/19/2020   CREATININE 0.85 07/19/2020   BILITOT 0.2 03/05/2020   ALKPHOS 80 03/05/2020   AST 23 03/05/2020   ALT 23 03/05/2020   PROT 6.6 03/05/2020   ALBUMIN 4.3 03/05/2020   CALCIUM 9.0 07/19/2020   ANIONGAP 9 07/19/2020   Lab Results  Component Value Date   CHOL 124 08/07/2019   Lab Results  Component Value Date   HDL 35 (L) 08/07/2019   Lab Results  Component Value Date   LDLCALC 72 08/07/2019   Lab Results  Component Value Date   TRIG 86 08/07/2019   Lab Results  Component Value Date   CHOLHDL 3.5 08/07/2019   Lab Results  Component Value Date   HGBA1C 7.7 (A) 08/23/2020       Assessment & Plan:   Problem List Items Addressed This Visit   None    Visit Diagnoses     Frequent urination    -  Primary   Relevant Orders   POCT URINALYSIS DIP (CLINITEK) (Completed)   CBC   Basic Metabolic Panel   Novel Coronavirus, NAA ([facility])   Nasal congestion       Relevant Orders   Novel Coronavirus, NAA ([facility])   Malaise       Relevant Orders   CBC   Basic Metabolic Panel   History of hypokalemia       Relevant Orders   Basic Metabolic Panel        No orders of the defined types were placed in this encounter.  PLAN Covid testing collected, will follow up as warranted Poct UA not suggestive of uti.  Cbc and bmp collected. Suggest otc antihistamine and flonase for symptoms Follow up in 1-2 weeks if worsening or failing to improve Patient encouraged to call clinic with any questions, comments, or concerns.   Maximiano Coss,  NP

## 2020-08-31 NOTE — Patient Instructions (Signed)
° ° ° °  If you have lab work done today you will be contacted with your lab results within the next 2 weeks.  If you have not heard from us then please contact us. The fastest way to get your results is to register for My Chart. ° ° °IF you received an x-ray today, you will receive an invoice from [facility]. Please contact [facility] at [phone] with questions or concerns regarding your invoice.  ° °IF you received labwork today, you will receive an invoice from [facility]. Please contact [facility] at [phone] with questions or concerns regarding your invoice.  ° °Our billing staff will not be able to assist you with questions regarding bills from these companies. ° °You will be contacted with the lab results as soon as they are available. The fastest way to get your results is to activate your My Chart account. Instructions are located on the last page of this paperwork. If you have not heard from us regarding the results in 2 weeks, please contact this office. °  ° ° ° °

## 2020-09-01 LAB — SARS-COV-2, NAA 2 DAY TAT

## 2020-09-01 LAB — NOVEL CORONAVIRUS, NAA: SARS-CoV-2, NAA: NOT DETECTED

## 2020-09-04 ENCOUNTER — Other Ambulatory Visit: Payer: Self-pay | Admitting: Family Medicine

## 2020-09-04 DIAGNOSIS — E876 Hypokalemia: Secondary | ICD-10-CM

## 2020-09-04 NOTE — Telephone Encounter (Signed)
Requested Prescriptions  Pending Prescriptions Disp Refills  . potassium chloride (KLOR-CON) 10 MEQ tablet [Pharmacy Med Name: POTASSIUM CL ER 10 MEQ TABLET] 90 tablet 1    Sig: TAKE 1 TABLET BY MOUTH EVERY DAY     Endocrinology:  Minerals - Potassium Supplementation Failed - 09/04/2020 11:18 PM      Failed - Cr in normal range and within 360 days    Creatinine, Ser  Date Value Ref Range Status  08/31/2020 1.02 (H) 0.57 - 1.00 mg/dL Final         Passed - K in normal range and within 360 days    Potassium  Date Value Ref Range Status  08/31/2020 3.9 3.5 - 5.2 mmol/L Final         Passed - Valid encounter within last 12 months    Recent Outpatient Visits          4 days ago Frequent urination   [facility], Gerlene Burdock, NP   1 month ago Pain at injection site, subsequent encounter   [facility], Asencion Partridge, MD   2 months ago Left ear pain   [facility], Asencion Partridge, MD   3 months ago Type 2 diabetes mellitus without complication, without long-term current use of insulin [facility])   [facility], Richard, NP   3 months ago Type 2 diabetes mellitus without complication, without long-term current use of insulin ([facility])   [facility], Azalee Course, FNP

## 2020-10-29 ENCOUNTER — Other Ambulatory Visit: Payer: Self-pay | Admitting: Neurology

## 2020-11-02 ENCOUNTER — Encounter (HOSPITAL_COMMUNITY): Payer: Self-pay | Admitting: Emergency Medicine

## 2020-11-02 ENCOUNTER — Other Ambulatory Visit: Payer: Self-pay

## 2020-11-02 ENCOUNTER — Ambulatory Visit (HOSPITAL_COMMUNITY)
Admission: EM | Admit: 2020-11-02 | Discharge: 2020-11-02 | Disposition: A | Discharge status: Home / Self Care | Payer: Managed Care, Other (non HMO)

## 2020-11-02 DIAGNOSIS — M542 Cervicalgia: Secondary | ICD-10-CM

## 2020-11-02 DIAGNOSIS — H9201 Otalgia, right ear: Secondary | ICD-10-CM

## 2020-11-02 NOTE — ED Provider Notes (Signed)
[place]    CSN: [id number] Arrival date & time: 11/02/20  9030      History   Chief Complaint Chief Complaint  Patient presents with  . Otalgia    HPI Sandra Moody is a 50 y.o. female.   Ms. Sandra Moody presents today with a 1 day history of right otalgia. Reports pain was severe (10/10) last night but she took some medication and was able to go to sleep. Today, pain has improved and is rated 5 on a 0-10 pain scale, localized to right ear with radiation into neck, described as throbbing, worse with movement, no alleviating factors identified. She denies any recent illness or additional symptoms including congestion, sore throat, fever, cough, chest pain, shortness of breath. She denies any recent antibiotic use. She has a history of otitis media several years ago but states current symptoms are not similar to previous episodes of this condition. She has not seen an ENT. She does have a history of right neck and shoulder pain; had rotator cuff repair in December 2020 and has had pain and swelling since that time. She has been using heat with improvement of symptoms. She denies any recent swimming, airplane travel, q-tip use.      Past Medical History:  Diagnosis Date  . Acute renal disease   . Asthma   . Bilateral carpal tunnel syndrome 03/11/2019  . Common migraine with intractable migraine 08/29/2018  . Daily headache    (03/08/2017)  . Essential hypertension 02/20/2019  . GERD (gastroesophageal reflux disease)   . H pylori ulcer ~ 08/2016  . History of hiatal hernia   . Leaky heart valve ~ 09/2016  . Migraine    "maybe weekly" (03/08/2017)  . PONV (postoperative nausea and vomiting)   . Type 2 diabetes mellitus with hyperglycemia, without long-term current use of insulin ([place]) 02/20/2019    Patient Active Problem List   Diagnosis Date Noted  . Entrapment of left ulnar nerve 04/28/2020  . Diabetic polyneuropathy associated with type 2 diabetes mellitus ([place]) 03/05/2020   . Type 2 diabetes mellitus without complication, without long-term current use of insulin ([place]) 09/05/2019  . Trigger finger of left thumb 04/30/2019  . Infection of flexor tendon sheath 04/15/2019  . Carpal tunnel syndrome, bilateral 03/11/2019  . Essential hypertension 02/20/2019  . Type 2 diabetes mellitus with hyperglycemia, without long-term current use of insulin ([place]) 02/20/2019  . Other acute sinusitis 12/25/2018  . Irritable bowel syndrome 12/04/2018  . Migraine with aura 12/04/2018  . Common migraine with intractable migraine 08/29/2018  . Moderate persistent asthma without complication 04/22/3006  . Seasonal and perennial allergic rhinitis 06/28/2017  . Mild persistent asthma with acute exacerbation 06/28/2017  . Gastroesophageal reflux disease 06/28/2017  . Biliary dyskinesia 03/08/2017  . Asthma 03/02/2016  . Asthma with acute exacerbation 10/04/2015  . Other and unspecified ovarian cyst 06/17/2012    Past Surgical History:  Procedure Laterality Date  . CARPAL TUNNEL RELEASE Left 12/18/2019   Procedure: CARPAL TUNNEL RELEASE;  Surgeon: [name], MD;  Location: [facility];  Service: Orthopedics;  Laterality: Left;  . CARPAL TUNNEL RELEASE Right 07/22/2020   Procedure: CARPAL TUNNEL RELEASE RIGHT;  Surgeon: [name], MD;  Location: [facility];  Service: Orthopedics;  Laterality: Right;  . CHOLECYSTECTOMY N/A 03/08/2017   Procedure: LAPAROSCOPIC CHOLECYSTECTOMY;  Surgeon: Coralie Keens, MD;  Location: [facility];  Service: General;  Laterality: N/A;  . DILATION AND CURETTAGE OF UTERUS    .  LAPAROSCOPIC CHOLECYSTECTOMY  03/08/2017  . SHOULDER ARTHROSCOPY WITH SUBACROMIAL DECOMPRESSION, ROTATOR CUFF REPAIR AND BICEP TENDON REPAIR Right 07/22/2020   Procedure: SHOULDER ARTHROSCOPY WITH SUBACROMIAL DECOMPRESSION AND DISTAL CLAVICLE EXCISION, ROTATOR CUFF REPAIR AND BICEPS TENODESIS;  Surgeon: Hiram Gash, MD;  Location: [facility];  Service: Orthopedics;  Laterality: Right;  . TRIGGER FINGER RELEASE Left 12/18/2019   Procedure: RELEASE TRIGGER FINGER/A-1 PULLEY;  Surgeon: [name], MD;  Location: [facility];  Service: Orthopedics;  Laterality: Left;  . TUBAL LIGATION      OB History    Gravida  5   Para  3   Term  3   Preterm  0   AB  2   Living        SAB  1   IAB  1   Ectopic  0   Multiple      Live Births               Home Medications    Prior to Admission medications   Medication Sig Start Date End Date Taking? Authorizing Provider  amLODipine (NORVASC) 2.5 MG tablet TAKE 1 TABLET BY MOUTH EVERY DAY 04/27/20  Yes Jacelyn Pi, Lilia Argue, MD  BIOTIN PO Take by mouth.   Yes [provider]  Cholecalciferol (VITAMIN D3 PO) Take by mouth.   Yes [provider]  fluticasone (FLONASE) 50 MCG/ACT nasal spray USE 1 2 SPRAYS IN EACH NOSTRIL ONCE A DAY 10/25/18  Yes [provider]  Insulin Glargine-yfgn (SEMGLEE, YFGN,) 100 UNIT/ML SOLN Inject 40 Units into the skin daily at 2 PM. 08/27/20  Yes Shamleffer, Melanie Crazier, MD  levocetirizine (XYZAL) 5 MG tablet 1 2 TABLET IN THE EVENING ONCE A DAY ORALLY 15 11/05/18  Yes [provider]  montelukast (SINGULAIR) 10 MG tablet Take 10 mg by mouth at bedtime.   Yes [provider]  olopatadine (PATANOL) 0.1 % ophthalmic solution Place 1 drop into both eyes 2 (two) times daily. 12/25/18  Yes Corum, Rex Kras, MD  potassium chloride (KLOR-CON) 10 MEQ tablet TAKE 1 TABLET BY MOUTH EVERY DAY 09/04/20  Yes Wendie Agreste, MD  sitaGLIPtin (JANUVIA) 100 MG tablet Take 1 tablet (100 mg total) by mouth daily. 04/21/20  Yes Shamleffer, Melanie Crazier, MD  SYMBICORT 160-4.5 MCG/ACT inhaler INHALE 2 PUFFS INTO THE LUNGS DAILY. 05/09/18  Yes Valentina Shaggy, MD  VITAMIN E PO Take by mouth.   Yes [provider]  Accu-Chek FastClix Lancets MISC USE TO CHECK GLUCOSE TWICE A DAY ,FASTING AND  2 HOURS AFTER DINNER 08/30/20   Shamleffer, Melanie Crazier, MD  ACCU-CHEK GUIDE test strip USE TO CHECK GLUCOSE TWICE DAILY,FASTING AND 2 HOURS AFTER DINNER 05/09/20   Jacelyn Pi, Lilia Argue, MD  Accu-Chek Softclix Lancets lancets Use as instructed to check blood sugar 2 times a day 08/27/20   Shamleffer, Melanie Crazier, MD  acetaminophen (TYLENOL) 500 MG tablet Take 2 tablets (1,000 mg total) by mouth every 8 (eight) hours. 07/22/20   McBane, Maylene Roes, PA-C  albuterol (PROVENTIL) (2.5 MG/3ML) 0.083% nebulizer solution Take 3 mLs (2.5 mg total) by nebulization every 6 (six) hours as needed for wheezing or shortness of breath. 11/13/19   Wendie Agreste, MD  ALBUTEROL SULFATE PO Take by mouth.    [provider]  blood glucose meter kit and supplies Per insurance preference. Use to check glucose twice a day (fasting and 2 hours after dinner)  Dx E11.65  10/30/18   Jacelyn Pi, Lilia Argue, MD  clobetasol ointment (TEMOVATE) 0.05 % APPLY TOPICALLY TWICE A DAY AS NEEDED FOR ITCHY RASH AS NEEDED 06/28/20   Wendie Agreste, MD  dapagliflozin propanediol (FARXIGA) 5 MG TABS tablet Take 1 tablet (5 mg total) by mouth daily before breakfast. Patient not taking: Reported on 08/31/2020 08/23/20   Shamleffer, Melanie Crazier, MD  EMGALITY 120 MG/ML SOAJ INJECT 120 MG INTO THE SKIN EVERY 30 (THIRTY) DAYS. 10/29/20   Suzzanne Cloud, NP  EPIPEN 2-PAK 0.3 MG/0.3ML SOAJ injection Inject 0.3 mg into the muscle as needed (for allergic reaction).  05/18/16   [provider]  Insulin Pen Needle (PEN NEEDLES) 32G X 6 MM MISC Use once a day with lantus injection 02/13/20   Jacelyn Pi, Lilia Argue, MD  ketoconazole (NIZORAL) 2 % cream Apply 1 application topically daily. To seborrhea/face lesion as needed. 06/14/20   Wendie Agreste, MD  ketoconazole (NIZORAL) 2 % shampoo Apply 1 application topically 2 (two) times a week. Apply to affected area lather, leave in place for 5 mins and then rinse off with water  01/15/20   Jacelyn Pi, Lilia Argue, MD  medroxyPROGESTERone (DEPO-PROVERA) 150 MG/ML injection  08/15/18   [provider]  methocarbamol (ROBAXIN) 500 MG tablet Take 1 tablet (500 mg total) by mouth every 8 (eight) hours as needed for muscle spasms. Patient not taking: Reported on 11/02/2020 07/07/20   Wendie Agreste, MD  ondansetron (ZOFRAN) 4 MG tablet Take 1 tablet (4 mg total) by mouth every 8 (eight) hours as needed for nausea or vomiting. 07/22/20   McBane, Maylene Roes, PA-C  oxyCODONE (ROXICODONE) 5 MG immediate release tablet Take 1-2 tablets every 6 hours as needed for severe pain. Do not take more than 6 tablets in a 24 hour period of time. 07/22/20   McBane, Maylene Roes, PA-C  pregabalin (LYRICA) 100 MG capsule Take 1 capsule (100 mg total) by mouth 3 (three) times daily. 03/05/20   Daleen Squibb, MD  rizatriptan (MAXALT) 10 MG tablet Take 1 tablet (10 mg total) by mouth 3 (three) times daily as needed for migraine. 05/20/20   Suzzanne Cloud, NP  triamcinolone (KENALOG) 0.1 % Apply 1 application topically 2 (two) times daily. To affected areas on face, scalp as needed. 06/14/20   Wendie Agreste, MD    Family History Family History  Problem Relation Age of Onset  . Thyroid disease Mother   . Diabetes Maternal Grandmother   . Heart disease Maternal Grandfather     Social History Social History   Tobacco Use  . Smoking status: Never Smoker  . Smokeless tobacco: Never Used  Vaping Use  . Vaping Use: Never used  Substance Use Topics  . Alcohol use: Not Currently    Comment: 03/08/2017 "might have a drink on a holiday"  . Drug use: No     Allergies   Ginger, Passion fruit flavor [flavoring agent], Shellfish allergy, Other, and Tamiflu [oseltamivir phosphate]   Review of Systems Review of Systems  Constitutional: Positive for activity change. Negative for appetite change, fatigue and fever.  HENT: Positive for ear pain. Negative for congestion, ear discharge,  hearing loss, sinus pressure, sneezing and sore throat.   Respiratory: Negative for cough and shortness of breath.   Cardiovascular: Negative for chest pain.  Gastrointestinal: Negative for abdominal pain, diarrhea, nausea and vomiting.  Musculoskeletal: Positive for arthralgias (right shoulder) and neck pain. Negative for back pain.  Neurological:  Positive for headaches. Negative for dizziness and light-headedness.     Physical Exam Triage Vital Signs ED Triage Vitals  Enc Vitals Group     BP 11/02/20 0910 126/89     Pulse Rate 11/02/20 0910 (!) 101     Resp 11/02/20 0910 18     Temp 11/02/20 0910 99 F (37.2 C)     Temp Source 11/02/20 0910 Oral     SpO2 11/02/20 0910 99 %     Weight --      Height --      Head Circumference --      Peak Flow --      Pain Score 11/02/20 0903 5     Pain Loc --      Pain Edu? --      Excl. in [place]? --    No data found.  Updated Vital Signs BP 126/89 (BP Location: Left Arm)   Pulse (!) 101   Temp 99 F (37.2 C) (Oral)   Resp 18   SpO2 99%   Visual Acuity Right Eye Distance:   Left Eye Distance:   Bilateral Distance:    Right Eye Near:   Left Eye Near:    Bilateral Near:     Physical Exam Vitals reviewed.  Constitutional:      General: She is awake. She is not in acute distress.    Appearance: Normal appearance. She is not ill-appearing.     Comments: Very pleasant female appears stated age in no acute distress.   HENT:     Head: Normocephalic and atraumatic.     Right Ear: Tympanic membrane, ear canal and external ear normal. No mastoid tenderness. Tympanic membrane is not erythematous or bulging.     Left Ear: Tympanic membrane, ear canal and external ear normal. No mastoid tenderness. Tympanic membrane is not erythematous or bulging.     Ears:     Comments: No pain with manipulation of external ear/ palpation of tragus. Normal appearing TM.     Nose:     Right Sinus: No maxillary sinus tenderness or frontal sinus  tenderness.     Left Sinus: No maxillary sinus tenderness or frontal sinus tenderness.     Mouth/Throat:     Pharynx: Uvula midline. No oropharyngeal exudate or posterior oropharyngeal erythema.  Cardiovascular:     Rate and Rhythm: Normal rate and regular rhythm.     Heart sounds: No murmur heard.   Pulmonary:     Effort: Pulmonary effort is normal.     Breath sounds: Normal breath sounds. No wheezing, rhonchi or rales.     Comments: Clear to auscultation bilaterally Musculoskeletal:     Cervical back: Tenderness present. No bony tenderness. Pain with movement (with rotation) present. No spinous process tenderness or muscular tenderness.     Right lower leg: No edema.     Left lower leg: No edema.  Lymphadenopathy:     Head:     Right side of head: No submental, submandibular or tonsillar adenopathy.     Left side of head: No submental, submandibular or tonsillar adenopathy.     Cervical: No cervical adenopathy.  Psychiatric:        Behavior: Behavior is cooperative.      UC Treatments / Results  Labs (all labs ordered are listed, but only abnormal results are displayed) Labs Reviewed - No data to display  EKG   Radiology No results found.  Procedures Procedures (including critical care time)  Medications Ordered  in UC Medications - No data to display  Initial Impression / Assessment and Plan / UC Course  I have reviewed the triage vital signs and the nursing notes.  Pertinent labs & imaging results that were available during my care of the patient were reviewed by me and considered in my medical decision making (see chart for details).     No evidence of infection that would warrant initiation on antibiotics on exam today. Suspect musculoskeletal etiology given chronic right shoulder and neck pain and associated tenderness to palpation on exam. She can continue over the counter medication for symptom relief as well as heat. Recommended she follow up with PCP for  further evaluation. Strict return precautions given to which patient expressed understanding.   Final Clinical Impressions(s) / UC Diagnoses   Final diagnoses:  Otalgia, right  Neck pain     Discharge Instructions     There was no evidence of an infection. Please continue your current medication and heat as we discussed. If anything changes please come back to see [place].     ED Prescriptions    None     PDMP not reviewed this encounter.   Terrilee Croak, PA-C 11/02/20 1002

## 2020-11-02 NOTE — ED Triage Notes (Signed)
r ear and area below right ear is painful.  Patient noticed pain 2 days ago.  Last night, pain woke patient .  Denies runny nose or cough

## 2020-11-02 NOTE — Discharge Instructions (Addendum)
There was no evidence of an infection. Please continue your current medication and heat as we discussed. If anything changes please come back to see [place].

## 2020-11-19 ENCOUNTER — Telehealth: Payer: Self-pay | Admitting: Internal Medicine

## 2020-11-19 MED ORDER — PEN NEEDLES 32G X 6 MM MISC: 2 refills | Status: DC

## 2020-11-19 NOTE — Telephone Encounter (Signed)
MEDICATION: Pen Needles  PHARMACY:  [facility] [address]  HAS THE PATIENT CONTACTED THEIR PHARMACY?  No, this is 1st RX from Dr [employer]  IS THIS A 90 DAY SUPPLY :   IS PATIENT OUT OF MEDICATION: no   IF NOT; HOW MUCH IS LEFT: 5 pen needles left  LAST APPOINTMENT DATE: @1 /31/2022  NEXT APPOINTMENT DATE:@5 /26/2022  DO WE HAVE YOUR PERMISSION TO LEAVE A DETAILED MESSAGE?: yes  OTHER COMMENTS:    **Let patient know to contact pharmacy at the end of the day to make sure medication is ready. **  ** Please notify patient to allow 48-72 hours to process**  **Encourage patient to contact the pharmacy for refills or they can request refills through [facility]**

## 2020-11-19 NOTE — Telephone Encounter (Signed)
Refill as requested 

## 2020-11-20 ENCOUNTER — Telehealth: Payer: Self-pay | Admitting: Nurse Practitioner

## 2020-11-20 ENCOUNTER — Other Ambulatory Visit: Payer: Self-pay | Admitting: Nurse Practitioner

## 2020-11-20 MED ORDER — CLENPIQ 10-3.5-12 MG-GM -GM/160ML PO SOLN: ORAL | 0 refills | Status: DC

## 2020-11-20 NOTE — Telephone Encounter (Signed)
Patient called answering service. She is for a colonoscopy on Monday with Dr. Loreta Ave and says bowel prep was not called to pharmacy. She was supposed to get Clenpiq. Will send Rx to pharmacy. She has directions on how to take it.

## 2020-11-22 ENCOUNTER — Ambulatory Visit (INDEPENDENT_AMBULATORY_CARE_PROVIDER_SITE_OTHER): Payer: Managed Care, Other (non HMO) | Admitting: Otolaryngology

## 2020-11-22 MED ORDER — PEN NEEDLES 32G X 6 MM MISC: 2 refills | Status: DC

## 2020-11-22 NOTE — Telephone Encounter (Signed)
I have resent the Rx for the pen needles.  Noted. On 11/19/2020, I had sent the refill for the pen needles that was already on patient's current medication list, this is also genetic. We cannot control what brand the pharmacy have available.

## 2020-11-22 NOTE — Addendum Note (Signed)
Addended by: Tawnya Crook on: 11/22/2020 10:37 AM   Modules accepted: Orders

## 2020-11-22 NOTE — Telephone Encounter (Signed)
Pt called back for F/u on last message voiced that when she went to the pharmacy the pen needles looked different than what she normally uses.  She needs Insulin Pen Needle (PEN NEEDLES) 32G X 6 MM MISC   [id number] [place], [place] - [address]

## 2020-12-15 ENCOUNTER — Other Ambulatory Visit: Payer: Self-pay

## 2020-12-15 ENCOUNTER — Ambulatory Visit (HOSPITAL_COMMUNITY)
Admission: EM | Admit: 2020-12-15 | Discharge: 2020-12-15 | Disposition: A | Discharge status: Home / Self Care | Payer: Managed Care, Other (non HMO) | Attending: Family Medicine | Admitting: Family Medicine

## 2020-12-15 DIAGNOSIS — Z20822 Contact with and (suspected) exposure to covid-19: Secondary | ICD-10-CM

## 2020-12-15 LAB — SARS CORONAVIRUS 2 (TAT 6-24 HRS): SARS Coronavirus 2: NEGATIVE

## 2020-12-15 NOTE — ED Triage Notes (Signed)
Pt states she does not have sxs.

## 2020-12-15 NOTE — ED Triage Notes (Signed)
Pt is here for a COVID test after being exposed to a family member who is positive.

## 2020-12-21 ENCOUNTER — Ambulatory Visit (HOSPITAL_COMMUNITY)
Admission: EM | Admit: 2020-12-21 | Discharge: 2020-12-21 | Disposition: A | Discharge status: Home / Self Care | Payer: Managed Care, Other (non HMO) | Attending: Family Medicine | Admitting: Family Medicine

## 2020-12-21 ENCOUNTER — Other Ambulatory Visit: Payer: Self-pay

## 2020-12-21 ENCOUNTER — Encounter (HOSPITAL_COMMUNITY): Payer: Self-pay

## 2020-12-21 DIAGNOSIS — U071 COVID-19: Secondary | ICD-10-CM

## 2020-12-21 MED ORDER — NIRMATRELVIR/RITONAVIR (PAXLOVID)TABLET: 3.0000 | ORAL_TABLET | Freq: Two times a day (BID) | ORAL | 0 refills | Status: AC

## 2020-12-21 MED ORDER — IPRATROPIUM BROMIDE 0.03 % NA SOLN: 2.0000 | Freq: Two times a day (BID) | NASAL | 0 refills | Status: DC

## 2020-12-21 NOTE — ED Triage Notes (Signed)
Pt presents with shortness of breath, loss of voice, headache, generalized body aches, and congestion after testing positive for covid this morning.

## 2020-12-21 NOTE — Discharge Instructions (Addendum)
Continue your home [employer].  Start Paxlovid  take as directed for the entire 5-day course. For nasal congestion start Atrovent nasal spray 2 sprays in each nares twice daily as needed.  I would hold off on your Emgality injection until you are done with the course of antiviral

## 2020-12-21 NOTE — ED Provider Notes (Signed)
[place]    CSN: [id number] Arrival date & time: 12/21/20  1114      History   Chief Complaint Chief Complaint  Patient presents with  . Covid +     HPI Sandra Moody is a 50 y.o. female.   HPI  Patient presents today after testing positive for COVID-19 at home after being directly exposed to a family member positive for COVID.  She reports symptoms started yesterday with headache, nasal congestion, generalized body aches, with intermittent shortness of breath.  Patient has a medical history significant of diabetes and asthma.  She has not had a fever to her knowledge.  She is afebrile at present.  Past Medical History:  Diagnosis Date  . Acute renal disease   . Asthma   . Bilateral carpal tunnel syndrome 03/11/2019  . Common migraine with intractable migraine 08/29/2018  . Daily headache    (03/08/2017)  . Essential hypertension 02/20/2019  . GERD (gastroesophageal reflux disease)   . H pylori ulcer ~ 08/2016  . History of hiatal hernia   . Leaky heart valve ~ 09/2016  . Migraine    "maybe weekly" (03/08/2017)  . PONV (postoperative nausea and vomiting)   . Type 2 diabetes mellitus with hyperglycemia, without long-term current use of insulin ([place]) 02/20/2019    Patient Active Problem List   Diagnosis Date Noted  . Entrapment of left ulnar nerve 04/28/2020  . Diabetic polyneuropathy associated with type 2 diabetes mellitus ([place]) 03/05/2020  . Type 2 diabetes mellitus without complication, without long-term current use of insulin ([place]) 09/05/2019  . Trigger finger of left thumb 04/30/2019  . Infection of flexor tendon sheath 04/15/2019  . Carpal tunnel syndrome, bilateral 03/11/2019  . Essential hypertension 02/20/2019  . Type 2 diabetes mellitus with hyperglycemia, without long-term current use of insulin ([place]) 02/20/2019  . Other acute sinusitis 12/25/2018  . Irritable bowel syndrome 12/04/2018  . Migraine with aura 12/04/2018  . Common migraine with  intractable migraine 08/29/2018  . Moderate persistent asthma without complication 43/32/9518  . Seasonal and perennial allergic rhinitis 06/28/2017  . Mild persistent asthma with acute exacerbation 06/28/2017  . Gastroesophageal reflux disease 06/28/2017  . Biliary dyskinesia 03/08/2017  . Asthma 03/02/2016  . Asthma with acute exacerbation 10/04/2015  . Other and unspecified ovarian cyst 06/17/2012    Past Surgical History:  Procedure Laterality Date  . CARPAL TUNNEL RELEASE Left 12/18/2019   Procedure: CARPAL TUNNEL RELEASE;  Surgeon: [name], MD;  Location: [facility];  Service: Orthopedics;  Laterality: Left;  . CARPAL TUNNEL RELEASE Right 07/22/2020   Procedure: CARPAL TUNNEL RELEASE RIGHT;  Surgeon: [name], MD;  Location: [place];  Service: Orthopedics;  Laterality: Right;  . CHOLECYSTECTOMY N/A 03/08/2017   Procedure: LAPAROSCOPIC CHOLECYSTECTOMY;  Surgeon: Coralie Keens, MD;  Location: [facility];  Service: General;  Laterality: N/A;  . DILATION AND CURETTAGE OF UTERUS    . LAPAROSCOPIC CHOLECYSTECTOMY  03/08/2017  . SHOULDER ARTHROSCOPY WITH SUBACROMIAL DECOMPRESSION, ROTATOR CUFF REPAIR AND BICEP TENDON REPAIR Right 07/22/2020   Procedure: SHOULDER ARTHROSCOPY WITH SUBACROMIAL DECOMPRESSION AND DISTAL CLAVICLE EXCISION, ROTATOR CUFF REPAIR AND BICEPS TENODESIS;  Surgeon: Hiram Gash, MD;  Location: [facility];  Service: Orthopedics;  Laterality: Right;  . TRIGGER FINGER RELEASE Left 12/18/2019   Procedure: RELEASE TRIGGER FINGER/A-1 PULLEY;  Surgeon: [name], MD;  Location: [facility];  Service: Orthopedics;  Laterality: Left;  . TUBAL LIGATION  OB History    Gravida  5   Para  3   Term  3   Preterm  0   AB  2   Living        SAB  1   IAB  1   Ectopic  0   Multiple      Live Births               Home Medications    Prior to Admission medications   Medication Sig  Start Date End Date Taking? Authorizing Provider  Accu-Chek FastClix Lancets MISC USE TO CHECK GLUCOSE TWICE A DAY ,FASTING AND 2 HOURS AFTER DINNER 08/30/20   Shamleffer, Melanie Crazier, MD  ACCU-CHEK GUIDE test strip USE TO CHECK GLUCOSE TWICE DAILY,FASTING AND 2 HOURS AFTER DINNER 05/09/20   Jacelyn Pi, Lilia Argue, MD  Accu-Chek Softclix Lancets lancets Use as instructed to check blood sugar 2 times a day 08/27/20   Shamleffer, Melanie Crazier, MD  acetaminophen (TYLENOL) 500 MG tablet Take 2 tablets (1,000 mg total) by mouth every 8 (eight) hours. 07/22/20   McBane, Maylene Roes, PA-C  albuterol (PROVENTIL) (2.5 MG/3ML) 0.083% nebulizer solution Take 3 mLs (2.5 mg total) by nebulization every 6 (six) hours as needed for wheezing or shortness of breath. 11/13/19   Wendie Agreste, MD  ALBUTEROL SULFATE PO Take by mouth.    [provider]  amLODipine (NORVASC) 2.5 MG tablet TAKE 1 TABLET BY MOUTH EVERY DAY 04/27/20   Jacelyn Pi, Lilia Argue, MD  BIOTIN PO Take by mouth.    [provider]  blood glucose meter kit and supplies Per insurance preference. Use to check glucose twice a day (fasting and 2 hours after dinner)  Dx E11.65 10/30/18   Jacelyn Pi, Lilia Argue, MD  Cholecalciferol (VITAMIN D3 PO) Take by mouth.    [provider]  clobetasol ointment (TEMOVATE) 0.05 % APPLY TOPICALLY TWICE A DAY AS NEEDED FOR ITCHY RASH AS NEEDED 06/28/20   Wendie Agreste, MD  dapagliflozin propanediol (FARXIGA) 5 MG TABS tablet Take 1 tablet (5 mg total) by mouth daily before breakfast. Patient not taking: Reported on 08/31/2020 08/23/20   Shamleffer, Melanie Crazier, MD  EMGALITY 120 MG/ML SOAJ INJECT 120 MG INTO THE SKIN EVERY 30 (THIRTY) DAYS. 10/29/20   Suzzanne Cloud, NP  EPIPEN 2-PAK 0.3 MG/0.3ML SOAJ injection Inject 0.3 mg into the muscle as needed (for allergic reaction).  05/18/16   [provider]  fluticasone (FLONASE) 50 MCG/ACT nasal spray USE 1 2 SPRAYS IN EACH NOSTRIL  ONCE A DAY 10/25/18   [provider]  Insulin Glargine-yfgn (SEMGLEE, YFGN,) 100 UNIT/ML SOLN Inject 40 Units into the skin daily at 2 PM. 08/27/20   Shamleffer, Melanie Crazier, MD  Insulin Pen Needle (PEN NEEDLES) 32G X 6 MM MISC Use to inject insulin daily 11/22/20   Shamleffer, Melanie Crazier, MD  ketoconazole (NIZORAL) 2 % cream Apply 1 application topically daily. To seborrhea/face lesion as needed. 06/14/20   Wendie Agreste, MD  ketoconazole (NIZORAL) 2 % shampoo Apply 1 application topically 2 (two) times a week. Apply to affected area lather, leave in place for 5 mins and then rinse off with water 01/15/20   Jacelyn Pi, Lilia Argue, MD  levocetirizine (XYZAL) 5 MG tablet 1 2 TABLET IN THE EVENING ONCE A DAY ORALLY 15 11/05/18   [provider]  medroxyPROGESTERone (DEPO-PROVERA) 150 MG/ML injection  08/15/18   [provider]  methocarbamol (ROBAXIN)  500 MG tablet Take 1 tablet (500 mg total) by mouth every 8 (eight) hours as needed for muscle spasms. Patient not taking: Reported on 11/02/2020 07/07/20   Wendie Agreste, MD  montelukast (SINGULAIR) 10 MG tablet Take 10 mg by mouth at bedtime.    [provider]  olopatadine (PATANOL) 0.1 % ophthalmic solution Place 1 drop into both eyes 2 (two) times daily. 12/25/18   Corum, Rex Kras, MD  ondansetron (ZOFRAN) 4 MG tablet Take 1 tablet (4 mg total) by mouth every 8 (eight) hours as needed for nausea or vomiting. 07/22/20   McBane, Maylene Roes, PA-C  oxyCODONE (ROXICODONE) 5 MG immediate release tablet Take 1-2 tablets every 6 hours as needed for severe pain. Do not take more than 6 tablets in a 24 hour period of time. 07/22/20   McBane, Maylene Roes, PA-C  potassium chloride (KLOR-CON) 10 MEQ tablet TAKE 1 TABLET BY MOUTH EVERY DAY 09/04/20   Wendie Agreste, MD  pregabalin (LYRICA) 100 MG capsule Take 1 capsule (100 mg total) by mouth 3 (three) times daily. 03/05/20   Daleen Squibb, MD  rizatriptan (MAXALT) 10  MG tablet Take 1 tablet (10 mg total) by mouth 3 (three) times daily as needed for migraine. 05/20/20   Suzzanne Cloud, NP  sitaGLIPtin (JANUVIA) 100 MG tablet Take 1 tablet (100 mg total) by mouth daily. 04/21/20   Shamleffer, Melanie Crazier, MD  Sod Picosulfate-Mag Ox-Cit A[facility]) 10-3.5-12 MG-GM -GM/160ML SOLN As directed ( patient has instructions) 11/20/20   Willia Craze, NP  SYMBICORT 160-4.5 MCG/ACT inhaler INHALE 2 PUFFS INTO THE LUNGS DAILY. 05/09/18   Valentina Shaggy, MD  triamcinolone (KENALOG) 0.1 % Apply 1 application topically 2 (two) times daily. To affected areas on face, scalp as needed. 06/14/20   Wendie Agreste, MD  VITAMIN E PO Take by mouth.    [provider]    Family History Family History  Problem Relation Age of Onset  . Thyroid disease Mother   . Diabetes Maternal Grandmother   . Heart disease Maternal Grandfather     Social History Social History   Tobacco Use  . Smoking status: Never Smoker  . Smokeless tobacco: Never Used  Vaping Use  . Vaping Use: Never used  Substance Use Topics  . Alcohol use: Not Currently    Comment: 03/08/2017 "might have a drink on a holiday"  . Drug use: No     Allergies   Ginger, Passion fruit flavor [flavoring agent], Shellfish allergy, Other, and Tamiflu [oseltamivir phosphate]   Review of Systems Review of Systems Pertinent negatives listed in HPI  Physical Exam Triage Vital Signs ED Triage Vitals  Enc Vitals Group     BP 12/21/20 1329 138/87     Pulse Rate 12/21/20 1329 85     Resp 12/21/20 1329 17     Temp 12/21/20 1329 97.8 F (36.6 C)     Temp Source 12/21/20 1329 Oral     SpO2 12/21/20 1329 100 %     Weight --      Height --      Head Circumference --      Peak Flow --      Pain Score 12/21/20 1328 5     Pain Loc --      Pain Edu? --      Excl. in [place]? --    No data found.  Updated Vital Signs BP 138/87 (BP Location: Left Arm)  Pulse 85   Temp 97.8 F (36.6 C)  (Oral)   Resp 17   SpO2 100%   Visual Acuity Right Eye Distance:   Left Eye Distance:   Bilateral Distance:    Right Eye Near:   Left Eye Near:    Bilateral Near:     Physical Exam  General Appearance:    Alert, non toxic appearing, cooperative, no distress  HENT:   Normocephalic, ears normal, nares mucosal edema with congestion, rhinorrhea, oropharynx    Eyes:    PERRL, conjunctiva/corneas clear, EOM's intact       Lungs:     Clear to auscultation bilaterally, respirations unlabored  Heart:    Regular rate and rhythm  Neurologic:   Awake, alert, oriented x 3. No apparent focal neurological           defect.    UC Treatments / Results  Labs (all labs ordered are listed, but only abnormal results are displayed) Labs Reviewed - No data to display  EKG   Radiology No results found.  Procedures Procedures (including critical care time)  Medications Ordered in UC Medications - No data to display  Initial Impression / Assessment and Plan / UC Course  I have reviewed the triage vital signs and the nursing notes.  Pertinent labs & imaging results that were available during my care of the patient were reviewed by me and considered in my medical decision making (see chart for details).   Patient positive for COVID-19, she will continue home Tessalon Perles and asthma regimen.  Paxlovid prescribed as antiviral therapy to shorten the course of the virus.  Continue supportive management of symptoms.  Atrovent nasal spray prescribed for nasal congestion.  Alternate Tylenol and ibuprofen for body aches.  ER precaution if red flag symptoms develop.  Final Clinical Impressions(s) / UC Diagnoses   Final diagnoses:  COVID-19 virus infection     Discharge Instructions     Continue your home Tessalon Perles.  Start Paxlovid  take as directed for the entire 5-day course. For nasal congestion start Atrovent nasal spray 2 sprays in each nares twice daily as needed.  I would hold off  on your Emgality injection until you are done with the course of antiviral     ED Prescriptions    Medication Sig Dispense Auth. Provider   ipratropium (ATROVENT) 0.03 % nasal spray Place 2 sprays into both nostrils 2 (two) times daily. 30 mL Scot Jun, FNP   nirmatrelvir/ritonavir EUA (PAXLOVID) TABS Take 3 tablets by mouth 2 (two) times daily for 5 days. Patient GFR is 76 Take nirmatrelvir (150 mg) two tablets twice daily for 5 days and ritonavir (100 mg) one tablet twice daily for 5 days. 30 tablet Scot Jun, FNP     PDMP not reviewed this encounter.   Scot Jun, FNP 12/21/20 1410

## 2020-12-23 ENCOUNTER — Ambulatory Visit: Payer: Managed Care, Other (non HMO) | Admitting: Internal Medicine

## 2020-12-31 ENCOUNTER — Encounter (HOSPITAL_COMMUNITY): Payer: Self-pay | Admitting: Emergency Medicine

## 2020-12-31 ENCOUNTER — Telehealth: Payer: Self-pay | Admitting: Critical Care Medicine

## 2020-12-31 ENCOUNTER — Other Ambulatory Visit: Payer: Self-pay

## 2020-12-31 ENCOUNTER — Ambulatory Visit (HOSPITAL_COMMUNITY)
Admission: EM | Admit: 2020-12-31 | Discharge: 2020-12-31 | Disposition: A | Discharge status: Home / Self Care | Payer: Managed Care, Other (non HMO) | Attending: Internal Medicine | Admitting: Internal Medicine

## 2020-12-31 ENCOUNTER — Ambulatory Visit (INDEPENDENT_AMBULATORY_CARE_PROVIDER_SITE_OTHER): Payer: Managed Care, Other (non HMO)

## 2020-12-31 DIAGNOSIS — R0602 Shortness of breath: Secondary | ICD-10-CM

## 2020-12-31 MED ORDER — PREDNISONE 20 MG PO TABS: 20.0000 mg | ORAL_TABLET | Freq: Every day | ORAL | 0 refills | Status: AC

## 2020-12-31 NOTE — Discharge Instructions (Signed)
Patient completed a course of steroids Increase use of her bronchodilator treatments If symptoms worsen please return to the urgent care Your chest x-ray was negative for any lung infiltrate.

## 2020-12-31 NOTE — Telephone Encounter (Signed)
Called and spoke with Patient.  Patient stated she was tested 12/21/20, with home covid test.  Patient stated she was covid positive and went to Urgent care. Patient stated she is still having cough.  Patient stated she feels itching, burning in her chest when she coughs.  Patient requested to be seen in office today.  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  Offered OV with Beth, NP, but Patient declined and stated she would go to Urgent Care to be check.  Advised to call office to schedule OV, if needed.

## 2020-12-31 NOTE — ED Provider Notes (Signed)
[place]    CSN: [id number] Arrival date & time: 12/31/20  0934      History   Chief Complaint Chief Complaint  Patient presents with  . Shortness of Breath  . Cough    HPI Sandra Moody is a 50 y.o. female comes to the urgent care with worsening shortness of breath, chest tightness and chest pain.  Patient was diagnosed with COVID-19 infection on 5/24.  Patient has completed a course of past COVID with initial improvement in his symptoms.  Patient is experiencing worsening shortness of breath, cough and chest pain over the past few days.  No fever or chills.  No sputum production.  Patient has been using albuterol inhaler and Symbicort.Marland Kitchen   HPI  Past Medical History:  Diagnosis Date  . Acute renal disease   . Asthma   . Bilateral carpal tunnel syndrome 03/11/2019  . Common migraine with intractable migraine 08/29/2018  . Daily headache    (03/08/2017)  . Essential hypertension 02/20/2019  . GERD (gastroesophageal reflux disease)   . H pylori ulcer ~ 08/2016  . History of hiatal hernia   . Leaky heart valve ~ 09/2016  . Migraine    "maybe weekly" (03/08/2017)  . PONV (postoperative nausea and vomiting)   . Type 2 diabetes mellitus with hyperglycemia, without long-term current use of insulin ([place]) 02/20/2019    Patient Active Problem List   Diagnosis Date Noted  . Entrapment of left ulnar nerve 04/28/2020  . Diabetic polyneuropathy associated with type 2 diabetes mellitus ([place]) 03/05/2020  . Type 2 diabetes mellitus without complication, without long-term current use of insulin ([place]) 09/05/2019  . Trigger finger of left thumb 04/30/2019  . Infection of flexor tendon sheath 04/15/2019  . Carpal tunnel syndrome, bilateral 03/11/2019  . Essential hypertension 02/20/2019  . Type 2 diabetes mellitus with hyperglycemia, without long-term current use of insulin ([place]) 02/20/2019  . Other acute sinusitis 12/25/2018  . Irritable bowel syndrome 12/04/2018  . Migraine with  aura 12/04/2018  . Common migraine with intractable migraine 08/29/2018  . Moderate persistent asthma without complication [id number]/6734  . Seasonal and perennial allergic rhinitis 06/28/2017  . Mild persistent asthma with acute exacerbation 06/28/2017  . Gastroesophageal reflux disease 06/28/2017  . Biliary dyskinesia 03/08/2017  . Asthma 03/02/2016  . Asthma with acute exacerbation 10/04/2015  . Other and unspecified ovarian cyst 06/17/2012    Past Surgical History:  Procedure Laterality Date  . CARPAL TUNNEL RELEASE Left 12/18/2019   Procedure: CARPAL TUNNEL RELEASE;  Surgeon: [name], MD;  Location: [facility];  Service: Orthopedics;  Laterality: Left;  . CARPAL TUNNEL RELEASE Right 07/22/2020   Procedure: CARPAL TUNNEL RELEASE RIGHT;  Surgeon: [name], MD;  Location: [place];  Service: Orthopedics;  Laterality: Right;  . CHOLECYSTECTOMY N/A 03/08/2017   Procedure: LAPAROSCOPIC CHOLECYSTECTOMY;  Surgeon: Coralie Keens, MD;  Location: [place];  Service: General;  Laterality: N/A;  . DILATION AND CURETTAGE OF UTERUS    . LAPAROSCOPIC CHOLECYSTECTOMY  03/08/2017  . SHOULDER ARTHROSCOPY WITH SUBACROMIAL DECOMPRESSION, ROTATOR CUFF REPAIR AND BICEP TENDON REPAIR Right 07/22/2020   Procedure: SHOULDER ARTHROSCOPY WITH SUBACROMIAL DECOMPRESSION AND DISTAL CLAVICLE EXCISION, ROTATOR CUFF REPAIR AND BICEPS TENODESIS;  Surgeon: Hiram Gash, MD;  Location: [facility];  Service: Orthopedics;  Laterality: Right;  . TRIGGER FINGER RELEASE Left 12/18/2019   Procedure: RELEASE TRIGGER FINGER/A-1 PULLEY;  Surgeon: [name], MD;  Location: [facility];  Service: Orthopedics;  Laterality: Left;  . TUBAL LIGATION      OB History    Gravida  5   Para  3   Term  3   Preterm  0   AB  2   Living        SAB  1   IAB  1   Ectopic  0   Multiple      Live Births               Home Medications    Prior to  Admission medications   Medication Sig Start Date End Date Taking? Authorizing Provider  predniSONE (DELTASONE) 20 MG tablet Take 1 tablet (20 mg total) by mouth daily for 5 days. 12/31/20 01/05/21 Yes Maelynn Moroney, Myrene Galas, MD  Accu-Chek FastClix Lancets MISC USE TO CHECK GLUCOSE TWICE A DAY ,FASTING AND 2 HOURS AFTER DINNER 08/30/20   Shamleffer, Melanie Crazier, MD  ACCU-CHEK GUIDE test strip USE TO CHECK GLUCOSE TWICE DAILY,FASTING AND 2 HOURS AFTER DINNER 05/09/20   Jacelyn Pi, Lilia Argue, MD  Accu-Chek Softclix Lancets lancets Use as instructed to check blood sugar 2 times a day 08/27/20   Shamleffer, Melanie Crazier, MD  acetaminophen (TYLENOL) 500 MG tablet Take 2 tablets (1,000 mg total) by mouth every 8 (eight) hours. 07/22/20   McBane, Maylene Roes, PA-C  albuterol (PROVENTIL) (2.5 MG/3ML) 0.083% nebulizer solution Take 3 mLs (2.5 mg total) by nebulization every 6 (six) hours as needed for wheezing or shortness of breath. 11/13/19   Wendie Agreste, MD  ALBUTEROL SULFATE PO Take by mouth.    [provider]  amLODipine (NORVASC) 2.5 MG tablet TAKE 1 TABLET BY MOUTH EVERY DAY 04/27/20   Jacelyn Pi, Lilia Argue, MD  BIOTIN PO Take by mouth.    [provider]  blood glucose meter kit and supplies Per insurance preference. Use to check glucose twice a day (fasting and 2 hours after dinner)  Dx E11.65 10/30/18   Jacelyn Pi, Lilia Argue, MD  Cholecalciferol (VITAMIN D3 PO) Take by mouth.    [provider]  clobetasol ointment (TEMOVATE) 0.05 % APPLY TOPICALLY TWICE A DAY AS NEEDED FOR ITCHY RASH AS NEEDED 06/28/20   Wendie Agreste, MD  EMGALITY 120 MG/ML SOAJ INJECT 120 MG INTO THE SKIN EVERY 30 (THIRTY) DAYS. 10/29/20   Suzzanne Cloud, NP  EPIPEN 2-PAK 0.3 MG/0.3ML SOAJ injection Inject 0.3 mg into the muscle as needed (for allergic reaction).  05/18/16   [provider]  fluticasone (FLONASE) 50 MCG/ACT nasal spray USE 1 2 SPRAYS IN EACH NOSTRIL ONCE A DAY 10/25/18    [provider]  Insulin Glargine-yfgn (SEMGLEE, YFGN,) 100 UNIT/ML SOLN Inject 40 Units into the skin daily at 2 PM. 08/27/20   Shamleffer, Melanie Crazier, MD  Insulin Pen Needle (PEN NEEDLES) 32G X 6 MM MISC Use to inject insulin daily 11/22/20   Shamleffer, Melanie Crazier, MD  ipratropium (ATROVENT) 0.03 % nasal spray Place 2 sprays into both nostrils 2 (two) times daily. 12/21/20   Scot Jun, FNP  ketoconazole (NIZORAL) 2 % cream Apply 1 application topically daily. To seborrhea/face lesion as needed. 06/14/20   Wendie Agreste, MD  ketoconazole (NIZORAL) 2 % shampoo Apply 1 application topically 2 (two) times a week. Apply to affected area lather, leave in place for 5 mins and then rinse off with water 01/15/20   Jacelyn Pi, Lilia Argue, MD  levocetirizine (XYZAL) 5 MG tablet 1 2 TABLET IN THE  EVENING ONCE A DAY ORALLY 15 11/05/18   [provider]  medroxyPROGESTERone (DEPO-PROVERA) 150 MG/ML injection  08/15/18   [provider]  methocarbamol (ROBAXIN) 500 MG tablet Take 1 tablet (500 mg total) by mouth every 8 (eight) hours as needed for muscle spasms. Patient not taking: Reported on 11/02/2020 07/07/20   Wendie Agreste, MD  montelukast (SINGULAIR) 10 MG tablet Take 10 mg by mouth at bedtime.    [provider]  olopatadine (PATANOL) 0.1 % ophthalmic solution Place 1 drop into both eyes 2 (two) times daily. 12/25/18   Corum, Rex Kras, MD  ondansetron (ZOFRAN) 4 MG tablet Take 1 tablet (4 mg total) by mouth every 8 (eight) hours as needed for nausea or vomiting. 07/22/20   McBane, Maylene Roes, PA-C  oxyCODONE (ROXICODONE) 5 MG immediate release tablet Take 1-2 tablets every 6 hours as needed for severe pain. Do not take more than 6 tablets in a 24 hour period of time. 07/22/20   McBane, Maylene Roes, PA-C  potassium chloride (KLOR-CON) 10 MEQ tablet TAKE 1 TABLET BY MOUTH EVERY DAY 09/04/20   Wendie Agreste, MD  pregabalin (LYRICA) 100 MG capsule Take 1  capsule (100 mg total) by mouth 3 (three) times daily. 03/05/20   Daleen Squibb, MD  rizatriptan (MAXALT) 10 MG tablet Take 1 tablet (10 mg total) by mouth 3 (three) times daily as needed for migraine. 05/20/20   Suzzanne Cloud, NP  sitaGLIPtin (JANUVIA) 100 MG tablet Take 1 tablet (100 mg total) by mouth daily. 04/21/20   Shamleffer, Melanie Crazier, MD  Sod Picosulfate-Mag Ox-Cit [facility]) 10-3.5-12 MG-GM -GM/160ML SOLN As directed ( patient has instructions) 11/20/20   Willia Craze, NP  SYMBICORT 160-4.5 MCG/ACT inhaler INHALE 2 PUFFS INTO THE LUNGS DAILY. 05/09/18   Valentina Shaggy, MD  triamcinolone (KENALOG) 0.1 % Apply 1 application topically 2 (two) times daily. To affected areas on face, scalp as needed. 06/14/20   Wendie Agreste, MD  VITAMIN E PO Take by mouth.    [provider]    Family History Family History  Problem Relation Age of Onset  . Thyroid disease Mother   . Diabetes Maternal Grandmother   . Heart disease Maternal Grandfather     Social History Social History   Tobacco Use  . Smoking status: Never Smoker  . Smokeless tobacco: Never Used  Vaping Use  . Vaping Use: Never used  Substance Use Topics  . Alcohol use: Not Currently    Comment: 03/08/2017 "might have a drink on a holiday"  . Drug use: No     Allergies   Ginger, Passion fruit flavor [flavoring agent], Shellfish allergy, Other, and Tamiflu [oseltamivir phosphate]   Review of Systems Review of Systems  HENT: Negative.  Negative for sore throat.   Eyes: Negative.   Respiratory: Positive for cough, chest tightness, shortness of breath and wheezing.   Cardiovascular: Positive for chest pain.  Musculoskeletal: Positive for back pain. Negative for myalgias.  Neurological: Negative.      Physical Exam Triage Vital Signs ED Triage Vitals  Enc Vitals Group     BP 12/31/20 1022 (!) 156/87     Pulse Rate 12/31/20 1022 79     Resp 12/31/20 1022 16     Temp 12/31/20  1022 99.8 F (37.7 C)     Temp src --      SpO2 12/31/20 1022 100 %     Weight --  Height --      Head Circumference --      Peak Flow --      Pain Score 12/31/20 1023 6     Pain Loc --      Pain Edu? --      Excl. in [place]? --    No data found.  Updated Vital Signs BP (!) 156/87   Pulse 79   Temp 99.8 F (37.7 C)   Resp 16   SpO2 100%   Visual Acuity Right Eye Distance:   Left Eye Distance:   Bilateral Distance:    Right Eye Near:   Left Eye Near:    Bilateral Near:     Physical Exam Vitals and nursing note reviewed.  Constitutional:      General: She is not in acute distress.    Appearance: She is not ill-appearing.  Cardiovascular:     Rate and Rhythm: Normal rate and regular rhythm.  Pulmonary:     Effort: Pulmonary effort is normal.     Breath sounds: Normal breath sounds. No decreased breath sounds, wheezing, rhonchi or rales.  Neurological:     Mental Status: She is alert.      UC Treatments / Results  Labs (all labs ordered are listed, but only abnormal results are displayed) Labs Reviewed - No data to display  EKG   Radiology DG Chest 2 View  Result Date: 12/31/2020 CLINICAL DATA:  Shortness of breath. EXAM: CHEST - 2 VIEW COMPARISON:  May 23, 2019. FINDINGS: The heart size and mediastinal contours are within normal limits. Both lungs are clear. No visible pleural effusions or pneumothorax. No acute osseous abnormality. IMPRESSION: No active cardiopulmonary disease. Electronically Signed   By: Margaretha Sheffield MD   On: 12/31/2020 11:56    Procedures Procedures (including critical care time)  Medications Ordered in UC Medications - No data to display  Initial Impression / Assessment and Plan / UC Course  I have reviewed the triage vital signs and the nursing notes.  Pertinent labs & imaging results that were available during my care of the patient were reviewed by me and considered in my medical decision making (see chart for  details).     1.  Acute bronchitis with bronchospasm: Continue using albuterol inhaler and Symbicort Albuterol nebulizer use is recommended Short course of prednisone Patient symptoms initially improved and seem to have rebounded- "?Paxlovid rebound phenomenon" Final Clinical Impressions(s) / UC Diagnoses   Final diagnoses:  Shortness of breath     Discharge Instructions     Patient completed a course of steroids Increase use of her bronchodilator treatments If symptoms worsen please return to the urgent care Your chest x-ray was negative for any lung infiltrate.   ED Prescriptions    Medication Sig Dispense Auth. Provider   predniSONE (DELTASONE) 20 MG tablet Take 1 tablet (20 mg total) by mouth daily for 5 days. 5 tablet Eowyn Tabone, Myrene Galas, MD     PDMP not reviewed this encounter.   Chase Picket, MD 12/31/20 [phone]

## 2020-12-31 NOTE — ED Triage Notes (Signed)
PT was covid positive 5/24. Reports history of asthma. PT reports shortness of breath, cough, and chest and back discomfort when coughing. Pulmonologist recommended she come here for chest xray.

## 2021-01-12 ENCOUNTER — Other Ambulatory Visit: Payer: Self-pay | Admitting: Family Medicine

## 2021-01-18 ENCOUNTER — Ambulatory Visit (INDEPENDENT_AMBULATORY_CARE_PROVIDER_SITE_OTHER): Payer: Managed Care, Other (non HMO) | Admitting: Neurology

## 2021-01-18 ENCOUNTER — Encounter: Payer: Self-pay | Admitting: Neurology

## 2021-01-18 VITALS — BP 128/81 | HR 89 | Ht 64.0 in | Wt 158.4 lb

## 2021-01-18 DIAGNOSIS — G43009 Migraine without aura, not intractable, without status migrainosus: Secondary | ICD-10-CM

## 2021-01-18 MED ORDER — EMGALITY 120 MG/ML ~~LOC~~ SOAJ: 120.0000 mg | SUBCUTANEOUS | 11 refills | Status: DC

## 2021-01-18 MED ORDER — RIZATRIPTAN BENZOATE 10 MG PO TABS: 10.0000 mg | ORAL_TABLET | Freq: Three times a day (TID) | ORAL | 11 refills | Status: DC | PRN

## 2021-01-18 NOTE — Progress Notes (Signed)
PATIENT: Sandra Moody DOB: September 20, 1970  REASON FOR VISIT: follow up HISTORY FROM: patient  HISTORY OF PRESENT ILLNESS: Today 01/18/21 Sandra Moody is a 50 year old female here today to follow-up on her migraine headaches. Had COVID few weeks ago, got hurt at work in Dec, had rotator cuff surgery on the right. Isn't going back to work. Isn't improving as quickly as expected, still in PT.  Remains on Emgality, on average 3 migraines a month.  Good benefit with Maxalt. Usually has tablets left each month.  Happy with current migraine control.  Update 05/20/2020 SS: Sandra Moody is a 50 year old female history of migraine headache.  Has previously been on Tofranil, but stopped after she felt it was interfering with her diabetes.  Previously tried Topamax.  In March was placed on Emgality.  Has been on now for about 3 to 4 months, has really helped her headaches, more than 80%.  In the last several months, has not had any severe migraine.  Has had a few mild headaches, has taken Maxalt twice.  She has been dealing with some orthopedic issues, had sciatica, frozen shoulder on the right, ear infection, is also on insulin for diabetes.  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  Seems to be tolerating the Emgality injections well, does have some burning initially.  Is on Lyrica for foot pain.  Overall, headaches are much improved.  Presents today for evaluation unaccompanied.  Planning to have right carpal tunnel surgery with Dr. Fredna Dow.  HISTORY 10/21/2019 SS: Sandra Moody is a 50 year old female with history of migraine headache.  Since last seen, she stopped taking Tofranil, was afraid it could be affecting her diabetes, was tired of taking multiple medications.  Since off the medicine, has had at least 2 migraines a week, with nausea.  Will take Maxalt with good benefit.  Her diabetes is now under control.  She is seeing a foot doctor, is prescribed Lyrica.  She says her headaches  worsen during allergy season.  She has asthma.  She says she has previously been on Topamax.  She has not missing work, but says she has to go to work, she works as a Advertising account executive.  She has seen Dr. Fredna Dow, for thumb issues, says is doing well.  She presents today for evaluation unaccompanied.    REVIEW OF SYSTEMS: Out of a complete 14 system review of symptoms, the patient complains only of the following symptoms, and all other reviewed systems are negative.  Headache  ALLERGIES: Allergies  Allergen Reactions   Ginger Itching and Swelling   Passion Fruit Flavor [Flavoring Agent] Itching and Swelling   Shellfish Allergy Anaphylaxis   Other Other (See Comments)    ALLERGIC TO ALL TREE NUTS ALLERGIC TO ALL TREE NUTS   Tamiflu [Oseltamivir Phosphate]     HOME MEDICATIONS: Outpatient Medications Prior to Visit  Medication Sig Dispense Refill   Accu-Chek FastClix Lancets MISC USE TO CHECK GLUCOSE TWICE A DAY ,FASTING AND 2 HOURS AFTER DINNER 102 each 5   ACCU-CHEK GUIDE test strip USE TO CHECK GLUCOSE TWICE DAILY,FASTING AND 2 HOURS AFTER DINNER 100 strip 5   Accu-Chek Softclix Lancets lancets Use as instructed to check blood sugar 2 times a day 100 each 5   albuterol (PROVENTIL) (2.5 MG/3ML) 0.083% nebulizer solution Take 3 mLs (2.5 mg total) by nebulization every 6 (six) hours as needed for wheezing or shortness of breath. 75 mL 1   ALBUTEROL SULFATE PO Take by mouth.  BIOTIN PO Take by mouth.     blood glucose meter kit and supplies Per insurance preference. Use to check glucose twice a day (fasting and 2 hours after dinner)  Dx E11.65 1 each 5   clobetasol ointment (TEMOVATE) 0.05 % APPLY TOPICALLY TWICE A DAY AS NEEDED FOR ITCHY RASH AS NEEDED 30 g 0   EMGALITY 120 MG/ML SOAJ INJECT 120 MG INTO THE SKIN EVERY 30 (THIRTY) DAYS. 1 mL 5   EPIPEN 2-PAK 0.3 MG/0.3ML SOAJ injection Inject 0.3 mg into the muscle as needed (for allergic reaction).   2   fluticasone (FLONASE) 50  MCG/ACT nasal spray USE 1 2 SPRAYS IN EACH NOSTRIL ONCE A DAY     Insulin Glargine-yfgn (SEMGLEE, YFGN,) 100 UNIT/ML SOLN Inject 40 Units into the skin daily at 2 PM. 30 mL 3   Insulin Pen Needle (PEN NEEDLES) 32G X 6 MM MISC Use to inject insulin daily 100 each 2   ketoconazole (NIZORAL) 2 % cream Apply 1 application topically daily. To seborrhea/face lesion as needed. 15 g 0   ketoconazole (NIZORAL) 2 % shampoo Apply 1 application topically 2 (two) times a week. Apply to affected area lather, leave in place for 5 mins and then rinse off with water 120 mL 5   levocetirizine (XYZAL) 5 MG tablet 1 2 TABLET IN THE EVENING ONCE A DAY ORALLY 15     medroxyPROGESTERone (DEPO-PROVERA) 150 MG/ML injection      olopatadine (PATANOL) 0.1 % ophthalmic solution Place 1 drop into both eyes 2 (two) times daily. 5 mL 12   potassium chloride (KLOR-CON) 10 MEQ tablet TAKE 1 TABLET BY MOUTH EVERY DAY 90 tablet 1   rizatriptan (MAXALT) 10 MG tablet Take 1 tablet (10 mg total) by mouth 3 (three) times daily as needed for migraine. 6 tablet 6   sitaGLIPtin (JANUVIA) 100 MG tablet Take 1 tablet (100 mg total) by mouth daily. 90 tablet 3   SYMBICORT 160-4.5 MCG/ACT inhaler INHALE 2 PUFFS INTO THE LUNGS DAILY. 10.2 Inhaler 0   triamcinolone (KENALOG) 0.1 % Apply 1 application topically 2 (two) times daily. To affected areas on face, scalp as needed. 30 g 0   VITAMIN E PO Take by mouth.     acetaminophen (TYLENOL) 500 MG tablet Take 2 tablets (1,000 mg total) by mouth every 8 (eight) hours. 100 tablet 0   amLODipine (NORVASC) 2.5 MG tablet TAKE 1 TABLET BY MOUTH EVERY DAY 90 tablet 1   Cholecalciferol (VITAMIN D3 PO) Take by mouth.     ipratropium (ATROVENT) 0.03 % nasal spray PLACE 2 SPRAYS INTO BOTH NOSTRILS 2 (TWO) TIMES DAILY 30 mL 0   methocarbamol (ROBAXIN) 500 MG tablet Take 1 tablet (500 mg total) by mouth every 8 (eight) hours as needed for muscle spasms. (Patient not taking: No sig reported) 60 tablet 0    montelukast (SINGULAIR) 10 MG tablet Take 10 mg by mouth at bedtime.     ondansetron (ZOFRAN) 4 MG tablet Take 1 tablet (4 mg total) by mouth every 8 (eight) hours as needed for nausea or vomiting. 10 tablet 0   oxyCODONE (ROXICODONE) 5 MG immediate release tablet Take 1-2 tablets every 6 hours as needed for severe pain. Do not take more than 6 tablets in a 24 hour period of time. 24 tablet 0   pregabalin (LYRICA) 100 MG capsule Take 1 capsule (100 mg total) by mouth 3 (three) times daily. 90 capsule 3   Sod Picosulfate-Mag Ox-Cit Acd (CLENPIQ) 10-3.5-12  MG-GM -GM/160ML SOLN As directed ( patient has instructions) 320 mL 0   No facility-administered medications prior to visit.    PAST MEDICAL HISTORY: Past Medical History:  Diagnosis Date   Acute renal disease    Asthma    Bilateral carpal tunnel syndrome 03/11/2019   Common migraine with intractable migraine 08/29/2018   Daily headache    (03/08/2017)   Essential hypertension 02/20/2019   GERD (gastroesophageal reflux disease)    H pylori ulcer ~ 08/2016   History of hiatal hernia    Leaky heart valve ~ 09/2016   Migraine    "maybe weekly" (03/08/2017)   PONV (postoperative nausea and vomiting)    Type 2 diabetes mellitus with hyperglycemia, without long-term current use of insulin ([place]) 02/20/2019    PAST SURGICAL HISTORY: Past Surgical History:  Procedure Laterality Date   CARPAL TUNNEL RELEASE Left 12/18/2019   Procedure: CARPAL TUNNEL RELEASE;  Surgeon: [name], MD;  Location: [facility];  Service: Orthopedics;  Laterality: Left;   CARPAL TUNNEL RELEASE Right 07/22/2020   Procedure: CARPAL TUNNEL RELEASE RIGHT;  Surgeon: [name], MD;  Location: [facility];  Service: Orthopedics;  Laterality: Right;   CHOLECYSTECTOMY N/A 03/08/2017   Procedure: LAPAROSCOPIC CHOLECYSTECTOMY;  Surgeon: Coralie Keens, MD;  Location: [facility];  Service: General;  Laterality: N/A;   DILATION AND CURETTAGE OF UTERUS      LAPAROSCOPIC CHOLECYSTECTOMY  03/08/2017   SHOULDER ARTHROSCOPY WITH SUBACROMIAL DECOMPRESSION, ROTATOR CUFF REPAIR AND BICEP TENDON REPAIR Right 07/22/2020   Procedure: SHOULDER ARTHROSCOPY WITH SUBACROMIAL DECOMPRESSION AND DISTAL CLAVICLE EXCISION, ROTATOR CUFF REPAIR AND BICEPS TENODESIS;  Surgeon: Hiram Gash, MD;  Location: [facility];  Service: Orthopedics;  Laterality: Right;   TRIGGER FINGER RELEASE Left 12/18/2019   Procedure: RELEASE TRIGGER FINGER/A-1 PULLEY;  Surgeon: [name], MD;  Location: [facility];  Service: Orthopedics;  Laterality: Left;   TUBAL LIGATION      FAMILY HISTORY: Family History  Problem Relation Age of Onset   Thyroid disease Mother    Diabetes Maternal Grandmother    Heart disease Maternal Grandfather     SOCIAL HISTORY: Social History   Socioeconomic History   Marital status: Significant Other    Spouse name: Not on file   Number of children: 3   Years of education: Not on file   Highest education level: 12th grade  Occupational History    Comment: [employer]   Tobacco Use   Smoking status: Never   Smokeless tobacco: Never  Vaping Use   Vaping Use: Never used  Substance and Sexual Activity   Alcohol use: Not Currently    Comment: 03/08/2017 "might have a drink on a holiday"   Drug use: No   Sexual activity: Yes    Partners: Male    Birth control/protection: Injection  Other Topics Concern   Not on file  Social History Narrative   ** Merged History Encounter **   Right handed    Lives at home with significant other 2 cups daily of caffeine    Social Determinants of Health   Financial Resource Strain: Not on file  Food Insecurity: Not on file  Transportation Needs: Not on file  Physical Activity: Not on file  Stress: Not on file  Social Connections: Not on file  Intimate Partner Violence: Not on file   PHYSICAL EXAM  Vitals:   01/18/21 0725  BP: 128/81  Pulse: 89  Weight: 158 lb 6.4  oz (71.8 kg)  Height: 5' 4"  (1.626 m)    Body mass index is 27.19 kg/m.  Generalized: Well developed, in no acute distress   Neurological examination  Mentation: Alert oriented to time, place, history taking. Follows all commands speech and language fluent Cranial nerve II-XII: Pupils were equal round reactive to light. Extraocular movements were full, visual field were full on confrontational test. Facial sensation and strength were normal.  Head turning and shoulder shrug  were normal and symmetric. Motor: The motor testing reveals 5 over 5 strength of all 4 extremities. Good symmetric motor tone is noted throughout.  Sensory: Sensory testing is intact to soft touch on all 4 extremities. No evidence of extinction is noted.  Coordination: Cerebellar testing reveals good finger-nose-finger and heel-to-shin bilaterally.  Gait and station: Gait is normal.  Reflexes: Deep tendon reflexes are symmetric and normal bilaterally.   DIAGNOSTIC DATA (LABS, IMAGING, TESTING) - I reviewed patient records, labs, notes, testing and imaging myself where available.  Lab Results  Component Value Date   WBC 7.7 08/31/2020   HGB 13.2 08/31/2020   HCT 39.7 08/31/2020   MCV 91 08/31/2020   PLT 377 08/31/2020      Component Value Date/Time   NA 140 08/31/2020 1207   K 3.9 08/31/2020 1207   CL 106 08/31/2020 1207   CO2 20 08/31/2020 1207   GLUCOSE 86 08/31/2020 1207   GLUCOSE 117 (H) 07/19/2020 0828   BUN 6 08/31/2020 1207   CREATININE 1.02 (H) 08/31/2020 1207   CALCIUM 9.8 08/31/2020 1207   PROT 6.6 03/05/2020 1108   ALBUMIN 4.3 03/05/2020 1108   AST 23 03/05/2020 1108   ALT 23 03/05/2020 1108   ALKPHOS 80 03/05/2020 1108   BILITOT 0.2 03/05/2020 1108   GFRNONAA 66 08/31/2020 1207   GFRNONAA >60 07/19/2020 0828   GFRAA 76 08/31/2020 1207   Lab Results  Component Value Date   CHOL 124 08/07/2019   HDL 35 (L) 08/07/2019   LDLCALC 72 08/07/2019   TRIG 86 08/07/2019   CHOLHDL 3.5  08/07/2019   Lab Results  Component Value Date   HGBA1C 7.7 (A) 08/23/2020   Lab Results  Component Value Date   [id number] 182 05/23/2019   Lab Results  Component Value Date   TSH 1.590 06/01/2020   ASSESSMENT AND PLAN 50 y.o. year old female  has a past medical history of Acute renal disease, Asthma, Bilateral carpal tunnel syndrome (03/11/2019), Common migraine with intractable migraine (08/29/2018), Daily headache, Essential hypertension (02/20/2019), GERD (gastroesophageal reflux disease), H pylori ulcer (~ 08/2016), History of hiatal hernia, Leaky heart valve (~ 09/2016), Migraine, PONV (postoperative nausea and vomiting), and Type 2 diabetes mellitus with hyperglycemia, without long-term current use of insulin ([place]) (02/20/2019). here with:  1.  Chronic migraine headache 2.  Diabetes  -Migraines currently well controlled -Continue Emgality 120 mg monthly injection for migraine prevention -Continue Maxalt as needed for acute headache -Could not tolerate Tofranil, Topamax -Follow-up in 1 year or sooner if needed  Evangeline Dakin, DNP 01/18/2021, 7:33 AM [facility] [address] [place], [place] [address] [phone]

## 2021-01-18 NOTE — Patient Instructions (Signed)
Continue current migraine medications See you back in 1 year

## 2021-01-18 NOTE — Progress Notes (Signed)
I have read the note, and I agree with the clinical assessment and plan.  Clark Cuff K Kourosh Jablonsky   

## 2021-02-01 ENCOUNTER — Ambulatory Visit: Payer: Managed Care, Other (non HMO) | Admitting: Internal Medicine

## 2021-02-24 ENCOUNTER — Other Ambulatory Visit: Payer: Self-pay | Admitting: Family Medicine

## 2021-02-24 DIAGNOSIS — E876 Hypokalemia: Secondary | ICD-10-CM

## 2021-03-11 ENCOUNTER — Encounter (HOSPITAL_COMMUNITY): Payer: Self-pay | Admitting: Emergency Medicine

## 2021-03-11 ENCOUNTER — Ambulatory Visit (HOSPITAL_COMMUNITY)
Admission: EM | Admit: 2021-03-11 | Discharge: 2021-03-11 | Disposition: A | Discharge status: Home / Self Care | Payer: Managed Care, Other (non HMO) | Attending: Internal Medicine | Admitting: Internal Medicine

## 2021-03-11 ENCOUNTER — Other Ambulatory Visit: Payer: Self-pay

## 2021-03-11 DIAGNOSIS — R350 Frequency of micturition: Secondary | ICD-10-CM | POA: Diagnosis present

## 2021-03-11 DIAGNOSIS — R300 Dysuria: Secondary | ICD-10-CM

## 2021-03-11 DIAGNOSIS — N390 Urinary tract infection, site not specified: Secondary | ICD-10-CM

## 2021-03-11 LAB — POCT URINALYSIS DIPSTICK, ED / UC
Glucose, UA: 250 mg/dL — AB
Hgb urine dipstick: NEGATIVE
Ketones, ur: 15 mg/dL — AB
Nitrite: POSITIVE — AB
Protein, ur: >=300 mg/dL — AB
Specific Gravity, Urine: <=1.005 (ref 1.005–1.030)
Urobilinogen, UA: >=8 mg/dL (ref 0.0–1.0)
pH: 8.5 — ABNORMAL HIGH (ref 5.0–8.0)

## 2021-03-11 MED ORDER — LIDOCAINE HCL (PF) 1 % IJ SOLN
INTRAMUSCULAR | Status: AC
  Filled 2021-03-11: qty 2

## 2021-03-11 MED ORDER — CEFTRIAXONE SODIUM 1 G IJ SOLR
INTRAMUSCULAR | Status: AC
  Filled 2021-03-11: qty 10

## 2021-03-11 MED ORDER — CEFTRIAXONE SODIUM 1 G IJ SOLR
1.0000 g | Freq: Once | INTRAMUSCULAR | Status: AC
Start: 2021-03-11 — End: 2021-03-11
  Administered 2021-03-11: 1 g via INTRAMUSCULAR

## 2021-03-11 MED ORDER — SULFAMETHOXAZOLE-TRIMETHOPRIM 800-160 MG PO TABS: 1.0000 | ORAL_TABLET | Freq: Two times a day (BID) | ORAL | 0 refills | Status: AC

## 2021-03-11 NOTE — Discharge Instructions (Addendum)
We gave you a shot of antibiotics and I want you to start Bactrim DS twice daily for 7 days.  If you develop any rash or oral lesions you stop the medication be seen immediately.  I do recommend that he follow-up with either our clinic or your primary care provider within a week to ensure that your urine improves.  We will contact you if your urine culture results indicate we need to change her antibiotics.  Make sure you are drinking plenty of fluid.  If you have any worsening symptoms you need to go to the emergency room as we discussed.

## 2021-03-11 NOTE — ED Provider Notes (Signed)
[facility]    CSN: [id number] Arrival date & time: 03/11/21  0801      History   Chief Complaint Chief Complaint  Patient presents with   Dysuria    HPI Sandra Moody is a 50 y.o. female.   Patient presents today with a week and a half long history of urinary symptoms.  Reports frequency, urgency, back pain, lower abdominal pain, dysuria.  She denies hematuria but has been taking Azo so her urine has been discolored.  She denies any fever, nausea, vomiting, changes in bowel habits.  She denies history of nephrolithiasis.  She does have a history of recurrent UTI and states current symptoms are similar to previous episodes of this condition.  She denies any recent antibiotic use.  She has not seen a urologist in the past.  She denies history of single kidney or self-catheterization.   Past Medical History:  Diagnosis Date   Acute renal disease    Asthma    Bilateral carpal tunnel syndrome 03/11/2019   Common migraine with intractable migraine 08/29/2018   Daily headache    (03/08/2017)   Essential hypertension 02/20/2019   GERD (gastroesophageal reflux disease)    H pylori ulcer ~ 08/2016   History of hiatal hernia    Leaky heart valve ~ 09/2016   Migraine    "maybe weekly" (03/08/2017)   PONV (postoperative nausea and vomiting)    Type 2 diabetes mellitus with hyperglycemia, without long-term current use of insulin ([place]) 02/20/2019    Patient Active Problem List   Diagnosis Date Noted   Entrapment of left ulnar nerve 04/28/2020   Diabetic polyneuropathy associated with type 2 diabetes mellitus ([place]) 03/05/2020   Type 2 diabetes mellitus without complication, without long-term current use of insulin ([place]) 09/05/2019   Trigger finger of left thumb 04/30/2019   Infection of flexor tendon sheath 04/15/2019   Carpal tunnel syndrome, bilateral 03/11/2019   Essential hypertension 02/20/2019   Type 2 diabetes mellitus with hyperglycemia, without long-term current use of  insulin ([place]) 02/20/2019   Other acute sinusitis 12/25/2018   Irritable bowel syndrome 12/04/2018   Migraine with aura 12/04/2018   Nonintractable common migraine 08/29/2018   Moderate persistent asthma without complication 58/83/2549   Seasonal and perennial allergic rhinitis 06/28/2017   Mild persistent asthma with acute exacerbation 06/28/2017   Gastroesophageal reflux disease 06/28/2017   Biliary dyskinesia 03/08/2017   Asthma 03/02/2016   Asthma with acute exacerbation 10/04/2015   Other and unspecified ovarian cyst 06/17/2012    Past Surgical History:  Procedure Laterality Date   CARPAL TUNNEL RELEASE Left 12/18/2019   Procedure: CARPAL TUNNEL RELEASE;  Surgeon: [name], MD;  Location: [facility];  Service: Orthopedics;  Laterality: Left;   CARPAL TUNNEL RELEASE Right 07/22/2020   Procedure: CARPAL TUNNEL RELEASE RIGHT;  Surgeon: [name], MD;  Location: [facility];  Service: Orthopedics;  Laterality: Right;   CHOLECYSTECTOMY N/A 03/08/2017   Procedure: LAPAROSCOPIC CHOLECYSTECTOMY;  Surgeon: Coralie Keens, MD;  Location: [facility];  Service: General;  Laterality: N/A;   DILATION AND CURETTAGE OF UTERUS     LAPAROSCOPIC CHOLECYSTECTOMY  03/08/2017   SHOULDER ARTHROSCOPY WITH SUBACROMIAL DECOMPRESSION, ROTATOR CUFF REPAIR AND BICEP TENDON REPAIR Right 07/22/2020   Procedure: SHOULDER ARTHROSCOPY WITH SUBACROMIAL DECOMPRESSION AND DISTAL CLAVICLE EXCISION, ROTATOR CUFF REPAIR AND BICEPS TENODESIS;  Surgeon: Hiram Gash, MD;  Location: [facility];  Service: Orthopedics;  Laterality: Right;   TRIGGER FINGER RELEASE Left 12/18/2019  Procedure: RELEASE TRIGGER FINGER/A-1 PULLEY;  Surgeon: [name], MD;  Location: [facility];  Service: Orthopedics;  Laterality: Left;   TUBAL LIGATION      OB History     Gravida  5   Para  3   Term  3   Preterm  0   AB  2   Living         SAB  1   IAB  1    Ectopic  0   Multiple      Live Births               Home Medications    Prior to Admission medications   Medication Sig Start Date End Date Taking? Authorizing Provider  sulfamethoxazole-trimethoprim (BACTRIM DS) 800-160 MG tablet Take 1 tablet by mouth 2 (two) times daily for 7 days. 03/11/21 03/18/21 Yes Omer Puccinelli, Derry Skill, PA-C  Accu-Chek FastClix Lancets MISC USE TO CHECK GLUCOSE TWICE A DAY ,FASTING AND 2 HOURS AFTER DINNER 08/30/20   Shamleffer, Melanie Crazier, MD  ACCU-CHEK GUIDE test strip USE TO CHECK GLUCOSE TWICE DAILY,FASTING AND 2 HOURS AFTER DINNER 05/09/20   Jacelyn Pi, Lilia Argue, MD  Accu-Chek Softclix Lancets lancets Use as instructed to check blood sugar 2 times a day 08/27/20   Shamleffer, Melanie Crazier, MD  albuterol (PROVENTIL) (2.5 MG/3ML) 0.083% nebulizer solution Take 3 mLs (2.5 mg total) by nebulization every 6 (six) hours as needed for wheezing or shortness of breath. 11/13/19   Wendie Agreste, MD  ALBUTEROL SULFATE PO Take by mouth.    [provider]  BIOTIN PO Take by mouth.    [provider]  blood glucose meter kit and supplies Per insurance preference. Use to check glucose twice a day (fasting and 2 hours after dinner)  Dx E11.65 10/30/18   Jacelyn Pi, Lilia Argue, MD  clobetasol ointment (TEMOVATE) 0.05 % APPLY TOPICALLY TWICE A DAY AS NEEDED FOR ITCHY RASH AS NEEDED 06/28/20   Wendie Agreste, MD  EPIPEN 2-PAK 0.3 MG/0.3ML SOAJ injection Inject 0.3 mg into the muscle as needed (for allergic reaction).  05/18/16   [provider]  fluticasone (FLONASE) 50 MCG/ACT nasal spray USE 1 2 SPRAYS IN EACH NOSTRIL ONCE A DAY 10/25/18   [provider]  Galcanezumab-gnlm (EMGALITY) 120 MG/ML SOAJ Inject 120 mg into the skin every 30 (thirty) days. 01/18/21   Suzzanne Cloud, NP  Insulin Glargine-yfgn (SEMGLEE, YFGN,) 100 UNIT/ML SOLN Inject 40 Units into the skin daily at 2 PM. 08/27/20   Shamleffer, Melanie Crazier, MD  Insulin Pen  Needle (PEN NEEDLES) 32G X 6 MM MISC Use to inject insulin daily 11/22/20   Shamleffer, Melanie Crazier, MD  ketoconazole (NIZORAL) 2 % cream Apply 1 application topically daily. To seborrhea/face lesion as needed. 06/14/20   Wendie Agreste, MD  ketoconazole (NIZORAL) 2 % shampoo Apply 1 application topically 2 (two) times a week. Apply to affected area lather, leave in place for 5 mins and then rinse off with water 01/15/20   Jacelyn Pi, Lilia Argue, MD  levocetirizine (XYZAL) 5 MG tablet 1 2 TABLET IN THE EVENING ONCE A DAY ORALLY 15 11/05/18   [provider]  medroxyPROGESTERone (DEPO-PROVERA) 150 MG/ML injection  08/15/18   [provider]  olopatadine (PATANOL) 0.1 % ophthalmic solution Place 1 drop into both eyes 2 (two) times daily. 12/25/18   Corum, Rex Kras, MD  potassium chloride (KLOR-CON) 10 MEQ tablet TAKE 1 TABLET BY  MOUTH EVERY DAY 09/04/20   Wendie Agreste, MD  rizatriptan (MAXALT) 10 MG tablet Take 1 tablet (10 mg total) by mouth 3 (three) times daily as needed for migraine. 01/18/21   Suzzanne Cloud, NP  sitaGLIPtin (JANUVIA) 100 MG tablet Take 1 tablet (100 mg total) by mouth daily. 04/21/20   Shamleffer, Melanie Crazier, MD  SYMBICORT 160-4.5 MCG/ACT inhaler INHALE 2 PUFFS INTO THE LUNGS DAILY. 05/09/18   Valentina Shaggy, MD  triamcinolone (KENALOG) 0.1 % Apply 1 application topically 2 (two) times daily. To affected areas on face, scalp as needed. 06/14/20   Wendie Agreste, MD  VITAMIN E PO Take by mouth.    [provider]    Family History Family History  Problem Relation Age of Onset   Thyroid disease Mother    Diabetes Maternal Grandmother    Heart disease Maternal Grandfather     Social History Social History   Tobacco Use   Smoking status: Never   Smokeless tobacco: Never  Vaping Use   Vaping Use: Never used  Substance Use Topics   Alcohol use: Not Currently    Comment: 03/08/2017 "might have a drink on a holiday"   Drug use: No      Allergies   Ginger, Passion fruit flavor [flavoring agent], Shellfish allergy, Other, and Tamiflu [oseltamivir phosphate]   Review of Systems Review of Systems  Constitutional:  Negative for activity change, appetite change, fatigue and fever.  Respiratory:  Negative for cough and shortness of breath.   Cardiovascular:  Negative for chest pain.  Gastrointestinal:  Positive for abdominal pain. Negative for diarrhea, nausea and vomiting.  Genitourinary:  Positive for dysuria, frequency and urgency. Negative for hematuria, vaginal bleeding, vaginal discharge and vaginal pain.  Musculoskeletal:  Positive for back pain. Negative for arthralgias and myalgias.  Neurological:  Negative for dizziness, light-headedness and headaches.    Physical Exam Triage Vital Signs ED Triage Vitals  Enc Vitals Group     BP 03/11/21 0814 (!) 154/83     Pulse Rate 03/11/21 0814 92     Resp 03/11/21 0814 16     Temp 03/11/21 0814 98.4 F (36.9 C)     Temp Source 03/11/21 0814 Oral     SpO2 03/11/21 0814 95 %     Weight --      Height --      Head Circumference --      Peak Flow --      Pain Score 03/11/21 0816 8     Pain Loc --      Pain Edu? --      Excl. in Cataract? --    No data found.  Updated Vital Signs BP (!) 154/83 (BP Location: Left Arm)   Pulse 92   Temp 98.4 F (36.9 C) (Oral)   Resp 16   SpO2 95%   Visual Acuity Right Eye Distance:   Left Eye Distance:   Bilateral Distance:    Right Eye Near:   Left Eye Near:    Bilateral Near:     Physical Exam Vitals reviewed.  Constitutional:      General: She is awake. She is not in acute distress.    Appearance: Normal appearance. She is normal weight. She is not ill-appearing.     Comments: Very pleasant female appears stated age in no acute distress sitting comfortably in exam room  HENT:     Head: Normocephalic and atraumatic.  Cardiovascular:     Rate  and Rhythm: Normal rate and regular rhythm.     Heart sounds: Normal  heart sounds, S1 normal and S2 normal. No murmur heard. Pulmonary:     Effort: Pulmonary effort is normal.     Breath sounds: Normal breath sounds. No wheezing, rhonchi or rales.     Comments: Clear to auscultation bilaterally Abdominal:     General: Bowel sounds are normal.     Palpations: Abdomen is soft.     Tenderness: There is no abdominal tenderness. There is no right CVA tenderness, left CVA tenderness, guarding or rebound.     Comments: Benign abdominal exam; no tenderness palpation.  No CVA tenderness.  Psychiatric:        Behavior: Behavior is cooperative.     UC Treatments / Results  Labs (all labs ordered are listed, but only abnormal results are displayed) Labs Reviewed  POCT URINALYSIS DIPSTICK, ED / UC - Abnormal; Notable for the following components:      Result Value   Glucose, UA 250 (*)    Bilirubin Urine MODERATE (*)    Ketones, ur 15 (*)    pH 8.5 (*)    Protein, ur >=300 (*)    Nitrite POSITIVE (*)    Leukocytes,Ua LARGE (*)    All other components within normal limits  URINE CULTURE    EKG   Radiology No results found.  Procedures Procedures (including critical care time)  Medications Ordered in UC Medications  cefTRIAXone (ROCEPHIN) injection 1 g (has no administration in time range)    Initial Impression / Assessment and Plan / UC Course  I have reviewed the triage vital signs and the nursing notes.  Pertinent labs & imaging results that were available during my care of the patient were reviewed by me and considered in my medical decision making (see chart for details).      UA unable to be read because patient had taken Azo.  Will empirically treat for UTI given clinical presentation.  She was given 1 g of Rocephin in clinic today and started on Bactrim DS.  Patient is confident that she is not pregnant.  Recommended she rest and drink plenty of fluid.  We will send off urine for culture and discussed that we may need to change  antibiotics based on susceptibilities identified on culture.  Encourage patient to follow-up with either our clinic or primary care provider within a few weeks to recheck urine to ensure clearing of infection.  Discussed alarm symptoms that warrant emergent evaluation.  Strict return precautions given to which patient expressed understanding.  Final Clinical Impressions(s) / UC Diagnoses   Final diagnoses:  Lower urinary tract infectious disease  Dysuria  Urinary frequency     Discharge Instructions      We gave you a shot of antibiotics and I want you to start Bactrim DS twice daily for 7 days.  If you develop any rash or oral lesions you stop the medication be seen immediately.  I do recommend that he follow-up with either our clinic or your primary care provider within a week to ensure that your urine improves.  We will contact you if your urine culture results indicate we need to change her antibiotics.  Make sure you are drinking plenty of fluid.  If you have any worsening symptoms you need to go to the emergency room as we discussed.     ED Prescriptions     Medication Sig Dispense Auth. Provider   sulfamethoxazole-trimethoprim (BACTRIM DS) 800-160  MG tablet Take 1 tablet by mouth 2 (two) times daily for 7 days. 14 tablet [place] Lingenfelter, Derry Skill, PA-C      PDMP not reviewed this encounter.   Terrilee Croak, PA-C 03/11/21 [phone]

## 2021-03-11 NOTE — ED Triage Notes (Signed)
Monday began having lower abdominal and back pain with dysuria. Has been taking Azo to manage symptoms. Now having difficulty urinating and continued pain.

## 2021-03-12 LAB — URINE CULTURE: Culture: NO GROWTH

## 2021-04-08 ENCOUNTER — Encounter: Payer: Self-pay | Admitting: Neurology

## 2021-04-14 ENCOUNTER — Telehealth: Payer: Self-pay

## 2021-04-14 MED ORDER — EMGALITY 120 MG/ML ~~LOC~~ SOAJ: 120.0000 mg | Freq: Once | SUBCUTANEOUS | 0 refills | Status: AC

## 2021-04-14 NOTE — Telephone Encounter (Signed)
Pt presented today to pick up samples of emgality.

## 2021-05-17 ENCOUNTER — Telehealth: Payer: Self-pay

## 2021-05-17 NOTE — Telephone Encounter (Signed)
I submitted PA for Emgality on CMM, Key: [id number]. Awaiting determination from [employer].

## 2021-05-23 ENCOUNTER — Other Ambulatory Visit: Payer: Self-pay

## 2021-05-23 ENCOUNTER — Encounter (HOSPITAL_COMMUNITY): Payer: Self-pay

## 2021-05-23 ENCOUNTER — Ambulatory Visit (HOSPITAL_COMMUNITY)
Admission: EM | Admit: 2021-05-23 | Discharge: 2021-05-23 | Disposition: A | Discharge status: Home / Self Care | Payer: BLUE CROSS/BLUE SHIELD | Attending: Emergency Medicine | Admitting: Emergency Medicine

## 2021-05-23 DIAGNOSIS — M545 Low back pain, unspecified: Secondary | ICD-10-CM | POA: Diagnosis not present

## 2021-05-23 MED ORDER — METHYLPREDNISOLONE SODIUM SUCC 125 MG IJ SOLR
60.0000 mg | Freq: Once | INTRAMUSCULAR | Status: AC
  Administered 2021-05-23: 60 mg via INTRAMUSCULAR

## 2021-05-23 MED ORDER — CYCLOBENZAPRINE HCL 10 MG PO TABS: 10.0000 mg | ORAL_TABLET | Freq: Every day | ORAL | 0 refills | Status: DC

## 2021-05-23 MED ORDER — KETOROLAC TROMETHAMINE 30 MG/ML IJ SOLN
30.0000 mg | Freq: Once | INTRAMUSCULAR | Status: AC
  Administered 2021-05-23: 30 mg via INTRAMUSCULAR

## 2021-05-23 MED ORDER — METHYLPREDNISOLONE SODIUM SUCC 125 MG IJ SOLR
INTRAMUSCULAR | Status: AC
  Filled 2021-05-23: qty 2

## 2021-05-23 MED ORDER — KETOROLAC TROMETHAMINE 30 MG/ML IJ SOLN
INTRAMUSCULAR | Status: AC
  Filled 2021-05-23: qty 1

## 2021-05-23 MED ORDER — MELOXICAM 15 MG PO TABS: 15.0000 mg | ORAL_TABLET | Freq: Every day | ORAL | 0 refills | Status: DC

## 2021-05-23 NOTE — Discharge Instructions (Signed)
Your pain is most likely caused by irritation to the muscles or ligaments.   May take meloxicam every morning as needed  May use Flexeril before bed for additional comfort, be mindful this medication may make you drowsy  You may use heating pad in 15 minute intervals as needed for additional comfort, within the first 2-3 days you may find comfort in using ice in 10-15 minutes over affected area  Begin stretching affected area daily for 10 minutes as tolerated to further loosen muscles   When lying down place pillow underneath and between knees for support  Can try sleeping without pillow on firm mattress   Practice good posture: head back, shoulders back, chest forward, pelvis back and weight distributed evenly on both legs  If pain persist after recommended treatment or reoccurs if may be beneficial to follow up with orthopedic specialist for evaluation, this doctor specializes in the bones and can manage your symptoms long-term with options such as but not limited to imaging, medications or physical therapy

## 2021-05-23 NOTE — ED Triage Notes (Signed)
Pt presents with back pain on lower right side X 2 weeks with some redness & swelling in that area and no reports of any urinary symptoms.

## 2021-05-24 NOTE — ED Provider Notes (Signed)
[place]    CSN: [id number] Arrival date & time: 05/23/21  1824      History   Chief Complaint Chief Complaint  Patient presents with   Back Pain    HPI JACEY ECKERSON is a 50 y.o. female.   Patient presents with right-sided low back pain intermittently radiating into the right upper thigh for 2 weeks.  Denies precipitating event, injury or trauma, numbness or tingling.  Range of motion intact but elicits pain. denies changes in urinary or bowel habits.  Symptoms have not occurred before.  Has attempted use of over-the-counter medication with no improvement.  History of type 2 diabetes, hypertension, migraines, asthma.   Past Medical History:  Diagnosis Date   Acute renal disease    Asthma    Bilateral carpal tunnel syndrome 03/11/2019   Common migraine with intractable migraine 08/29/2018   Daily headache    (03/08/2017)   Essential hypertension 02/20/2019   GERD (gastroesophageal reflux disease)    H pylori ulcer ~ 08/2016   History of hiatal hernia    Leaky heart valve ~ 09/2016   Migraine    "maybe weekly" (03/08/2017)   PONV (postoperative nausea and vomiting)    Type 2 diabetes mellitus with hyperglycemia, without long-term current use of insulin ([place]) 02/20/2019    Patient Active Problem List   Diagnosis Date Noted   Entrapment of left ulnar nerve 04/28/2020   Diabetic polyneuropathy associated with type 2 diabetes mellitus ([place]) 03/05/2020   Type 2 diabetes mellitus without complication, without long-term current use of insulin ([place]) 09/05/2019   Trigger finger of left thumb 04/30/2019   Infection of flexor tendon sheath 04/15/2019   Carpal tunnel syndrome, bilateral 03/11/2019   Essential hypertension 02/20/2019   Type 2 diabetes mellitus with hyperglycemia, without long-term current use of insulin ([place]) 02/20/2019   Other acute sinusitis 12/25/2018   Irritable bowel syndrome 12/04/2018   Migraine with aura 12/04/2018   Nonintractable common migraine  08/29/2018   Moderate persistent asthma without complication 87/86/7672   Seasonal and perennial allergic rhinitis 06/28/2017   Mild persistent asthma with acute exacerbation 06/28/2017   Gastroesophageal reflux disease 06/28/2017   Biliary dyskinesia 03/08/2017   Asthma 03/02/2016   Asthma with acute exacerbation 10/04/2015   Other and unspecified ovarian cyst 06/17/2012    Past Surgical History:  Procedure Laterality Date   CARPAL TUNNEL RELEASE Left 12/18/2019   Procedure: CARPAL TUNNEL RELEASE;  Surgeon: [name], MD;  Location: [facility];  Service: Orthopedics;  Laterality: Left;   CARPAL TUNNEL RELEASE Right 07/22/2020   Procedure: CARPAL TUNNEL RELEASE RIGHT;  Surgeon: [name], MD;  Location: [facility];  Service: Orthopedics;  Laterality: Right;   CHOLECYSTECTOMY N/A 03/08/2017   Procedure: LAPAROSCOPIC CHOLECYSTECTOMY;  Surgeon: Coralie Keens, MD;  Location: [facility];  Service: General;  Laterality: N/A;   DILATION AND CURETTAGE OF UTERUS     LAPAROSCOPIC CHOLECYSTECTOMY  03/08/2017   SHOULDER ARTHROSCOPY WITH SUBACROMIAL DECOMPRESSION, ROTATOR CUFF REPAIR AND BICEP TENDON REPAIR Right 07/22/2020   Procedure: SHOULDER ARTHROSCOPY WITH SUBACROMIAL DECOMPRESSION AND DISTAL CLAVICLE EXCISION, ROTATOR CUFF REPAIR AND BICEPS TENODESIS;  Surgeon: Hiram Gash, MD;  Location: [facility];  Service: Orthopedics;  Laterality: Right;   TRIGGER FINGER RELEASE Left 12/18/2019   Procedure: RELEASE TRIGGER FINGER/A-1 PULLEY;  Surgeon: [name], MD;  Location: [facility];  Service: Orthopedics;  Laterality: Left;   TUBAL LIGATION      OB History  Gravida  5   Para  3   Term  3   Preterm  0   AB  2   Living         SAB  1   IAB  1   Ectopic  0   Multiple      Live Births               Home Medications    Prior to Admission medications   Medication Sig Start Date End Date Taking?  Authorizing Provider  cyclobenzaprine (FLEXERIL) 10 MG tablet Take 1 tablet (10 mg total) by mouth at bedtime. 05/23/21  Yes Safia Panzer R, NP  meloxicam (MOBIC) 15 MG tablet Take 1 tablet (15 mg total) by mouth daily. 05/23/21  Yes Chancie Lampert, Leitha Schuller, NP  Accu-Chek FastClix Lancets MISC USE TO CHECK GLUCOSE TWICE A DAY ,FASTING AND 2 HOURS AFTER DINNER 08/30/20   Shamleffer, Melanie Crazier, MD  ACCU-CHEK GUIDE test strip USE TO CHECK GLUCOSE TWICE DAILY,FASTING AND 2 HOURS AFTER DINNER 05/09/20   Jacelyn Pi, Lilia Argue, MD  Accu-Chek Softclix Lancets lancets Use as instructed to check blood sugar 2 times a day 08/27/20   Shamleffer, Melanie Crazier, MD  albuterol (PROVENTIL) (2.5 MG/3ML) 0.083% nebulizer solution Take 3 mLs (2.5 mg total) by nebulization every 6 (six) hours as needed for wheezing or shortness of breath. 11/13/19   Wendie Agreste, MD  ALBUTEROL SULFATE PO Take by mouth.    [provider]  BIOTIN PO Take by mouth.    [provider]  blood glucose meter kit and supplies Per insurance preference. Use to check glucose twice a day (fasting and 2 hours after dinner)  Dx E11.65 10/30/18   Jacelyn Pi, Lilia Argue, MD  clobetasol ointment (TEMOVATE) 0.05 % APPLY TOPICALLY TWICE A DAY AS NEEDED FOR ITCHY RASH AS NEEDED 06/28/20   Wendie Agreste, MD  EPIPEN 2-PAK 0.3 MG/0.3ML SOAJ injection Inject 0.3 mg into the muscle as needed (for allergic reaction).  05/18/16   [provider]  fluticasone (FLONASE) 50 MCG/ACT nasal spray USE 1 2 SPRAYS IN EACH NOSTRIL ONCE A DAY 10/25/18   [provider]  Galcanezumab-gnlm (EMGALITY) 120 MG/ML SOAJ Inject 120 mg into the skin every 30 (thirty) days. 01/18/21   Suzzanne Cloud, NP  Insulin Glargine-yfgn (SEMGLEE, YFGN,) 100 UNIT/ML SOLN Inject 40 Units into the skin daily at 2 PM. 08/27/20   Shamleffer, Melanie Crazier, MD  Insulin Pen Needle (PEN NEEDLES) 32G X 6 MM MISC Use to inject insulin daily 11/22/20    Shamleffer, Melanie Crazier, MD  ketoconazole (NIZORAL) 2 % cream Apply 1 application topically daily. To seborrhea/face lesion as needed. 06/14/20   Wendie Agreste, MD  ketoconazole (NIZORAL) 2 % shampoo Apply 1 application topically 2 (two) times a week. Apply to affected area lather, leave in place for 5 mins and then rinse off with water 01/15/20   Jacelyn Pi, Lilia Argue, MD  levocetirizine (XYZAL) 5 MG tablet 1 2 TABLET IN THE EVENING ONCE A DAY ORALLY 15 11/05/18   [provider]  medroxyPROGESTERone (DEPO-PROVERA) 150 MG/ML injection  08/15/18   [provider]  olopatadine (PATANOL) 0.1 % ophthalmic solution Place 1 drop into both eyes 2 (two) times daily. 12/25/18   Corum, Rex Kras, MD  potassium chloride (KLOR-CON) 10 MEQ tablet TAKE 1 TABLET BY MOUTH EVERY DAY 09/04/20   Wendie Agreste, MD  rizatriptan (MAXALT) 10 MG tablet Take 1  tablet (10 mg total) by mouth 3 (three) times daily as needed for migraine. 01/18/21   Suzzanne Cloud, NP  sitaGLIPtin (JANUVIA) 100 MG tablet Take 1 tablet (100 mg total) by mouth daily. 04/21/20   Shamleffer, Melanie Crazier, MD  SYMBICORT 160-4.5 MCG/ACT inhaler INHALE 2 PUFFS INTO THE LUNGS DAILY. 05/09/18   Valentina Shaggy, MD  triamcinolone (KENALOG) 0.1 % Apply 1 application topically 2 (two) times daily. To affected areas on face, scalp as needed. 06/14/20   Wendie Agreste, MD  VITAMIN E PO Take by mouth.    [provider]    Family History Family History  Problem Relation Age of Onset   Thyroid disease Mother    Diabetes Maternal Grandmother    Heart disease Maternal Grandfather     Social History Social History   Tobacco Use   Smoking status: Never   Smokeless tobacco: Never  Vaping Use   Vaping Use: Never used  Substance Use Topics   Alcohol use: Not Currently    Comment: 03/08/2017 "might have a drink on a holiday"   Drug use: No     Allergies   Ginger, Passion fruit flavor [flavoring agent],  Shellfish allergy, Other, and Tamiflu [oseltamivir phosphate]   Review of Systems Review of Systems  Constitutional: Negative.   Respiratory: Negative.    Cardiovascular: Negative.   Musculoskeletal:  Positive for back pain. Negative for arthralgias, gait problem, joint swelling, myalgias, neck pain and neck stiffness.  Skin: Negative.   Neurological: Negative.     Physical Exam Triage Vital Signs ED Triage Vitals  Enc Vitals Group     BP 05/23/21 1953 139/84     Pulse Rate 05/23/21 1953 87     Resp 05/23/21 1953 17     Temp 05/23/21 1953 98.7 F (37.1 C)     Temp Source 05/23/21 1953 Oral     SpO2 05/23/21 1953 98 %     Weight --      Height --      Head Circumference --      Peak Flow --      Pain Score 05/23/21 1956 10     Pain Loc --      Pain Edu? --      Excl. in [place]? --    No data found.  Updated Vital Signs BP 139/84 (BP Location: Left Arm)   Pulse 87   Temp 98.7 F (37.1 C) (Oral)   Resp 17   SpO2 98%   Visual Acuity Right Eye Distance:   Left Eye Distance:   Bilateral Distance:    Right Eye Near:   Left Eye Near:    Bilateral Near:     Physical Exam Constitutional:      Appearance: Normal appearance. She is normal weight.  HENT:     Head: Normocephalic.  Eyes:     Extraocular Movements: Extraocular movements intact.  Pulmonary:     Effort: Pulmonary effort is normal.  Musculoskeletal:     Comments: Tenderness over the right lower latissimus dorsi, range of motion intact, no crepitus, edema or spasms noted mild erythema over right lower back   Skin:    General: Skin is warm and dry.  Neurological:     Mental Status: She is alert and oriented to person, place, and time. Mental status is at baseline.  Psychiatric:        Mood and Affect: Mood normal.        Behavior: Behavior normal.  UC Treatments / Results  Labs (all labs ordered are listed, but only abnormal results are displayed) Labs Reviewed - No data to  display  EKG   Radiology No results found.  Procedures Procedures (including critical care time)  Medications Ordered in UC Medications  methylPREDNISolone sodium succinate (SOLU-MEDROL) 125 mg/2 mL injection 60 mg (60 mg Intramuscular Given 05/23/21 2017)  ketorolac (TORADOL) 30 MG/ML injection 30 mg (30 mg Intramuscular Given 05/23/21 2017)    Initial Impression / Assessment and Plan / UC Course  I have reviewed the triage vital signs and the nursing notes.  Pertinent labs & imaging results that were available during my care of the patient were reviewed by me and considered in my medical decision making (see chart for details).  Acute right-sided low back pain without sciatica  1. Methylprednisolone 60 mg IV, Toradol 30 mg IM 2.  Meloxicam 15 mg daily as needed 3.  Flexeril 10 mg at bedtime as needed 4.  Recommended heating pad 15-minute intervals, daily stretching, pillows for support 5.  Orthopedic follow-up as needed Final Clinical Impressions(s) / UC Diagnoses   Final diagnoses:  Acute right-sided low back pain without sciatica     Discharge Instructions      Your pain is most likely caused by irritation to the muscles or ligaments.   May take meloxicam every morning as needed  May use Flexeril before bed for additional comfort, be mindful this medication may make you drowsy  You may use heating pad in 15 minute intervals as needed for additional comfort, within the first 2-3 days you may find comfort in using ice in 10-15 minutes over affected area  Begin stretching affected area daily for 10 minutes as tolerated to further loosen muscles   When lying down place pillow underneath and between knees for support  Can try sleeping without pillow on firm mattress   Practice good posture: head back, shoulders back, chest forward, pelvis back and weight distributed evenly on both legs  If pain persist after recommended treatment or reoccurs if may be beneficial  to follow up with orthopedic specialist for evaluation, this doctor specializes in the bones and can manage your symptoms long-term with options such as but not limited to imaging, medications or physical therapy      ED Prescriptions     Medication Sig Dispense Auth. Provider   meloxicam (MOBIC) 15 MG tablet Take 1 tablet (15 mg total) by mouth daily. 30 tablet Chaske Paskett R, NP   cyclobenzaprine (FLEXERIL) 10 MG tablet Take 1 tablet (10 mg total) by mouth at bedtime. 10 tablet Hans Eden, NP      PDMP not reviewed this encounter.   Hans Eden, [place] 05/24/21 [phone]

## 2021-06-11 ENCOUNTER — Emergency Department (HOSPITAL_COMMUNITY)
Admission: EM | Admit: 2021-06-11 | Discharge: 2021-06-11 | Disposition: A | Discharge status: Home / Self Care | Payer: BLUE CROSS/BLUE SHIELD | Attending: Emergency Medicine | Admitting: Emergency Medicine

## 2021-06-11 ENCOUNTER — Encounter (HOSPITAL_COMMUNITY): Payer: Self-pay | Admitting: Emergency Medicine

## 2021-06-11 ENCOUNTER — Other Ambulatory Visit: Payer: Self-pay

## 2021-06-11 DIAGNOSIS — I10 Essential (primary) hypertension: Secondary | ICD-10-CM | POA: Diagnosis not present

## 2021-06-11 DIAGNOSIS — Z7951 Long term (current) use of inhaled steroids: Secondary | ICD-10-CM | POA: Diagnosis not present

## 2021-06-11 DIAGNOSIS — J4540 Moderate persistent asthma, uncomplicated: Secondary | ICD-10-CM | POA: Diagnosis not present

## 2021-06-11 DIAGNOSIS — Z79899 Other long term (current) drug therapy: Secondary | ICD-10-CM | POA: Diagnosis not present

## 2021-06-11 DIAGNOSIS — R109 Unspecified abdominal pain: Secondary | ICD-10-CM | POA: Diagnosis present

## 2021-06-11 DIAGNOSIS — N200 Calculus of kidney: Secondary | ICD-10-CM | POA: Diagnosis not present

## 2021-06-11 DIAGNOSIS — E1142 Type 2 diabetes mellitus with diabetic polyneuropathy: Secondary | ICD-10-CM | POA: Insufficient documentation

## 2021-06-11 DIAGNOSIS — Z794 Long term (current) use of insulin: Secondary | ICD-10-CM | POA: Diagnosis not present

## 2021-06-11 LAB — CBC WITH DIFFERENTIAL/PLATELET
Abs Immature Granulocytes: 0.04 10*3/uL (ref 0.00–0.07)
Basophils Absolute: 0.1 10*3/uL (ref 0.0–0.1)
Basophils Relative: 1 %
Eosinophils Absolute: 0.2 10*3/uL (ref 0.0–0.5)
Eosinophils Relative: 2 %
HCT: 41.2 % (ref 36.0–46.0)
Hemoglobin: 13.4 g/dL (ref 12.0–15.0)
Immature Granulocytes: 0 %
Lymphocytes Relative: 32 %
Lymphs Abs: 3.1 10*3/uL (ref 0.7–4.0)
MCH: 30.8 pg (ref 26.0–34.0)
MCHC: 32.5 g/dL (ref 30.0–36.0)
MCV: 94.7 fL (ref 80.0–100.0)
Monocytes Absolute: 0.8 10*3/uL (ref 0.1–1.0)
Monocytes Relative: 8 %
Neutro Abs: 5.8 10*3/uL (ref 1.7–7.7)
Neutrophils Relative %: 57 %
Platelets: 329 10*3/uL (ref 150–400)
RBC: 4.35 MIL/uL (ref 3.87–5.11)
RDW: 13.4 % (ref 11.5–15.5)
WBC: 10 10*3/uL (ref 4.0–10.5)
nRBC: 0 % (ref 0.0–0.2)

## 2021-06-11 LAB — URINALYSIS, ROUTINE W REFLEX MICROSCOPIC
Bilirubin Urine: NEGATIVE
Glucose, UA: NEGATIVE mg/dL
Hgb urine dipstick: NEGATIVE
Ketones, ur: NEGATIVE mg/dL
Nitrite: NEGATIVE
Protein, ur: NEGATIVE mg/dL
Specific Gravity, Urine: 1.026 (ref 1.005–1.030)
pH: 5 (ref 5.0–8.0)

## 2021-06-11 LAB — PREGNANCY, URINE: Preg Test, Ur: NEGATIVE

## 2021-06-11 LAB — COMPREHENSIVE METABOLIC PANEL
ALT: 14 U/L (ref 0–44)
AST: 14 U/L — ABNORMAL LOW (ref 15–41)
Albumin: 3.4 g/dL — ABNORMAL LOW (ref 3.5–5.0)
Alkaline Phosphatase: 64 U/L (ref 38–126)
Anion gap: 7 (ref 5–15)
BUN: 12 mg/dL (ref 6–20)
CO2: 24 mmol/L (ref 22–32)
Calcium: 8.6 mg/dL — ABNORMAL LOW (ref 8.9–10.3)
Chloride: 105 mmol/L (ref 98–111)
Creatinine, Ser: 0.85 mg/dL (ref 0.44–1.00)
GFR, Estimated: >60 mL/min (ref >60)
Glucose, Bld: 164 mg/dL — ABNORMAL HIGH (ref 70–99)
Potassium: 3.3 mmol/L — ABNORMAL LOW (ref 3.5–5.1)
Sodium: 136 mmol/L (ref 135–145)
Total Bilirubin: 0.6 mg/dL (ref 0.3–1.2)
Total Protein: 6.3 g/dL — ABNORMAL LOW (ref 6.5–8.1)

## 2021-06-11 MED ORDER — OXYCODONE-ACETAMINOPHEN 5-325 MG PO TABS: 1.0000 | ORAL_TABLET | Freq: Four times a day (QID) | ORAL | 0 refills | Status: DC | PRN

## 2021-06-11 MED ORDER — MORPHINE SULFATE (PF) 4 MG/ML IV SOLN
4.0000 mg | Freq: Once | INTRAVENOUS | Status: AC
  Administered 2021-06-11: 4 mg via INTRAVENOUS
  Filled 2021-06-11: qty 1

## 2021-06-11 MED ORDER — ONDANSETRON HCL 4 MG/2ML IJ SOLN
4.0000 mg | Freq: Once | INTRAMUSCULAR | Status: AC
  Administered 2021-06-11: 4 mg via INTRAVENOUS
  Filled 2021-06-11: qty 2

## 2021-06-11 MED ORDER — KETOROLAC TROMETHAMINE 15 MG/ML IJ SOLN
15.0000 mg | Freq: Once | INTRAMUSCULAR | Status: AC
  Administered 2021-06-11: 15 mg via INTRAVENOUS
  Filled 2021-06-11: qty 1

## 2021-06-11 MED ORDER — ONDANSETRON 4 MG PO TBDP: 4.0000 mg | ORAL_TABLET | Freq: Three times a day (TID) | ORAL | 0 refills | Status: DC | PRN

## 2021-06-11 MED ORDER — ONDANSETRON 4 MG PO TBDP
4.0000 mg | ORAL_TABLET | Freq: Once | ORAL | Status: AC
  Administered 2021-06-11: 4 mg via ORAL
  Filled 2021-06-11: qty 1

## 2021-06-11 NOTE — ED Provider Notes (Signed)
Emergency Medicine Provider Triage Evaluation Note  Sandra Moody , a 50 y.o. female  was evaluated in triage.  Pt complains of known kidney stone diagnosed Monday who saw urology yesterday. Kidney stone 41mm.  Taking Flomax, tramadol which he last took 1 hour prior to arrival.  Patient reports increasing pain, nausea, some headache that is lateralized, without vision changes beginning this morning.  Patient took her blood pressure and so that was slightly elevated this morning thought that something is changed and went to be evaluated.  Review of Systems  Positive: Nausea suprapubic pain, right flank pain Negative: Dysuria, hematuria  Physical Exam  BP (!) 155/92 (BP Location: Left Arm)   Pulse (!) 104   Temp 98.4 F (36.9 C) (Oral)   Resp 18   SpO2 98%  Gen:   Awake, minimal distress Resp:  Normal effort  MSK:   Moves extremities without difficulty  Other:  Tachy with normal rhythm -- suprapubic ttp and right CVA  Medical Decision Making  Medically screening exam initiated at 8:56 AM.  Appropriate orders placed.  NIKIE CID was informed that the remainder of the evaluation will be completed by another provider, this initial triage assessment does not replace that evaluation, and the importance of remaining in the ED until their evaluation is complete.  Known kidney stone 74mm with pain   Olene Floss, PA-C 06/11/21 0677    Arby Barrette, MD 06/11/21 [phone]

## 2021-06-11 NOTE — ED Notes (Signed)
E-signature pad unavailable at time of pt discharge. Primary RN discussed discharge materials with pt and answered all pt questions. Pt stated understanding of discharge material.

## 2021-06-11 NOTE — ED Provider Notes (Signed)
[facility] EMERGENCY DEPARTMENT Provider Note   CSN: [id number] Arrival date & time: 06/11/21  0831     History Chief Complaint  Patient presents with   Nephrolithiasis    DESA RECH is a 50 y.o. female with known right-sided nephrolithiasis who presents with right flank pain and right abdominal pain as well as nausea.  Patient with gradual symptoms progressing for the last 3 weeks with right-sided flank pain and dysuria.  Underwent CT renal study in the outpatient setting which diagnosed 5 mm stone in the distal right ureter with minimal hydronephrosis on the right.  Urinalysis time was not convincing for infection.  Patient did follow-up in the outpatient setting with [facility] urology yesterday who discharged her home with Flomax and instructions to continue the tramadol she had been provided from her primary care doctor.  She presents today with poorly controlled pain and new nausea.  Denies any fevers, chills , or vomiting.  Urinating normally,  with mild urgency.   I have personally reviewed this patient's medical records.  She has history of type 2 diabetes, GERD, migraines, and hypertension.  She is not anticoagulated.  HPI     Past Medical History:  Diagnosis Date   Acute renal disease    Asthma    Bilateral carpal tunnel syndrome 03/11/2019   Common migraine with intractable migraine 08/29/2018   Daily headache    (03/08/2017)   Essential hypertension 02/20/2019   GERD (gastroesophageal reflux disease)    H pylori ulcer ~ 08/2016   History of hiatal hernia    Leaky heart valve ~ 09/2016   Migraine    "maybe weekly" (03/08/2017)   PONV (postoperative nausea and vomiting)    Type 2 diabetes mellitus with hyperglycemia, without long-term current use of insulin ([place]) 02/20/2019    Patient Active Problem List   Diagnosis Date Noted   Entrapment of left ulnar nerve 04/28/2020   Diabetic polyneuropathy associated with type 2 diabetes mellitus  ([place]) 03/05/2020   Type 2 diabetes mellitus without complication, without long-term current use of insulin ([place]) 09/05/2019   Trigger finger of left thumb 04/30/2019   Infection of flexor tendon sheath 04/15/2019   Carpal tunnel syndrome, bilateral 03/11/2019   Essential hypertension 02/20/2019   Type 2 diabetes mellitus with hyperglycemia, without long-term current use of insulin ([place]) 02/20/2019   Other acute sinusitis 12/25/2018   Irritable bowel syndrome 12/04/2018   Migraine with aura 12/04/2018   Nonintractable common migraine 08/29/2018   Moderate persistent asthma without complication 31/49/7026   Seasonal and perennial allergic rhinitis 06/28/2017   Mild persistent asthma with acute exacerbation 06/28/2017   Gastroesophageal reflux disease 06/28/2017   Biliary dyskinesia 03/08/2017   Asthma 03/02/2016   Asthma with acute exacerbation 10/04/2015   Other and unspecified ovarian cyst 06/17/2012    Past Surgical History:  Procedure Laterality Date   CARPAL TUNNEL RELEASE Left 12/18/2019   Procedure: CARPAL TUNNEL RELEASE;  Surgeon: [name], MD;  Location: [facility];  Service: Orthopedics;  Laterality: Left;   CARPAL TUNNEL RELEASE Right 07/22/2020   Procedure: CARPAL TUNNEL RELEASE RIGHT;  Surgeon: [name], MD;  Location: [place];  Service: Orthopedics;  Laterality: Right;   CHOLECYSTECTOMY N/A 03/08/2017   Procedure: LAPAROSCOPIC CHOLECYSTECTOMY;  Surgeon: Coralie Keens, MD;  Location: [place];  Service: General;  Laterality: N/A;   DILATION AND CURETTAGE OF UTERUS     LAPAROSCOPIC CHOLECYSTECTOMY  03/08/2017   SHOULDER ARTHROSCOPY WITH SUBACROMIAL  DECOMPRESSION, ROTATOR CUFF REPAIR AND BICEP TENDON REPAIR Right 07/22/2020   Procedure: SHOULDER ARTHROSCOPY WITH SUBACROMIAL DECOMPRESSION AND DISTAL CLAVICLE EXCISION, ROTATOR CUFF REPAIR AND BICEPS TENODESIS;  Surgeon: Hiram Gash, MD;  Location: [place];  Service:  Orthopedics;  Laterality: Right;   TRIGGER FINGER RELEASE Left 12/18/2019   Procedure: RELEASE TRIGGER FINGER/A-1 PULLEY;  Surgeon: [name], MD;  Location: [place];  Service: Orthopedics;  Laterality: Left;   TUBAL LIGATION       OB History     Gravida  5   Para  3   Term  3   Preterm  0   AB  2   Living         SAB  1   IAB  1   Ectopic  0   Multiple      Live Births              Family History  Problem Relation Age of Onset   Thyroid disease Mother    Diabetes Maternal Grandmother    Heart disease Maternal Grandfather     Social History   Tobacco Use   Smoking status: Never   Smokeless tobacco: Never  Vaping Use   Vaping Use: Never used  Substance Use Topics   Alcohol use: Not Currently    Comment: 03/08/2017 "might have a drink on a holiday"   Drug use: No    Home Medications Prior to Admission medications   Medication Sig Start Date End Date Taking? Authorizing Provider  albuterol (PROVENTIL) (2.5 MG/3ML) 0.083% nebulizer solution Take 3 mLs (2.5 mg total) by nebulization every 6 (six) hours as needed for wheezing or shortness of breath. 11/13/19  Yes Wendie Agreste, MD  azelastine (OPTIVAR) 0.05 % ophthalmic solution Place 1 drop into both eyes daily.   Yes [provider]  BIOTIN PO Take 1 tablet by mouth daily.   Yes [provider]  Galcanezumab-gnlm (EMGALITY) 120 MG/ML SOAJ Inject 120 mg into the skin every 30 (thirty) days. 01/18/21  Yes Suzzanne Cloud, NP  Insulin Glargine-yfgn (SEMGLEE, YFGN,) 100 UNIT/ML SOLN Inject 40 Units into the skin daily at 2 PM. Patient taking differently: Inject 24 Units into the skin daily at 2 PM. 08/27/20  Yes Shamleffer, Melanie Crazier, MD  ketoconazole (NIZORAL) 2 % cream Apply 1 application topically daily. To seborrhea/face lesion as needed. 06/14/20  Yes Wendie Agreste, MD  ketoconazole (NIZORAL) 2 % shampoo Apply 1 application topically 2 (two) times a week.  Apply to affected area lather, leave in place for 5 mins and then rinse off with water 01/15/20  Yes Jacelyn Pi, Lilia Argue, MD  levocetirizine (XYZAL) 5 MG tablet Take 5 mg by mouth daily. 11/05/18  Yes [provider]  medroxyPROGESTERone (DEPO-PROVERA) 150 MG/ML injection Inject 150 mg into the muscle every 3 (three) months. 08/15/18  Yes [provider]  ondansetron (ZOFRAN ODT) 4 MG disintegrating tablet Take 1 tablet (4 mg total) by mouth every 8 (eight) hours as needed for nausea or vomiting. 06/11/21  Yes Sponseller, Gypsy Balsam, PA-C  oxyCODONE-acetaminophen (PERCOCET/ROXICET) 5-325 MG tablet Take 1 tablet by mouth every 6 (six) hours as needed for severe pain. 06/11/21  Yes Sponseller, Rebekah R, PA-C  SEMGLEE, YFGN, 100 UNIT/ML Pen Inject 24 Units into the skin daily. 05/17/21  Yes [provider]  sitaGLIPtin (JANUVIA) 100 MG tablet Take 1 tablet (100 mg total) by mouth daily. 04/21/20  Yes Shamleffer, Mammie Lorenzo  Amedeo Kinsman, MD  SYMBICORT 160-4.5 MCG/ACT inhaler INHALE 2 PUFFS INTO THE LUNGS DAILY. Patient taking differently: Inhale 2 puffs into the lungs daily. 05/09/18  Yes Valentina Shaggy, MD  tamsulosin (FLOMAX) 0.4 MG CAPS capsule Take 0.4 mg by mouth daily. 06/09/21  Yes [provider]  Accu-Chek FastClix Lancets MISC USE TO CHECK GLUCOSE TWICE A DAY ,FASTING AND 2 HOURS AFTER DINNER Patient taking differently: 1 each by Other route See admin instructions. USE TO CHECK GLUCOSE TWICE A DAY ,FASTING AND 2 HOURS AFTER DINNER 08/30/20   Shamleffer, Melanie Crazier, MD  ACCU-CHEK GUIDE test strip USE TO CHECK GLUCOSE TWICE DAILY,FASTING AND 2 HOURS AFTER DINNER Patient taking differently: 1 each by Other route See admin instructions. USE TO CHECK GLUCOSE TWICE DAILY,FASTING AND 2 HOURS AFTER DINNER 05/09/20   Jacelyn Pi, Lilia Argue, MD  Accu-Chek Softclix Lancets lancets Use as instructed to check blood sugar 2 times a day Patient taking differently: 1 each by  Other route See admin instructions. Use as instructed to check blood sugar 2 times a day 08/27/20   Shamleffer, Melanie Crazier, MD  blood glucose meter kit and supplies Per insurance preference. Use to check glucose twice a day (fasting and 2 hours after dinner)  Dx E11.65 Patient taking differently: 1 each by Other route See admin instructions. Per insurance preference. Use to check glucose twice a day (fasting and 2 hours after dinner)  Dx E11.65 10/30/18   Jacelyn Pi, Lilia Argue, MD  clobetasol ointment (TEMOVATE) 0.05 % APPLY TOPICALLY TWICE A DAY AS NEEDED FOR ITCHY RASH AS NEEDED Patient not taking: No sig reported 06/28/20   Wendie Agreste, MD  cyclobenzaprine (FLEXERIL) 10 MG tablet Take 1 tablet (10 mg total) by mouth at bedtime. Patient not taking: No sig reported 05/23/21   Lowella Petties R, NP  Insulin Pen Needle (PEN NEEDLES) 32G X 6 MM MISC Use to inject insulin daily Patient taking differently: 1 each by Other route See admin instructions. Use to inject insulin daily 11/22/20   Shamleffer, Melanie Crazier, MD  meloxicam (MOBIC) 15 MG tablet Take 1 tablet (15 mg total) by mouth daily. Patient not taking: No sig reported 05/23/21   Hans Eden, NP  olopatadine (PATANOL) 0.1 % ophthalmic solution Place 1 drop into both eyes 2 (two) times daily. Patient not taking: No sig reported 12/25/18   Maryruth Hancock, MD  potassium chloride (KLOR-CON) 10 MEQ tablet TAKE 1 TABLET BY MOUTH EVERY DAY Patient not taking: No sig reported 09/04/20   Wendie Agreste, MD  rizatriptan (MAXALT) 10 MG tablet Take 1 tablet (10 mg total) by mouth 3 (three) times daily as needed for migraine. 01/18/21   Suzzanne Cloud, NP  triamcinolone (KENALOG) 0.1 % Apply 1 application topically 2 (two) times daily. To affected areas on face, scalp as needed. Patient not taking: No sig reported 06/14/20   Wendie Agreste, MD    Allergies    Ginger, Passion fruit flavor [flavoring agent], Shellfish allergy, Kiwi  extract, Other, and Tamiflu [oseltamivir phosphate]  Review of Systems   Review of Systems  Constitutional:  Positive for appetite change. Negative for activity change, chills, fatigue and fever.  HENT: Negative.    Respiratory: Negative.    Gastrointestinal:  Positive for nausea. Negative for blood in stool, constipation, diarrhea and vomiting.  Genitourinary:  Positive for flank pain. Negative for decreased urine volume, difficulty urinating, dysuria, enuresis, urgency, vaginal bleeding, vaginal discharge and vaginal pain.  Skin:  Negative.   Neurological: Negative.    Physical Exam Updated Vital Signs BP 127/90 (BP Location: Right Arm)   Pulse 86   Temp 98.3 F (36.8 C) (Oral)   Resp 16   SpO2 97%   Physical Exam Vitals and nursing note reviewed.  Constitutional:      General: She is not in acute distress.    Appearance: She is not ill-appearing or toxic-appearing.  HENT:     Head: Normocephalic and atraumatic.     Nose: Nose normal. No congestion.     Mouth/Throat:     Mouth: Mucous membranes are moist.     Pharynx: No oropharyngeal exudate or posterior oropharyngeal erythema.  Eyes:     General: Lids are normal. Vision grossly intact.        Right eye: No discharge.        Left eye: No discharge.     Extraocular Movements: Extraocular movements intact.     Conjunctiva/sclera: Conjunctivae normal.     Pupils: Pupils are equal, round, and reactive to light.  Cardiovascular:     Rate and Rhythm: Normal rate and regular rhythm.     Pulses: Normal pulses.     Heart sounds: Normal heart sounds. No murmur heard. Pulmonary:     Effort: Pulmonary effort is normal. No tachypnea, prolonged expiration or respiratory distress.     Breath sounds: Normal breath sounds. No wheezing or rales.  Chest:     Chest wall: No mass, lacerations, deformity, swelling, tenderness or crepitus.  Abdominal:     General: Bowel sounds are normal. There is no distension.     Palpations: Abdomen  is soft.     Tenderness: There is no abdominal tenderness. There is right CVA tenderness. There is no left CVA tenderness, guarding or rebound.  Musculoskeletal:        General: No deformity.     Cervical back: Normal range of motion and neck supple. No edema, rigidity, tenderness or crepitus. No pain with movement or spinous process tenderness.     Right lower leg: No edema.     Left lower leg: No edema.  Lymphadenopathy:     Cervical: No cervical adenopathy.  Skin:    General: Skin is warm and dry.     Capillary Refill: Capillary refill takes less than 2 seconds.  Neurological:     General: No focal deficit present.     Mental Status: She is alert and oriented to person, place, and time. Mental status is at baseline.     Gait: Gait is intact.  Psychiatric:        Mood and Affect: Mood normal.    ED Results / Procedures / Treatments   Labs (all labs ordered are listed, but only abnormal results are displayed) Labs Reviewed  COMPREHENSIVE METABOLIC PANEL - Abnormal; Notable for the following components:      Result Value   Potassium 3.3 (*)    Glucose, Bld 164 (*)    Calcium 8.6 (*)    Total Protein 6.3 (*)    Albumin 3.4 (*)    AST 14 (*)    All other components within normal limits  URINALYSIS, ROUTINE W REFLEX MICROSCOPIC - Abnormal; Notable for the following components:   APPearance HAZY (*)    Leukocytes,Ua SMALL (*)    Bacteria, UA RARE (*)    All other components within normal limits  URINE CULTURE  CBC WITH DIFFERENTIAL/PLATELET  PREGNANCY, URINE    EKG None  Radiology No  results found.  Procedures Procedures   Medications Ordered in ED Medications  ondansetron (ZOFRAN-ODT) disintegrating tablet 4 mg (4 mg Oral Given 06/11/21 1553)  ketorolac (TORADOL) 15 MG/ML injection 15 mg (15 mg Intravenous Given 06/11/21 1553)  morphine 4 MG/ML injection 4 mg (4 mg Intravenous Given 06/11/21 1553)  morphine 4 MG/ML injection 4 mg (4 mg Intravenous Given 06/11/21  1834)  ondansetron (ZOFRAN) injection 4 mg (4 mg Intravenous Given 06/11/21 1833)  ketorolac (TORADOL) 15 MG/ML injection 15 mg (15 mg Intravenous Given 06/11/21 2118)  ondansetron (ZOFRAN) injection 4 mg (4 mg Intravenous Given 06/11/21 2118)    ED Course  I have reviewed the triage vital signs and the nursing notes.  Pertinent labs & imaging results that were available during my care of the patient were reviewed by me and considered in my medical decision making (see chart for details).    MDM Rules/Calculators/A&P                         50 year old female with known right-sided nephrolithiasis who presents today with worsening flank pain and new nausea.  No fevers or chills at home.  Differential diagnosis includes but is limited to symptoms secondary to her known nephrolithiasis, new urinary tract infection in addition to her nephrolithiasis/pyelonephrosis, acute obstruction of the right kidney, hydronephrosis.  Hypertensive and mildly tachycardic on intake but afebrile.  Vital signs otherwise normal.  Cardiopulmonary exam is normal, abdominal exam with right-sided CVA tenderness and mild pressure to palpation in the right lower quadrant without suprapubic tenderness to palpation.  CBC unremarkable, CMP with mild hypokalemia of 3.3 but otherwise unremarkable with normal kidney function.  UA without signs consistent with infection.  Pregnancy test is negative.  Patient reevaluated after administration of multiple rounds of antiemetic and analgesia with significant improvement in her pain but 1 episode of vomiting while in the department.  States that overall her nausea is much improved at this time however.  Given reassuring work-up today without acute signs of infection and normal vital signs after pain management, do not feel any further work-up is warranted in the ER at this time.  Suspect her symptoms are secondary to her known nephrolithiasis.  Will discharge with prescription for  oxycodone and Zofran with recommendation to follow-up in the outpatient setting with her [facility] urologist this week.  Rudine voiced understanding of her medical evaluation and treatment plan.  Each of her questions was answered to her expressed satisfaction.  Strict return precautions were given.  Patient is well-appearing, stable, and appropriate for discharge at this time.  This chart was dictated using voice recognition software, Dragon. Despite the best efforts of this provider to proofread and correct errors, errors may still occur which can change documentation meaning.   Final Clinical Impression(s) / ED Diagnoses Final diagnoses:  Nephrolithiasis    Rx / DC Orders ED Discharge Orders          Ordered    oxyCODONE-acetaminophen (PERCOCET/ROXICET) 5-325 MG tablet  Every 6 hours PRN        06/11/21 2041    ondansetron (ZOFRAN ODT) 4 MG disintegrating tablet  Every 8 hours PRN        06/11/21 2041             Emeline Darling, PA-C 06/12/21 1359    Wyvonnia Dusky, MD 06/12/21 [phone]

## 2021-06-11 NOTE — ED Triage Notes (Signed)
Pt reports R flank pain and R sided abd pain.  States she was seen at [facility] on Monday and diagnosed with kidney stone.  Took Tramadol 1.5 hours ago.  Denies dysuria and hematuria.

## 2021-06-11 NOTE — Discharge Instructions (Addendum)
You were seen in the ER today for your flank pain and nausea.  Your blood work and urine tests were very reassuring.  Suspect your pain is secondary to your known kidney stone.  Please continue to take the Flomax previously prescribed to you and begin to use the pain medication and nausea medication prescribed to you today as needed.  Please follow-up with your urologist in the next week and return to the ER with any fever, chills, nausea or vomiting does not stop, difficulty urinating or cessation of your urine, or any other new severe symptoms

## 2021-06-12 ENCOUNTER — Other Ambulatory Visit: Payer: Self-pay

## 2021-06-12 ENCOUNTER — Emergency Department (HOSPITAL_COMMUNITY): Payer: BLUE CROSS/BLUE SHIELD

## 2021-06-12 ENCOUNTER — Emergency Department (HOSPITAL_COMMUNITY)
Admission: EM | Admit: 2021-06-12 | Discharge: 2021-06-12 | Disposition: A | Discharge status: Home / Self Care | Payer: BLUE CROSS/BLUE SHIELD | Attending: Emergency Medicine | Admitting: Emergency Medicine

## 2021-06-12 ENCOUNTER — Encounter (HOSPITAL_COMMUNITY): Payer: Self-pay | Admitting: Emergency Medicine

## 2021-06-12 DIAGNOSIS — Z7984 Long term (current) use of oral hypoglycemic drugs: Secondary | ICD-10-CM | POA: Diagnosis not present

## 2021-06-12 DIAGNOSIS — R300 Dysuria: Secondary | ICD-10-CM | POA: Insufficient documentation

## 2021-06-12 DIAGNOSIS — E1142 Type 2 diabetes mellitus with diabetic polyneuropathy: Secondary | ICD-10-CM | POA: Diagnosis not present

## 2021-06-12 DIAGNOSIS — Z794 Long term (current) use of insulin: Secondary | ICD-10-CM | POA: Insufficient documentation

## 2021-06-12 DIAGNOSIS — Z7951 Long term (current) use of inhaled steroids: Secondary | ICD-10-CM | POA: Insufficient documentation

## 2021-06-12 DIAGNOSIS — J4530 Mild persistent asthma, uncomplicated: Secondary | ICD-10-CM | POA: Insufficient documentation

## 2021-06-12 DIAGNOSIS — R109 Unspecified abdominal pain: Secondary | ICD-10-CM | POA: Insufficient documentation

## 2021-06-12 DIAGNOSIS — I10 Essential (primary) hypertension: Secondary | ICD-10-CM | POA: Diagnosis not present

## 2021-06-12 LAB — CBC WITH DIFFERENTIAL/PLATELET
Abs Immature Granulocytes: 0.04 10*3/uL (ref 0.00–0.07)
Basophils Absolute: 0 10*3/uL (ref 0.0–0.1)
Basophils Relative: 0 %
Eosinophils Absolute: 0.1 10*3/uL (ref 0.0–0.5)
Eosinophils Relative: 1 %
HCT: 41 % (ref 36.0–46.0)
Hemoglobin: 13.2 g/dL (ref 12.0–15.0)
Immature Granulocytes: 0 %
Lymphocytes Relative: 20 %
Lymphs Abs: 2.5 10*3/uL (ref 0.7–4.0)
MCH: 31.1 pg (ref 26.0–34.0)
MCHC: 32.2 g/dL (ref 30.0–36.0)
MCV: 96.7 fL (ref 80.0–100.0)
Monocytes Absolute: 0.8 10*3/uL (ref 0.1–1.0)
Monocytes Relative: 6 %
Neutro Abs: 9 10*3/uL — ABNORMAL HIGH (ref 1.7–7.7)
Neutrophils Relative %: 73 %
Platelets: 330 10*3/uL (ref 150–400)
RBC: 4.24 MIL/uL (ref 3.87–5.11)
RDW: 13.5 % (ref 11.5–15.5)
WBC: 12.4 10*3/uL — ABNORMAL HIGH (ref 4.0–10.5)
nRBC: 0 % (ref 0.0–0.2)

## 2021-06-12 LAB — COMPREHENSIVE METABOLIC PANEL
ALT: 15 U/L (ref 0–44)
AST: 16 U/L (ref 15–41)
Albumin: 3.4 g/dL — ABNORMAL LOW (ref 3.5–5.0)
Alkaline Phosphatase: 74 U/L (ref 38–126)
Anion gap: 9 (ref 5–15)
BUN: 9 mg/dL (ref 6–20)
CO2: 27 mmol/L (ref 22–32)
Calcium: 8.8 mg/dL — ABNORMAL LOW (ref 8.9–10.3)
Chloride: 101 mmol/L (ref 98–111)
Creatinine, Ser: 1.07 mg/dL — ABNORMAL HIGH (ref 0.44–1.00)
GFR, Estimated: >60 mL/min (ref >60)
Glucose, Bld: 144 mg/dL — ABNORMAL HIGH (ref 70–99)
Potassium: 3.3 mmol/L — ABNORMAL LOW (ref 3.5–5.1)
Sodium: 137 mmol/L (ref 135–145)
Total Bilirubin: 0.7 mg/dL (ref 0.3–1.2)
Total Protein: 6.4 g/dL — ABNORMAL LOW (ref 6.5–8.1)

## 2021-06-12 LAB — URINALYSIS, ROUTINE W REFLEX MICROSCOPIC
Bilirubin Urine: NEGATIVE
Glucose, UA: NEGATIVE mg/dL
Hgb urine dipstick: NEGATIVE
Ketones, ur: NEGATIVE mg/dL
Leukocytes,Ua: NEGATIVE
Nitrite: NEGATIVE
Protein, ur: NEGATIVE mg/dL
Specific Gravity, Urine: 1.006 (ref 1.005–1.030)
pH: 7 (ref 5.0–8.0)

## 2021-06-12 LAB — MAGNESIUM: Magnesium: 1.9 mg/dL (ref 1.7–2.4)

## 2021-06-12 LAB — LIPASE, BLOOD: Lipase: 24 U/L (ref 11–51)

## 2021-06-12 MED ORDER — DIPHENHYDRAMINE HCL 50 MG/ML IJ SOLN
25.0000 mg | Freq: Once | INTRAMUSCULAR | Status: AC
  Administered 2021-06-12: 25 mg via INTRAVENOUS
  Filled 2021-06-12: qty 1

## 2021-06-12 MED ORDER — KETOROLAC TROMETHAMINE 15 MG/ML IJ SOLN
15.0000 mg | Freq: Once | INTRAMUSCULAR | Status: AC
  Administered 2021-06-12: 15 mg via INTRAVENOUS
  Filled 2021-06-12: qty 1

## 2021-06-12 MED ORDER — METOCLOPRAMIDE HCL 5 MG/ML IJ SOLN
10.0000 mg | Freq: Once | INTRAMUSCULAR | Status: AC
  Administered 2021-06-12: 10 mg via INTRAVENOUS
  Filled 2021-06-12: qty 2

## 2021-06-12 MED ORDER — LACTATED RINGERS IV BOLUS
1000.0000 mL | Freq: Once | INTRAVENOUS | Status: AC
  Administered 2021-06-12: 1000 mL via INTRAVENOUS

## 2021-06-12 NOTE — ED Notes (Signed)
Labs drawn sent to lab for save. Urine collected also

## 2021-06-12 NOTE — ED Triage Notes (Signed)
Pt states she was diagnosed with kidney stones on Tuesday.  Seen in ED yesterday.  Reports R hip/buttocks pain with nausea and vomiting.  Denies dysuria/hematuria.

## 2021-06-12 NOTE — ED Notes (Signed)
Pt stated she went to the bathroom and complains pain with urination. Stated "It was painful."

## 2021-06-12 NOTE — Discharge Instructions (Signed)
Follow-up with your primary care doctor for results of urine cultures that were sent yesterday.  Continue to drink plenty fluids to avoid future kidney stones.  Return to the ED for any worsening symptoms.

## 2021-06-12 NOTE — ED Provider Notes (Signed)
[facility] EMERGENCY DEPARTMENT Provider Note   CSN: [id number] Arrival date & time: 06/12/21  0749     History Chief Complaint  Patient presents with   Nephrolithiasis    Sandra Moody is a 50 y.o. female.  HPI Patient presents for painful urination.  Seen in the ED yesterday for right-sided flank and abdominal pain.  Prior to that, she was seen at outside hospital earlier in the week.  On Wednesday (4 days ago), she underwent a CT scan.  CT scan showed small calcifications along distal right ureter measuring up to 5 mm.  There was mild asymmetric right perinephric stranding and minimal hydronephrosis, raising concern of obstructing uropathy.  She was seen by urology 2 days ago.  She was advised to continue Flomax for 2 weeks and follow-up.  Today, she reports onset of vomiting last night after she left the ED.  She was vomiting throughout the night.  She did take Zofran and has had resolution of vomiting this morning.  She continues to feel nauseous.  She also has a persistent headache, onset of which was yesterday.  She reports that when she urinates, she has had pain in her urethral area, right groin, as well as the right side of her back.  She has not had fevers or chills.    Past Medical History:  Diagnosis Date   Acute renal disease    Asthma    Bilateral carpal tunnel syndrome 03/11/2019   Common migraine with intractable migraine 08/29/2018   Daily headache    (03/08/2017)   Essential hypertension 02/20/2019   GERD (gastroesophageal reflux disease)    H pylori ulcer ~ 08/2016   History of hiatal hernia    Leaky heart valve ~ 09/2016   Migraine    "maybe weekly" (03/08/2017)   PONV (postoperative nausea and vomiting)    Type 2 diabetes mellitus with hyperglycemia, without long-term current use of insulin ([place]) 02/20/2019    Patient Active Problem List   Diagnosis Date Noted   Entrapment of left ulnar nerve 04/28/2020   Diabetic polyneuropathy associated  with type 2 diabetes mellitus ([place]) 03/05/2020   Type 2 diabetes mellitus without complication, without long-term current use of insulin ([place]) 09/05/2019   Trigger finger of left thumb 04/30/2019   Infection of flexor tendon sheath 04/15/2019   Carpal tunnel syndrome, bilateral 03/11/2019   Essential hypertension 02/20/2019   Type 2 diabetes mellitus with hyperglycemia, without long-term current use of insulin ([place]) 02/20/2019   Other acute sinusitis 12/25/2018   Irritable bowel syndrome 12/04/2018   Migraine with aura 12/04/2018   Nonintractable common migraine 08/29/2018   Moderate persistent asthma without complication 67/67/2094   Seasonal and perennial allergic rhinitis 06/28/2017   Mild persistent asthma with acute exacerbation 06/28/2017   Gastroesophageal reflux disease 06/28/2017   Biliary dyskinesia 03/08/2017   Asthma 03/02/2016   Asthma with acute exacerbation 10/04/2015   Other and unspecified ovarian cyst 06/17/2012    Past Surgical History:  Procedure Laterality Date   CARPAL TUNNEL RELEASE Left 12/18/2019   Procedure: CARPAL TUNNEL RELEASE;  Surgeon: [name], MD;  Location: [place];  Service: Orthopedics;  Laterality: Left;   CARPAL TUNNEL RELEASE Right 07/22/2020   Procedure: CARPAL TUNNEL RELEASE RIGHT;  Surgeon: [name], MD;  Location: [place];  Service: Orthopedics;  Laterality: Right;   CHOLECYSTECTOMY N/A 03/08/2017   Procedure: LAPAROSCOPIC CHOLECYSTECTOMY;  Surgeon: Coralie Keens, MD;  Location: [place];  Service: General;  Laterality: N/A;   DILATION AND CURETTAGE OF UTERUS     LAPAROSCOPIC CHOLECYSTECTOMY  03/08/2017   SHOULDER ARTHROSCOPY WITH SUBACROMIAL DECOMPRESSION, ROTATOR CUFF REPAIR AND BICEP TENDON REPAIR Right 07/22/2020   Procedure: SHOULDER ARTHROSCOPY WITH SUBACROMIAL DECOMPRESSION AND DISTAL CLAVICLE EXCISION, ROTATOR CUFF REPAIR AND BICEPS TENODESIS;  Surgeon: Hiram Gash, MD;  Location: [place];  Service: Orthopedics;  Laterality: Right;   TRIGGER FINGER RELEASE Left 12/18/2019   Procedure: RELEASE TRIGGER FINGER/A-1 PULLEY;  Surgeon: [name], MD;  Location: [place];  Service: Orthopedics;  Laterality: Left;   TUBAL LIGATION       OB History     Gravida  5   Para  3   Term  3   Preterm  0   AB  2   Living         SAB  1   IAB  1   Ectopic  0   Multiple      Live Births              Family History  Problem Relation Age of Onset   Thyroid disease Mother    Diabetes Maternal Grandmother    Heart disease Maternal Grandfather     Social History   Tobacco Use   Smoking status: Never   Smokeless tobacco: Never  Vaping Use   Vaping Use: Never used  Substance Use Topics   Alcohol use: Not Currently    Comment: 03/08/2017 "might have a drink on a holiday"   Drug use: No    Home Medications Prior to Admission medications   Medication Sig Start Date End Date Taking? Authorizing Provider  Accu-Chek FastClix Lancets MISC USE TO CHECK GLUCOSE TWICE A DAY ,FASTING AND 2 HOURS AFTER DINNER Patient taking differently: 1 each by Other route See admin instructions. USE TO CHECK GLUCOSE TWICE A DAY ,FASTING AND 2 HOURS AFTER DINNER 08/30/20  Yes Shamleffer, Melanie Crazier, MD  ACCU-CHEK GUIDE test strip USE TO CHECK GLUCOSE TWICE DAILY,FASTING AND 2 HOURS AFTER DINNER Patient taking differently: 1 each by Other route See admin instructions. USE TO CHECK GLUCOSE TWICE DAILY,FASTING AND 2 HOURS AFTER DINNER 05/09/20  Yes Jacelyn Pi, Irma M, MD  Accu-Chek Softclix Lancets lancets Use as instructed to check blood sugar 2 times a day Patient taking differently: 1 each by Other route See admin instructions. Use as instructed to check blood sugar 2 times a day 08/27/20  Yes Shamleffer, Melanie Crazier, MD  albuterol (PROVENTIL) (2.5 MG/3ML) 0.083% nebulizer solution Take 3 mLs (2.5 mg total) by nebulization every 6 (six) hours as  needed for wheezing or shortness of breath. 11/13/19  Yes Wendie Agreste, MD  azelastine (OPTIVAR) 0.05 % ophthalmic solution Place 1 drop into both eyes daily.   Yes [provider]  BIOTIN PO Take 1 tablet by mouth daily.   Yes [provider]  blood glucose meter kit and supplies Per insurance preference. Use to check glucose twice a day (fasting and 2 hours after dinner)  Dx E11.65 Patient taking differently: 1 each by Other route See admin instructions. Per insurance preference. Use to check glucose twice a day (fasting and 2 hours after dinner)  Dx E11.65 10/30/18  Yes Jacelyn Pi, Irma M, MD  Galcanezumab-gnlm [facility]) 120 MG/ML SOAJ Inject 120 mg into the skin every 30 (thirty) days. 01/18/21  Yes Suzzanne Cloud, NP  Insulin Glargine-yfgn (SEMGLEE, YFGN,) 100 UNIT/ML SOLN Inject 40 Units into the  skin daily at 2 PM. Patient taking differently: Inject 24 Units into the skin daily at 2 PM. 08/27/20  Yes Shamleffer, Melanie Crazier, MD  Insulin Pen Needle (PEN NEEDLES) 32G X 6 MM MISC Use to inject insulin daily Patient taking differently: 1 each by Other route See admin instructions. Use to inject insulin daily 11/22/20  Yes Shamleffer, Melanie Crazier, MD  ketoconazole (NIZORAL) 2 % cream Apply 1 application topically daily. To seborrhea/face lesion as needed. 06/14/20  Yes Wendie Agreste, MD  ketoconazole (NIZORAL) 2 % shampoo Apply 1 application topically 2 (two) times a week. Apply to affected area lather, leave in place for 5 mins and then rinse off with water 01/15/20  Yes Jacelyn Pi, Lilia Argue, MD  levocetirizine (XYZAL) 5 MG tablet Take 5 mg by mouth daily. 11/05/18  Yes [provider]  medroxyPROGESTERone (DEPO-PROVERA) 150 MG/ML injection Inject 150 mg into the muscle every 3 (three) months. 08/15/18  Yes [provider]  ondansetron (ZOFRAN ODT) 4 MG disintegrating tablet Take 1 tablet (4 mg total) by mouth every 8 (eight) hours as needed for  nausea or vomiting. 06/11/21  Yes Sponseller, Gypsy Balsam, PA-C  oxyCODONE-acetaminophen (PERCOCET/ROXICET) 5-325 MG tablet Take 1 tablet by mouth every 6 (six) hours as needed for severe pain. 06/11/21  Yes Sponseller, Rebekah R, PA-C  SEMGLEE, YFGN, 100 UNIT/ML Pen Inject 24 Units into the skin daily. 05/17/21  Yes [provider]  sitaGLIPtin (JANUVIA) 100 MG tablet Take 1 tablet (100 mg total) by mouth daily. 04/21/20  Yes Shamleffer, Melanie Crazier, MD  SYMBICORT 160-4.5 MCG/ACT inhaler INHALE 2 PUFFS INTO THE LUNGS DAILY. Patient taking differently: Inhale 2 puffs into the lungs daily. 05/09/18  Yes Valentina Shaggy, MD  tamsulosin (FLOMAX) 0.4 MG CAPS capsule Take 0.4 mg by mouth daily. 06/09/21  Yes [provider]  clobetasol ointment (TEMOVATE) 0.05 % APPLY TOPICALLY TWICE A DAY AS NEEDED FOR ITCHY RASH AS NEEDED Patient not taking: No sig reported 06/28/20   Wendie Agreste, MD  cyclobenzaprine (FLEXERIL) 10 MG tablet Take 1 tablet (10 mg total) by mouth at bedtime. Patient not taking: No sig reported 05/23/21   Hans Eden, NP  meloxicam (MOBIC) 15 MG tablet Take 1 tablet (15 mg total) by mouth daily. Patient not taking: No sig reported 05/23/21   Hans Eden, NP  olopatadine (PATANOL) 0.1 % ophthalmic solution Place 1 drop into both eyes 2 (two) times daily. Patient not taking: No sig reported 12/25/18   Maryruth Hancock, MD  potassium chloride (KLOR-CON) 10 MEQ tablet TAKE 1 TABLET BY MOUTH EVERY DAY Patient not taking: No sig reported 09/04/20   Wendie Agreste, MD  rizatriptan (MAXALT) 10 MG tablet Take 1 tablet (10 mg total) by mouth 3 (three) times daily as needed for migraine. 01/18/21   Suzzanne Cloud, NP  triamcinolone (KENALOG) 0.1 % Apply 1 application topically 2 (two) times daily. To affected areas on face, scalp as needed. Patient not taking: No sig reported 06/14/20   Wendie Agreste, MD    Allergies    Ginger, Passion fruit flavor  [flavoring agent], Shellfish allergy, Kiwi extract, Other, and Tamiflu [oseltamivir phosphate]  Review of Systems   Review of Systems  Constitutional:  Positive for appetite change. Negative for chills and fever.  HENT:  Negative for congestion, ear pain and sore throat.   Eyes:  Negative for pain and visual disturbance.  Respiratory:  Negative for cough and shortness of breath.  Cardiovascular:  Negative for chest pain and palpitations.  Gastrointestinal:  Positive for abdominal pain, nausea and vomiting.  Genitourinary:  Positive for dysuria and flank pain. Negative for hematuria.  Musculoskeletal:  Positive for back pain. Negative for arthralgias and neck pain.  Skin:  Negative for color change and rash.  Neurological:  Positive for headaches. Negative for dizziness, seizures, syncope, weakness and numbness.  Hematological:  Does not bruise/bleed easily.  Psychiatric/Behavioral:  Negative for confusion and decreased concentration.   All other systems reviewed and are negative.  Physical Exam Updated Vital Signs BP 137/84 (BP Location: Right Arm)   Pulse 90   Temp 98 F (36.7 C) (Oral)   Resp 14   SpO2 100%   Physical Exam Vitals and nursing note reviewed.  Constitutional:      General: She is not in acute distress.    Appearance: Normal appearance. She is well-developed and normal weight. She is not ill-appearing, toxic-appearing or diaphoretic.  HENT:     Head: Normocephalic and atraumatic.     Right Ear: External ear normal.     Left Ear: External ear normal.     Nose: Nose normal.     Mouth/Throat:     Mouth: Mucous membranes are moist.     Pharynx: Oropharynx is clear.  Eyes:     Conjunctiva/sclera: Conjunctivae normal.  Cardiovascular:     Rate and Rhythm: Normal rate and regular rhythm.     Heart sounds: No murmur heard. Pulmonary:     Effort: Pulmonary effort is normal. No respiratory distress.     Breath sounds: Normal breath sounds.  Abdominal:      Palpations: Abdomen is soft.     Tenderness: There is no abdominal tenderness. There is no right CVA tenderness or left CVA tenderness.  Musculoskeletal:        General: No tenderness. Normal range of motion.     Cervical back: Normal range of motion and neck supple.     Right lower leg: No edema.     Left lower leg: No edema.  Skin:    General: Skin is warm and dry.     Capillary Refill: Capillary refill takes less than 2 seconds.     Coloration: Skin is not jaundiced or pale.  Neurological:     General: No focal deficit present.     Mental Status: She is alert and oriented to person, place, and time.     Cranial Nerves: No cranial nerve deficit.     Sensory: No sensory deficit.     Motor: No weakness.  Psychiatric:        Mood and Affect: Mood normal.        Behavior: Behavior normal.        Thought Content: Thought content normal.        Judgment: Judgment normal.    ED Results / Procedures / Treatments   Labs (all labs ordered are listed, but only abnormal results are displayed) Labs Reviewed  URINALYSIS, ROUTINE W REFLEX MICROSCOPIC - Abnormal; Notable for the following components:      Result Value   Color, Urine STRAW (*)    All other components within normal limits  CBC WITH DIFFERENTIAL/PLATELET - Abnormal; Notable for the following components:   WBC 12.4 (*)    Neutro Abs 9.0 (*)    All other components within normal limits  COMPREHENSIVE METABOLIC PANEL - Abnormal; Notable for the following components:   Potassium 3.3 (*)    Glucose, Bld  144 (*)    Creatinine, Ser 1.07 (*)    Calcium 8.8 (*)    Total Protein 6.4 (*)    Albumin 3.4 (*)    All other components within normal limits  LIPASE, BLOOD  MAGNESIUM    EKG None  Radiology CT Renal Stone Study  Result Date: 06/12/2021 CLINICAL DATA:  Flank pain. EXAM: CT ABDOMEN AND PELVIS WITHOUT CONTRAST TECHNIQUE: Multidetector CT imaging of the abdomen and pelvis was performed following the standard protocol  without IV contrast. COMPARISON:  May 11, 2017 FINDINGS: Lower chest: No acute abnormality. Hepatobiliary: No focal liver abnormality is seen. Status post cholecystectomy. No biliary dilatation. Pancreas: Unremarkable. No pancreatic ductal dilatation or surrounding inflammatory changes. Spleen: Normal in size without focal abnormality. Adrenals/Urinary Tract: There is right hydronephrosis without focal discrete obstructing stone. No kidney stones are identified bilaterally. The bilateral adrenal glands are normal. Bladder is normal. Stomach/Bowel: Stomach is within normal limits. Appendix appears normal. No evidence of bowel wall thickening, distention, or inflammatory changes. Vascular/Lymphatic: No significant vascular findings are present. No enlarged abdominal or pelvic lymph nodes. Reproductive: Uterus and bilateral adnexa are unremarkable. Other: Pelvic phlebolith are identified. Musculoskeletal: No acute abnormality. IMPRESSION: Right hydronephrosis without focal discrete obstructing stone identified. The findings could be due to recently passed stone or pyelonephritis. Electronically Signed   By: Abelardo Diesel M.D.   On: 06/12/2021 14:38    Procedures Procedures   Medications Ordered in ED Medications  lactated ringers bolus 1,000 mL (0 mLs Intravenous Stopped 06/12/21 1556)  metoCLOPramide (REGLAN) injection 10 mg (10 mg Intravenous Given 06/12/21 1237)  ketorolac (TORADOL) 15 MG/ML injection 15 mg (15 mg Intravenous Given 06/12/21 1239)  diphenhydrAMINE (BENADRYL) injection 25 mg (25 mg Intravenous Given 06/12/21 1235)    ED Course  I have reviewed the triage vital signs and the nursing notes.  Pertinent labs & imaging results that were available during my care of the patient were reviewed by me and considered in my medical decision making (see chart for details).    MDM Rules/Calculators/A&P                          Patient with known right-sided nephrolithiasis, presenting for  continued right flank and back pain, dysuria, as well as new onset of vomiting last night.  She is afebrile upon arrival in the ED.  On exam, she is well-appearing.  She does not have right-sided CVA tenderness.  She does endorse continued nausea and headache.  We will treat symptoms with Reglan, Benadryl, and Toradol.  Given her persistent vomiting overnight, will give IVF.  Given her worsening symptoms we will repeat CT scan and urinalysis.  When compared to yesterday, patient has developed a leukocytosis.  CT scan showed no evidence of stones.  Patient has likely passed her right-sided stones.  There is some right-sided hydronephrosis, which was present on a previous CT when stones were present.  On reassessment, patient reported improved symptoms.  She does endorse pain with urination.  Urinalysis today, however, did not show any evidence of infection.  Pain likely attributable to recently passed stone.  Urine cultures were sent yesterday and patient was advised to follow-up on results of these within the next 24 hours.  At this time, do not feel that she needs antibiotics.  She had improved flank pain while here in the ED.  She also reports complete resolution of her headache.  She has not had any further nausea.  Patient is stable for discharge at this time.  Final Clinical Impression(s) / ED Diagnoses Final diagnoses:  Right flank pain    Rx / DC Orders ED Discharge Orders     None        Godfrey Pick, MD 06/12/21 1656

## 2021-06-14 ENCOUNTER — Other Ambulatory Visit: Payer: Self-pay | Admitting: Internal Medicine

## 2021-06-14 LAB — URINE CULTURE: Culture: NO GROWTH

## 2021-06-15 ENCOUNTER — Emergency Department (HOSPITAL_COMMUNITY)
Admission: EM | Admit: 2021-06-15 | Discharge: 2021-06-16 | Disposition: A | Discharge status: Home / Self Care | Payer: BLUE CROSS/BLUE SHIELD | Attending: Emergency Medicine | Admitting: Emergency Medicine

## 2021-06-15 DIAGNOSIS — Z79899 Other long term (current) drug therapy: Secondary | ICD-10-CM | POA: Diagnosis not present

## 2021-06-15 DIAGNOSIS — E119 Type 2 diabetes mellitus without complications: Secondary | ICD-10-CM | POA: Insufficient documentation

## 2021-06-15 DIAGNOSIS — Z7952 Long term (current) use of systemic steroids: Secondary | ICD-10-CM | POA: Diagnosis not present

## 2021-06-15 DIAGNOSIS — I10 Essential (primary) hypertension: Secondary | ICD-10-CM | POA: Insufficient documentation

## 2021-06-15 DIAGNOSIS — R519 Headache, unspecified: Secondary | ICD-10-CM

## 2021-06-15 DIAGNOSIS — Z794 Long term (current) use of insulin: Secondary | ICD-10-CM | POA: Insufficient documentation

## 2021-06-15 DIAGNOSIS — J4531 Mild persistent asthma with (acute) exacerbation: Secondary | ICD-10-CM | POA: Insufficient documentation

## 2021-06-15 DIAGNOSIS — R030 Elevated blood-pressure reading, without diagnosis of hypertension: Secondary | ICD-10-CM

## 2021-06-15 LAB — URINALYSIS, ROUTINE W REFLEX MICROSCOPIC
Bilirubin Urine: NEGATIVE
Glucose, UA: NEGATIVE mg/dL
Hgb urine dipstick: NEGATIVE
Ketones, ur: NEGATIVE mg/dL
Leukocytes,Ua: NEGATIVE
Nitrite: NEGATIVE
Protein, ur: NEGATIVE mg/dL
Specific Gravity, Urine: 1.006 (ref 1.005–1.030)
pH: 7 (ref 5.0–8.0)

## 2021-06-15 LAB — COMPREHENSIVE METABOLIC PANEL
ALT: 15 U/L (ref 0–44)
AST: 17 U/L (ref 15–41)
Albumin: 3.4 g/dL — ABNORMAL LOW (ref 3.5–5.0)
Alkaline Phosphatase: 70 U/L (ref 38–126)
Anion gap: 10 (ref 5–15)
BUN: <5 mg/dL — ABNORMAL LOW (ref 6–20)
CO2: 25 mmol/L (ref 22–32)
Calcium: 9.3 mg/dL (ref 8.9–10.3)
Chloride: 102 mmol/L (ref 98–111)
Creatinine, Ser: 0.93 mg/dL (ref 0.44–1.00)
GFR, Estimated: >60 mL/min (ref >60)
Glucose, Bld: 159 mg/dL — ABNORMAL HIGH (ref 70–99)
Potassium: 3.6 mmol/L (ref 3.5–5.1)
Sodium: 137 mmol/L (ref 135–145)
Total Bilirubin: 0.3 mg/dL (ref 0.3–1.2)
Total Protein: 6.4 g/dL — ABNORMAL LOW (ref 6.5–8.1)

## 2021-06-15 LAB — CBC WITH DIFFERENTIAL/PLATELET
Abs Immature Granulocytes: 0.03 10*3/uL (ref 0.00–0.07)
Basophils Absolute: 0 10*3/uL (ref 0.0–0.1)
Basophils Relative: 0 %
Eosinophils Absolute: 0.2 10*3/uL (ref 0.0–0.5)
Eosinophils Relative: 2 %
HCT: 39.7 % (ref 36.0–46.0)
Hemoglobin: 12.7 g/dL (ref 12.0–15.0)
Immature Granulocytes: 0 %
Lymphocytes Relative: 26 %
Lymphs Abs: 2.7 10*3/uL (ref 0.7–4.0)
MCH: 30.5 pg (ref 26.0–34.0)
MCHC: 32 g/dL (ref 30.0–36.0)
MCV: 95.2 fL (ref 80.0–100.0)
Monocytes Absolute: 0.8 10*3/uL (ref 0.1–1.0)
Monocytes Relative: 7 %
Neutro Abs: 6.5 10*3/uL (ref 1.7–7.7)
Neutrophils Relative %: 65 %
Platelets: 313 10*3/uL (ref 150–400)
RBC: 4.17 MIL/uL (ref 3.87–5.11)
RDW: 13.2 % (ref 11.5–15.5)
WBC: 10.3 10*3/uL (ref 4.0–10.5)
nRBC: 0 % (ref 0.0–0.2)

## 2021-06-15 LAB — ETHANOL: Alcohol, Ethyl (B): <10 mg/dL (ref <10)

## 2021-06-15 LAB — I-STAT BETA HCG BLOOD, ED (MC, WL, AP ONLY): I-stat hCG, quantitative: <5 m[IU]/mL (ref <5)

## 2021-06-15 NOTE — ED Triage Notes (Signed)
Pt c/o HTN x1 day, states no prior hx of same- hx documented in 2020. Pt endorses HA- took rizatriptan, slight tingling in L hand, denies CP, SHOB. Kidney stones that she states passed but still issues w pain.

## 2021-06-15 NOTE — ED Provider Notes (Signed)
Emergency Medicine Provider Triage Evaluation Note  Sandra Moody , a 50 y.o. female  was evaluated in triage.  Pt complains of headache and hypertension.  She has a history of migraines, and is currently experiencing a headache that is location and similar to prior, but duration was different and that is lingering for a prolonged period.  This has been going on for about 2 days.  Over the past days she has noticed her blood pressure is elevated in spite of taking amlodipine.  No focal weakness in her extremities, no chest pain, no dyspnea.  She has a notable history of nephrolithiasis diagnosed a few days ago.  She continues to have dysuria similar to that she experienced prior to the diagnosis.  Review of Systems  Positive: As above Negative: No chest pain, no dyspnea, no vomiting, no diarrhea  Physical Exam  BP (!) 142/93   Pulse (!) 104   Temp 98.9 F (37.2 C)   Resp 12   SpO2 100%  Gen:   Awake, no distress speaking clearly Resp:  Normal effort no increased work of breathing MSK:   Moves extremities without difficulty no deformities Other:  Psych: Normal  Medical Decision Making  Medically screening exam initiated at 9:46 PM.  Appropriate orders placed.  Sandra Moody was informed that the remainder of the evaluation will be completed by another provider, this initial triage assessment does not replace that evaluation, and the importance of remaining in the ED until their evaluation is complete.   Gerhard Munch, MD 06/15/21 2147

## 2021-06-16 ENCOUNTER — Other Ambulatory Visit: Payer: Self-pay

## 2021-06-16 MED ORDER — DIPHENHYDRAMINE HCL 50 MG/ML IJ SOLN
25.0000 mg | Freq: Once | INTRAMUSCULAR | Status: AC
  Administered 2021-06-16: 07:00:00 25 mg via INTRAVENOUS
  Filled 2021-06-16: qty 1

## 2021-06-16 MED ORDER — PROCHLORPERAZINE EDISYLATE 10 MG/2ML IJ SOLN
10.0000 mg | Freq: Once | INTRAMUSCULAR | Status: AC
  Administered 2021-06-16: 07:00:00 10 mg via INTRAVENOUS
  Filled 2021-06-16: qty 2

## 2021-06-16 NOTE — Discharge Instructions (Addendum)
Your work-up was negative today.  Would like for you to follow-up with your PCP for reevaluation for hypertension.  Please return to the emergency department if you experience trouble walking, weakness/numbness to the upper and lower extremities, chest pain, shortness of breath, loss of consciousness, or any other concerns you may have.

## 2021-06-16 NOTE — ED Provider Notes (Signed)
[facility] EMERGENCY DEPARTMENT Provider Note   CSN: [id number] Arrival date & time: 06/15/21  2056     History Chief Complaint  Patient presents with   Hypertension    Sandra Moody is a 50 y.o. female with history of hypertension and migraines who presents to the emergency department with elevated blood pressures over the last few days.  Patient was recently seen and evaluated in the emergency department on 06/12/2021 for kidney stone.  She is still having symptoms from that including flank pain and urinary symptoms.  With regards to her blood pressure she has been complaining of global headache with pressure behind her eyes.  Headache is intermittent.  She used to be taking blood pressure medications which she weaned herself off as her pressures were looking better.  She has not been on any antihypertensives in months.  She denies any focal weakness in her extremities, chest pain, shortness of breath. No blurred vision.    Hypertension      Past Medical History:  Diagnosis Date   Acute renal disease    Asthma    Bilateral carpal tunnel syndrome 03/11/2019   Common migraine with intractable migraine 08/29/2018   Daily headache    (03/08/2017)   Essential hypertension 02/20/2019   GERD (gastroesophageal reflux disease)    H pylori ulcer ~ 08/2016   History of hiatal hernia    Leaky heart valve ~ 09/2016   Migraine    "maybe weekly" (03/08/2017)   PONV (postoperative nausea and vomiting)    Type 2 diabetes mellitus with hyperglycemia, without long-term current use of insulin ([place]) 02/20/2019    Patient Active Problem List   Diagnosis Date Noted   Entrapment of left ulnar nerve 04/28/2020   Diabetic polyneuropathy associated with type 2 diabetes mellitus ([place]) 03/05/2020   Type 2 diabetes mellitus without complication, without long-term current use of insulin ([place]) 09/05/2019   Trigger finger of left thumb 04/30/2019   Infection of flexor tendon sheath  04/15/2019   Carpal tunnel syndrome, bilateral 03/11/2019   Essential hypertension 02/20/2019   Type 2 diabetes mellitus with hyperglycemia, without long-term current use of insulin ([place]) 02/20/2019   Other acute sinusitis 12/25/2018   Irritable bowel syndrome 12/04/2018   Migraine with aura 12/04/2018   Nonintractable common migraine 08/29/2018   Moderate persistent asthma without complication 00/86/7619   Seasonal and perennial allergic rhinitis 06/28/2017   Mild persistent asthma with acute exacerbation 06/28/2017   Gastroesophageal reflux disease 06/28/2017   Biliary dyskinesia 03/08/2017   Asthma 03/02/2016   Asthma with acute exacerbation 10/04/2015   Other and unspecified ovarian cyst 06/17/2012    Past Surgical History:  Procedure Laterality Date   CARPAL TUNNEL RELEASE Left 12/18/2019   Procedure: CARPAL TUNNEL RELEASE;  Surgeon: [name], MD;  Location: [facility];  Service: Orthopedics;  Laterality: Left;   CARPAL TUNNEL RELEASE Right 07/22/2020   Procedure: CARPAL TUNNEL RELEASE RIGHT;  Surgeon: [name], MD;  Location: [facility];  Service: Orthopedics;  Laterality: Right;   CHOLECYSTECTOMY N/A 03/08/2017   Procedure: LAPAROSCOPIC CHOLECYSTECTOMY;  Surgeon: Coralie Keens, MD;  Location: [facility];  Service: General;  Laterality: N/A;   DILATION AND CURETTAGE OF UTERUS     LAPAROSCOPIC CHOLECYSTECTOMY  03/08/2017   SHOULDER ARTHROSCOPY WITH SUBACROMIAL DECOMPRESSION, ROTATOR CUFF REPAIR AND BICEP TENDON REPAIR Right 07/22/2020   Procedure: SHOULDER ARTHROSCOPY WITH SUBACROMIAL DECOMPRESSION AND DISTAL CLAVICLE EXCISION, ROTATOR CUFF REPAIR AND BICEPS TENODESIS;  Surgeon: Ophelia Charter  T, MD;  Location: [facility];  Service: Orthopedics;  Laterality: Right;   TRIGGER FINGER RELEASE Left 12/18/2019   Procedure: RELEASE TRIGGER FINGER/A-1 PULLEY;  Surgeon: [name], MD;  Location: [facility];  Service:  Orthopedics;  Laterality: Left;   TUBAL LIGATION       OB History     Gravida  5   Para  3   Term  3   Preterm  0   AB  2   Living         SAB  1   IAB  1   Ectopic  0   Multiple      Live Births              Family History  Problem Relation Age of Onset   Thyroid disease Mother    Diabetes Maternal Grandmother    Heart disease Maternal Grandfather     Social History   Tobacco Use   Smoking status: Never   Smokeless tobacco: Never  Vaping Use   Vaping Use: Never used  Substance Use Topics   Alcohol use: Not Currently    Comment: 03/08/2017 "might have a drink on a holiday"   Drug use: No    Home Medications Prior to Admission medications   Medication Sig Start Date End Date Taking? Authorizing Provider  Accu-Chek FastClix Lancets MISC USE TO CHECK GLUCOSE TWICE A DAY ,FASTING AND 2 HOURS AFTER DINNER Patient taking differently: 1 each by Other route See admin instructions. USE TO CHECK GLUCOSE TWICE A DAY ,FASTING AND 2 HOURS AFTER DINNER 08/30/20   Shamleffer, Melanie Crazier, MD  ACCU-CHEK GUIDE test strip USE TO CHECK GLUCOSE TWICE DAILY,FASTING AND 2 HOURS AFTER DINNER Patient taking differently: 1 each by Other route See admin instructions. USE TO CHECK GLUCOSE TWICE DAILY,FASTING AND 2 HOURS AFTER DINNER 05/09/20   Jacelyn Pi, Lilia Argue, MD  Accu-Chek Softclix Lancets lancets Use as instructed to check blood sugar 2 times a day Patient taking differently: 1 each by Other route See admin instructions. Use as instructed to check blood sugar 2 times a day 08/27/20   Shamleffer, Melanie Crazier, MD  albuterol (PROVENTIL) (2.5 MG/3ML) 0.083% nebulizer solution Take 3 mLs (2.5 mg total) by nebulization every 6 (six) hours as needed for wheezing or shortness of breath. 11/13/19   Wendie Agreste, MD  azelastine (OPTIVAR) 0.05 % ophthalmic solution Place 1 drop into both eyes daily.    [provider]  BIOTIN PO Take 1 tablet by mouth daily.     [provider]  blood glucose meter kit and supplies Per insurance preference. Use to check glucose twice a day (fasting and 2 hours after dinner)  Dx E11.65 Patient taking differently: 1 each by Other route See admin instructions. Per insurance preference. Use to check glucose twice a day (fasting and 2 hours after dinner)  Dx E11.65 10/30/18   Jacelyn Pi, Lilia Argue, MD  clobetasol ointment (TEMOVATE) 0.05 % APPLY TOPICALLY TWICE A DAY AS NEEDED FOR ITCHY RASH AS NEEDED Patient not taking: No sig reported 06/28/20   Wendie Agreste, MD  cyclobenzaprine (FLEXERIL) 10 MG tablet Take 1 tablet (10 mg total) by mouth at bedtime. Patient not taking: No sig reported 05/23/21   Hans Eden, NP  Galcanezumab-gnlm (EMGALITY) 120 MG/ML SOAJ Inject 120 mg into the skin every 30 (thirty) days. 01/18/21   Suzzanne Cloud, NP  insulin glargine-yfgn (SEMGLEE, YFGN,) 100 UNIT/ML Pen  INJECT 40 UNITS INTO THE SKIN DAILY AT 2 PM. 06/14/21   Shamleffer, Melanie Crazier, MD  Insulin Pen Needle (PEN NEEDLES) 32G X 6 MM MISC Use to inject insulin daily Patient taking differently: 1 each by Other route See admin instructions. Use to inject insulin daily 11/22/20   Shamleffer, Melanie Crazier, MD  ketoconazole (NIZORAL) 2 % cream Apply 1 application topically daily. To seborrhea/face lesion as needed. 06/14/20   Wendie Agreste, MD  ketoconazole (NIZORAL) 2 % shampoo Apply 1 application topically 2 (two) times a week. Apply to affected area lather, leave in place for 5 mins and then rinse off with water 01/15/20   Jacelyn Pi, Lilia Argue, MD  levocetirizine (XYZAL) 5 MG tablet Take 5 mg by mouth daily. 11/05/18   [provider]  medroxyPROGESTERone (DEPO-PROVERA) 150 MG/ML injection Inject 150 mg into the muscle every 3 (three) months. 08/15/18   [provider]  meloxicam (MOBIC) 15 MG tablet Take 1 tablet (15 mg total) by mouth daily. Patient not taking: No sig reported 05/23/21   Hans Eden, NP  olopatadine (PATANOL) 0.1 % ophthalmic solution Place 1 drop into both eyes 2 (two) times daily. Patient not taking: No sig reported 12/25/18   Maryruth Hancock, MD  ondansetron (ZOFRAN ODT) 4 MG disintegrating tablet Take 1 tablet (4 mg total) by mouth every 8 (eight) hours as needed for nausea or vomiting. 06/11/21   Sponseller, Gypsy Balsam, PA-C  oxyCODONE-acetaminophen (PERCOCET/ROXICET) 5-325 MG tablet Take 1 tablet by mouth every 6 (six) hours as needed for severe pain. 06/11/21   Sponseller, Eugene Garnet R, PA-C  potassium chloride (KLOR-CON) 10 MEQ tablet TAKE 1 TABLET BY MOUTH EVERY DAY Patient not taking: No sig reported 09/04/20   Wendie Agreste, MD  rizatriptan (MAXALT) 10 MG tablet Take 1 tablet (10 mg total) by mouth 3 (three) times daily as needed for migraine. 01/18/21   Suzzanne Cloud, NP  SEMGLEE, YFGN, 100 UNIT/ML Pen Inject 24 Units into the skin daily. 05/17/21   [provider]  sitaGLIPtin (JANUVIA) 100 MG tablet Take 1 tablet (100 mg total) by mouth daily. 04/21/20   Shamleffer, Melanie Crazier, MD  SYMBICORT 160-4.5 MCG/ACT inhaler INHALE 2 PUFFS INTO THE LUNGS DAILY. Patient taking differently: Inhale 2 puffs into the lungs daily. 05/09/18   Valentina Shaggy, MD  tamsulosin (FLOMAX) 0.4 MG CAPS capsule Take 0.4 mg by mouth daily. 06/09/21   [provider]  triamcinolone (KENALOG) 0.1 % Apply 1 application topically 2 (two) times daily. To affected areas on face, scalp as needed. Patient not taking: No sig reported 06/14/20   Wendie Agreste, MD    Allergies    Ginger, Passion fruit flavor [flavoring agent], Shellfish allergy, Kiwi extract, Other, and Tamiflu [oseltamivir phosphate]  Review of Systems   Review of Systems  All other systems reviewed and are negative.  Physical Exam Updated Vital Signs BP 126/85   Pulse 91   Temp 98.2 F (36.8 C) (Oral)   Resp 18   Ht 5' 4"  (1.626 m)   Wt 68.9 kg   SpO2 99%   BMI 26.09 kg/m    Physical Exam Vitals and nursing note reviewed.  Constitutional:      General: She is not in acute distress.    Appearance: Normal appearance.  HENT:     Head: Normocephalic and atraumatic.  Eyes:     General:        Right eye: No discharge.  Left eye: No discharge.     Pupils: Pupils are equal, round, and reactive to light.  Cardiovascular:     Comments: Regular rate and rhythm.  S1/S2 are distinct without any evidence of murmur, rubs, or gallops.  Radial pulses are 2+ bilaterally.  Dorsalis pedis pulses are 2+ bilaterally.  No evidence of pedal edema. Pulmonary:     Comments: Clear to auscultation bilaterally.  Normal effort.  No respiratory distress.  No evidence of wheezes, rales, or rhonchi heard throughout. Abdominal:     General: Abdomen is flat. Bowel sounds are normal. There is no distension.     Tenderness: There is no abdominal tenderness. There is no guarding or rebound.  Musculoskeletal:        General: Normal range of motion.     Cervical back: Neck supple.  Skin:    General: Skin is warm and dry.     Findings: No rash.  Neurological:     General: No focal deficit present.     Mental Status: She is alert.     Comments: Cranial nerves II through XII are intact.  5/5 strength in the upper and lower extremities.  Normal sensation in the upper and lower extremities.  Extraocular movements are intact without any visible nystagmus.  Psychiatric:        Mood and Affect: Mood normal.        Behavior: Behavior normal.    ED Results / Procedures / Treatments   Labs (all labs ordered are listed, but only abnormal results are displayed) Labs Reviewed  COMPREHENSIVE METABOLIC PANEL - Abnormal; Notable for the following components:      Result Value   Glucose, Bld 159 (*)    BUN <5 (*)    Total Protein 6.4 (*)    Albumin 3.4 (*)    All other components within normal limits  URINALYSIS, ROUTINE W REFLEX MICROSCOPIC - Abnormal; Notable for the following  components:   Color, Urine STRAW (*)    Bacteria, UA RARE (*)    All other components within normal limits  ETHANOL  CBC WITH DIFFERENTIAL/PLATELET  I-STAT BETA HCG BLOOD, ED ([facility], [facility], [facility] ONLY)    EKG None  Radiology No results found.  Procedures Procedures   Medications Ordered in ED Medications  prochlorperazine (COMPAZINE) injection 10 mg (10 mg Intravenous Given 06/16/21 0727)  diphenhydrAMINE (BENADRYL) injection 25 mg (25 mg Intravenous Given 06/16/21 0726)    ED Course  I have reviewed the triage vital signs and the nursing notes.  Pertinent labs & imaging results that were available during my care of the patient were reviewed by me and considered in my medical decision making (see chart for details).    MDM Rules/Calculators/A&P                          Sandra Moody is a 50 y.o. female with history of hypertension who presents to the emergency department for further evaluation of elevated blood pressures and headache.  Initial work-up was started in triage.  Patient has not had any cardiothoracic symptoms have a low suspicion for ACS at this time.  Initial EKG did not show any signs of active ischemia.  CBC was without any abnormalities.  CMP was without any significant abnormalities.  UA was without any signs of overt infection or blood.  Ethanol is negative pregnancy was negative.  Patient is still complaining of headache.  I will treat with Compazine and IV  Benadryl for now.  On reevaluation patient states her headache is completely resolved.  Her blood pressure is within normal limits in the department today and has been on my reevaluation.  She is safe for discharge.  She is currently not having any clinical signs of ACS or stroke at this time. Strict return precautions given. I will have her follow up with her PCP.   Final Clinical Impression(s) / ED Diagnoses Final diagnoses:  Nonintractable headache, unspecified chronicity pattern, unspecified headache type   Elevated blood pressure reading    Rx / DC Orders ED Discharge Orders     None        Hendricks Limes, PA-C 06/16/21 1000    Jeanell Sparrow, DO 06/16/21 1540

## 2021-06-16 NOTE — ED Notes (Signed)
Pt called for X3 for vitals recheck. Pt could not be found.  

## 2021-06-16 NOTE — ED Notes (Signed)
Pt waiting outside

## 2021-07-28 ENCOUNTER — Telehealth: Payer: Self-pay | Admitting: *Deleted

## 2021-07-28 NOTE — Telephone Encounter (Signed)
PA for [facility] started on covermymeds (key: [id number]). Pt has coverage through [employer] [phone]). Decision pending.

## 2021-07-28 NOTE — Telephone Encounter (Signed)
Checked covermymeds. PA was approved through 08/08/2021.

## 2021-07-28 NOTE — Telephone Encounter (Signed)
PA approved through 07/27/2022.  °

## 2021-08-10 ENCOUNTER — Encounter (HOSPITAL_BASED_OUTPATIENT_CLINIC_OR_DEPARTMENT_OTHER): Payer: Self-pay | Admitting: Emergency Medicine

## 2021-08-10 ENCOUNTER — Other Ambulatory Visit (HOSPITAL_BASED_OUTPATIENT_CLINIC_OR_DEPARTMENT_OTHER): Payer: Self-pay | Admitting: Orthopaedic Surgery

## 2021-08-10 ENCOUNTER — Ambulatory Visit (HOSPITAL_BASED_OUTPATIENT_CLINIC_OR_DEPARTMENT_OTHER)
Admission: RE | Admit: 2021-08-10 | Discharge: 2021-08-10 | Disposition: A | Discharge status: Home / Self Care | Payer: 59 | Source: Ambulatory Visit | Attending: Orthopaedic Surgery | Admitting: Orthopaedic Surgery

## 2021-08-10 ENCOUNTER — Other Ambulatory Visit: Payer: Self-pay

## 2021-08-10 ENCOUNTER — Emergency Department (HOSPITAL_BASED_OUTPATIENT_CLINIC_OR_DEPARTMENT_OTHER)
Admission: EM | Admit: 2021-08-10 | Discharge: 2021-08-10 | Disposition: A | Discharge status: Home / Self Care | Payer: 59 | Attending: Emergency Medicine | Admitting: Emergency Medicine

## 2021-08-10 ENCOUNTER — Other Ambulatory Visit (HOSPITAL_BASED_OUTPATIENT_CLINIC_OR_DEPARTMENT_OTHER): Payer: Self-pay

## 2021-08-10 ENCOUNTER — Ambulatory Visit (INDEPENDENT_AMBULATORY_CARE_PROVIDER_SITE_OTHER): Payer: 59 | Admitting: Orthopaedic Surgery

## 2021-08-10 ENCOUNTER — Emergency Department (HOSPITAL_BASED_OUTPATIENT_CLINIC_OR_DEPARTMENT_OTHER): Payer: 59

## 2021-08-10 DIAGNOSIS — M549 Dorsalgia, unspecified: Secondary | ICD-10-CM | POA: Insufficient documentation

## 2021-08-10 DIAGNOSIS — M25559 Pain in unspecified hip: Secondary | ICD-10-CM | POA: Diagnosis not present

## 2021-08-10 DIAGNOSIS — M5459 Other low back pain: Secondary | ICD-10-CM

## 2021-08-10 DIAGNOSIS — R102 Pelvic and perineal pain: Secondary | ICD-10-CM | POA: Diagnosis not present

## 2021-08-10 DIAGNOSIS — R109 Unspecified abdominal pain: Secondary | ICD-10-CM

## 2021-08-10 DIAGNOSIS — M25551 Pain in right hip: Secondary | ICD-10-CM | POA: Diagnosis not present

## 2021-08-10 DIAGNOSIS — R1030 Lower abdominal pain, unspecified: Secondary | ICD-10-CM | POA: Diagnosis present

## 2021-08-10 LAB — CBC WITH DIFFERENTIAL/PLATELET
Abs Immature Granulocytes: 0.02 10*3/uL (ref 0.00–0.07)
Basophils Absolute: 0 10*3/uL (ref 0.0–0.1)
Basophils Relative: 0 %
Eosinophils Absolute: 0.1 10*3/uL (ref 0.0–0.5)
Eosinophils Relative: 1 %
HCT: 39.8 % (ref 36.0–46.0)
Hemoglobin: 13.1 g/dL (ref 12.0–15.0)
Immature Granulocytes: 0 %
Lymphocytes Relative: 32 %
Lymphs Abs: 2.9 10*3/uL (ref 0.7–4.0)
MCH: 30.7 pg (ref 26.0–34.0)
MCHC: 32.9 g/dL (ref 30.0–36.0)
MCV: 93.2 fL (ref 80.0–100.0)
Monocytes Absolute: 0.9 10*3/uL (ref 0.1–1.0)
Monocytes Relative: 10 %
Neutro Abs: 5.1 10*3/uL (ref 1.7–7.7)
Neutrophils Relative %: 57 %
Platelets: 341 10*3/uL (ref 150–400)
RBC: 4.27 MIL/uL (ref 3.87–5.11)
RDW: 13.1 % (ref 11.5–15.5)
WBC: 9.1 10*3/uL (ref 4.0–10.5)
nRBC: 0 % (ref 0.0–0.2)

## 2021-08-10 LAB — URINALYSIS, ROUTINE W REFLEX MICROSCOPIC
Bilirubin Urine: NEGATIVE
Glucose, UA: NEGATIVE mg/dL
Hgb urine dipstick: NEGATIVE
Ketones, ur: NEGATIVE mg/dL
Nitrite: NEGATIVE
Protein, ur: NEGATIVE mg/dL
Specific Gravity, Urine: 1.014 (ref 1.005–1.030)
pH: 6.5 (ref 5.0–8.0)

## 2021-08-10 LAB — BASIC METABOLIC PANEL
Anion gap: 9 (ref 5–15)
BUN: 8 mg/dL (ref 6–20)
CO2: 26 mmol/L (ref 22–32)
Calcium: 9.3 mg/dL (ref 8.9–10.3)
Chloride: 104 mmol/L (ref 98–111)
Creatinine, Ser: 0.98 mg/dL (ref 0.44–1.00)
GFR, Estimated: >60 mL/min (ref >60)
Glucose, Bld: 101 mg/dL — ABNORMAL HIGH (ref 70–99)
Potassium: 3.5 mmol/L (ref 3.5–5.1)
Sodium: 139 mmol/L (ref 135–145)

## 2021-08-10 LAB — PREGNANCY, URINE: Preg Test, Ur: NEGATIVE

## 2021-08-10 MED ORDER — LIDOCAINE 5 % EX PTCH
1.0000 | MEDICATED_PATCH | CUTANEOUS | 0 refills | Status: DC
  Filled 2021-08-10 – 2021-08-26 (×2): qty 30, 30d supply, fill #0

## 2021-08-10 MED ORDER — MELOXICAM 15 MG PO TABS
15.0000 mg | ORAL_TABLET | Freq: Every day | ORAL | 0 refills | Status: DC
  Filled 2021-08-10: qty 30, 30d supply, fill #0

## 2021-08-10 MED ORDER — KETOROLAC TROMETHAMINE 30 MG/ML IJ SOLN
30.0000 mg | Freq: Once | INTRAMUSCULAR | Status: AC
  Administered 2021-08-10: 30 mg via INTRAVENOUS
  Filled 2021-08-10: qty 1

## 2021-08-10 NOTE — Progress Notes (Signed)
Chief Complaint: Right lower back and hip pain     History of Present Illness:    Sandra Moody is a 51 y.o. female presents with right lower back and hip pain since August 2022.  This is been significantly limiting her mobility and activity.  Her pain got so back where she ultimately saw a emergency doctor the day prior.  She has not had any injections or physical therapy.  She is taking Tylenol as well as Robaxin.  She has been taking lidocaine patch hips which do help significantly.  She says that the pain awakens her at night.  She is unable to lay on the side.  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    Surgical History:   None  PMH/PSH/Family History/Social History/Meds/Allergies:    Past Medical History:  Diagnosis Date   Acute renal disease    Asthma    Bilateral carpal tunnel syndrome 03/11/2019   Common migraine with intractable migraine 08/29/2018   Daily headache    (03/08/2017)   Essential hypertension 02/20/2019   GERD (gastroesophageal reflux disease)    H pylori ulcer ~ 08/2016   History of hiatal hernia    Leaky heart valve ~ 09/2016   Migraine    "maybe weekly" (03/08/2017)   PONV (postoperative nausea and vomiting)    Type 2 diabetes mellitus with hyperglycemia, without long-term current use of insulin ([place]) 02/20/2019   Past Surgical History:  Procedure Laterality Date   CARPAL TUNNEL RELEASE Left 12/18/2019   Procedure: CARPAL TUNNEL RELEASE;  Surgeon: [name], MD;  Location: [facility];  Service: Orthopedics;  Laterality: Left;   CARPAL TUNNEL RELEASE Right 07/22/2020   Procedure: CARPAL TUNNEL RELEASE RIGHT;  Surgeon: [name], MD;  Location: [facility];  Service: Orthopedics;  Laterality: Right;   CHOLECYSTECTOMY N/A 03/08/2017   Procedure: LAPAROSCOPIC CHOLECYSTECTOMY;  Surgeon:  Coralie Keens, MD;  Location: [facility];  Service: General;  Laterality: N/A;   DILATION AND CURETTAGE OF UTERUS     LAPAROSCOPIC CHOLECYSTECTOMY  03/08/2017   SHOULDER ARTHROSCOPY WITH SUBACROMIAL DECOMPRESSION, ROTATOR CUFF REPAIR AND BICEP TENDON REPAIR Right 07/22/2020   Procedure: SHOULDER ARTHROSCOPY WITH SUBACROMIAL DECOMPRESSION AND DISTAL CLAVICLE EXCISION, ROTATOR CUFF REPAIR AND BICEPS TENODESIS;  Surgeon: Hiram Gash, MD;  Location: [facility];  Service: Orthopedics;  Laterality: Right;   TRIGGER FINGER RELEASE Left 12/18/2019   Procedure: RELEASE TRIGGER FINGER/A-1 PULLEY;  Surgeon: [name], MD;  Location: [facility];  Service: Orthopedics;  Laterality: Left;   TUBAL LIGATION     Social History   Socioeconomic History   Marital status: Significant Other    Spouse name: Not on file   Number of children: 3   Years of education: Not on file   Highest education level: 12th grade  Occupational History    Comment: [employer]   Tobacco Use   Smoking status: Never   Smokeless tobacco: Never  Vaping Use   Vaping Use: Never used  Substance and Sexual Activity   Alcohol use: Not Currently    Comment: 03/08/2017 "might have a drink on a holiday"   Drug use: No   Sexual activity: Yes  Partners: Male    Birth control/protection: Injection  Other Topics Concern   Not on file  Social History Narrative   ** Merged History Encounter **   Right handed    Lives at home with significant other 2 cups daily of caffeine    Social Determinants of Health   Financial Resource Strain: Not on file  Food Insecurity: Not on file  Transportation Needs: Not on file  Physical Activity: Not on file  Stress: Not on file  Social Connections: Not on file   Family History  Problem Relation Age of Onset   Thyroid disease Mother    Diabetes Maternal Grandmother    Heart disease Maternal Grandfather    Allergies  Allergen Reactions   Ginger Itching  and Swelling   Passion Fruit Flavor [Flavoring Agent] Itching and Swelling   Shellfish Allergy Anaphylaxis   Kiwi Extract Other (See Comments)   Other Other (See Comments)    ALLERGIC TO ALL TREE NUTS ALLERGIC TO ALL TREE NUTS   Tamiflu [Oseltamivir Phosphate]    Current Outpatient Medications  Medication Sig Dispense Refill   meloxicam (MOBIC) 15 MG tablet Take 1 tablet (15 mg total) by mouth daily. 30 tablet 0   Accu-Chek FastClix Lancets MISC USE TO CHECK GLUCOSE TWICE A DAY ,FASTING AND 2 HOURS AFTER DINNER (Patient taking differently: 1 each by Other route See admin instructions. USE TO CHECK GLUCOSE TWICE A DAY ,FASTING AND 2 HOURS AFTER DINNER) 102 each 5   ACCU-CHEK GUIDE test strip USE TO CHECK GLUCOSE TWICE DAILY,FASTING AND 2 HOURS AFTER DINNER (Patient taking differently: 1 each by Other route See admin instructions. USE TO CHECK GLUCOSE TWICE DAILY,FASTING AND 2 HOURS AFTER DINNER) 100 strip 5   Accu-Chek Softclix Lancets lancets Use as instructed to check blood sugar 2 times a day (Patient taking differently: 1 each by Other route See admin instructions. Use as instructed to check blood sugar 2 times a day) 100 each 5   albuterol (PROVENTIL) (2.5 MG/3ML) 0.083% nebulizer solution Take 3 mLs (2.5 mg total) by nebulization every 6 (six) hours as needed for wheezing or shortness of breath. 75 mL 1   azelastine (OPTIVAR) 0.05 % ophthalmic solution Place 1 drop into both eyes daily.     BIOTIN PO Take 1 tablet by mouth daily.     blood glucose meter kit and supplies Per insurance preference. Use to check glucose twice a day (fasting and 2 hours after dinner)  Dx E11.65 (Patient taking differently: 1 each by Other route See admin instructions. Per insurance preference. Use to check glucose twice a day (fasting and 2 hours after dinner)  Dx E11.65) 1 each 5   clobetasol ointment (TEMOVATE) 0.05 % APPLY TOPICALLY TWICE A DAY AS NEEDED FOR ITCHY RASH AS NEEDED (Patient not taking: No sig  reported) 30 g 0   Galcanezumab-gnlm (EMGALITY) 120 MG/ML SOAJ Inject 120 mg into the skin every 30 (thirty) days. 1 mL 11   insulin glargine-yfgn (SEMGLEE, YFGN,) 100 UNIT/ML Pen INJECT 40 UNITS INTO THE SKIN DAILY AT 2 PM. 15 mL 0   Insulin Pen Needle (PEN NEEDLES) 32G X 6 MM MISC Use to inject insulin daily (Patient taking differently: 1 each by Other route See admin instructions. Use to inject insulin daily) 100 each 2   ketoconazole (NIZORAL) 2 % cream Apply 1 application topically daily. To seborrhea/face lesion as needed. 15 g 0   ketoconazole (NIZORAL) 2 % shampoo Apply 1 application topically 2 (two) times  a week. Apply to affected area lather, leave in place for 5 mins and then rinse off with water 120 mL 5   levocetirizine (XYZAL) 5 MG tablet Take 5 mg by mouth daily.     medroxyPROGESTERone (DEPO-PROVERA) 150 MG/ML injection Inject 150 mg into the muscle every 3 (three) months.     olopatadine (PATANOL) 0.1 % ophthalmic solution Place 1 drop into both eyes 2 (two) times daily. (Patient not taking: No sig reported) 5 mL 12   ondansetron (ZOFRAN ODT) 4 MG disintegrating tablet Take 1 tablet (4 mg total) by mouth every 8 (eight) hours as needed for nausea or vomiting. 20 tablet 0   potassium chloride (KLOR-CON) 10 MEQ tablet TAKE 1 TABLET BY MOUTH EVERY DAY (Patient not taking: No sig reported) 90 tablet 1   rizatriptan (MAXALT) 10 MG tablet Take 1 tablet (10 mg total) by mouth 3 (three) times daily as needed for migraine. 6 tablet 11   SEMGLEE, YFGN, 100 UNIT/ML Pen Inject 24 Units into the skin daily.     sitaGLIPtin (JANUVIA) 100 MG tablet Take 1 tablet (100 mg total) by mouth daily. 90 tablet 3   SYMBICORT 160-4.5 MCG/ACT inhaler INHALE 2 PUFFS INTO THE LUNGS DAILY. (Patient taking differently: Inhale 2 puffs into the lungs daily.) 10.2 Inhaler 0   tamsulosin (FLOMAX) 0.4 MG CAPS capsule Take 0.4 mg by mouth daily.     triamcinolone (KENALOG) 0.1 % Apply 1 application topically 2 (two)  times daily. To affected areas on face, scalp as needed. (Patient not taking: No sig reported) 30 g 0   No current facility-administered medications for this visit.   US PELVIC COMPLETE W TRANSVAGINAL AND TORSION R/O  Result Date: 08/10/2021 CLINICAL DATA:  Pelvic pain EXAM: TRANSABDOMINAL AND TRANSVAGINAL ULTRASOUND OF PELVIS DOPPLER ULTRASOUND OF OVARIES TECHNIQUE: Both transabdominal and transvaginal ultrasound examinations of the pelvis were performed. Transabdominal technique was performed for global imaging of the pelvis including uterus, ovaries, adnexal regions, and pelvic cul-de-sac. It was necessary to proceed with endovaginal exam following the transabdominal exam to visualize the 06/12/2021. Color and duplex Doppler ultrasound was utilized to evaluate blood flow to the ovaries. COMPARISON:  None. FINDINGS: Uterus Measurements: 7.5 x 4 x 5 cm = volume: 75 mL. No fibroids or other mass visualized. Endometrium Thickness: 9 mm.  No focal abnormality visualized. Right ovary Measurements: 26 x 16 x 22 mm = volume: 5 mL. Normal appearance/no adnexal mass. Left ovary Measurements: 22 x 12 x 24 mm = volume: 3 mL. Normal appearance/no adnexal mass. Pulsed Doppler evaluation of both ovaries demonstrates normal low-resistance arterial and venous waveforms. Other findings No abnormal free fluid. IMPRESSION: Negative pelvic ultrasound.  No explanation for pain. Electronically Signed   By: Jorje Guild M.D.   On: 08/10/2021 05:59    Review of Systems:   A ROS was performed including pertinent positives and negatives as documented in the HPI.  Physical Exam :   Constitutional: NAD and appears stated age Neurological: Alert and oriented Psych: Appropriate affect and cooperative There were no vitals taken for this visit.   Comprehensive Musculoskeletal Exam:    Tenderness to palpation over the right lower back area.  No midline tenderness.  There is patient with flexion internal rotation of the right  hip.  There is also pain with external rotation of the right hip.  The pain is somewhat located in a C shape distribution about the groin although there is more so pain in the right lower back  area.  Imaging:   Xray (3 views right hip): Normal   I personally reviewed and interpreted the radiographs.   Assessment:   51 year old female with right lower back pain as well as hip pain.  At this time she is quite limited in her activity.  I do believe that she would benefit for physical therapy for range of motion and and work on her core in order to hopefully improve her back muscles.  I would plan to see her back in 3 weeks at that time for further assessment of the right hip  Plan :    -Return to clinic in 3 weeks     I personally saw and evaluated the patient, and participated in the management and treatment plan.  Vanetta Mulders, MD Attending Physician, Orthopedic Surgery  This document was dictated using Dragon voice recognition software. A reasonable attempt at proof reading has been made to minimize errors.

## 2021-08-10 NOTE — Discharge Instructions (Signed)

## 2021-08-10 NOTE — ED Triage Notes (Signed)
Pt presents for back pain since November, was diagnosed with kidney stones but had been told all stones had passed based on imaging 09/19/22. Back pain has continued and worsened, now RLQ abd pain and nausea since yesterday. Some relief with heat, no relief with muscle relaxers. Denies hematuria, dysuria, fever, chills, V/D. H/o tubal ligation, cholecystectomy in 2018. H/o htn, DMII, asthma

## 2021-08-10 NOTE — ED Provider Notes (Signed)
[facility] EMERGENCY DEPT Provider Note   CSN: [id number] Arrival date & time: 08/10/21  0343     History  Chief Complaint  Patient presents with   Back Pain    Sandra Moody is a 51 y.o. female.  The history is provided by the patient.  Flank Pain This is a new problem. The current episode started more than 1 week ago. The problem occurs daily. The problem has been gradually worsening. Associated symptoms include abdominal pain. Pertinent negatives include no chest pain and no shortness of breath. Exacerbated by: movement and palpation. The symptoms are relieved by rest.  Patient presents with back/flank and abdominal pain.  She reports that the flank pain started over  2 months ago.  She has had multiple CT scans and follow-up with urology, but the pain continues.  She reports over the past 24 hours she has been having increasing lower abdominal pain.  She reports she has had this previously as well, but has now returned.  No fevers or vomiting.  Previous surgical history includes tubal ligation & cholecystectomy    Home Medications Prior to Admission medications   Medication Sig Start Date End Date Taking? Authorizing Provider  Accu-Chek FastClix Lancets MISC USE TO CHECK GLUCOSE TWICE A DAY ,FASTING AND 2 HOURS AFTER DINNER Patient taking differently: 1 each by Other route See admin instructions. USE TO CHECK GLUCOSE TWICE A DAY ,FASTING AND 2 HOURS AFTER DINNER 08/30/20   Shamleffer, Melanie Crazier, MD  ACCU-CHEK GUIDE test strip USE TO CHECK GLUCOSE TWICE DAILY,FASTING AND 2 HOURS AFTER DINNER Patient taking differently: 1 each by Other route See admin instructions. USE TO CHECK GLUCOSE TWICE DAILY,FASTING AND 2 HOURS AFTER DINNER 05/09/20   Jacelyn Pi, Lilia Argue, MD  Accu-Chek Softclix Lancets lancets Use as instructed to check blood sugar 2 times a day Patient taking differently: 1 each by Other route See admin instructions. Use as instructed to check blood  sugar 2 times a day 08/27/20   Shamleffer, Melanie Crazier, MD  albuterol (PROVENTIL) (2.5 MG/3ML) 0.083% nebulizer solution Take 3 mLs (2.5 mg total) by nebulization every 6 (six) hours as needed for wheezing or shortness of breath. 11/13/19   Wendie Agreste, MD  azelastine (OPTIVAR) 0.05 % ophthalmic solution Place 1 drop into both eyes daily.    [provider]  BIOTIN PO Take 1 tablet by mouth daily.    [provider]  blood glucose meter kit and supplies Per insurance preference. Use to check glucose twice a day (fasting and 2 hours after dinner)  Dx E11.65 Patient taking differently: 1 each by Other route See admin instructions. Per insurance preference. Use to check glucose twice a day (fasting and 2 hours after dinner)  Dx E11.65 10/30/18   Jacelyn Pi, Lilia Argue, MD  clobetasol ointment (TEMOVATE) 0.05 % APPLY TOPICALLY TWICE A DAY AS NEEDED FOR ITCHY RASH AS NEEDED Patient not taking: No sig reported 06/28/20   Wendie Agreste, MD  Galcanezumab-gnlm (EMGALITY) 120 MG/ML SOAJ Inject 120 mg into the skin every 30 (thirty) days. 01/18/21   Suzzanne Cloud, NP  insulin glargine-yfgn (SEMGLEE, YFGN,) 100 UNIT/ML Pen INJECT 40 UNITS INTO THE SKIN DAILY AT 2 PM. 06/14/21   Shamleffer, Melanie Crazier, MD  Insulin Pen Needle (PEN NEEDLES) 32G X 6 MM MISC Use to inject insulin daily Patient taking differently: 1 each by Other route See admin instructions. Use to inject insulin daily 11/22/20   Shamleffer, Melanie Crazier, MD  ketoconazole (NIZORAL) 2 % cream Apply 1 application topically daily. To seborrhea/face lesion as needed. 06/14/20   Wendie Agreste, MD  ketoconazole (NIZORAL) 2 % shampoo Apply 1 application topically 2 (two) times a week. Apply to affected area lather, leave in place for 5 mins and then rinse off with water 01/15/20   Jacelyn Pi, Lilia Argue, MD  levocetirizine (XYZAL) 5 MG tablet Take 5 mg by mouth daily. 11/05/18   [provider]   medroxyPROGESTERone (DEPO-PROVERA) 150 MG/ML injection Inject 150 mg into the muscle every 3 (three) months. 08/15/18   [provider]  olopatadine (PATANOL) 0.1 % ophthalmic solution Place 1 drop into both eyes 2 (two) times daily. Patient not taking: No sig reported 12/25/18   Maryruth Hancock, MD  ondansetron (ZOFRAN ODT) 4 MG disintegrating tablet Take 1 tablet (4 mg total) by mouth every 8 (eight) hours as needed for nausea or vomiting. 06/11/21   Sponseller, Eugene Garnet R, PA-C  potassium chloride (KLOR-CON) 10 MEQ tablet TAKE 1 TABLET BY MOUTH EVERY DAY Patient not taking: No sig reported 09/04/20   Wendie Agreste, MD  rizatriptan (MAXALT) 10 MG tablet Take 1 tablet (10 mg total) by mouth 3 (three) times daily as needed for migraine. 01/18/21   Suzzanne Cloud, NP  SEMGLEE, YFGN, 100 UNIT/ML Pen Inject 24 Units into the skin daily. 05/17/21   [provider]  sitaGLIPtin (JANUVIA) 100 MG tablet Take 1 tablet (100 mg total) by mouth daily. 04/21/20   Shamleffer, Melanie Crazier, MD  SYMBICORT 160-4.5 MCG/ACT inhaler INHALE 2 PUFFS INTO THE LUNGS DAILY. Patient taking differently: Inhale 2 puffs into the lungs daily. 05/09/18   Valentina Shaggy, MD  tamsulosin (FLOMAX) 0.4 MG CAPS capsule Take 0.4 mg by mouth daily. 06/09/21   [provider]  triamcinolone (KENALOG) 0.1 % Apply 1 application topically 2 (two) times daily. To affected areas on face, scalp as needed. Patient not taking: No sig reported 06/14/20   Wendie Agreste, MD      Allergies    Ginger, Passion fruit flavor [flavoring agent], Shellfish allergy, Kiwi extract, Other, and Tamiflu [oseltamivir phosphate]    Review of Systems   Review of Systems  Constitutional:  Negative for fever.  Respiratory:  Negative for shortness of breath.   Cardiovascular:  Negative for chest pain.  Gastrointestinal:  Positive for abdominal pain. Negative for vomiting.  Genitourinary:  Positive for flank pain. Negative  for dysuria, urgency, vaginal bleeding and vaginal discharge.  All other systems reviewed and are negative.  Physical Exam Updated Vital Signs BP (!) 144/88 (BP Location: Right Arm)    Pulse 95    Temp 99.4 F (37.4 C) (Oral)    Resp 18    Ht 1.626 m (5' 4" )    Wt 68.9 kg    SpO2 100%    BMI 26.09 kg/m  Physical Exam CONSTITUTIONAL: Well developed/well nourished, anxious HEAD: Normocephalic/atraumatic EYES: EOMI/PERRL ENMT: Mucous membranes moist NECK: supple no meningeal signs SPINE/BACK:entire spine nontender, no bruising/crepitance/stepoffs noted to spine CV: S1/S2 noted, no murmurs/rubs/gallops noted LUNGS: Lungs are clear to auscultation bilaterally, no apparent distress ABDOMEN: soft, mild RLQ tenderness, no rebound or guarding, bowel sounds noted throughout abdomen GU: Right cva tenderness, heating pad in place on my exam.  No rash or bruising or skin discoloration noted to right flank NEURO: Pt is awake/alert/appropriate, moves all extremitiesx4.  No facial droop.   EXTREMITIES: pulses normal/equal, full ROM SKIN: warm, color normal PSYCH: Anxious  ED Results / Procedures / Treatments   Labs (all labs ordered are listed, but only abnormal results are displayed) Labs Reviewed  URINALYSIS, ROUTINE W REFLEX MICROSCOPIC - Abnormal; Notable for the following components:      Result Value   Leukocytes,Ua TRACE (*)    All other components within normal limits  BASIC METABOLIC PANEL - Abnormal; Notable for the following components:   Glucose, Bld 101 (*)    All other components within normal limits  PREGNANCY, URINE  CBC WITH DIFFERENTIAL/PLATELET  GC/CHLAMYDIA PROBE AMP ([place]) NOT AT [facility]    EKG None  Radiology US PELVIC COMPLETE W TRANSVAGINAL AND TORSION R/O  Result Date: 08/10/2021 CLINICAL DATA:  Pelvic pain EXAM: TRANSABDOMINAL AND TRANSVAGINAL ULTRASOUND OF PELVIS DOPPLER ULTRASOUND OF OVARIES TECHNIQUE: Both transabdominal and transvaginal ultrasound  examinations of the pelvis were performed. Transabdominal technique was performed for global imaging of the pelvis including uterus, ovaries, adnexal regions, and pelvic cul-de-sac. It was necessary to proceed with endovaginal exam following the transabdominal exam to visualize the 06/12/2021. Color and duplex Doppler ultrasound was utilized to evaluate blood flow to the ovaries. COMPARISON:  None. FINDINGS: Uterus Measurements: 7.5 x 4 x 5 cm = volume: 75 mL. No fibroids or other mass visualized. Endometrium Thickness: 9 mm.  No focal abnormality visualized. Right ovary Measurements: 26 x 16 x 22 mm = volume: 5 mL. Normal appearance/no adnexal mass. Left ovary Measurements: 22 x 12 x 24 mm = volume: 3 mL. Normal appearance/no adnexal mass. Pulsed Doppler evaluation of both ovaries demonstrates normal low-resistance arterial and venous waveforms. Other findings No abnormal free fluid. IMPRESSION: Negative pelvic ultrasound.  No explanation for pain. Electronically Signed   By: Jorje Guild M.D.   On: 08/10/2021 05:59    Procedures Procedures    Medications Ordered in ED Medications  ketorolac (TORADOL) 30 MG/ML injection 30 mg (30 mg Intravenous Given 08/10/21 0416)    ED Course/ Medical Decision Making/ A&P                           Medical Decision Making  This patient presents to the ED for concern of flank and abdominal pain, this involves an extensive number of treatment options, and is a complaint that carries with it a high risk of complications and morbidity.  The differential diagnosis includes UTI, pyelonephritis, kidney stone, muscle strain, appendicitis, TOA, torsion, ectopic pregnancy  Social Determinants of Health: Patients change in insurance to increases the complexity of managing their presentation  Additional history obtained: Records reviewed Care Everywhere/External Records Patient has had multiple CT and ultrasound imaging modalities.  Recent records reveal that urology  does not feel this is urologic in nature has been referred back to PCP.  Lab Tests: I Ordered, and personally interpreted labs.  The pertinent results include:  no acute findings  Imaging Studies ordered: I ordered imaging studies including Ultrasound pelvis I independently visualized and interpreted imaging which showed no acute findings I agree with the radiologist interpretation  Medicines ordered and prescription drug management: I ordered medication including IV toradol  for flank pain  Reevaluation of the patient after these medicines showed that the patient    improved  Reevaluation: After the interventions noted above, I reevaluated the patient and found that they have :improved  Complexity of problems addressed: Patients presentation is most consistent with  acute, uncomplicated illness      Disposition: After consideration of the diagnostic results and the  patients response to treatment,  I feel that the patent would benefit from discharge .   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  Patient reports pain is continuing in her right flank.  Denies any falls or trauma.  She denies any midline back pain denies any weakness.  It is an unclear cause of pain, symptoms have been present for 2 months.  Patient reports abdominal pain is improving.  Given that she has had this previously, and elevation of white count, I have low suspicion for acute appendicitis or other abdominal emergency.  Patient has had up to 4 CT abdomen pelvis studies ([place] [facility]) performed since November, and due to radiation exposure and cost we both agree that emergent  CT imaging study not indicated at this time.  Patient be discharged home.  Referrals have been given.         Final Clinical Impression(s) / ED Diagnoses Final diagnoses:  Pelvic pain  Flank pain    Rx / DC Orders ED Discharge Orders     None         Ripley Fraise, MD 08/10/21 [phone]

## 2021-08-10 NOTE — ED Notes (Signed)
Pt verbalizes understanding of discharge instructions. Opportunity for questioning and answers were provided. Pt discharged from ED to home.   ? ?

## 2021-08-11 LAB — GC/CHLAMYDIA PROBE AMP (~~LOC~~) NOT AT ARMC
Chlamydia: NEGATIVE
Comment: NEGATIVE
Comment: NORMAL
Neisseria Gonorrhea: NEGATIVE

## 2021-08-11 IMAGING — DX DG HAND COMPLETE 3+V*L*
3 series · 3 of 3 positions shown · non-contrast
Comparison: None.

CLINICAL DATA: Left thumb infection and pain for 3 days. No
reported injury.

EXAM:
LEFT HAND - COMPLETE 3+ VIEW

[hand pa]
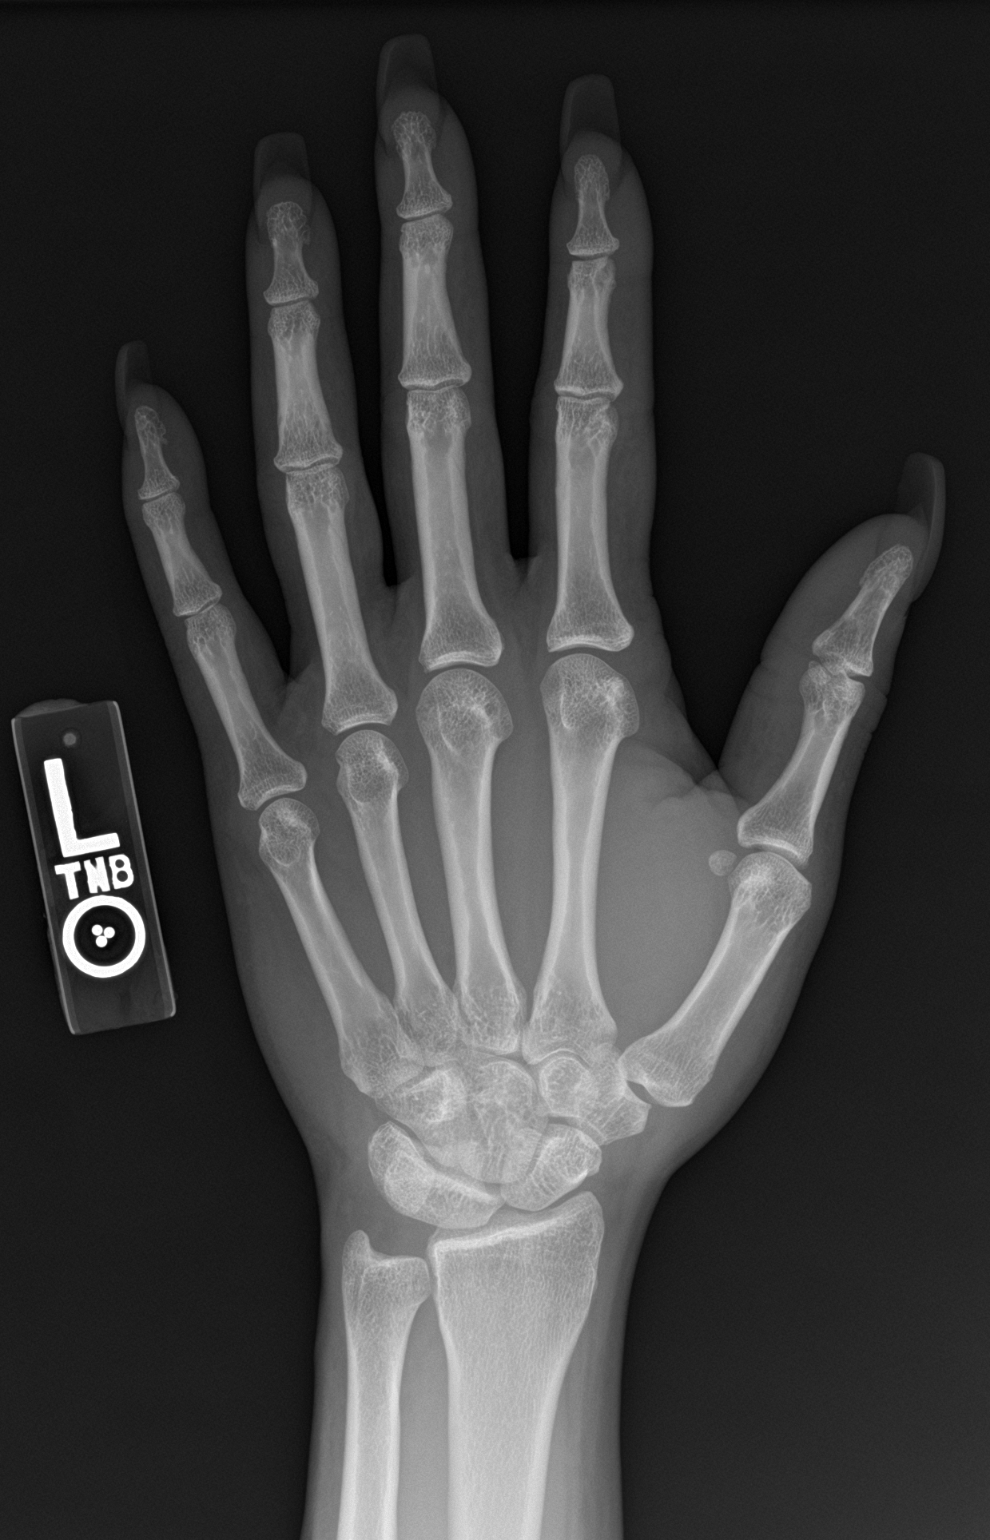

[hand obl]
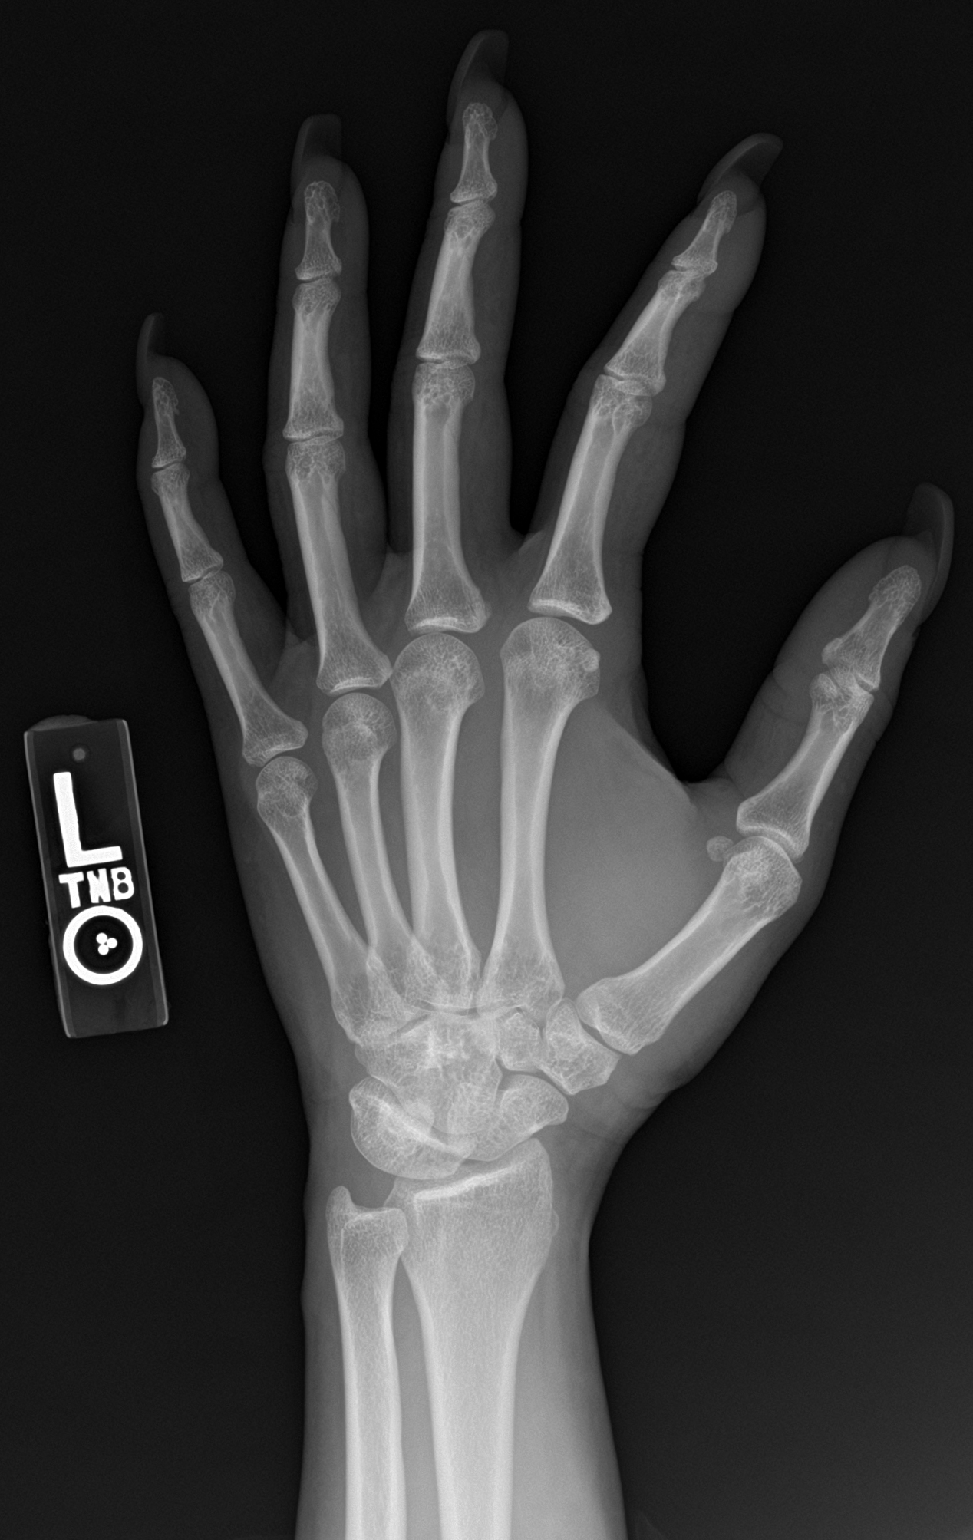

[hand lat]
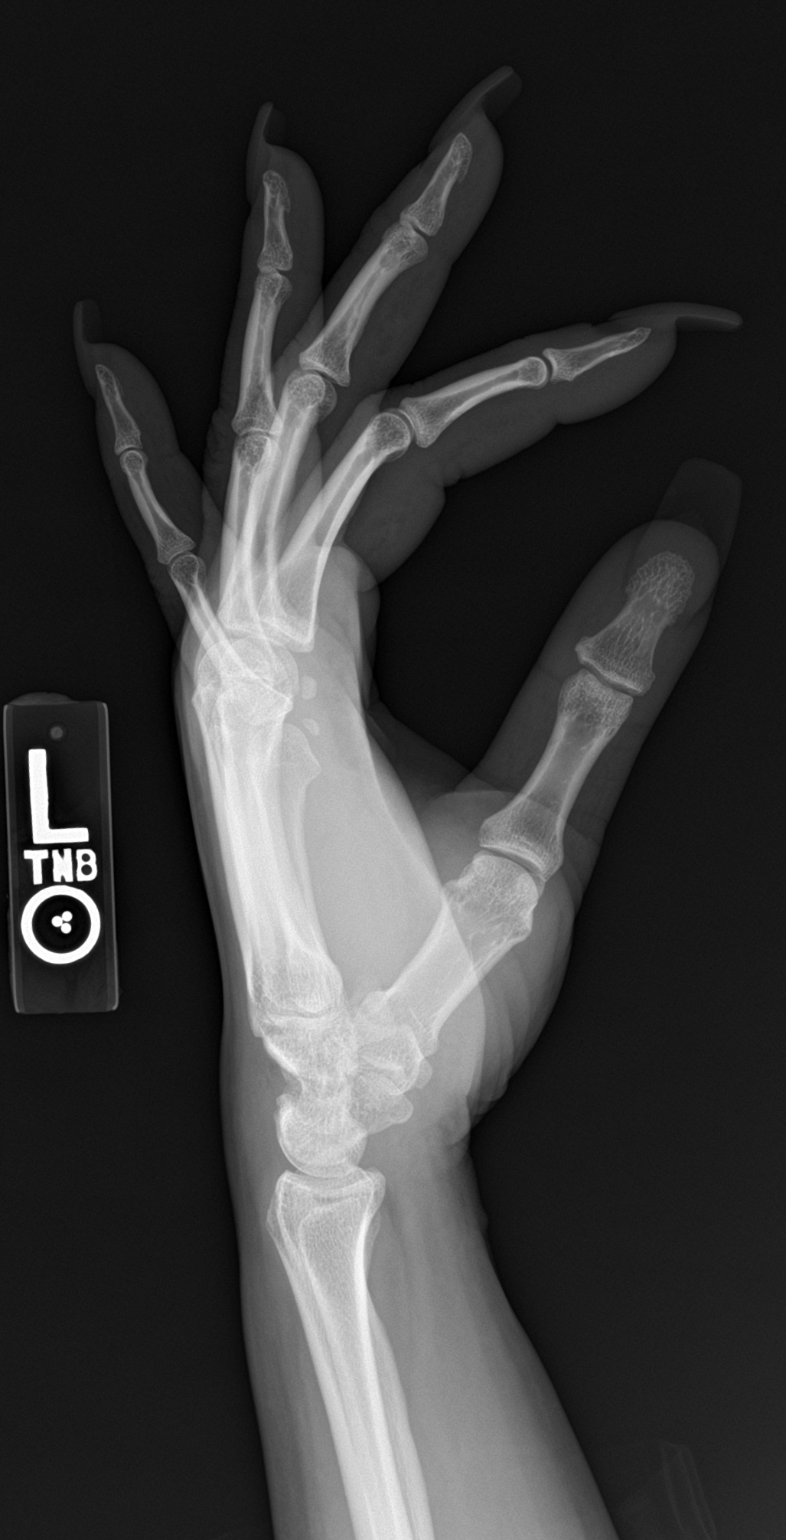

[3 of 3 positions shown; findings below may reference images not displayed]

FINDINGS: No fracture or dislocation. No osseous erosions or periosteal
reaction. No evidence of soft tissue gas. No suspicious focal
osseous lesions. No significant arthropathy. No radiopaque foreign
body.
IMPRESSION: No acute osseous abnormality.

## 2021-08-17 ENCOUNTER — Encounter: Payer: Self-pay | Admitting: Internal Medicine

## 2021-08-17 ENCOUNTER — Other Ambulatory Visit: Payer: Self-pay

## 2021-08-17 ENCOUNTER — Ambulatory Visit (INDEPENDENT_AMBULATORY_CARE_PROVIDER_SITE_OTHER): Payer: 59 | Admitting: Internal Medicine

## 2021-08-17 VITALS — BP 120/80 | HR 84 | Ht 64.0 in | Wt 153.4 lb

## 2021-08-17 DIAGNOSIS — E119 Type 2 diabetes mellitus without complications: Secondary | ICD-10-CM

## 2021-08-17 LAB — POCT GLUCOSE (DEVICE FOR HOME USE): Glucose Fasting, POC: 119 mg/dL — AB (ref 70–99)

## 2021-08-17 MED ORDER — INSULIN PEN NEEDLE 31G X 5 MM MISC: 1.0000 | Freq: Every day | 3 refills | Status: DC

## 2021-08-17 MED ORDER — SITAGLIPTIN PHOSPHATE 100 MG PO TABS
50.0000 mg | ORAL_TABLET | Freq: Every day | ORAL | 3 refills | Status: DC
Start: 2021-08-17 — End: 2022-01-18

## 2021-08-17 MED ORDER — SEMGLEE (YFGN) 100 UNIT/ML ~~LOC~~ SOPN: 24.0000 [IU] | PEN_INJECTOR | Freq: Every day | SUBCUTANEOUS | 3 refills | Status: DC

## 2021-08-17 NOTE — Progress Notes (Signed)
Name: Sandra Moody  Age/ Sex: 51 y.o., female   MRN/ DOB: [id number], Oct 25, 1970     PCP: [facility], [facility]   Reason for Endocrinology Evaluation: Type 2 Diabetes Mellitus  Initial Endocrine Consultative Visit: 09/05/2019    PATIENT IDENTIFIER: Sandra Moody is a 51 y.o. female with a past medical history of asthma. The patient has followed with Endocrinology clinic since 09/05/2019 for consultative assistance with management of her diabetes.  DIABETIC HISTORY:  Sandra Moody was diagnosed with DM in 09/2018.  She has been on multiple oral glycemic agents with reported intolerance.   Glipizide caused stomach issues as well as Metformin.  Actos caused hypoglycemia and was discontinued in 07/2019 , she has not been on insulin before. Her hemoglobin A1c has ranged from 6.1% in 08/2019, peaking at 10.3%  in 2020  On her initial visit to our clinic she was not on any oral glycemic agents, and declined any oral medications at the time.  By 12/2019 she presented to her PCP with hypoglycemia and BG is in the 300s patient was started on Januvia at the time.   Started Jardiance 07/2020 but she does not want it    SUBJECTIVE:   During the last visit (08/23/2020): A1c 8.0 %, We continued Januvia and decreased basal insulin and started Jardiance    Today (08/17/2021): Sandra Moody is here for follow-up on diabetes management. She has NOT been to our clinic in 12 months.  She checks her blood sugars 0 times daily.   Denies nausea, vomiting or diarrhea   HOME DIABETES REGIMEN:  Januvia 100 mg daily  Lantus 24 units daily     METER DOWNLOAD SUMMARY: Does not check       DIABETIC COMPLICATIONS: Microvascular complications:   Denies: neuropathy , retinopathy,  Last eye exam: Completed 09/2019   Macrovascular complications:    Denies: CAD, PVD, CVA  HISTORY:  Past Medical History:  Past Medical History:  Diagnosis Date   Acute renal disease    Asthma    Bilateral carpal  tunnel syndrome 03/11/2019   Common migraine with intractable migraine 08/29/2018   Daily headache    (03/08/2017)   Essential hypertension 02/20/2019   GERD (gastroesophageal reflux disease)    H pylori ulcer ~ 08/2016   History of hiatal hernia    Leaky heart valve ~ 09/2016   Migraine    "maybe weekly" (03/08/2017)   PONV (postoperative nausea and vomiting)    Type 2 diabetes mellitus with hyperglycemia, without long-term current use of insulin ([place]) 02/20/2019   Past Surgical History:  Past Surgical History:  Procedure Laterality Date   CARPAL TUNNEL RELEASE Left 12/18/2019   Procedure: CARPAL TUNNEL RELEASE;  Surgeon: [name], MD;  Location: [place];  Service: Orthopedics;  Laterality: Left;   CARPAL TUNNEL RELEASE Right 07/22/2020   Procedure: CARPAL TUNNEL RELEASE RIGHT;  Surgeon: [name], MD;  Location: [place];  Service: Orthopedics;  Laterality: Right;   CHOLECYSTECTOMY N/A 03/08/2017   Procedure: LAPAROSCOPIC CHOLECYSTECTOMY;  Surgeon: Coralie Keens, MD;  Location: [place];  Service: General;  Laterality: N/A;   DILATION AND CURETTAGE OF UTERUS     LAPAROSCOPIC CHOLECYSTECTOMY  03/08/2017   SHOULDER ARTHROSCOPY WITH SUBACROMIAL DECOMPRESSION, ROTATOR CUFF REPAIR AND BICEP TENDON REPAIR Right 07/22/2020   Procedure: SHOULDER ARTHROSCOPY WITH SUBACROMIAL DECOMPRESSION AND DISTAL CLAVICLE EXCISION, ROTATOR CUFF REPAIR AND BICEPS TENODESIS;  Surgeon: Hiram Gash, MD;  Location: [place];  Service: Orthopedics;  Laterality: Right;   TRIGGER FINGER RELEASE Left 12/18/2019   Procedure: RELEASE TRIGGER FINGER/A-1 PULLEY;  Surgeon: [name], MD;  Location: [place];  Service: Orthopedics;  Laterality: Left;   TUBAL LIGATION     Social History:  reports that she has never smoked. She has never used smokeless tobacco. She reports that she does not currently use alcohol. She reports that she does not use  drugs. Family History:  Family History  Problem Relation Age of Onset   Thyroid disease Mother    Diabetes Maternal Grandmother    Heart disease Maternal Grandfather      HOME MEDICATIONS: Allergies as of 08/17/2021       Reactions   Ginger Itching, Swelling   Passion Fruit Flavor [flavoring Agent] Itching, Swelling   Shellfish Allergy Anaphylaxis   Kiwi Extract Other (See Comments)   Other Other (See Comments)   ALLERGIC TO ALL TREE NUTS ALLERGIC TO ALL TREE NUTS   Tamiflu [oseltamivir Phosphate]         Medication List        Accurate as of August 17, 2021  7:40 AM. If you have any questions, ask your nurse or doctor.          Accu-Chek Softclix Lancets lancets Use as instructed to check blood sugar 2 times a day What changed:  how much to take how to take this when to take this   Accu-Chek FastClix Lancets Misc USE TO CHECK GLUCOSE TWICE A DAY ,FASTING AND 2 HOURS AFTER DINNER What changed: See the new instructions.   Accu-Chek Guide test strip Generic drug: glucose blood USE TO CHECK GLUCOSE TWICE DAILY,FASTING AND 2 HOURS AFTER DINNER What changed: See the new instructions.   albuterol (2.5 MG/3ML) 0.083% nebulizer solution Commonly known as: PROVENTIL Take 3 mLs (2.5 mg total) by nebulization every 6 (six) hours as needed for wheezing or shortness of breath.   azelastine 0.05 % ophthalmic solution Commonly known as: OPTIVAR Place 1 drop into both eyes daily.   BIOTIN PO Take 1 tablet by mouth daily.   blood glucose meter kit and supplies Per insurance preference. Use to check glucose twice a day (fasting and 2 hours after dinner)  Dx E11.65 What changed:  how much to take how to take this when to take this   clobetasol ointment 0.05 % Commonly known as: TEMOVATE APPLY TOPICALLY TWICE A DAY AS NEEDED FOR ITCHY RASH AS NEEDED   Emgality 120 MG/ML Soaj Generic drug: Galcanezumab-gnlm Inject 120 mg into the skin every 30 (thirty) days.    ketoconazole 2 % shampoo Commonly known as: NIZORAL Apply 1 application topically 2 (two) times a week. Apply to affected area lather, leave in place for 5 mins and then rinse off with water   ketoconazole 2 % cream Commonly known as: NIZORAL Apply 1 application topically daily. To seborrhea/face lesion as needed.   levocetirizine 5 MG tablet Commonly known as: XYZAL Take 5 mg by mouth daily.   lidocaine 5 % Commonly known as: Lidoderm Place 1 patch onto the skin daily. Remove & Discard patch within 12 hours or as directed by MD   medroxyPROGESTERone 150 MG/ML injection Commonly known as: DEPO-PROVERA Inject 150 mg into the muscle every 3 (three) months.   meloxicam 15 MG tablet Commonly known as: Mobic Take 1 tablet (15 mg total) by mouth daily.   olopatadine 0.1 % ophthalmic solution Commonly known as: Patanol Place 1 drop into both eyes  2 (two) times daily.   ondansetron 4 MG disintegrating tablet Commonly known as: Zofran ODT Take 1 tablet (4 mg total) by mouth every 8 (eight) hours as needed for nausea or vomiting.   Pen Needles 32G X 6 MM Misc Use to inject insulin daily What changed:  how much to take how to take this when to take this   potassium chloride 10 MEQ tablet Commonly known as: KLOR-CON TAKE 1 TABLET BY MOUTH EVERY DAY   rizatriptan 10 MG tablet Commonly known as: MAXALT Take 1 tablet (10 mg total) by mouth 3 (three) times daily as needed for migraine.   Semglee (yfgn) 100 UNIT/ML Pen Generic drug: insulin glargine-yfgn Inject 24 Units into the skin daily.   Semglee (yfgn) 100 UNIT/ML Pen Generic drug: insulin glargine-yfgn INJECT 40 UNITS INTO THE SKIN DAILY AT 2 PM.   sitaGLIPtin 100 MG tablet Commonly known as: Januvia Take 1 tablet (100 mg total) by mouth daily.   Symbicort 160-4.5 MCG/ACT inhaler Generic drug: budesonide-formoterol INHALE 2 PUFFS INTO THE LUNGS DAILY.   tamsulosin 0.4 MG Caps capsule Commonly known as:  FLOMAX Take 0.4 mg by mouth daily.   triamcinolone cream 0.1 % Commonly known as: KENALOG Apply 1 application topically 2 (two) times daily. To affected areas on face, scalp as needed.         OBJECTIVE:   Vital Signs: BP 120/80 (BP Location: Left Arm, Patient Position: Sitting, Cuff Size: Small)    Pulse 84    Ht $R'5\' 4"'zT$  (1.626 m)    Wt 153 lb 6.4 oz (69.6 kg)    SpO2 96%    BMI 26.33 kg/m   Wt Readings from Last 3 Encounters:  08/17/21 153 lb 6.4 oz (69.6 kg)  08/10/21 152 lb (68.9 kg)  06/16/21 152 lb (68.9 kg)     Exam: General: Pt appears well and is in NAD  Lungs: Clear with good BS bilat with no rales, rhonchi, or wheezes  Heart: RRR with normal S1 and S2 and no gallops; no murmurs; no rub  Abdomen: Normoactive bowel sounds, soft, nontender, without masses or organomegaly palpable  Extremities: No pretibial edema.   Neuro: MS is good with appropriate affect, pt is alert and Ox3     DM foot exam: 08/17/2021   The skin of the feet is intact without sores or ulcerations. The pedal pulses are 2+ on right and 2+ on left. The sensation is intact to a screening 5.07, 10 gram monofilament bilaterally    DATA REVIEWED:  Lab Results  Component Value Date   HGBA1C 7.7 (A) 08/23/2020   HGBA1C 7.5 (A) 06/01/2020   HGBA1C 8.0 (A) 04/21/2020   A1c 6.5% 06/27/2021       Glutamic Acid Decarb Ab 0.0 - 5.0 U/mL <5.0    C-Peptide 1.1 - 4.4 ng/mL 8.3 High     Islet Cell Ab Neg:<1:1 Negative      ASSESSMENT / PLAN / RECOMMENDATIONS:   1) Type 2 Diabetes Mellitus, OPtimally controlled, Without complications - Most recent A1c of 6.5  %. Goal A1c < %.      - A1c optimal but the pt does not check glucose  and has not been to our office in a year. She denies any symptoms of hypoglycemia  - She is asking to reduce her Januvia rather then insulin. I have recommended not making a change as her A1c is ideal but she would like to proceed  - Intolerant to Metformin and  Glipizide -  I have tried to prescribe SGLT-2 inhibitors ,but it caused her to be tested for COVID and now she doesn't want it   MEDICATIONS: Decrease Januvia 100 mg , half a tablet daily Continue Semglee  24 units daily    EDUCATION / INSTRUCTIONS: BG monitoring instructions: Patient is instructed to check her blood sugars 2 times a day. Call [facility] if: BG persistently < 70  I reviewed the Rule of 15 for the treatment of hypoglycemia in detail with the patient. Literature supplied.    2) Diabetic complications:  Eye: Does not have known diabetic retinopathy. Urged to have an eye exam  Neuro/ Feet: Does not have known diabetic peripheral neuropathy .  Renal: Patient does not have known baseline CKD.   F/U in 3 months  Signed electronically by: Mack Guise, MD  [facility] Endocrinology  [facility] [address] [place], [place] [address] Phone: [phone] FAX: [phone]   CC: [address], [place] [address] Phone: [phone]  Fax: [phone]  Return to Endocrinology clinic as below: Future Appointments  Date Time Provider [place]  08/31/2021  4:15 PM Vanetta Mulders, MD [facility] [place]  09/05/2021  3:15 PM Bernardo Heater, Ronda Fairly, MD [facility] None  09/13/2021  8:00 AM Daleen Bo, PT [facility]  01/18/2022  7:45 AM Suzzanne Cloud, NP [facility] None

## 2021-08-17 NOTE — Patient Instructions (Addendum)
-   Continue Semglee at  24 units daily  - Decrease  Januvia 100 mg ,Half a  tablet a day     HOW TO TREAT LOW BLOOD SUGARS (Blood sugar LESS THAN 70 MG/DL) Please follow the RULE OF 15 for the treatment of hypoglycemia treatment (when your (blood sugars are less than 70 mg/dL)   STEP 1: Take 15 grams of carbohydrates when your blood sugar is low, which includes:  3-4 GLUCOSE TABS  OR 3-4 OZ OF JUICE OR REGULAR SODA OR ONE TUBE OF GLUCOSE GEL    STEP 2: RECHECK blood sugar in 15 MINUTES STEP 3: If your blood sugar is still low at the 15 minute recheck --> then, go back to STEP 1 and treat AGAIN with another 15 grams of carbohydrates.

## 2021-08-23 ENCOUNTER — Telehealth: Payer: Self-pay | Admitting: Orthopaedic Surgery

## 2021-08-23 NOTE — Telephone Encounter (Signed)
Pt state that her pain medication is not working can she get something different?   Cb [phone]

## 2021-08-26 ENCOUNTER — Encounter (HOSPITAL_BASED_OUTPATIENT_CLINIC_OR_DEPARTMENT_OTHER): Payer: Self-pay

## 2021-08-26 ENCOUNTER — Other Ambulatory Visit (HOSPITAL_BASED_OUTPATIENT_CLINIC_OR_DEPARTMENT_OTHER): Payer: Self-pay

## 2021-08-26 ENCOUNTER — Other Ambulatory Visit: Payer: Self-pay

## 2021-08-26 ENCOUNTER — Ambulatory Visit (INDEPENDENT_AMBULATORY_CARE_PROVIDER_SITE_OTHER): Payer: 59 | Admitting: Orthopaedic Surgery

## 2021-08-26 DIAGNOSIS — M25559 Pain in unspecified hip: Secondary | ICD-10-CM | POA: Diagnosis not present

## 2021-08-26 DIAGNOSIS — M25551 Pain in right hip: Secondary | ICD-10-CM

## 2021-08-26 NOTE — Progress Notes (Signed)
Chief Complaint: Right lower back and hip pain     History of Present Illness:   08/26/2021: Presents today for follow-up.  Continues to have persistent right lower back pain.  There is occasional radiation to the front of the hip.  She is not having dramatic difficulty sleeping.   Sandra Moody is a 51 y.o. female presents with right lower back and hip pain since August 2022.  This is been significantly limiting her mobility and activity.  Her pain got so back where she ultimately saw a emergency doctor the day prior.  She has not had any injections or physical therapy.  She is taking Tylenol as well as Robaxin.  She has been taking lidocaine patch hips which do help significantly.  She says that the pain awakens her at night.  She is unable to lay on the side.  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    Surgical History:   None  PMH/PSH/Family History/Social History/Meds/Allergies:    Past Medical History:  Diagnosis Date   Acute renal disease    Asthma    Bilateral carpal tunnel syndrome 03/11/2019   Common migraine with intractable migraine 08/29/2018   Daily headache    (03/08/2017)   Essential hypertension 02/20/2019   GERD (gastroesophageal reflux disease)    H pylori ulcer ~ 08/2016   History of hiatal hernia    Leaky heart valve ~ 09/2016   Migraine    "maybe weekly" (03/08/2017)   PONV (postoperative nausea and vomiting)    Type 2 diabetes mellitus with hyperglycemia, without long-term current use of insulin ([place]) 02/20/2019   Past Surgical History:  Procedure Laterality Date   CARPAL TUNNEL RELEASE Left 12/18/2019   Procedure: CARPAL TUNNEL RELEASE;  Surgeon: [name], MD;  Location: [place];  Service: Orthopedics;  Laterality: Left;   CARPAL TUNNEL RELEASE Right 07/22/2020   Procedure: CARPAL TUNNEL RELEASE  RIGHT;  Surgeon: [name], MD;  Location: [place];  Service: Orthopedics;  Laterality: Right;   CHOLECYSTECTOMY N/A 03/08/2017   Procedure: LAPAROSCOPIC CHOLECYSTECTOMY;  Surgeon: Coralie Keens, MD;  Location: [place];  Service: General;  Laterality: N/A;   DILATION AND CURETTAGE OF UTERUS     LAPAROSCOPIC CHOLECYSTECTOMY  03/08/2017   SHOULDER ARTHROSCOPY WITH SUBACROMIAL DECOMPRESSION, ROTATOR CUFF REPAIR AND BICEP TENDON REPAIR Right 07/22/2020   Procedure: SHOULDER ARTHROSCOPY WITH SUBACROMIAL DECOMPRESSION AND DISTAL CLAVICLE EXCISION, ROTATOR CUFF REPAIR AND BICEPS TENODESIS;  Surgeon: Hiram Gash, MD;  Location: [place];  Service: Orthopedics;  Laterality: Right;   TRIGGER FINGER RELEASE Left 12/18/2019   Procedure: RELEASE TRIGGER FINGER/A-1 PULLEY;  Surgeon: [name], MD;  Location: [place];  Service: Orthopedics;  Laterality: Left;   TUBAL LIGATION     Social History   Socioeconomic History   Marital status: Significant Other    Spouse name: Not on file   Number of children: 3   Years of education: Not on file   Highest education level: 12th grade  Occupational History    Comment: [employer]   Tobacco Use   Smoking status: Never   Smokeless tobacco: Never  Vaping Use   Vaping Use: Never used  Substance and Sexual Activity   Alcohol use: Not Currently    Comment: 03/08/2017 "might have a drink on a holiday"   Drug use: No   Sexual activity: Yes    Partners: Male    Birth control/protection: Injection  Other Topics Concern   Not on file  Social History Narrative   ** Merged History Encounter **   Right handed    Lives at home with significant other 2 cups daily of caffeine    Social Determinants of Health   Financial Resource Strain: Not on file  Food Insecurity: Not on file  Transportation Needs: Not on file  Physical Activity: Not on file  Stress: Not on file  Social Connections: Not on file    Family History  Problem Relation Age of Onset   Thyroid disease Mother    Diabetes Maternal Grandmother    Heart disease Maternal Grandfather    Allergies  Allergen Reactions   Ginger Itching and Swelling   Passion Fruit Flavor [Flavoring Agent] Itching and Swelling   Shellfish Allergy Anaphylaxis   Kiwi Extract Other (See Comments)   Other Other (See Comments)    ALLERGIC TO ALL TREE NUTS ALLERGIC TO ALL TREE NUTS   Tamiflu [Oseltamivir Phosphate]    Current Outpatient Medications  Medication Sig Dispense Refill   Accu-Chek FastClix Lancets MISC USE TO CHECK GLUCOSE TWICE A DAY ,FASTING AND 2 HOURS AFTER DINNER (Patient taking differently: 1 each by Other route See admin instructions. USE TO CHECK GLUCOSE TWICE A DAY ,FASTING AND 2 HOURS AFTER DINNER) 102 each 5   ACCU-CHEK GUIDE test strip USE TO CHECK GLUCOSE TWICE DAILY,FASTING AND 2 HOURS AFTER DINNER (Patient taking differently: 1 each by Other route See admin instructions. USE TO CHECK GLUCOSE TWICE DAILY,FASTING AND 2 HOURS AFTER DINNER) 100 strip 5   Accu-Chek Softclix Lancets lancets Use as instructed to check blood sugar 2 times a day (Patient taking differently: 1 each by Other route See admin instructions. Use as instructed to check blood sugar 2 times a day) 100 each 5   albuterol (PROVENTIL) (2.5 MG/3ML) 0.083% nebulizer solution Take 3 mLs (2.5 mg total) by nebulization every 6 (six) hours as needed for wheezing or shortness of breath. 75 mL 1   azelastine (OPTIVAR) 0.05 % ophthalmic solution Place 1 drop into both eyes daily.     BIOTIN PO Take 1 tablet by mouth daily.     blood glucose meter kit and supplies Per insurance preference. Use to check glucose twice a day (fasting and 2 hours after dinner)  Dx E11.65 (Patient taking differently: 1 each by Other route See admin instructions. Per insurance preference. Use to check glucose twice a day (fasting and 2 hours after dinner)  Dx E11.65) 1 each 5   clobetasol  ointment (TEMOVATE) 0.05 % APPLY TOPICALLY TWICE A DAY AS NEEDED FOR ITCHY RASH AS NEEDED 30 g 0   Galcanezumab-gnlm (EMGALITY) 120 MG/ML SOAJ Inject 120 mg into the skin every 30 (thirty) days. 1 mL 11   insulin glargine-yfgn (SEMGLEE, YFGN,) 100 UNIT/ML Pen INJECT 40 UNITS INTO THE SKIN DAILY AT 2 PM. 15 mL 0   Insulin Pen Needle 31G X 5 MM MISC 1 Device by Does not apply route daily in the afternoon. 100 each 3   ketoconazole (NIZORAL) 2 % cream Apply 1 application topically daily. To seborrhea/face lesion as needed. 15 g 0   ketoconazole (NIZORAL) 2 % shampoo Apply 1 application topically 2 (two) times a week.  Apply to affected area lather, leave in place for 5 mins and then rinse off with water (Patient not taking: Reported on 08/17/2021) 120 mL 5   levocetirizine (XYZAL) 5 MG tablet Take 5 mg by mouth daily.     lidocaine (LIDODERM) 5 % Place 1 patch onto the skin daily. Remove & Discard patch within 12 hours or as directed by MD 30 patch 0   medroxyPROGESTERone (DEPO-PROVERA) 150 MG/ML injection Inject 150 mg into the muscle every 3 (three) months.     meloxicam (MOBIC) 15 MG tablet Take 1 tablet (15 mg total) by mouth daily. 30 tablet 0   olopatadine (PATANOL) 0.1 % ophthalmic solution Place 1 drop into both eyes 2 (two) times daily. 5 mL 12   ondansetron (ZOFRAN ODT) 4 MG disintegrating tablet Take 1 tablet (4 mg total) by mouth every 8 (eight) hours as needed for nausea or vomiting. 20 tablet 0   potassium chloride (KLOR-CON) 10 MEQ tablet TAKE 1 TABLET BY MOUTH EVERY DAY 90 tablet 1   rizatriptan (MAXALT) 10 MG tablet Take 1 tablet (10 mg total) by mouth 3 (three) times daily as needed for migraine. 6 tablet 11   SEMGLEE, YFGN, 100 UNIT/ML Pen Inject 24 Units into the skin daily. 30 mL 3   sitaGLIPtin (JANUVIA) 100 MG tablet Take 0.5 tablets (50 mg total) by mouth daily. 45 tablet 3   SYMBICORT 160-4.5 MCG/ACT inhaler INHALE 2 PUFFS INTO THE LUNGS DAILY. (Patient taking differently: Inhale  2 puffs into the lungs daily.) 10.2 Inhaler 0   tamsulosin (FLOMAX) 0.4 MG CAPS capsule Take 0.4 mg by mouth daily. (Patient not taking: Reported on 08/17/2021)     triamcinolone (KENALOG) 0.1 % Apply 1 application topically 2 (two) times daily. To affected areas on face, scalp as needed. 30 g 0   No current facility-administered medications for this visit.   No results found.  Review of Systems:   A ROS was performed including pertinent positives and negatives as documented in the HPI.  Physical Exam :   Constitutional: NAD and appears stated age Neurological: Alert and oriented Psych: Appropriate affect and cooperative There were no vitals taken for this visit.   Comprehensive Musculoskeletal Exam:    Tenderness to palpation over the right lower back area.  No midline tenderness.  There is patient with flexion internal rotation of the right hip.  There is also pain with external rotation of the right hip.  The pain is somewhat located in a C shape distribution about the groin although there is more so pain in the right lower back area.  Imaging:   Xray (3 views right hip): Normal   I personally reviewed and interpreted the radiographs.   Assessment:   51 year old female with right lower back pain as well as hip pain.  At this time she is quite limited in her activity.  This time I would like to order an MRI to specifically to assess for underlying AVN as well as to look at her SI and lumbar spine.  I do believe this would be significantly beneficial if she continues to not improve with conservative management and she is having significant pain at night and is not able to be active.  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  Vanetta Mulders, MD Attending Physician, Orthopedic Surgery  This document was dictated using Dragon voice recognition software. A reasonable attempt at proof reading has been made to minimize errors.

## 2021-08-31 ENCOUNTER — Ambulatory Visit (HOSPITAL_BASED_OUTPATIENT_CLINIC_OR_DEPARTMENT_OTHER): Payer: 59 | Admitting: Orthopaedic Surgery

## 2021-09-05 ENCOUNTER — Ambulatory Visit: Payer: Self-pay | Admitting: Urology

## 2021-09-06 ENCOUNTER — Ambulatory Visit (HOSPITAL_COMMUNITY)
Admission: RE | Admit: 2021-09-06 | Discharge: 2021-09-06 | Disposition: A | Discharge status: Home / Self Care | Payer: 59 | Source: Ambulatory Visit | Attending: Orthopaedic Surgery | Admitting: Orthopaedic Surgery

## 2021-09-06 ENCOUNTER — Other Ambulatory Visit: Payer: Self-pay

## 2021-09-06 DIAGNOSIS — M25559 Pain in unspecified hip: Secondary | ICD-10-CM | POA: Diagnosis present

## 2021-09-06 DIAGNOSIS — M25551 Pain in right hip: Secondary | ICD-10-CM | POA: Insufficient documentation

## 2021-09-06 DIAGNOSIS — M25552 Pain in left hip: Secondary | ICD-10-CM | POA: Insufficient documentation

## 2021-09-06 DIAGNOSIS — M47816 Spondylosis without myelopathy or radiculopathy, lumbar region: Secondary | ICD-10-CM | POA: Diagnosis not present

## 2021-09-06 MED ORDER — GADOBUTROL 1 MMOL/ML IV SOLN
7.0000 mL | Freq: Once | INTRAVENOUS | Status: AC | PRN
  Administered 2021-09-06: 7 mL via INTRAVENOUS

## 2021-09-09 ENCOUNTER — Ambulatory Visit: Payer: Self-pay | Admitting: Urology

## 2021-09-13 ENCOUNTER — Ambulatory Visit (HOSPITAL_BASED_OUTPATIENT_CLINIC_OR_DEPARTMENT_OTHER): Payer: Self-pay | Admitting: Physical Therapy

## 2021-09-19 ENCOUNTER — Ambulatory Visit (HOSPITAL_BASED_OUTPATIENT_CLINIC_OR_DEPARTMENT_OTHER): Payer: 59 | Admitting: Physical Therapy

## 2021-09-22 ENCOUNTER — Ambulatory Visit (INDEPENDENT_AMBULATORY_CARE_PROVIDER_SITE_OTHER): Payer: 59 | Admitting: Urology

## 2021-09-22 ENCOUNTER — Other Ambulatory Visit: Payer: Self-pay

## 2021-09-22 ENCOUNTER — Encounter: Payer: Self-pay | Admitting: Urology

## 2021-09-22 VITALS — BP 134/80 | HR 89 | Ht 64.0 in | Wt 150.0 lb

## 2021-09-22 DIAGNOSIS — R3915 Urgency of urination: Secondary | ICD-10-CM

## 2021-09-22 DIAGNOSIS — Z87442 Personal history of urinary calculi: Secondary | ICD-10-CM

## 2021-09-22 DIAGNOSIS — N200 Calculus of kidney: Secondary | ICD-10-CM

## 2021-09-22 LAB — URINALYSIS, COMPLETE
Bilirubin, UA: NEGATIVE
Glucose, UA: NEGATIVE
Leukocytes,UA: NEGATIVE
Nitrite, UA: NEGATIVE
RBC, UA: NEGATIVE
Specific Gravity, UA: 1.025 (ref 1.005–1.030)
Urobilinogen, Ur: 0.2 mg/dL (ref 0.2–1.0)
pH, UA: 6 (ref 5.0–7.5)

## 2021-09-22 LAB — MICROSCOPIC EXAMINATION
Bacteria, UA: NONE SEEN
Epithelial Cells (non renal): NONE SEEN /hpf (ref 0–10)
RBC, Urine: NONE SEEN /hpf (ref 0–2)

## 2021-09-22 NOTE — Progress Notes (Signed)
09/22/2021 2:44 PM   Schuyler Amor Kelvin Cellar 06/17/1971 [id number]  Referring provider: Caryl Asp, NP [place],  [place] [address]  Chief Complaint  Patient presents with   Nephrolithiasis    HPI: Sandra Moody is a 51 y.o. female referred for evaluation of nephrolithiasis.  Recently evaluated at [facility] for stone disease.  Insurance recently changed and she desired transfer of care to a practice in her network. Initially seen 06/11/2019 after being seen at an urgent care clinic for low back pain treated with prednisone and ketorolac.    At that visit she was complaining of right flank pain and lower abdominal discomfort along with urinary urgency.  CT 06/08/2021 showed a 5 mm calcification along the course of the right distal ureter but it was felt to be equivocal if the stones were in the ureter. MET was recommended and she was scheduled for a follow-up KUB and renal ultrasound Prior to the follow-up was seen at [facility] ED 06/12/2021 complaining of dysuria, groin and flank pain.  CT repeated which showed mild right hydronephrosis but no calculus Urology follow-up 06/20/2021.  Ultrasound at that visit showed mild right calyceal fullness.  Follow-up CT performed 06/22/2021 which showed absence of a previously noted 5 mm calcification in the vicinity of the right distal ureter.  Follow-up ultrasound showed resolution of calyceal fullness/hydronephrosis Urology follow-up 06/30/2021 with complaints of persistent right flank/back pain.  CT urogram performed 07/29/2021 for persistent back pain.  There is no hydronephrosis or GU abnormalities.  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  It was felt her back pain was non-urologic in etiology She has had persistent low back pain.  She does have some urinary urgency which is not bothersome   PMH: Past Medical History:  Diagnosis Date   Acute renal disease     Asthma    Bilateral carpal tunnel syndrome 03/11/2019   Common migraine with intractable migraine 08/29/2018   Daily headache    (03/08/2017)   Essential hypertension 02/20/2019   GERD (gastroesophageal reflux disease)    H pylori ulcer ~ 08/2016   History of hiatal hernia    Leaky heart valve ~ 09/2016   Migraine    "maybe weekly" (03/08/2017)   PONV (postoperative nausea and vomiting)    Type 2 diabetes mellitus with hyperglycemia, without long-term current use of insulin ([place]) 02/20/2019    Surgical History: Past Surgical History:  Procedure Laterality Date   CARPAL TUNNEL RELEASE Left 12/18/2019   Procedure: CARPAL TUNNEL RELEASE;  Surgeon: [name], MD;  Location: [place];  Service: Orthopedics;  Laterality: Left;   CARPAL TUNNEL RELEASE Right 07/22/2020   Procedure: CARPAL TUNNEL RELEASE RIGHT;  Surgeon: [name], MD;  Location: [place];  Service: Orthopedics;  Laterality: Right;   CHOLECYSTECTOMY N/A 03/08/2017   Procedure: LAPAROSCOPIC CHOLECYSTECTOMY;  Surgeon: Coralie Keens, MD;  Location: [place];  Service: General;  Laterality: N/A;   DILATION AND CURETTAGE OF UTERUS     LAPAROSCOPIC CHOLECYSTECTOMY  03/08/2017   SHOULDER ARTHROSCOPY WITH SUBACROMIAL DECOMPRESSION, ROTATOR CUFF REPAIR AND BICEP TENDON REPAIR Right 07/22/2020   Procedure: SHOULDER ARTHROSCOPY WITH SUBACROMIAL DECOMPRESSION AND DISTAL CLAVICLE EXCISION, ROTATOR CUFF REPAIR AND BICEPS TENODESIS;  Surgeon: Hiram Gash, MD;  Location: [place];  Service: Orthopedics;  Laterality: Right;   TRIGGER FINGER RELEASE Left 12/18/2019   Procedure: RELEASE TRIGGER FINGER/A-1 PULLEY;  Surgeon: Fredna Dow,  Lennette Bihari, MD;  Location: [place];  Service: Orthopedics;  Laterality: Left;   TUBAL LIGATION      Home Medications:  Allergies as of 09/22/2021       Reactions   Ginger Itching, Swelling   Passion Fruit Flavor [flavoring Agent] Itching, Swelling    Shellfish Allergy Anaphylaxis   Kiwi Extract Other (See Comments)   Other Other (See Comments)   ALLERGIC TO ALL TREE NUTS ALLERGIC TO ALL TREE NUTS   Tamiflu [oseltamivir Phosphate]         Medication List        Accurate as of September 22, 2021  2:44 PM. If you have any questions, ask your nurse or doctor.          STOP taking these medications    clobetasol ointment 0.05 % Commonly known as: TEMOVATE Stopped by: Abbie Sons, MD   meloxicam 15 MG tablet Commonly known as: Mobic Stopped by: Abbie Sons, MD   olopatadine 0.1 % ophthalmic solution Commonly known as: Patanol Stopped by: Abbie Sons, MD   ondansetron 4 MG disintegrating tablet Commonly known as: Zofran ODT Stopped by: Abbie Sons, MD   potassium chloride 10 MEQ tablet Commonly known as: KLOR-CON Stopped by: Abbie Sons, MD   tamsulosin 0.4 MG Caps capsule Commonly known as: FLOMAX Stopped by: Abbie Sons, MD   triamcinolone cream 0.1 % Commonly known as: KENALOG Stopped by: Abbie Sons, MD       TAKE these medications    Accu-Chek Softclix Lancets lancets Use as instructed to check blood sugar 2 times a day What changed:  how much to take how to take this when to take this   Accu-Chek FastClix Lancets Misc USE TO CHECK GLUCOSE TWICE A DAY ,FASTING AND 2 HOURS AFTER DINNER What changed: See the new instructions.   Accu-Chek Guide test strip Generic drug: glucose blood USE TO CHECK GLUCOSE TWICE DAILY,FASTING AND 2 HOURS AFTER DINNER What changed: See the new instructions.   albuterol (2.5 MG/3ML) 0.083% nebulizer solution Commonly known as: PROVENTIL Take 3 mLs (2.5 mg total) by nebulization every 6 (six) hours as needed for wheezing or shortness of breath.   azelastine 0.05 % ophthalmic solution Commonly known as: OPTIVAR Place 1 drop into both eyes daily.   BIOTIN PO Take 1 tablet by mouth daily.   blood glucose meter kit and supplies Per  insurance preference. Use to check glucose twice a day (fasting and 2 hours after dinner)  Dx E11.65 What changed:  how much to take how to take this when to take this   Emgality 120 MG/ML Soaj Generic drug: Galcanezumab-gnlm Inject 120 mg into the skin every 30 (thirty) days.   Insulin Pen Needle 31G X 5 MM Misc 1 Device by Does not apply route daily in the afternoon.   ketoconazole 2 % shampoo Commonly known as: NIZORAL Apply 1 application topically 2 (two) times a week. Apply to affected area lather, leave in place for 5 mins and then rinse off with water   ketoconazole 2 % cream Commonly known as: NIZORAL Apply 1 application topically daily. To seborrhea/face lesion as needed.   levocetirizine 5 MG tablet Commonly known as: XYZAL Take 5 mg by mouth daily.   lidocaine 5 % Commonly known as: Lidoderm Place 1 patch onto the skin daily. Remove & Discard patch within 12 hours or as directed by MD   medroxyPROGESTERone 150 MG/ML injection Commonly known as:  DEPO-PROVERA Inject 150 mg into the muscle every 3 (three) months.   rizatriptan 10 MG tablet Commonly known as: MAXALT Take 1 tablet (10 mg total) by mouth 3 (three) times daily as needed for migraine.   Semglee (yfgn) 100 UNIT/ML Pen Generic drug: insulin glargine-yfgn INJECT 40 UNITS INTO THE SKIN DAILY AT 2 PM.   Semglee (yfgn) 100 UNIT/ML Pen Generic drug: insulin glargine-yfgn Inject 24 Units into the skin daily.   sitaGLIPtin 100 MG tablet Commonly known as: Januvia Take 0.5 tablets (50 mg total) by mouth daily.   Symbicort 160-4.5 MCG/ACT inhaler Generic drug: budesonide-formoterol INHALE 2 PUFFS INTO THE LUNGS DAILY.        Allergies:  Allergies  Allergen Reactions   Ginger Itching and Swelling   Passion Fruit Flavor [Flavoring Agent] Itching and Swelling   Shellfish Allergy Anaphylaxis   Kiwi Extract Other (See Comments)   Other Other (See Comments)    ALLERGIC TO ALL TREE NUTS ALLERGIC TO  ALL TREE NUTS   Tamiflu [Oseltamivir Phosphate]     Family History: Family History  Problem Relation Age of Onset   Thyroid disease Mother    Diabetes Maternal Grandmother    Heart disease Maternal Grandfather     Social History:  reports that she has never smoked. She has never used smokeless tobacco. She reports that she does not currently use alcohol. She reports that she does not use drugs.   Physical Exam: BP 134/80    Pulse 89    Ht 5' 4"  (1.626 m)    Wt 150 lb (68 kg)    BMI 25.75 kg/m   Constitutional:  Alert and oriented, No acute distress. HEENT: [place] AT, moist mucus membranes.  Trachea midline, no masses. Cardiovascular: No clubbing, cyanosis, or edema. Respiratory: Normal respiratory effort, no increased work of breathing. Psychiatric: Normal mood and affect.  Laboratory Data:  Urinalysis Microscopy negative WBC/RBC/bacteria   Assessment & Plan:    1.  Personal history urinary calculi Prior 5 mm distal calculus which has passed We discussed there is a 50% chance she may form another stone within the next 5 years.  General stone prevention guidelines were reviewed and she was provided literature.  It was recommended she drink enough water to keep urine output >2.5 L/day.  Dietary oxalate moderation, and increasing citrates in the diet were also discussed.  We also discussed a metabolic evaluation however she would prefer general stone prevention guidelines initially 31-monthfollow-up KUB  2.  Urinary urgency UA today unremarkable We discussed overactive bladder and if bothersome symptoms options of pelvic floor physical therapy and medical management   SAbbie Sons MD  [place] [place] [phone]

## 2021-09-23 ENCOUNTER — Encounter: Payer: Self-pay | Admitting: Urology

## 2021-09-23 ENCOUNTER — Ambulatory Visit (HOSPITAL_BASED_OUTPATIENT_CLINIC_OR_DEPARTMENT_OTHER): Payer: 59 | Admitting: Orthopaedic Surgery

## 2021-09-24 ENCOUNTER — Other Ambulatory Visit: Payer: Self-pay | Admitting: Internal Medicine

## 2021-09-27 ENCOUNTER — Other Ambulatory Visit (HOSPITAL_BASED_OUTPATIENT_CLINIC_OR_DEPARTMENT_OTHER): Payer: Self-pay | Admitting: Orthopaedic Surgery

## 2021-09-27 ENCOUNTER — Other Ambulatory Visit (HOSPITAL_BASED_OUTPATIENT_CLINIC_OR_DEPARTMENT_OTHER): Payer: Self-pay

## 2021-09-27 MED ORDER — LIDOCAINE 5 % EX PTCH
1.0000 | MEDICATED_PATCH | CUTANEOUS | 0 refills | Status: DC
  Filled 2021-09-27: qty 30, 30d supply, fill #0

## 2021-10-03 ENCOUNTER — Ambulatory Visit (HOSPITAL_BASED_OUTPATIENT_CLINIC_OR_DEPARTMENT_OTHER): Payer: 59 | Admitting: Physical Therapy

## 2021-10-04 ENCOUNTER — Other Ambulatory Visit: Payer: Self-pay

## 2021-10-04 ENCOUNTER — Ambulatory Visit (HOSPITAL_BASED_OUTPATIENT_CLINIC_OR_DEPARTMENT_OTHER): Payer: 59 | Attending: Orthopaedic Surgery | Admitting: Physical Therapy

## 2021-10-04 ENCOUNTER — Encounter (HOSPITAL_BASED_OUTPATIENT_CLINIC_OR_DEPARTMENT_OTHER): Payer: Self-pay | Admitting: Physical Therapy

## 2021-10-04 DIAGNOSIS — M25559 Pain in unspecified hip: Secondary | ICD-10-CM | POA: Insufficient documentation

## 2021-10-04 DIAGNOSIS — M25552 Pain in left hip: Secondary | ICD-10-CM | POA: Diagnosis present

## 2021-10-04 DIAGNOSIS — M6281 Muscle weakness (generalized): Secondary | ICD-10-CM | POA: Insufficient documentation

## 2021-10-04 DIAGNOSIS — R262 Difficulty in walking, not elsewhere classified: Secondary | ICD-10-CM | POA: Insufficient documentation

## 2021-10-04 DIAGNOSIS — M545 Low back pain, unspecified: Secondary | ICD-10-CM | POA: Insufficient documentation

## 2021-10-04 DIAGNOSIS — M25551 Pain in right hip: Secondary | ICD-10-CM | POA: Diagnosis present

## 2021-10-04 NOTE — Therapy (Signed)
OUTPATIENT PHYSICAL THERAPY THORACOLUMBAR EVALUATION   Patient Name: Sandra Moody MRN: [id number] DOB:06/13/1971, 51 y.o., female Today's Date: 10/04/2021   PT End of Session - 10/04/21 1800     Visit Number 1    Number of Visits 16    Date for PT Re-Evaluation 01/02/22    Authorization Type [employer]    PT Start Time 1600    PT Stop Time 1645    PT Time Calculation (min) 45 min    Activity Tolerance Patient tolerated treatment well;Patient limited by pain    Behavior During Therapy [facility] for tasks assessed/performed             Past Medical History:  Diagnosis Date   Acute renal disease    Asthma    Bilateral carpal tunnel syndrome 03/11/2019   Common migraine with intractable migraine 08/29/2018   Daily headache    (03/08/2017)   Essential hypertension 02/20/2019   GERD (gastroesophageal reflux disease)    H pylori ulcer ~ 08/2016   History of hiatal hernia    Leaky heart valve ~ 09/2016   Migraine    "maybe weekly" (03/08/2017)   PONV (postoperative nausea and vomiting)    Type 2 diabetes mellitus with hyperglycemia, without long-term current use of insulin ([place]) 02/20/2019   Past Surgical History:  Procedure Laterality Date   CARPAL TUNNEL RELEASE Left 12/18/2019   Procedure: CARPAL TUNNEL RELEASE;  Surgeon: [name], MD;  Location: [place];  Service: Orthopedics;  Laterality: Left;   CARPAL TUNNEL RELEASE Right 07/22/2020   Procedure: CARPAL TUNNEL RELEASE RIGHT;  Surgeon: [name], MD;  Location: [place];  Service: Orthopedics;  Laterality: Right;   CHOLECYSTECTOMY N/A 03/08/2017   Procedure: LAPAROSCOPIC CHOLECYSTECTOMY;  Surgeon: Coralie Keens, MD;  Location: [place];  Service: General;  Laterality: N/A;   DILATION AND CURETTAGE OF UTERUS     LAPAROSCOPIC CHOLECYSTECTOMY  03/08/2017   SHOULDER ARTHROSCOPY WITH SUBACROMIAL DECOMPRESSION, ROTATOR CUFF REPAIR AND BICEP TENDON REPAIR Right 07/22/2020   Procedure:  SHOULDER ARTHROSCOPY WITH SUBACROMIAL DECOMPRESSION AND DISTAL CLAVICLE EXCISION, ROTATOR CUFF REPAIR AND BICEPS TENODESIS;  Surgeon: Hiram Gash, MD;  Location: [place];  Service: Orthopedics;  Laterality: Right;   TRIGGER FINGER RELEASE Left 12/18/2019   Procedure: RELEASE TRIGGER FINGER/A-1 PULLEY;  Surgeon: [name], MD;  Location: [place];  Service: Orthopedics;  Laterality: Left;   TUBAL LIGATION     Patient Active Problem List   Diagnosis Date Noted   Entrapment of left ulnar nerve 04/28/2020   Diabetic polyneuropathy associated with type 2 diabetes mellitus ([place]) 03/05/2020   Type 2 diabetes mellitus without complication, without long-term current use of insulin ([place]) 09/05/2019   Trigger finger of left thumb 04/30/2019   Infection of flexor tendon sheath 04/15/2019   Carpal tunnel syndrome, bilateral 03/11/2019   Essential hypertension 02/20/2019   Type 2 diabetes mellitus with hyperglycemia, without long-term current use of insulin ([place]) 02/20/2019   Other acute sinusitis 12/25/2018   Irritable bowel syndrome 12/04/2018   Migraine with aura 12/04/2018   Nonintractable common migraine 08/29/2018   Moderate persistent asthma without complication [id number]   Seasonal and perennial allergic rhinitis 06/28/2017   Mild persistent asthma with acute exacerbation 06/28/2017   Gastroesophageal reflux disease 06/28/2017   Biliary dyskinesia 03/08/2017   Asthma 03/02/2016   Asthma with acute exacerbation 10/04/2015   Other and unspecified ovarian cyst 06/17/2012    PCP: [facility], [facility]  REFERRING PROVIDER: Pa, [facility]  REFERRING DIAG: M25.559 (ICD-10-CM) - Hip pain   THERAPY DIAG:  Pain, lumbar region  Muscle weakness (generalized)  Difficulty walking  Bilateral hip pain  ONSET DATE: 02/2021  SUBJECTIVE:                                                                                                                                                                                            SUBJECTIVE STATEMENT: Pt states that the pain is very bad and she requires the pain patch and pain medications. Pt states she dealt with kidney stones until December of last year. Pt states that the MRI shows defects of the R hip. She states that getting out of bed is painful. Pt states the L quad will be jumpy. She heads popping on the L and R hip.  Pt states she has trouble with lifting at work. Pt will feel NT into bilat legs into calves. Pt states she has lost 12lbs unintentionally. Pt states pain wakes her up at night as well. She also has night sweats when kidney stones were going on. Pt states she has history of breast cancer. Pt does self exams and did not notice unusual lumps.   Pt sees Dr. Calla Kicks at [facility].   PERTINENT HISTORY:  DM2, AVN, HTN, Asthma  PAIN:  Are you having pain? Yes NPRS scale: 5/10 Pain location: R and R hip Pain orientation: Right and Left  PAIN TYPE: stabbing pain  Pain description: constant  Aggravating factors: bending too long, sitting for too long, squatting, vacuuming Relieving factors: resting, pain, meds   PRECAUTIONS: None  WEIGHT BEARING RESTRICTIONS No  FALLS:  Has patient fallen in last 6 months? No, Number of falls: 0  LIVING ENVIRONMENT: Lives with: lives alone Lives in: House/apartment Stairs: Yes; 3 steps    OCCUPATION: Works on a assembly/conveyor belt; light lifting   PLOF: Independent  PATIENT GOALS: Pt would like to get normal sleep again.    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   IMPRESSION: 1. Mild-to-moderate bilateral facet hypertrophy at L4-5 and L5-S1, right worse than left. 2. Otherwise unremarkable MRI of the lumbar spine. No significant disc pathology, stenosis, or evidence for neural  impingement.   PATIENT SURVEYS:  FOTO 36 63 at D/C  19pt MCII   SCREENING FOR RED FLAGS: Bowel or bladder incontinence: No Spinal tumors: No Cauda equina syndrome: No Compression fracture: No   COGNITION:  Overall cognitive status: Within functional limits for tasks assessed     SENSATION:  Light touch: Deficits      POSTURE:  Mild femor IR in standing   LUMBARAROM/PROM: pain in all planes  A/PROM A/PROM  10/04/2021  Flexion 50%  Extension 50%  Right lateral flexion 50%  Left lateral flexion 50%  Right rotation 50%  Left rotation 50%   (Blank rows = not tested)  LE AROM/PROM: WFL for PROM but painful at end range into back and C sign pain in L hip; moderately limited in all planes bilaterally with AROM due to pain into L/S  LE MMT:  MMT Right 10/04/2021 Left 10/04/2021  Hip flexion 4+/5 4-/5  Hip extension    Hip abduction 4+/5 4/5  Hip adduction 4+/5 4/5  Knee flexion 4+/5 4/5  Knee extension 4+/5 4/5   (Blank rows = not tested)  LUMBAR SPECIAL TESTS:  Straight leg raise test: Negative and SI Compression/distraction test: Negative  Reflexes:  L3: 2+ bilat S1: 2+ on R, 1+ on L   (-) Hoffman's (-) Clonus at LE  FUNCTIONAL TESTS:  5 times sit to stand: unable to perform without UE  GAIT: Distance walked: 33ft Assistive device utilized: None Level of assistance: Complete Independence Comments: decreased step length, decrease L hip extension, fwd flexed posture    TODAY'S TREATMENT    Exercises Supine Posterior Pelvic Tilt - 2 x daily - 7 x weekly - 2 sets - 10 reps - 2 hold Supine Bridge - 2 x daily - 7 x weekly - 1 sets - 5 reps Seated Hip Abduction with Resistance - 2 x daily - 7 x weekly - 2 sets - 10 reps    PATIENT EDUCATION:  Education details: MOI, diagnosis, prognosis, anatomy, exercise progression, DOMS expectations, muscle firing,  envelope of function, HEP, POC  Person educated: Patient Education method: Explanation Education  comprehension: verbalized understanding, returned demonstration, verbal cues required, and tactile cues required   HOME EXERCISE PROGRAM: Access Code: [facility] URL: [email or web address] Date: 10/04/2021 Prepared by: Daleen Bo  ASSESSMENT:  CLINICAL IMPRESSION: Patient is a 51 y.o. female who was seen today for physical therapy evaluation and treatment for cc of R sided LBP and hip pain. Pt's hip pain appear consistent with history of AVN and LBP may be related to a lumbar radiculopathy though imaging does not show significant findings. Pt does report red flag responses to potential malignancy as well as abnormal presentation during eval- particularly significant weakness and tetany with hip extension as well as diminished reflexes on L. Pt advised to return to PCP for follow up and further testing- no immediate referral required but advised it be within next few days.     OBJECTIVE IMPAIRMENTS Abnormal gait, decreased activity tolerance, decreased balance, decreased mobility, difficulty walking, decreased ROM, decreased strength, hypomobility, impaired perceived functional ability, impaired flexibility, improper body mechanics, postural dysfunction, and pain.   ACTIVITY LIMITATIONS cleaning, community activity, driving, meal prep, occupation, laundry, yard work, shopping, and exercise .   PERSONAL FACTORS Fitness, Past/current experiences, Time since onset of injury/illness/exacerbation, and 3+ comorbidities:    are also affecting patient's functional outcome.    REHAB POTENTIAL: Fair    CLINICAL DECISION MAKING: Unstable/unpredictable  EVALUATION COMPLEXITY: High   GOALS:   SHORT TERM GOALS:  Pt will become independent with HEP in order to demonstrate synthesis of PT education.   Target date: 11/15/2021 Goal status: INITIAL  2.  Pt will report at least 2 pt reduction on NPRS scale for pain in order to demonstrate functional improvement with household activity,  self  care, and ADL.   Target date: 11/15/2021 Goal status: INITIAL  3.  Pt will score at least 19 pt increase on FOTO to demonstrate functional improvement in MCII and pt perceived function.    Target date: 11/15/2021 Goal status: INITIAL    LONG TERM GOALS:  Pt  will become independent with final HEP in order to demonstrate synthesis of PT education.   Target date: 12/27/2021 Goal status: INITIAL  2.  Pt will be able to perform 5XSTS in under 12s  in order to demonstrate functional improvement above the cut off score for adults.  Target date: 12/27/2021 Goal status: INITIAL  3.  Pt will score >/= 63 on FOTO to demonstrate improvement in perceived lumbar and hip function.   Target date: 12/27/2021 Goal status: INITIAL  4.  Pt will be able to demonstrate/report ability to sit/stand/sleep for extended periods of time without pain in order to demonstrate functional improvement and tolerance to static positioning.    Target date: 12/27/2021 Goal status: INITIAL    PLAN: PT FREQUENCY: 1-2x/week  PT DURATION: 12 weeks (likely D/C by 8 wks)  PLANNED INTERVENTIONS: Therapeutic exercises, Therapeutic activity, Neuromuscular re-education, Balance training, Gait training, Patient/Family education, Joint manipulation, Joint mobilization, Stair training, DME instructions, Aquatic Therapy, Dry Needling, Electrical stimulation, Spinal manipulation, Spinal mobilization, Cryotherapy, scar mobilization, Taping, Vasopneumatic device, Traction, Ultrasound, Ionotophoresis 4mg /ml Dexamethasone, and Manual therapy  PLAN FOR NEXT SESSION: review HEP, lumbar and hip ROM and strength as tolerated   Daleen Bo, PT 10/04/2021, 6:01 PM

## 2021-10-12 ENCOUNTER — Other Ambulatory Visit: Payer: Self-pay

## 2021-10-12 ENCOUNTER — Ambulatory Visit (INDEPENDENT_AMBULATORY_CARE_PROVIDER_SITE_OTHER): Payer: 59 | Admitting: Internal Medicine

## 2021-10-12 ENCOUNTER — Encounter: Payer: Self-pay | Admitting: Internal Medicine

## 2021-10-12 VITALS — BP 130/80 | HR 96 | Ht 64.0 in | Wt 149.8 lb

## 2021-10-12 DIAGNOSIS — E119 Type 2 diabetes mellitus without complications: Secondary | ICD-10-CM

## 2021-10-12 LAB — POCT GLYCOSYLATED HEMOGLOBIN (HGB A1C): Hemoglobin A1C: 6.3 % — AB (ref 4.0–5.6)

## 2021-10-12 NOTE — Patient Instructions (Signed)
?-   Continue  Januvia 100 mg ,daily   ?- You may stop the insulin and check sugars 3 times a week, if your sugar are consistently over 150 , please restart insulin at 18 units once daily  ? ? ? ?HOW TO TREAT LOW BLOOD SUGARS (Blood sugar LESS THAN 70 MG/DL) ?Please follow the RULE OF 15 for the treatment of hypoglycemia treatment (when your (blood sugars are less than 70 mg/dL)  ? ?STEP 1: Take 15 grams of carbohydrates when your blood sugar is low, which includes:  ?3-4 GLUCOSE TABS  OR ?3-4 OZ OF JUICE OR REGULAR SODA OR ?ONE TUBE OF GLUCOSE GEL   ? ?STEP 2: RECHECK blood sugar in 15 MINUTES ?STEP 3: If your blood sugar is still low at the 15 minute recheck --> then, go back to STEP 1 and treat AGAIN with another 15 grams of carbohydrates. ? ?

## 2021-10-12 NOTE — Progress Notes (Signed)
? ?Name: Sandra Moody  ?Age/ Sex: 51 y.o., female   ?MRN/ DOB: [id number], 28-May-1971    ? ?PCP: [facility], [facility]   ?Reason for Endocrinology Evaluation: Type 2 Diabetes Mellitus  ?Initial Endocrine Consultative Visit: 09/05/2019  ? ? ?PATIENT IDENTIFIER: Ms. Sandra Moody is a 51 y.o. female with a past medical history of asthma. The patient has followed with Endocrinology clinic since 09/05/2019 for consultative assistance with management of her diabetes. ? ?DIABETIC HISTORY:  ?Ms. Peterkin was diagnosed with DM in 09/2018.  She has been on multiple oral glycemic agents with reported intolerance.   Glipizide caused stomach issues as well as Metformin.  Actos caused hypoglycemia and was discontinued in 07/2019 , she has not been on insulin before. Her hemoglobin A1c has ranged from 6.1% in 08/2019, peaking at 10.3%  in 2020 ? ?On her initial visit to our clinic she was not on any oral glycemic agents, and declined any oral medications at the time.  By 12/2019 she presented to her PCP with hypoglycemia and BG is in the 300s patient was started on Januvia at the time. ? ? ?Started Jardiance 07/2020 but she does not want it  ? ? ?SUBJECTIVE:  ? ?During the last visit (08/17/2020): A1c 6.5 %, Januvia reduced per her request  ? ? ?Today (10/12/2021): [name] is here for follow-up on diabetes management.   She checks her blood sugars 0 times daily. ? ?Pt is here to discuss nausea every time she takes insulin. She has been on insulin for ~2 yrs, nausea started 3 days ago  ?She also has noted worsening night sweats  ?Denies vomiting or diarrhea  ?Has hx of kidney stones but does not believe this is the issue ?Denies fever , denies urinary symptoms  ?On her last visit she asked to cut her Januvia in half but today she tells me she couldn't cut the and continued on 1 tablet  ? ? ? ?HOME DIABETES REGIMEN:  ?Januvia 100 mg ,1 tablet  daily  ?Semglee 24 units daily  ? ? ? ?METER DOWNLOAD SUMMARY: Does not check   ? ? ? ? ? ?DIABETIC COMPLICATIONS: ?Microvascular complications:  ? Denies: neuropathy , retinopathy,  ?Last eye exam: Completed 09/2019 ?  ?Macrovascular complications:  ?  ?Denies: CAD, PVD, CVA ? ?HISTORY:  ?Past Medical History:  ?Past Medical History:  ?Diagnosis Date  ? Acute renal disease   ? Asthma   ? Bilateral carpal tunnel syndrome 03/11/2019  ? Common migraine with intractable migraine 08/29/2018  ? Daily headache   ? (03/08/2017)  ? Essential hypertension 02/20/2019  ? GERD (gastroesophageal reflux disease)   ? H pylori ulcer ~ 08/2016  ? History of hiatal hernia   ? Leaky heart valve ~ 09/2016  ? Migraine   ? "maybe weekly" (03/08/2017)  ? PONV (postoperative nausea and vomiting)   ? Type 2 diabetes mellitus with hyperglycemia, without long-term current use of insulin ([place]) 02/20/2019  ? ?Past Surgical History:  ?Past Surgical History:  ?Procedure Laterality Date  ? CARPAL TUNNEL RELEASE Left 12/18/2019  ? Procedure: CARPAL TUNNEL RELEASE;  Surgeon: [name], MD;  Location: [place];  Service: Orthopedics;  Laterality: Left;  ? CARPAL TUNNEL RELEASE Right 07/22/2020  ? Procedure: CARPAL TUNNEL RELEASE RIGHT;  Surgeon: [name], MD;  Location: [place];  Service: Orthopedics;  Laterality: Right;  ? CHOLECYSTECTOMY N/A 03/08/2017  ? Procedure: LAPAROSCOPIC CHOLECYSTECTOMY;  Surgeon: Coralie Keens, MD;  Location:  [facility];  Service: General;  Laterality: N/A;  ? DILATION AND CURETTAGE OF UTERUS    ? LAPAROSCOPIC CHOLECYSTECTOMY  03/08/2017  ? SHOULDER ARTHROSCOPY WITH SUBACROMIAL DECOMPRESSION, ROTATOR CUFF REPAIR AND BICEP TENDON REPAIR Right 07/22/2020  ? Procedure: SHOULDER ARTHROSCOPY WITH SUBACROMIAL DECOMPRESSION AND DISTAL CLAVICLE EXCISION, ROTATOR CUFF REPAIR AND BICEPS TENODESIS;  Surgeon: Hiram Gash, MD;  Location: [place];  Service: Orthopedics;  Laterality: Right;  ? TRIGGER FINGER RELEASE Left 12/18/2019  ? Procedure: RELEASE TRIGGER  FINGER/A-1 PULLEY;  Surgeon: [name], MD;  Location: [place];  Service: Orthopedics;  Laterality: Left;  ? TUBAL LIGATION    ? ?Social History:  reports that she has never smoked. She has never used smokeless tobacco. She reports that she does not currently use alcohol. She reports that she does not use drugs. ?Family History:  ?Family History  ?Problem Relation Age of Onset  ? Thyroid disease Mother   ? Diabetes Maternal Grandmother   ? Heart disease Maternal Grandfather   ? ? ? ?HOME MEDICATIONS: ?Allergies as of 10/12/2021   ? ?   Reactions  ? Ginger Itching, Swelling  ? Passion Fruit Flavor [flavoring Agent] Itching, Swelling  ? Shellfish Allergy Anaphylaxis  ? Kiwi Extract Other (See Comments)  ? Other Other (See Comments)  ? ALLERGIC TO ALL TREE NUTS ?ALLERGIC TO ALL TREE NUTS  ? Tamiflu [oseltamivir Phosphate]   ? ?  ? ?  ?Medication List  ?  ? ?  ? Accurate as of October 12, 2021  2:22 PM. If you have any questions, ask your nurse or doctor.  ?  ?  ? ?  ? ?Accu-Chek Softclix Lancets lancets ?Use as instructed to check blood sugar 2 times a day ?What changed:  ?how much to take ?how to take this ?when to take this ?  ?Accu-Chek FastClix Lancets Misc ?USE TO CHECK GLUCOSE TWICE A DAY ,FASTING AND 2 HOURS AFTER DINNER ?What changed: See the new instructions. ?  ?Accu-Chek Guide test strip ?Generic drug: glucose blood ?USE TO CHECK GLUCOSE TWICE DAILY,FASTING AND 2 HOURS AFTER DINNER ?What changed: See the new instructions. ?  ?albuterol (2.5 MG/3ML) 0.083% nebulizer solution ?Commonly known as: PROVENTIL ?Take 3 mLs (2.5 mg total) by nebulization every 6 (six) hours as needed for wheezing or shortness of breath. ?  ?amLODipine 5 MG tablet ?Commonly known as: NORVASC ?1 tablet ?  ?azelastine 0.05 % ophthalmic solution ?Commonly known as: OPTIVAR ?Place 1 drop into both eyes daily. ?  ?BIOTIN PO ?Take 1 tablet by mouth daily. ?  ?blood glucose meter kit and supplies ?Per insurance preference.  Use to check glucose twice a day (fasting and 2 hours after dinner) ? Dx E11.65 ?What changed:  ?how much to take ?how to take this ?when to take this ?  ?Emgality 120 MG/ML Soaj ?Generic drug: Galcanezumab-gnlm ?Inject 120 mg into the skin every 30 (thirty) days. ?  ?Insulin Pen Needle 31G X 5 MM Misc ?1 Device by Does not apply route daily in the afternoon. ?  ?ketoconazole 2 % shampoo ?Commonly known as: NIZORAL ?Apply 1 application topically 2 (two) times a week. Apply to affected area lather, leave in place for 5 mins and then rinse off with water ?  ?ketoconazole 2 % cream ?Commonly known as: NIZORAL ?Apply 1 application topically daily. To seborrhea/face lesion as needed. ?  ?levocetirizine 5 MG tablet ?Commonly known as: XYZAL ?Take 5 mg by mouth daily. ?  ?lidocaine  5 % ?Commonly known as: Lidoderm ?Place 1 patch onto the skin daily. Remove & Discard patch within 12 hours or as directed by MD ?  ?medroxyPROGESTERone 150 MG/ML injection ?Commonly known as: DEPO-PROVERA ?Inject 150 mg into the muscle every 3 (three) months. ?  ?rizatriptan 10 MG tablet ?Commonly known as: MAXALT ?Take 1 tablet (10 mg total) by mouth 3 (three) times daily as needed for migraine. ?  ?Semglee (yfgn) 100 UNIT/ML Pen ?Generic drug: insulin glargine-yfgn ?Inject 24 Units into the skin daily. ?What changed: Another medication with the same name was removed. Continue taking this medication, and follow the directions you see here. ?Changed by: Dorita Sciara, MD ?  ?sitaGLIPtin 100 MG tablet ?Commonly known as: Januvia ?Take 0.5 tablets (50 mg total) by mouth daily. ?  ?Symbicort 160-4.5 MCG/ACT inhaler ?Generic drug: budesonide-formoterol ?INHALE 2 PUFFS INTO THE LUNGS DAILY. ?  ? ?  ? ? ? ?OBJECTIVE:  ? ?Vital Signs: BP 130/80   Pulse 96   Ht 5' 4"  (1.626 m)   Wt 149 lb 12.8 oz (67.9 kg)   SpO2 93%   BMI 25.71 kg/m?   ?Wt Readings from Last 3 Encounters:  ?10/12/21 149 lb 12.8 oz (67.9 kg)  ?09/22/21 150 lb (68 kg)   ?08/17/21 153 lb 6.4 oz (69.6 kg)  ? ? ? ?Exam: ?General: Pt appears well and is in NAD  ?Lungs: Clear with good BS bilat with no rales, rhonchi, or wheezes  ?Heart: RRR with normal S1 and S2 and no gallops; no murmurs; no r

## 2021-10-19 ENCOUNTER — Ambulatory Visit (HOSPITAL_BASED_OUTPATIENT_CLINIC_OR_DEPARTMENT_OTHER): Payer: 59 | Admitting: Orthopaedic Surgery

## 2021-10-19 ENCOUNTER — Other Ambulatory Visit: Payer: Self-pay

## 2021-10-19 DIAGNOSIS — M5459 Other low back pain: Secondary | ICD-10-CM | POA: Diagnosis not present

## 2021-10-19 DIAGNOSIS — M87052 Idiopathic aseptic necrosis of left femur: Secondary | ICD-10-CM

## 2021-10-19 DIAGNOSIS — M25559 Pain in unspecified hip: Secondary | ICD-10-CM

## 2021-10-19 MED ORDER — LIDOCAINE HCL 1 % IJ SOLN
4.0000 mL | INTRAMUSCULAR | Status: AC | PRN
  Administered 2021-10-19: 4 mL

## 2021-10-19 NOTE — Progress Notes (Signed)
? ?                            ? ? ?Chief Complaint: Right lower back and hip pain ?  ? ? ?History of Present Illness:  ? ?10/19/2021: Presents today for follow-up of her right lower back pain as well as her left hip and leg pain.  She has been able to attend 1 session of physical therapy although she feels very similar.  She continues to take Tylenol arthritis and is not using a lidocaine patch about her lower back but still continues to have pain.  She has began using a lumbar brace as this helps her somewhat.  Here today for MRI review and ongoing care in the setting of known bilateral avascular necrosis of the hips. ? ? ?Sandra Moody is a 51 y.o. female presents with right lower back and hip pain since August 2022.  This is been significantly limiting her mobility and activity.  Her pain got so back where she ultimately saw a emergency doctor the day prior.  She has not had any injections or physical therapy.  She is taking Tylenol as well as Robaxin.  She has been taking lidocaine patch hips which do help significantly.  She says that the pain awakens her at night.  She is unable to lay on the side.  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 ? ? ? ?Surgical History:   ?None ? ?PMH/PSH/Family History/Social History/Meds/Allergies:   ? ?Past Medical History:  ?Diagnosis Date  ?? Acute renal disease   ?? Asthma   ?? Bilateral carpal tunnel syndrome 03/11/2019  ?? Common migraine with intractable migraine 08/29/2018  ?? Daily headache   ? (03/08/2017)  ?? Essential hypertension 02/20/2019  ?? GERD (gastroesophageal reflux disease)   ?? H pylori ulcer ~ 08/2016  ?? History of hiatal hernia   ?? Leaky heart valve ~ 09/2016  ?? Migraine   ? "maybe weekly" (03/08/2017)  ?? PONV (postoperative nausea and vomiting)   ?? Type 2 diabetes mellitus with hyperglycemia, without long-term current use of insulin ([place]) 02/20/2019   ? ?Past Surgical History:  ?Procedure Laterality Date  ?? CARPAL TUNNEL RELEASE Left 12/18/2019  ? Procedure: CARPAL TUNNEL RELEASE;  Surgeon: [name], MD;  Location: [place];  Service: Orthopedics;  Laterality: Left;  ?? CARPAL TUNNEL RELEASE Right 07/22/2020  ? Procedure: CARPAL TUNNEL RELEASE RIGHT;  Surgeon: [name], MD;  Location: [place];  Service: Orthopedics;  Laterality: Right;  ?? CHOLECYSTECTOMY N/A 03/08/2017  ? Procedure: LAPAROSCOPIC CHOLECYSTECTOMY;  Surgeon: Coralie Keens, MD;  Location: [place];  Service: General;  Laterality: N/A;  ?? DILATION AND CURETTAGE OF UTERUS    ?? LAPAROSCOPIC CHOLECYSTECTOMY  03/08/2017  ?? SHOULDER ARTHROSCOPY WITH SUBACROMIAL DECOMPRESSION, ROTATOR CUFF REPAIR AND BICEP TENDON REPAIR Right 07/22/2020  ? Procedure: SHOULDER ARTHROSCOPY WITH SUBACROMIAL DECOMPRESSION AND DISTAL CLAVICLE EXCISION, ROTATOR CUFF REPAIR AND BICEPS TENODESIS;  Surgeon: Hiram Gash, MD;  Location: [place];  Service: Orthopedics;  Laterality: Right;  ?? TRIGGER FINGER RELEASE Left 12/18/2019  ? Procedure: RELEASE TRIGGER FINGER/A-1 PULLEY;  Surgeon: [name], MD;  Location: [place];  Service: Orthopedics;  Laterality: Left;  ?? TUBAL LIGATION    ? ?Social History  ? ?Socioeconomic History  ?? Marital status: Single  ?  Spouse name: Not on file  ?? Number of children: 3  ?? Years of education: Not on file  ?? Highest education level: 12th grade  ?Occupational History  ?  Comment: [employer]   ?Tobacco Use  ?? Smoking status: Never  ?? Smokeless tobacco: Never  ?Vaping Use  ?? Vaping Use: Never used  ?Substance and Sexual Activity  ?? Alcohol use: Not Currently  ?  Comment: 03/08/2017 "might have a drink on a holiday"  ?? Drug use: No  ?? Sexual activity: Yes  ?  Partners: Male  ?  Birth control/protection: Injection  ?Other Topics Concern  ?? Not on file  ?Social History Narrative  ? ** Merged History  Encounter **  ? Right handed   ? Lives at home with significant other 2 cups daily of caffeine   ? ?Social Determinants of Health  ? ?Financial Resource Strain: Not on file  ?Food Insecurity: Not on file  ?Transportation Needs: Not on file  ?Physical Activity: Not on file  ?Stress: Not on file  ?Social Connections: Not on file  ? ?Family History  ?Problem Relation Age of Onset  ?? Thyroid disease Mother   ?? Diabetes Maternal Grandmother   ?? Heart disease Maternal Grandfather   ? ?Allergies  ?Allergen Reactions  ?? Ginger Itching and Swelling  ?? Passion Fruit Flavor [Flavoring Agent] Itching and Swelling  ?? Shellfish Allergy Anaphylaxis  ?? Kiwi Extract Other (See Comments)  ?? Other Other (See Comments)  ?  ALLERGIC TO ALL TREE NUTS ?ALLERGIC TO ALL TREE NUTS  ?? Tamiflu [Oseltamivir Phosphate]   ? ?Current Outpatient Medications  ?Medication Sig Dispense Refill  ?? Accu-Chek FastClix Lancets MISC USE TO CHECK GLUCOSE TWICE A DAY ,FASTING AND 2 HOURS AFTER DINNER (Patient taking differently: 1 each by Other route See admin instructions. USE TO CHECK GLUCOSE TWICE A DAY ,FASTING AND 2 HOURS AFTER DINNER) 102 each 5  ?? ACCU-CHEK GUIDE test strip USE TO CHECK GLUCOSE TWICE DAILY,FASTING AND 2 HOURS AFTER DINNER (Patient taking differently: 1 each by Other route See admin instructions. USE TO CHECK GLUCOSE TWICE DAILY,FASTING AND 2 HOURS AFTER DINNER) 100 strip 5  ?? Accu-Chek Softclix Lancets lancets Use as instructed to check blood sugar 2 times a day (Patient taking differently: 1 each by Other route See admin instructions. Use as instructed to check blood sugar 2 times a day) 100 each 5  ?? albuterol (PROVENTIL) (2.5 MG/3ML) 0.083% nebulizer solution Take 3 mLs (2.5 mg total) by nebulization every 6 (six) hours as needed for wheezing or shortness of breath. 75 mL 1  ?? amLODipine (NORVASC) 5 MG tablet 1 tablet    ?? azelastine (OPTIVAR) 0.05 % ophthalmic solution Place 1 drop into both eyes daily.    ?? BIOTIN  PO Take 1 tablet by mouth daily.    ?? blood glucose meter kit and supplies Per insurance preference. Use to check glucose twice a day (fasting and 2 hours after dinner) ? Dx E11.65 (Patient taking differently: 1 each by Other route See admin instructions. Per insurance preference. Use to check glucose twice a day (fasting and 2 hours after dinner) ? Dx E11.65) 1 each 5  ?? Galcanezumab-gnlm (EMGALITY) 120 MG/ML SOAJ Inject 120 mg into the skin every 30 (thirty) days. 1 mL 11  ?? Insulin Pen Needle 31G X 5 MM MISC 1 Device by Does not apply route daily in the afternoon. 100 each 3  ?? ketoconazole (NIZORAL) 2 % cream Apply 1 application  topically daily. To seborrhea/face lesion as needed. 15 g 0  ?? ketoconazole (NIZORAL) 2 % shampoo Apply 1 application topically 2 (two) times a week. Apply to affected area lather, leave in place for 5 mins and then rinse off with water 120 mL 5  ?? levocetirizine (XYZAL) 5 MG tablet Take 5 mg by mouth daily. (Patient not taking: Reported on 10/12/2021)    ?? lidocaine (LIDODERM) 5 % Place 1 patch onto the skin daily. Remove & Discard patch within 12 hours or as directed by MD 30 patch 0  ?? medroxyPROGESTERone (DEPO-PROVERA) 150 MG/ML injection Inject 150 mg into the muscle every 3 (three) months.    ?? rizatriptan (MAXALT) 10 MG tablet Take 1 tablet (10 mg total) by mouth 3 (three) times daily as needed for migraine. 6 tablet 11  ?? SEMGLEE, YFGN, 100 UNIT/ML Pen Inject 24 Units into the skin daily. 30 mL 3  ?? sitaGLIPtin (JANUVIA) 100 MG tablet Take 0.5 tablets (50 mg total) by mouth daily. 45 tablet 3  ?? SYMBICORT 160-4.5 MCG/ACT inhaler INHALE 2 PUFFS INTO THE LUNGS DAILY. (Patient taking differently: Inhale 2 puffs into the lungs daily.) 10.2 Inhaler 0  ? ?No current facility-administered medications for this visit.  ? ?No results found. ? ?Review of Systems:   ?A ROS was performed including pertinent positives and negatives as documented in the HPI. ? ?Physical Exam :    ?Constitutional: NAD and appears stated age ?Neurological: Alert and oriented ?Psych: Appropriate affect and cooperative ?There were no vitals taken for this visit.  ? ?Comprehensive Musculoskeletal Exam:   ? ?In

## 2021-11-09 ENCOUNTER — Ambulatory Visit (HOSPITAL_BASED_OUTPATIENT_CLINIC_OR_DEPARTMENT_OTHER): Payer: 59 | Admitting: Orthopaedic Surgery

## 2021-11-17 ENCOUNTER — Ambulatory Visit: Payer: 59 | Admitting: Internal Medicine

## 2021-12-09 ENCOUNTER — Other Ambulatory Visit (HOSPITAL_BASED_OUTPATIENT_CLINIC_OR_DEPARTMENT_OTHER): Payer: Self-pay | Admitting: Orthopaedic Surgery

## 2021-12-09 ENCOUNTER — Telehealth: Payer: Self-pay | Admitting: Orthopaedic Surgery

## 2021-12-09 MED ORDER — LIDOCAINE 5 % EX PTCH: 1.0000 | MEDICATED_PATCH | CUTANEOUS | 0 refills | Status: DC

## 2021-12-09 NOTE — Telephone Encounter (Signed)
Patient called needing Rx refilled Lidocaine patches? Patient uses the pharmacy at [facility] [place] [place]!   The number to contact patient is [phone]

## 2021-12-27 ENCOUNTER — Encounter: Payer: Self-pay | Admitting: Physical Medicine and Rehabilitation

## 2021-12-27 ENCOUNTER — Ambulatory Visit: Payer: Self-pay

## 2021-12-27 ENCOUNTER — Ambulatory Visit: Payer: 59 | Admitting: Physical Medicine and Rehabilitation

## 2021-12-27 DIAGNOSIS — M461 Sacroiliitis, not elsewhere classified: Secondary | ICD-10-CM | POA: Diagnosis not present

## 2021-12-27 NOTE — Progress Notes (Unsigned)
Pt state lower back pan that travels down both legs. Pt state she takes pain meds and uses heat to help ease her pain.  Numeric Pain Rating Scale and Functional Assessment Average Pain 8   In the last MONTH (on 0-10 scale) has pain interfered with the following?  1. General activity like being  able to carry out your everyday physical activities such as walking, climbing stairs, carrying groceries, or moving a chair?  Rating(10)   +Driver, -BT, -Dye Allergies.

## 2021-12-27 NOTE — Progress Notes (Unsigned)
   [name] - 51 y.o. female MRN [id number]  Date of birth: 1971/03/07  Office Visit Note: Visit Date: 12/27/2021 [facility]CP: [facility]erred by: [facility]  Subjective: Chief Complaint  Patient presents with   Lower Back - Pain   Right Leg - Pain   Left Leg - Pain   HPI:  Sandra Moody is a 51 y.o. female who comes in todayHPI ROS Otherwise per HPI.  Assessment & Plan: Visit Diagnoses: No diagnosis found.  Plan: No additional findings.   Meds & Orders: No orders of the defined types were placed in this encounter.  No orders of the defined types were placed in this encounter.   Follow-up: No follow-ups on file.   Procedures: Sacroiliac Joint Inj on 12/27/2021 3:39 PM Indications: pain and diagnostic evaluation Details: 22 G 3.5 in needle, fluoroscopy-guided posterior approach Medications: 2 mL bupivacaine 0.5 %; 80 mg methylPREDNISolone acetate 80 MG/ML Outcome: tolerated well, no immediate complications  Sacroiliac Joint Intra-Articular Injection - Posterior Approach with Fluoroscopic Guidance   Position: PRONE  Additional Comments: Vital signs were monitored before and after the procedure. Patient was prepped and draped in the usual sterile fashion. The correct patient, procedure, and site was verified.   Injection Procedure Details:   Location/Site:  Sacroiliac joint  Needle size: 3.5 in Spinal Needle  Needle type: Spinal  Needle Placement: Intra-articular  Findings:  -Comments: There was excellent flow of contrast producing a partial arthrogram of the sacroiliac joint.   Procedure Details: Starting with a 90 degree vertical and midline orientation the fluoroscope was tilted cranially 20 to 25 degrees and the target area of the inferior most part of the SI joint on the side mentioned above was visualized.  The soft tissues overlying this target were infiltrated with 4 ml. of 1% Lidocaine without Epinephrine. A #22 gauge  spinal needle was inserted perpendicular to the fluoroscope table and advanced into the posterior inferior joint space using fluoroscopic guidance.  Position in the joint space was confirmed by obtaining a partial arthrogram using a 2 ml. volume of Isovue-250 contrast agent. After negative aspirate for gross pus or blood, the injectate was delivered to the joint. Radiographs were obtained for documentation purposes.   Additional Comments:   Dressing: Bandaid    Post-procedure details: Patient was observed during the procedure. Post-procedure instructions were reviewed.  Patient left the clinic in stable condition.    There was excellent flow of contrast producing a partial arthrogram of the sacroiliac joint.  Procedure, treatment alternatives, risks and benefits explained, specific risks discussed. Consent was given by the patient. Immediately prior to procedure a time out was called to verify the correct patient, procedure, equipment, support staff and site/side marked as required. Patient was prepped and draped in the usual sterile fashion.         Clinical History: No specialty comments available.     Objective:  VS:  HT:    WT:   BMI:     BP:   HR: bpm  TEMP: ( )  RESP:  Physical Exam   Imaging: No results found.

## 2021-12-28 ENCOUNTER — Ambulatory Visit (HOSPITAL_BASED_OUTPATIENT_CLINIC_OR_DEPARTMENT_OTHER): Payer: 59 | Admitting: Orthopaedic Surgery

## 2021-12-28 ENCOUNTER — Other Ambulatory Visit (HOSPITAL_BASED_OUTPATIENT_CLINIC_OR_DEPARTMENT_OTHER): Payer: Self-pay

## 2021-12-28 DIAGNOSIS — M461 Sacroiliitis, not elsewhere classified: Secondary | ICD-10-CM

## 2021-12-28 DIAGNOSIS — M4688 Other specified inflammatory spondylopathies, sacral and sacrococcygeal region: Secondary | ICD-10-CM

## 2021-12-28 MED ORDER — TRAMADOL HCL 50 MG PO TABS
50.0000 mg | ORAL_TABLET | Freq: Four times a day (QID) | ORAL | 3 refills | Status: DC | PRN
  Filled 2021-12-28: qty 30, 8d supply, fill #0

## 2021-12-28 MED ORDER — METHYLPREDNISOLONE ACETATE 80 MG/ML IJ SUSP
80.0000 mg | INTRAMUSCULAR | Status: AC | PRN
  Administered 2021-12-27: 80 mg via INTRA_ARTICULAR

## 2021-12-28 MED ORDER — BUPIVACAINE HCL 0.5 % IJ SOLN
2.0000 mL | INTRAMUSCULAR | Status: AC | PRN
  Administered 2021-12-27: 2 mL via INTRA_ARTICULAR

## 2021-12-28 MED ORDER — CELECOXIB 100 MG PO CAPS
100.0000 mg | ORAL_CAPSULE | Freq: Two times a day (BID) | ORAL | 3 refills | Status: DC
  Filled 2021-12-28: qty 30, 15d supply, fill #0
  Filled 2022-01-30: qty 30, 15d supply, fill #1

## 2021-12-28 MED ORDER — CELECOXIB 100 MG PO CAPS
100.0000 mg | ORAL_CAPSULE | Freq: Two times a day (BID) | ORAL | 3 refills | Status: DC
  Filled 2021-12-28: qty 30, 15d supply, fill #0

## 2021-12-28 NOTE — Progress Notes (Signed)
Chief Complaint: Right lower back and hip pain     History of Present Illness:   12/28/2021: Presents today for follow-up of her right lower back pain as well as her left hip and leg pain.  She is status post injection with Dr. Ernestina Patches the day prior.  She states that she is not noticing significant difference at this time.  She is here today for further assessment.    Surgical History:   None  PMH/PSH/Family History/Social History/Meds/Allergies:    Past Medical History:  Diagnosis Date   Acute renal disease    Asthma    Bilateral carpal tunnel syndrome 03/11/2019   Common migraine with intractable migraine 08/29/2018   Daily headache    (03/08/2017)   Essential hypertension 02/20/2019   GERD (gastroesophageal reflux disease)    H pylori ulcer ~ 08/2016   History of hiatal hernia    Leaky heart valve ~ 09/2016   Migraine    "maybe weekly" (03/08/2017)   PONV (postoperative nausea and vomiting)    Type 2 diabetes mellitus with hyperglycemia, without long-term current use of insulin ([place]) 02/20/2019   Past Surgical History:  Procedure Laterality Date   CARPAL TUNNEL RELEASE Left 12/18/2019   Procedure: CARPAL TUNNEL RELEASE;  Surgeon: [name], MD;  Location: [facility];  Service: Orthopedics;  Laterality: Left;   CARPAL TUNNEL RELEASE Right 07/22/2020   Procedure: CARPAL TUNNEL RELEASE RIGHT;  Surgeon: [name], MD;  Location: [facility];  Service: Orthopedics;  Laterality: Right;   CHOLECYSTECTOMY N/A 03/08/2017   Procedure: LAPAROSCOPIC CHOLECYSTECTOMY;  Surgeon: Coralie Keens, MD;  Location: [facility];  Service: General;  Laterality: N/A;   DILATION AND CURETTAGE OF UTERUS     LAPAROSCOPIC CHOLECYSTECTOMY  03/08/2017   SHOULDER ARTHROSCOPY WITH SUBACROMIAL DECOMPRESSION, ROTATOR CUFF REPAIR AND BICEP TENDON REPAIR Right 07/22/2020   Procedure: SHOULDER ARTHROSCOPY WITH SUBACROMIAL DECOMPRESSION AND DISTAL  CLAVICLE EXCISION, ROTATOR CUFF REPAIR AND BICEPS TENODESIS;  Surgeon: Hiram Gash, MD;  Location: [facility];  Service: Orthopedics;  Laterality: Right;   TRIGGER FINGER RELEASE Left 12/18/2019   Procedure: RELEASE TRIGGER FINGER/A-1 PULLEY;  Surgeon: [name], MD;  Location: [facility];  Service: Orthopedics;  Laterality: Left;   TUBAL LIGATION     Social History   Socioeconomic History   Marital status: Single    Spouse name: Not on file   Number of children: 3   Years of education: Not on file   Highest education level: 12th grade  Occupational History    Comment: [employer]   Tobacco Use   Smoking status: Never   Smokeless tobacco: Never  Vaping Use   Vaping Use: Never used  Substance and Sexual Activity   Alcohol use: Not Currently    Comment: 03/08/2017 "might have a drink on a holiday"   Drug use: No   Sexual activity: Yes    Partners: Male    Birth control/protection: Injection  Other Topics Concern   Not on file  Social History Narrative   ** Merged History Encounter **   Right handed    Lives at home with significant other 2 cups daily of caffeine    Social Determinants of Health   Financial Resource Strain: Not on file  Food Insecurity: Not on file  Transportation Needs: Not on file  Physical Activity: Not on file  Stress: Not on file  Social Connections: Not on file   Family History  Problem Relation Age of Onset   Thyroid disease Mother    Diabetes Maternal Grandmother    Heart disease Maternal Grandfather    Allergies  Allergen Reactions   Ginger Itching and Swelling   Passion Fruit Flavor [Flavoring Agent] Itching and Swelling   Shellfish Allergy Anaphylaxis   Kiwi Extract Other (See Comments)   Other Other (See Comments)    ALLERGIC TO ALL TREE NUTS ALLERGIC TO ALL TREE NUTS   Tamiflu [Oseltamivir Phosphate]    Current Outpatient Medications  Medication Sig Dispense Refill   traMADol (ULTRAM) 50 MG  tablet Take 1 tablet (50 mg total) by mouth every 6 (six) hours as needed. 30 tablet 3   Accu-Chek FastClix Lancets MISC USE TO CHECK GLUCOSE TWICE A DAY ,FASTING AND 2 HOURS AFTER DINNER (Patient taking differently: 1 each by Other route See admin instructions. USE TO CHECK GLUCOSE TWICE A DAY ,FASTING AND 2 HOURS AFTER DINNER) 102 each 5   ACCU-CHEK GUIDE test strip USE TO CHECK GLUCOSE TWICE DAILY,FASTING AND 2 HOURS AFTER DINNER (Patient taking differently: 1 each by Other route See admin instructions. USE TO CHECK GLUCOSE TWICE DAILY,FASTING AND 2 HOURS AFTER DINNER) 100 strip 5   Accu-Chek Softclix Lancets lancets Use as instructed to check blood sugar 2 times a day (Patient taking differently: 1 each by Other route See admin instructions. Use as instructed to check blood sugar 2 times a day) 100 each 5   albuterol (PROVENTIL) (2.5 MG/3ML) 0.083% nebulizer solution Take 3 mLs (2.5 mg total) by nebulization every 6 (six) hours as needed for wheezing or shortness of breath. 75 mL 1   amLODipine (NORVASC) 5 MG tablet 1 tablet     azelastine (OPTIVAR) 0.05 % ophthalmic solution Place 1 drop into both eyes daily.     BIOTIN PO Take 1 tablet by mouth daily.     blood glucose meter kit and supplies Per insurance preference. Use to check glucose twice a day (fasting and 2 hours after dinner)  Dx E11.65 (Patient taking differently: 1 each by Other route See admin instructions. Per insurance preference. Use to check glucose twice a day (fasting and 2 hours after dinner)  Dx E11.65) 1 each 5   Galcanezumab-gnlm (EMGALITY) 120 MG/ML SOAJ Inject 120 mg into the skin every 30 (thirty) days. 1 mL 11   Insulin Pen Needle 31G X 5 MM MISC 1 Device by Does not apply route daily in the afternoon. 100 each 3   ketoconazole (NIZORAL) 2 % cream Apply 1 application topically daily. To seborrhea/face lesion as needed. 15 g 0   ketoconazole (NIZORAL) 2 % shampoo Apply 1 application topically 2 (two) times a week. Apply to  affected area lather, leave in place for 5 mins and then rinse off with water 120 mL 5   levocetirizine (XYZAL) 5 MG tablet Take 5 mg by mouth daily.     lidocaine (LIDODERM) 5 % Place 1 patch onto the skin daily. Remove & Discard patch within 12 hours or as directed by MD 30 patch 0   medroxyPROGESTERone (DEPO-PROVERA) 150 MG/ML injection Inject 150 mg into the muscle every 3 (three) months.     rizatriptan (MAXALT) 10 MG tablet Take 1 tablet (10 mg total) by mouth 3 (three) times daily as needed for migraine. 6 tablet 11   SEMGLEE, YFGN,  100 UNIT/ML Pen Inject 24 Units into the skin daily. 30 mL 3   sitaGLIPtin (JANUVIA) 100 MG tablet Take 0.5 tablets (50 mg total) by mouth daily. 45 tablet 3   SYMBICORT 160-4.5 MCG/ACT inhaler INHALE 2 PUFFS INTO THE LUNGS DAILY. (Patient taking differently: Inhale 2 puffs into the lungs daily.) 10.2 Inhaler 0   No current facility-administered medications for this visit.   XR C-ARM NO REPORT  Result Date: 12/27/2021 Please see Notes tab for imaging impression.   Review of Systems:   A ROS was performed including pertinent positives and negatives as documented in the HPI.  Physical Exam :   Constitutional: NAD and appears stated age Neurological: Alert and oriented Psych: Appropriate affect and cooperative There were no vitals taken for this visit.   Comprehensive Musculoskeletal Exam:    Inspection Right Left  Skin No atrophy or gross abnormalities appreciated No atrophy or gross abnormalities appreciated  Palpation    Tenderness Right SI joint Groin and anterior thigh pain  Crepitus None None  Range of Motion    Flexion (passive) 120 120  Extension 30 30  IR 30 30 with pain  ER 45 with pain 45 with pain  Strength    Flexion  5/5 5/5  Extension 5/5 5/5  Special Tests    FABIR Positive Positive  FADER Negative Negative  ER Lag/Capsular Insufficiency Negative Negative  Instability Negative Negative  Sacroiliac pain Negative  Negative    Instability    Generalized Laxity No No  Neurologic    sciatic, femoral, obturator nerves intact to light sensation  Vascular/Lymphatic    DP pulse 2+ 2+  Lumbar Exam    Patient has symmetric lumbar range of motion with negative pain referral to hip     Imaging:   Xray (3 views right hip): Normal  MRI lumbar spine, MRI pelvis: She does have a relatively normal-appearing lumbar spine.  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   Assessment:   51 year old female with right lower back pain as well as hip pain.  With regard to the back pain I do specifically believe that this is involving her right SI joint.  As result I would like to refer her to Dr. Ernestina Patches for an SI guided injection.  In regards to her left hip I do believe that this pain is somewhat more equivocal.  At this time we will plan to monitor her progress with her SI injection.  At this time I will also like to perform a referral to Dr. Ranell Patrick for assessment of additional nonoperative pain management options for her lower back pain.  At this time I will plan to start her on Celebrex to assist with this Plan :    -Plan for Celebrex order -Referral to Dr. Ranell Patrick for discussion of nonoperative treatment modalities      I personally saw and evaluated the patient, and participated in the management and treatment plan.  Vanetta Mulders, MD Attending Physician, Orthopedic Surgery  This document was dictated using Dragon voice recognition software. A reasonable attempt at proof reading has been made to minimize errors.

## 2022-01-13 ENCOUNTER — Other Ambulatory Visit (HOSPITAL_BASED_OUTPATIENT_CLINIC_OR_DEPARTMENT_OTHER): Payer: Self-pay | Admitting: Orthopaedic Surgery

## 2022-01-16 MED ORDER — LIDOCAINE 5 % EX PTCH: 1.0000 | MEDICATED_PATCH | CUTANEOUS | 0 refills | Status: DC

## 2022-01-17 NOTE — Progress Notes (Unsigned)
PATIENT: Sandra Moody DOB: 1971/06/29  REASON FOR VISIT: follow up for migraine HISTORY FROM: patient PRIMARY NEUROLOGIST: Dr. Billey Gosling  HISTORY OF PRESENT ILLNESS: Today 01/18/22 Sandra Moody is here today for follow-up. Was doing well on Emgality, having 3 migraines a month. Switched insurances, didn't refill Emgality, has been out since end of last year. Right now having 2-4 migraines a week. Takes Maxalt, black coffee for acute headaches. Has DM, is off insulin. Last A1C was 6.30 September 2021. Seeing orthopedics for low back pain, hip pain, referred to Dr. Ranell Patrick for discussion of non-operative treatment options. Since Emgality was so helpful, is agreeable to continue injections.   Update 01/18/21 SS: Sandra Moody is a 51 year old female here today to follow-up on her migraine headaches. Had COVID few weeks ago, got hurt at work in Dec, had rotator cuff surgery on the right. Isn't going back to work. Isn't improving as quickly as expected, still in PT.  Remains on Emgality, on average 3 migraines a month.  Good benefit with Maxalt. Usually has tablets left each month.  Happy with current migraine control.  Update 05/20/2020 SS: Sandra Moody is a 51 year old female history of migraine headache.  Has previously been on Tofranil, but stopped after she felt it was interfering with her diabetes.  Previously tried Topamax.  In March was placed on Emgality.  Has been on now for about 3 to 4 months, has really helped her headaches, more than 80%.  In the last several months, has not had any severe migraine.  Has had a few mild headaches, has taken Maxalt twice.  She has been dealing with some orthopedic issues, had sciatica, frozen shoulder on the right, ear infection, is also on insulin for diabetes.  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  Seems to be tolerating the Emgality injections well, does have some burning initially.  Is on Lyrica for foot pain.  Overall, headaches are  much improved.  Presents today for evaluation unaccompanied.  Planning to have right carpal tunnel surgery with Dr. Fredna Dow.  HISTORY 10/21/2019 SS: Sandra Moody is a 51 year old female with history of migraine headache.  Since last seen, she stopped taking Tofranil, was afraid it could be affecting her diabetes, was tired of taking multiple medications.  Since off the medicine, has had at least 2 migraines a week, with nausea.  Will take Maxalt with good benefit.  Her diabetes is now under control.  She is seeing a foot doctor, is prescribed Lyrica.  She says her headaches worsen during allergy season.  She has asthma.  She says she has previously been on Topamax.  She has not missing work, but says she has to go to work, she works as a Advertising account executive.  She has seen Dr. Fredna Dow, for thumb issues, says is doing well.  She presents today for evaluation unaccompanied.    REVIEW OF SYSTEMS: Out of a complete 14 system review of symptoms, the patient complains only of the following symptoms, and all other reviewed systems are negative.  See HPI  ALLERGIES: Allergies  Allergen Reactions   Ginger Itching and Swelling   Passion Fruit Flavor [Flavoring Agent] Itching and Swelling   Shellfish Allergy Anaphylaxis   Kiwi Extract Other (See Comments)   Other Other (See Comments)    ALLERGIC TO ALL TREE NUTS ALLERGIC TO ALL TREE NUTS   Tamiflu [Oseltamivir Phosphate]     HOME MEDICATIONS: Outpatient Medications Prior to Visit  Medication Sig Dispense  Refill   Accu-Chek FastClix Lancets MISC USE TO CHECK GLUCOSE TWICE A DAY ,FASTING AND 2 HOURS AFTER DINNER 102 each 5   ACCU-CHEK GUIDE test strip USE TO CHECK GLUCOSE TWICE DAILY,FASTING AND 2 HOURS AFTER DINNER 100 strip 5   Accu-Chek Softclix Lancets lancets Use as instructed to check blood sugar 2 times a day 100 each 5   albuterol (PROVENTIL) (2.5 MG/3ML) 0.083% nebulizer solution Take 3 mLs (2.5 mg total) by nebulization every 6 (six) hours as needed  for wheezing or shortness of breath. 75 mL 1   amLODipine (NORVASC) 5 MG tablet 1 tablet     azelastine (OPTIVAR) 0.05 % ophthalmic solution Place 1 drop into both eyes daily.     BIOTIN PO Take 1 tablet by mouth daily.     blood glucose meter kit and supplies Per insurance preference. Use to check glucose twice a day (fasting and 2 hours after dinner)  Dx E11.65 (Patient taking differently: 1 each by Other route See admin instructions. Per insurance preference. Use to check glucose twice a day (fasting and 2 hours after dinner)  Dx E11.65) 1 each 5   celecoxib (CELEBREX) 100 MG capsule Take 1 capsule (100 mg total) by mouth 2 (two) times daily. 30 capsule 3   Insulin Pen Needle 31G X 5 MM MISC 1 Device by Does not apply route daily in the afternoon. 100 each 3   ketoconazole (NIZORAL) 2 % cream Apply 1 application topically daily. To seborrhea/face lesion as needed. 15 g 0   ketoconazole (NIZORAL) 2 % shampoo Apply 1 application topically 2 (two) times a week. Apply to affected area lather, leave in place for 5 mins and then rinse off with water 120 mL 5   lidocaine (LIDODERM) 5 % Place 1 patch onto the skin daily. Remove & Discard patch within 12 hours or as directed by MD 30 patch 0   rizatriptan (MAXALT) 10 MG tablet Take 1 tablet (10 mg total) by mouth 3 (three) times daily as needed for migraine. 6 tablet 11   Galcanezumab-gnlm (EMGALITY) 120 MG/ML SOAJ Inject 120 mg into the skin every 30 (thirty) days. 1 mL 11   levocetirizine (XYZAL) 5 MG tablet Take 5 mg by mouth daily.     medroxyPROGESTERone (DEPO-PROVERA) 150 MG/ML injection Inject 150 mg into the muscle every 3 (three) months.     SEMGLEE, YFGN, 100 UNIT/ML Pen Inject 24 Units into the skin daily. 30 mL 3   sitaGLIPtin (JANUVIA) 100 MG tablet Take 0.5 tablets (50 mg total) by mouth daily. 45 tablet 3   SYMBICORT 160-4.5 MCG/ACT inhaler INHALE 2 PUFFS INTO THE LUNGS DAILY. (Patient taking differently: Inhale 2 puffs into the lungs  daily.) 10.2 Inhaler 0   No facility-administered medications prior to visit.    PAST MEDICAL HISTORY: Past Medical History:  Diagnosis Date   Acute renal disease    Asthma    Bilateral carpal tunnel syndrome 03/11/2019   Common migraine with intractable migraine 08/29/2018   Daily headache    (03/08/2017)   Essential hypertension 02/20/2019   GERD (gastroesophageal reflux disease)    H pylori ulcer ~ 08/2016   History of hiatal hernia    Leaky heart valve ~ 09/2016   Migraine    "maybe weekly" (03/08/2017)   PONV (postoperative nausea and vomiting)    Type 2 diabetes mellitus with hyperglycemia, without long-term current use of insulin ([place]) 02/20/2019    PAST SURGICAL HISTORY: Past Surgical History:  Procedure Laterality  Date   CARPAL TUNNEL RELEASE Left 12/18/2019   Procedure: CARPAL TUNNEL RELEASE;  Surgeon: [name], MD;  Location: [facility];  Service: Orthopedics;  Laterality: Left;   CARPAL TUNNEL RELEASE Right 07/22/2020   Procedure: CARPAL TUNNEL RELEASE RIGHT;  Surgeon: [name], MD;  Location: [facility];  Service: Orthopedics;  Laterality: Right;   CHOLECYSTECTOMY N/A 03/08/2017   Procedure: LAPAROSCOPIC CHOLECYSTECTOMY;  Surgeon: Coralie Keens, MD;  Location: [facility];  Service: General;  Laterality: N/A;   DILATION AND CURETTAGE OF UTERUS     LAPAROSCOPIC CHOLECYSTECTOMY  03/08/2017   SHOULDER ARTHROSCOPY WITH SUBACROMIAL DECOMPRESSION, ROTATOR CUFF REPAIR AND BICEP TENDON REPAIR Right 07/22/2020   Procedure: SHOULDER ARTHROSCOPY WITH SUBACROMIAL DECOMPRESSION AND DISTAL CLAVICLE EXCISION, ROTATOR CUFF REPAIR AND BICEPS TENODESIS;  Surgeon: Hiram Gash, MD;  Location: [facility];  Service: Orthopedics;  Laterality: Right;   TRIGGER FINGER RELEASE Left 12/18/2019   Procedure: RELEASE TRIGGER FINGER/A-1 PULLEY;  Surgeon: [name], MD;  Location: [facility];  Service: Orthopedics;  Laterality: Left;    TUBAL LIGATION      FAMILY HISTORY: Family History  Problem Relation Age of Onset   Thyroid disease Mother    Diabetes Maternal Grandmother    Heart disease Maternal Grandfather     SOCIAL HISTORY: Social History   Socioeconomic History   Marital status: Single    Spouse name: Not on file   Number of children: 3   Years of education: Not on file   Highest education level: 12th grade  Occupational History    Comment: [employer]   Tobacco Use   Smoking status: Never   Smokeless tobacco: Never  Vaping Use   Vaping Use: Never used  Substance and Sexual Activity   Alcohol use: Not Currently    Comment: 03/08/2017 "might have a drink on a holiday"   Drug use: No   Sexual activity: Yes    Partners: Male    Birth control/protection: Injection  Other Topics Concern   Not on file  Social History Narrative   ** Merged History Encounter **   Right handed    Lives at home with significant other 2 cups daily of caffeine    Social Determinants of Health   Financial Resource Strain: Not on file  Food Insecurity: Not on file  Transportation Needs: Not on file  Physical Activity: Not on file  Stress: Not on file  Social Connections: Not on file  Intimate Partner Violence: Not on file   PHYSICAL EXAM  Vitals:   01/18/22 0744  BP: 113/76  Pulse: 86  Weight: 146 lb 8 oz (66.5 kg)  Height: 5' 4"  (1.626 m)   Body mass index is 25.15 kg/m.  Generalized: Well developed, in no acute distress   Neurological examination  Mentation: Alert oriented to time, place, history taking. Follows all commands speech and language fluent Cranial nerve II-XII: Pupils were equal round reactive to light. Extraocular movements were full, visual field were full on confrontational test. Facial sensation and strength were normal.  Head turning and shoulder shrug  were normal and symmetric. Motor: The motor testing reveals 5 over 5 strength of all 4 extremities. Good symmetric motor tone is  noted throughout.  Sensory: Sensory testing is intact to soft touch on all 4 extremities. No evidence of extinction is noted.  Coordination: Cerebellar testing reveals good finger-nose-finger and heel-to-shin bilaterally.  Gait and station: Gait is normal.  Reflexes: Deep tendon reflexes  are symmetric and normal bilaterally.   DIAGNOSTIC DATA (LABS, IMAGING, TESTING) - I reviewed patient records, labs, notes, testing and imaging myself where available.  Lab Results  Component Value Date   WBC 9.1 08/10/2021   HGB 13.1 08/10/2021   HCT 39.8 08/10/2021   MCV 93.2 08/10/2021   PLT 341 08/10/2021      Component Value Date/Time   NA 139 08/10/2021 0352   NA 140 08/31/2020 1207   K 3.5 08/10/2021 0352   CL 104 08/10/2021 0352   CO2 26 08/10/2021 0352   GLUCOSE 101 (H) 08/10/2021 0352   BUN 8 08/10/2021 0352   BUN 6 08/31/2020 1207   CREATININE 0.98 08/10/2021 0352   CALCIUM 9.3 08/10/2021 0352   PROT 6.4 (L) 06/15/2021 2150   PROT 6.6 03/05/2020 1108   ALBUMIN 3.4 (L) 06/15/2021 2150   ALBUMIN 4.3 03/05/2020 1108   AST 17 06/15/2021 2150   ALT 15 06/15/2021 2150   ALKPHOS 70 06/15/2021 2150   BILITOT 0.3 06/15/2021 2150   BILITOT 0.2 03/05/2020 1108   GFRNONAA >60 08/10/2021 0352   GFRAA 76 08/31/2020 1207   Lab Results  Component Value Date   CHOL 124 08/07/2019   HDL 35 (L) 08/07/2019   LDLCALC 72 08/07/2019   TRIG 86 08/07/2019   CHOLHDL 3.5 08/07/2019   Lab Results  Component Value Date   HGBA1C 6.3 (A) 10/12/2021   Lab Results  Component Value Date   LTRVUYEB34 356 05/23/2019   Lab Results  Component Value Date   TSH 1.590 06/01/2020   ASSESSMENT AND PLAN 51 y.o. year old female   1.  Chronic migraine headache 2.  Diabetes  -Restart Emgality 120 mg monthly injection for migraine prevention -Continue Maxalt as needed for acute headache -In the past has tried and failed Tofranil, Topamax, baclofen -Follow-up 1 year or sooner if needed  Evangeline Dakin, DNP 01/18/2022, 7:50 AM [facility] [address], [place], [place] [address] [phone]

## 2022-01-18 ENCOUNTER — Ambulatory Visit (INDEPENDENT_AMBULATORY_CARE_PROVIDER_SITE_OTHER): Payer: 59 | Admitting: Neurology

## 2022-01-18 ENCOUNTER — Telehealth: Payer: Self-pay | Admitting: *Deleted

## 2022-01-18 ENCOUNTER — Encounter: Payer: Self-pay | Admitting: Neurology

## 2022-01-18 VITALS — BP 113/76 | HR 86 | Ht 64.0 in | Wt 146.5 lb

## 2022-01-18 DIAGNOSIS — G43009 Migraine without aura, not intractable, without status migrainosus: Secondary | ICD-10-CM | POA: Diagnosis not present

## 2022-01-18 MED ORDER — RIZATRIPTAN BENZOATE 10 MG PO TABS
10.0000 mg | ORAL_TABLET | Freq: Three times a day (TID) | ORAL | 11 refills | Status: DC | PRN
Start: 2022-01-18 — End: 2023-01-24

## 2022-01-18 MED ORDER — EMGALITY 120 MG/ML ~~LOC~~ SOAJ: 120.0000 mg | SUBCUTANEOUS | 11 refills | Status: DC

## 2022-01-18 NOTE — Telephone Encounter (Signed)
PA for Emgality 120mg  started on covermymeds (key: ). Pharmacy coverage through [id number] (w/ [employer]). Saturday. Decision pending.

## 2022-01-18 NOTE — Patient Instructions (Signed)
Meds ordered this encounter  Medications   Galcanezumab-gnlm (EMGALITY) 120 MG/ML SOAJ    Sig: Inject 120 mg into the skin every 30 (thirty) days.    Dispense:  1.12 mL    Refill:  11   rizatriptan (MAXALT) 10 MG tablet    Sig: Take 1 tablet (10 mg total) by mouth 3 (three) times daily as needed for migraine.    Dispense:  10 tablet    Refill:  11

## 2022-01-18 NOTE — Telephone Encounter (Signed)
Request ID: [id number] approved through 07/20/2022.

## 2022-01-27 ENCOUNTER — Ambulatory Visit: Payer: 59 | Admitting: Internal Medicine

## 2022-01-30 ENCOUNTER — Other Ambulatory Visit (HOSPITAL_BASED_OUTPATIENT_CLINIC_OR_DEPARTMENT_OTHER): Payer: Self-pay

## 2022-01-30 ENCOUNTER — Encounter: Payer: Self-pay | Admitting: Physical Medicine and Rehabilitation

## 2022-02-02 ENCOUNTER — Ambulatory Visit: Payer: BLUE CROSS/BLUE SHIELD | Admitting: Internal Medicine

## 2022-02-02 ENCOUNTER — Encounter: Payer: Self-pay | Admitting: Internal Medicine

## 2022-02-02 VITALS — BP 126/74 | HR 97 | Ht 64.0 in | Wt 148.0 lb

## 2022-02-02 DIAGNOSIS — E119 Type 2 diabetes mellitus without complications: Secondary | ICD-10-CM

## 2022-02-02 LAB — POCT GLYCOSYLATED HEMOGLOBIN (HGB A1C): Hemoglobin A1C: 6.1 % — AB (ref 4.0–5.6)

## 2022-02-02 NOTE — Patient Instructions (Signed)
Keep Up the Good Work ! A1c 6.1 % without any medications

## 2022-02-02 NOTE — Progress Notes (Signed)
Name: Sandra Moody  Age/ Sex: 51 y.o., female   MRN/ DOB: [id number], 1971/03/22     PCP: [facility]   Reason for Endocrinology Evaluation: Type 2 Diabetes Mellitus  Initial Endocrine Consultative Visit: 09/05/2019    PATIENT IDENTIFIER: Ms. Sandra Moody is a 51 y.o. female with a past medical history of asthma. The patient has followed with Endocrinology clinic since 09/05/2019 for consultative assistance with management of her diabetes.  DIABETIC HISTORY:  Sandra Moody was diagnosed with DM in 09/2018.  She has been on multiple oral glycemic agents with reported intolerance.   Glipizide caused stomach issues as well as Metformin.  Actos caused hypoglycemia and was discontinued in 07/2019 , she has not been on insulin before. Her hemoglobin A1c has ranged from 6.1% in 08/2019, peaking at 10.3%  in 2020  On her initial visit to our clinic she was not on any oral glycemic agents, and declined any oral medications at the time.  By 12/2019 she presented to her PCP with hypoglycemia and BG is in the 300s patient was started on Januvia at the time.   Started Jardiance 07/2020 but she does not want it   By 09/2021  stopped insulin with an A1c 6.3%   SUBJECTIVE:   During the last visit (10/12/2021): A1c 6.3 %, Januvia reduced per her request    Today (02/02/2022): Sandra Moody is here for follow-up on diabetes management.   She checks her blood sugars 1 times daily.  Denies nausea or diarrhea  She has self discontinued Januvia last time she saw me in March , we had already discussed stopping insulin     HOME DIABETES REGIMEN:  Januvia 100 mg ,1 tablet  daily - not taking      METER DOWNLOAD SUMMARY: unable to download 92- 155      DIABETIC COMPLICATIONS: Microvascular complications:   Denies: neuropathy , retinopathy,  Last eye exam: Completed 09/2019   Macrovascular complications:    Denies: CAD, PVD, CVA  HISTORY:  Past Medical History:  Past Medical  History:  Diagnosis Date   Acute renal disease    Asthma    Bilateral carpal tunnel syndrome 03/11/2019   Common migraine with intractable migraine 08/29/2018   Daily headache    (03/08/2017)   Essential hypertension 02/20/2019   GERD (gastroesophageal reflux disease)    H pylori ulcer ~ 08/2016   History of hiatal hernia    Leaky heart valve ~ 09/2016   Migraine    "maybe weekly" (03/08/2017)   PONV (postoperative nausea and vomiting)    Type 2 diabetes mellitus with hyperglycemia, without long-term current use of insulin ([place]) 02/20/2019   Past Surgical History:  Past Surgical History:  Procedure Laterality Date   CARPAL TUNNEL RELEASE Left 12/18/2019   Procedure: CARPAL TUNNEL RELEASE;  Surgeon: [name], MD;  Location: [place];  Service: Orthopedics;  Laterality: Left;   CARPAL TUNNEL RELEASE Right 07/22/2020   Procedure: CARPAL TUNNEL RELEASE RIGHT;  Surgeon: [name], MD;  Location: [place];  Service: Orthopedics;  Laterality: Right;   CHOLECYSTECTOMY N/A 03/08/2017   Procedure: LAPAROSCOPIC CHOLECYSTECTOMY;  Surgeon: Coralie Keens, MD;  Location: [place];  Service: General;  Laterality: N/A;   DILATION AND CURETTAGE OF UTERUS     LAPAROSCOPIC CHOLECYSTECTOMY  03/08/2017   SHOULDER ARTHROSCOPY WITH SUBACROMIAL DECOMPRESSION, ROTATOR CUFF REPAIR AND BICEP TENDON REPAIR Right 07/22/2020   Procedure: SHOULDER ARTHROSCOPY WITH SUBACROMIAL DECOMPRESSION AND DISTAL CLAVICLE  EXCISION, ROTATOR CUFF REPAIR AND BICEPS TENODESIS;  Surgeon: Hiram Gash, MD;  Location: [facility];  Service: Orthopedics;  Laterality: Right;   TRIGGER FINGER RELEASE Left 12/18/2019   Procedure: RELEASE TRIGGER FINGER/A-1 PULLEY;  Surgeon: [name], MD;  Location: [place];  Service: Orthopedics;  Laterality: Left;   TUBAL LIGATION     Social History:  reports that she has never smoked. She has never used smokeless tobacco. She reports  that she does not currently use alcohol. She reports that she does not use drugs. Family History:  Family History  Problem Relation Age of Onset   Thyroid disease Mother    Diabetes Maternal Grandmother    Heart disease Maternal Grandfather      HOME MEDICATIONS: Allergies as of 02/02/2022       Reactions   Ginger Itching, Swelling   Passion Fruit Flavor [flavoring Agent] Itching, Swelling   Shellfish Allergy Anaphylaxis   Kiwi Extract Other (See Comments)   Other Other (See Comments)   ALLERGIC TO ALL TREE NUTS ALLERGIC TO ALL TREE NUTS   Tamiflu [oseltamivir Phosphate]         Medication List        Accurate as of February 02, 2022  1:47 PM. If you have any questions, ask your nurse or doctor.          Accu-Chek Softclix Lancets lancets Use as instructed to check blood sugar 2 times a day   Accu-Chek FastClix Lancets Misc USE TO CHECK GLUCOSE TWICE A DAY ,FASTING AND 2 HOURS AFTER DINNER   Accu-Chek Guide test strip Generic drug: glucose blood USE TO CHECK GLUCOSE TWICE DAILY,FASTING AND 2 HOURS AFTER DINNER   albuterol (2.5 MG/3ML) 0.083% nebulizer solution Commonly known as: PROVENTIL Take 3 mLs (2.5 mg total) by nebulization every 6 (six) hours as needed for wheezing or shortness of breath.   amLODipine 5 MG tablet Commonly known as: NORVASC 1 tablet   azelastine 0.05 % ophthalmic solution Commonly known as: OPTIVAR Place 1 drop into both eyes daily.   BIOTIN PO Take 1 tablet by mouth daily.   blood glucose meter kit and supplies Per insurance preference. Use to check glucose twice a day (fasting and 2 hours after dinner)  Dx E11.65 What changed:  how much to take how to take this when to take this   celecoxib 100 MG capsule Commonly known as: CeleBREX Take 1 capsule (100 mg total) by mouth 2 (two) times daily.   Emgality 120 MG/ML Soaj Generic drug: Galcanezumab-gnlm Inject 120 mg into the skin every 30 (thirty) days.   Insulin Pen Needle  31G X 5 MM Misc 1 Device by Does not apply route daily in the afternoon.   ketoconazole 2 % shampoo Commonly known as: NIZORAL Apply 1 application topically 2 (two) times a week. Apply to affected area lather, leave in place for 5 mins and then rinse off with water   ketoconazole 2 % cream Commonly known as: NIZORAL Apply 1 application topically daily. To seborrhea/face lesion as needed.   lidocaine 5 % Commonly known as: Lidoderm Place 1 patch onto the skin daily. Remove & Discard patch within 12 hours or as directed by MD   rizatriptan 10 MG tablet Commonly known as: MAXALT Take 1 tablet (10 mg total) by mouth 3 (three) times daily as needed for migraine.         OBJECTIVE:   Vital Signs: BP 126/74 (BP Location: Left Arm, Patient  Position: Sitting, Cuff Size: Small)   Pulse 97   Ht _0  (1.626 m)   Wt 148 lb (67.1 kg)   SpO2 98%   BMI 25.40 kg/m   Wt Readings from Last 3 Encounters:  02/02/22 148 lb (67.1 kg)  01/18/22 146 lb 8 oz (66.5 kg)  10/12/21 149 lb 12.8 oz (67.9 kg)     Exam: General: Pt appears well and is in NAD  Lungs: Clear with good BS bilat with no rales, rhonchi, or wheezes  Heart: RRR with normal S1 and S2 and no gallops; no murmurs; no rub  Abdomen: Normoactive bowel sounds, soft, nontender, without masses or organomegaly palpable  Extremities: No pretibial edema.   Neuro: MS is good with appropriate affect, pt is alert and Ox3     DM foot exam: 08/17/2021   The skin of the feet is intact without sores or ulcerations. The pedal pulses are 2+ on right and 2+ on left. The sensation is intact to a screening 5.07, 10 gram monofilament bilaterally    DATA REVIEWED:  Lab Results  Component Value Date   HGBA1C 6.1 (A) 02/02/2022   HGBA1C 6.3 (A) 10/12/2021   HGBA1C 7.7 (A) 08/23/2020         Glutamic Acid Decarb Ab 0.0 - 5.0 U/mL <5.0    C-Peptide 1.1 - 4.4 ng/mL 8.3 High     Islet Cell Ab Neg:<1:1 Negative      ASSESSMENT /  PLAN / RECOMMENDATIONS:   1) Type 2 Diabetes Mellitus, in remission , Without complications - Most recent A1c of 6.1  %. Goal A1c < %.    - She had been off  ALL glycemic agents for the past 3 months with an A1c of 6.1% -She is not on insulin or Januvia anymore -Encourage patient to continue with lifestyle changes   MEDICATIONS: N/A  EDUCATION / INSTRUCTIONS: BG monitoring instructions: Patient is instructed to check her blood sugars 2-3 times a week .    2) Diabetic complications:  Eye: Does not have known diabetic retinopathy. Urged to have an eye exam again today Neuro/ Feet: Does not have known diabetic peripheral neuropathy .  Renal: Patient does not have known baseline CKD.   Recommend continuation of current Tx with primary MD and consultative f/u at [facility] in the future if pt's DM control becomes problematic.   Signed electronically by: Mack Guise, MD  [facility]  [facility] [address], [place], [place] [address] Phone: [phone] FAX: [phone]   CC: [place], [place] [address] Phone: [phone]  Fax: [phone]  Return to Endocrinology clinic as below: Future Appointments  Date Time Provider [place]  03/22/2022  1:00 PM Abbie Sons, MD [facility] None  05/05/2022  9:40 AM Raulkar, Clide Deutscher, MD [facility] CPR  01/24/2023  7:45 AM Suzzanne Cloud, NP [facility] None

## 2022-02-03 ENCOUNTER — Telehealth (HOSPITAL_BASED_OUTPATIENT_CLINIC_OR_DEPARTMENT_OTHER): Payer: Self-pay | Admitting: Orthopaedic Surgery

## 2022-02-08 ENCOUNTER — Other Ambulatory Visit (HOSPITAL_BASED_OUTPATIENT_CLINIC_OR_DEPARTMENT_OTHER): Payer: Self-pay

## 2022-02-08 NOTE — Telephone Encounter (Signed)
Patient would like refill of Lidocaine 5 % 1 patch Transdermal Every 24 hours, Remove & Discard patch within 12 hours or as directed by MD sent to [facility] [address] and any future Rx for Celecoxib is sent to that same [facility] by patient request

## 2022-02-09 ENCOUNTER — Other Ambulatory Visit (HOSPITAL_BASED_OUTPATIENT_CLINIC_OR_DEPARTMENT_OTHER): Payer: Self-pay | Admitting: Orthopaedic Surgery

## 2022-02-20 ENCOUNTER — Telehealth: Payer: Self-pay | Admitting: Orthopaedic Surgery

## 2022-02-20 NOTE — Telephone Encounter (Signed)
Last OV note faxed to [phone]

## 2022-02-20 NOTE — Telephone Encounter (Signed)
Pt called requesting her information be sent to her PCP per Dr Steward Drone for Lidocaine 5%. Pt's chart states from Dr Steward Drone for pt to receive refill patch from her PCP. Pt's PCP [facility] is asking for dr notes what patches are for. Please call [facility] at [phone] for fax number to fax supportive note for meds. Pt phone number is [phone].

## 2022-02-22 ENCOUNTER — Telehealth: Payer: Self-pay | Admitting: *Deleted

## 2022-02-22 ENCOUNTER — Encounter: Payer: Self-pay | Admitting: Neurology

## 2022-02-22 NOTE — Telephone Encounter (Signed)
PA for Emgality started on covermymeds (key: [id number]). Pharmacy coverage through [facility] [phone]). Decision pending.

## 2022-02-23 NOTE — Telephone Encounter (Signed)
Received additional information request for this PA. I have completed and faxed to [facility], fax # [phone].  Confirmation received.

## 2022-02-27 NOTE — Telephone Encounter (Signed)
(  Key: [id number]) PA has been approved.   This request has received a Favorable outcome from [facility].  Please keep in mind this is not a guarantee of payment. Eligibility and Benefit determinations will be made at the time of service.  Please note any additional information provided by [facility] at the bottom of the screen.

## 2022-03-22 ENCOUNTER — Ambulatory Visit: Payer: BLUE CROSS/BLUE SHIELD | Admitting: Urology

## 2022-03-22 ENCOUNTER — Encounter: Payer: Self-pay | Admitting: Urology

## 2022-03-22 ENCOUNTER — Ambulatory Visit
Admission: RE | Admit: 2022-03-22 | Discharge: 2022-03-22 | Disposition: A | Discharge status: Home / Self Care | Payer: BLUE CROSS/BLUE SHIELD | Source: Ambulatory Visit | Attending: Urology | Admitting: Urology

## 2022-03-22 ENCOUNTER — Ambulatory Visit
Admission: RE | Admit: 2022-03-22 | Discharge: 2022-03-22 | Disposition: A | Discharge status: Home / Self Care | Payer: BLUE CROSS/BLUE SHIELD | Attending: Urology | Admitting: Urology

## 2022-03-22 VITALS — BP 124/84 | HR 76 | Ht 64.0 in | Wt 145.0 lb

## 2022-03-22 DIAGNOSIS — Z87442 Personal history of urinary calculi: Secondary | ICD-10-CM | POA: Diagnosis not present

## 2022-03-22 NOTE — Progress Notes (Signed)
 03/22/2022 1:27 PM   Sandra Moody 08/30/1970 [id number]  Referring provider: Pa, [facility] [address] [place],   [address]  Chief Complaint  Patient presents with   Nephrolithiasis    HPI: 51 y.o. female presents for 6 month follow-up visit  Refer to my prior office note 09/22/2021 for a clinical summary Since that visit she has done well and denies flank, abdominal or pelvic pain No bothersome LUTS or gross hematuria  PMH: Past Medical History:  Diagnosis Date   Acute renal disease    Asthma    Bilateral carpal tunnel syndrome 03/11/2019   Common migraine with intractable migraine 08/29/2018   Daily headache    (03/08/2017)   Essential hypertension 02/20/2019   GERD (gastroesophageal reflux disease)    H pylori ulcer ~ 08/2016   History of hiatal hernia    Leaky heart valve ~ 09/2016   Migraine    "maybe weekly" (03/08/2017)   PONV (postoperative nausea and vomiting)    Type 2 diabetes mellitus with hyperglycemia, without long-term current use of insulin ([facility]) 02/20/2019    Surgical History: Past Surgical History:  Procedure Laterality Date   CARPAL TUNNEL RELEASE Left 12/18/2019   Procedure: CARPAL TUNNEL RELEASE;  Surgeon: Kuzma, Kevin, MD;  Location: [facility];  Service: Orthopedics;  Laterality: Left;   CARPAL TUNNEL RELEASE Right 07/22/2020   Procedure: CARPAL TUNNEL RELEASE RIGHT;  Surgeon: Kuzma, Kevin, MD;  Location: [facility];  Service: Orthopedics;  Laterality: Right;   CHOLECYSTECTOMY N/A 03/08/2017   Procedure: LAPAROSCOPIC CHOLECYSTECTOMY;  Surgeon: Blackman, Douglas, MD;  [facility];  Service: General;  Laterality: N/A;   DILATION AND CURETTAGE OF UTERUS     LAPAROSCOPIC CHOLECYSTECTOMY  03/08/2017   SHOULDER ARTHROSCOPY WITH SUBACROMIAL DECOMPRESSION, ROTATOR CUFF REPAIR AND BICEP TENDON REPAIR Right 07/22/2020   Procedure: SHOULDER ARTHROSCOPY WITH SUBACROMIAL DECOMPRESSION AND DISTAL CLAVICLE  EXCISION, ROTATOR CUFF REPAIR AND BICEPS TENODESIS;  Surgeon: Varkey, Dax T, MD;  Location: [facility];  Service: Orthopedics;  Laterality: Right;   TRIGGER FINGER RELEASE Left 12/18/2019   Procedure: RELEASE TRIGGER FINGER/A-1 PULLEY;  Surgeon: Kuzma, Kevin, MD;  Location: [facility];  Service: Orthopedics;  Laterality: Left;   TUBAL LIGATION      Home Medications:  Allergies as of 03/22/2022       Reactions   Ginger Itching, Swelling   Passion Fruit Flavor [flavoring Agent] Itching, Swelling   Shellfish Allergy Anaphylaxis   Kiwi Extract Other (See Comments)   Other Other (See Comments)   ALLERGIC TO ALL TREE NUTS ALLERGIC TO ALL TREE NUTS   Tamiflu [oseltamivir Phosphate]         Medication List        Accurate as of March 22, 2022  1:27 PM. If you have any questions, ask your nurse or doctor.          STOP taking these medications    blood glucose meter kit and supplies Stopped by: Scott C Stoioff, MD   celecoxib 100 MG capsule Commonly known as: CeleBREX Stopped by: Scott C Stoioff, MD   lidocaine 5 % Commonly known as: LIDODERM Stopped by: Scott C Stoioff, MD       TAKE these medications    Accu-Chek FastClix Lancets Misc USE TO CHECK GLUCOSE TWICE A DAY ,FASTING AND 2 HOURS AFTER DINNER What changed: Another medication with the same name was removed. Continue taking this medication, and follow the directions you see   here. Changed by: Abbie Sons, MD   Accu-Chek Guide test strip Generic drug: glucose blood USE TO CHECK GLUCOSE TWICE DAILY,FASTING AND 2 HOURS AFTER DINNER   albuterol (2.5 MG/3ML) 0.083% nebulizer solution Commonly known as: PROVENTIL Take 3 mLs (2.5 mg total) by nebulization every 6 (six) hours as needed for wheezing or shortness of breath.   amLODipine 5 MG tablet Commonly known as: NORVASC 1 tablet   azelastine 0.05 % ophthalmic solution Commonly known as: OPTIVAR Place 1 drop into both eyes  daily.   BIOTIN PO Take 1 tablet by mouth daily.   Emgality 120 MG/ML Soaj Generic drug: Galcanezumab-gnlm Inject 120 mg into the skin every 30 (thirty) days.   ketoconazole 2 % shampoo Commonly known as: NIZORAL Apply 1 application topically 2 (two) times a week. Apply to affected area lather, leave in place for 5 mins and then rinse off with water   ketoconazole 2 % cream Commonly known as: NIZORAL Apply 1 application topically daily. To seborrhea/face lesion as needed.   rizatriptan 10 MG tablet Commonly known as: MAXALT Take 1 tablet (10 mg total) by mouth 3 (three) times daily as needed for migraine.        Allergies:  Allergies  Allergen Reactions   Ginger Itching and Swelling   Passion Fruit Flavor [Flavoring Agent] Itching and Swelling   Shellfish Allergy Anaphylaxis   Kiwi Extract Other (See Comments)   Other Other (See Comments)    ALLERGIC TO ALL TREE NUTS ALLERGIC TO ALL TREE NUTS   Tamiflu [Oseltamivir Phosphate]     Family History: Family History  Problem Relation Age of Onset   Thyroid disease Mother    Diabetes Maternal Grandmother    Heart disease Maternal Grandfather     Social History:  reports that she has never smoked. She has never used smokeless tobacco. She reports that she does not currently use alcohol. She reports that she does not use drugs.   Physical Exam: BP 124/84   Pulse 76   Ht 5' 4" (1.626 m)   Wt 145 lb (65.8 kg)   BMI 24.89 kg/m   Constitutional:  Alert and oriented, No acute distress. HEENT: [place] AT, moist mucus membranes.  Trachea midline, no masses. Cardiovascular: No clubbing, cyanosis, or edema. Respiratory: Normal respiratory effort, no increased work of breathing. Psychiatric: Normal mood and affect.    Assessment & Plan:    1.  Personal history urinary calculi Images of a KUB performed today were personally reviewed and interpreted.  No calcifications suspicious for urinary tract stones are identified.   There is a moderate amount of stool and bowel gas overlying the renal outlines. Follow-up 1 year with KUB and instructed to call earlier for development of flank pain or renal colic.    Abbie Sons, [facility] [address], [place], [place] [address] [phone]

## 2022-05-05 ENCOUNTER — Encounter: Payer: BLUE CROSS/BLUE SHIELD | Admitting: Physical Medicine and Rehabilitation

## 2022-08-18 ENCOUNTER — Encounter: Payer: Self-pay | Admitting: Neurology

## 2022-08-24 ENCOUNTER — Other Ambulatory Visit: Payer: Self-pay | Admitting: Neurology

## 2022-08-24 MED ORDER — AJOVY 225 MG/1.5ML ~~LOC~~ SOAJ
225.0000 mg | SUBCUTANEOUS | 11 refills | Status: DC
Start: 2022-08-24 — End: 2023-01-24

## 2022-08-24 NOTE — Telephone Encounter (Signed)
  This is the alternatives for [employer] 2024.   Which would you like to try? Emgality is not on the formulary for this year.

## 2022-08-30 ENCOUNTER — Ambulatory Visit (HOSPITAL_COMMUNITY)
Admission: EM | Admit: 2022-08-30 | Discharge: 2022-08-30 | Disposition: A | Discharge status: Home / Self Care | Payer: Commercial Managed Care - HMO | Attending: Physician Assistant | Admitting: Physician Assistant

## 2022-08-30 ENCOUNTER — Encounter (HOSPITAL_COMMUNITY): Payer: Self-pay

## 2022-08-30 DIAGNOSIS — Z8616 Personal history of COVID-19: Secondary | ICD-10-CM | POA: Diagnosis not present

## 2022-08-30 DIAGNOSIS — J029 Acute pharyngitis, unspecified: Secondary | ICD-10-CM

## 2022-08-30 DIAGNOSIS — J069 Acute upper respiratory infection, unspecified: Secondary | ICD-10-CM | POA: Diagnosis not present

## 2022-08-30 DIAGNOSIS — U071 COVID-19: Secondary | ICD-10-CM | POA: Insufficient documentation

## 2022-08-30 DIAGNOSIS — E1142 Type 2 diabetes mellitus with diabetic polyneuropathy: Secondary | ICD-10-CM | POA: Diagnosis not present

## 2022-08-30 DIAGNOSIS — R52 Pain, unspecified: Secondary | ICD-10-CM

## 2022-08-30 LAB — BASIC METABOLIC PANEL
Anion gap: 9 (ref 5–15)
BUN: <5 mg/dL — ABNORMAL LOW (ref 6–20)
CO2: 28 mmol/L (ref 22–32)
Calcium: 8.3 mg/dL — ABNORMAL LOW (ref 8.9–10.3)
Chloride: 100 mmol/L (ref 98–111)
Creatinine, Ser: 0.97 mg/dL (ref 0.44–1.00)
GFR, Estimated: >60 mL/min (ref >60)
Glucose, Bld: 77 mg/dL (ref 70–99)
Potassium: 3.4 mmol/L — ABNORMAL LOW (ref 3.5–5.1)
Sodium: 137 mmol/L (ref 135–145)

## 2022-08-30 LAB — SARS CORONAVIRUS 2 (TAT 6-24 HRS): SARS Coronavirus 2: POSITIVE — AB

## 2022-08-30 LAB — POC INFLUENZA A AND B ANTIGEN (URGENT CARE ONLY)
Influenza A Ag: NEGATIVE
Influenza B Ag: NEGATIVE

## 2022-08-30 LAB — POCT RAPID STREP A, ED / UC: Streptococcus, Group A Screen (Direct): NEGATIVE

## 2022-08-30 MED ORDER — FLUTICASONE PROPIONATE 50 MCG/ACT NA SUSP: 1.0000 | Freq: Every day | NASAL | 0 refills | Status: AC

## 2022-08-30 MED ORDER — LIDOCAINE VISCOUS HCL 2 % MT SOLN: 15.0000 mL | Freq: Four times a day (QID) | OROMUCOSAL | 0 refills | Status: DC | PRN

## 2022-08-30 NOTE — Discharge Instructions (Signed)
You tested negative for strep and flu.  We will contact you if you are positive for COVID.  Please monitor your MyChart for these results.  If you are positive for COVID you qualify for antiviral therapy such as Paxlovid or molnupiravir.  If you decide to take Paxlovid you will need to hold amlodipine while on this medication.  Continue over-the-counter medications as needed.  Use Flonase to help with your congestion.  I also recommend nasal saline/sinus rinses.  Use lidocaine to help with your sore throat; swish and spit this out.  Do not eat or drink immediately after using this medication as it increases your risk for choking.  If you have any worsening or changing symptoms you need to be seen immediately.

## 2022-08-30 NOTE — ED Triage Notes (Signed)
Pt states fever, sore throat ,stuffy nose and body aches since yesterday. Has been taking OTC cold medicine at home.

## 2022-08-30 NOTE — ED Provider Notes (Signed)
[facility]    CSN: [id number] Arrival date & time: 08/30/22  0818      History   Chief Complaint Chief Complaint  Patient presents with   Sore Throat    HPI Sandra Moody is a 52 y.o. female.   Patient presents today with a 24-hour history of URI symptoms.  Reports fever, congestion, body aches, sore throat.  Denies any significant cough, shortness of breath, chest pain, nausea, vomiting, diarrhea.  She denies any known sick contacts.  She has been taking over-the-counter medication including Alka-Seltzer cold/flu with minimal improvement of symptoms.  She has a history of asthma but has not required her albuterol inhaler since symptoms began.  She denies any recent antibiotics or steroids in the past 90 days; does report a few weeks ago she took 2 doses of doxycycline she had leftover from a previous prescription but has not had anything more recently.  She has had COVID with last episode in 2023.  She has had COVID-19 vaccines as well as influenza vaccine.  She has a history of diabetes but reports that this is diet controlled and blood sugars have been appropriate.    Past Medical History:  Diagnosis Date   Acute renal disease    Asthma    Bilateral carpal tunnel syndrome 03/11/2019   Common migraine with intractable migraine 08/29/2018   Daily headache    (03/08/2017)   Essential hypertension 02/20/2019   GERD (gastroesophageal reflux disease)    H pylori ulcer ~ 08/2016   History of hiatal hernia    Leaky heart valve ~ 09/2016   Migraine    "maybe weekly" (03/08/2017)   PONV (postoperative nausea and vomiting)    Type 2 diabetes mellitus with hyperglycemia, without long-term current use of insulin ([place]) 02/20/2019    Patient Active Problem List   Diagnosis Date Noted   Entrapment of left ulnar nerve 04/28/2020   Diabetic polyneuropathy associated with type 2 diabetes mellitus ([place]) 03/05/2020   Type 2 diabetes mellitus without complication, without long-term  current use of insulin ([place]) 09/05/2019   Trigger finger of left thumb 04/30/2019   Infection of flexor tendon sheath 04/15/2019   Carpal tunnel syndrome, bilateral 03/11/2019   Essential hypertension 02/20/2019   Type 2 diabetes mellitus with hyperglycemia, without long-term current use of insulin ([place]) 02/20/2019   Other acute sinusitis 12/25/2018   Irritable bowel syndrome 12/04/2018   Migraine with aura 12/04/2018   Nonintractable common migraine 08/29/2018   Moderate persistent asthma without complication 70/07/7492   Seasonal and perennial allergic rhinitis 06/28/2017   Mild persistent asthma with acute exacerbation 06/28/2017   Gastroesophageal reflux disease 06/28/2017   Biliary dyskinesia 03/08/2017   Asthma 03/02/2016   Asthma with acute exacerbation 10/04/2015   Other and unspecified ovarian cyst 06/17/2012    Past Surgical History:  Procedure Laterality Date   CARPAL TUNNEL RELEASE Left 12/18/2019   Procedure: CARPAL TUNNEL RELEASE;  Surgeon: [name], MD;  Location: [facility];  Service: Orthopedics;  Laterality: Left;   CARPAL TUNNEL RELEASE Right 07/22/2020   Procedure: CARPAL TUNNEL RELEASE RIGHT;  Surgeon: [name], MD;  Location: [facility];  Service: Orthopedics;  Laterality: Right;   CHOLECYSTECTOMY N/A 03/08/2017   Procedure: LAPAROSCOPIC CHOLECYSTECTOMY;  Surgeon: Coralie Keens, MD;  Location: [facility];  Service: General;  Laterality: N/A;   DILATION AND CURETTAGE OF UTERUS     LAPAROSCOPIC CHOLECYSTECTOMY  03/08/2017   SHOULDER ARTHROSCOPY WITH SUBACROMIAL DECOMPRESSION, ROTATOR CUFF REPAIR  AND BICEP TENDON REPAIR Right 07/22/2020   Procedure: SHOULDER ARTHROSCOPY WITH SUBACROMIAL DECOMPRESSION AND DISTAL CLAVICLE EXCISION, ROTATOR CUFF REPAIR AND BICEPS TENODESIS;  Surgeon: Hiram Gash, MD;  Location: [facility];  Service: Orthopedics;  Laterality: Right;   TRIGGER FINGER RELEASE Left 12/18/2019   Procedure:  RELEASE TRIGGER FINGER/A-1 PULLEY;  Surgeon: [name], MD;  Location: [facility];  Service: Orthopedics;  Laterality: Left;   TUBAL LIGATION      OB History     Gravida  5   Para  3   Term  3   Preterm  0   AB  2   Living         SAB  1   IAB  1   Ectopic  0   Multiple      Live Births               Home Medications    Prior to Admission medications   Medication Sig Start Date End Date Taking? Authorizing Provider  fluticasone (FLONASE) 50 MCG/ACT nasal spray Place 1 spray into both nostrils daily. 08/30/22  Yes Radley Teston K, PA-C  lidocaine (XYLOCAINE) 2 % solution Use as directed 15 mLs in the mouth or throat every 6 (six) hours as needed for mouth pain. Swish and spit 08/30/22  Yes Augustin Bun K, PA-C  Accu-Chek FastClix Lancets MISC USE TO CHECK GLUCOSE TWICE A DAY ,FASTING AND 2 HOURS AFTER DINNER 08/30/20   Shamleffer, Melanie Crazier, MD  ACCU-CHEK GUIDE test strip USE TO CHECK GLUCOSE TWICE DAILY,FASTING AND 2 HOURS AFTER DINNER 05/09/20   Jacelyn Pi, Lilia Argue, MD  albuterol (PROVENTIL) (2.5 MG/3ML) 0.083% nebulizer solution Take 3 mLs (2.5 mg total) by nebulization every 6 (six) hours as needed for wheezing or shortness of breath. 11/13/19   Wendie Agreste, MD  amLODipine (NORVASC) 5 MG tablet 1 tablet    [provider]  BIOTIN PO Take 1 tablet by mouth daily.    [provider]  Fremanezumab-vfrm (AJOVY) 225 MG/1.5ML SOAJ Inject 225 mg into the skin every 30 (thirty) days. 08/24/22   Suzzanne Cloud, NP  ketoconazole (NIZORAL) 2 % cream Apply 1 application topically daily. To seborrhea/face lesion as needed. 06/14/20   Wendie Agreste, MD  ketoconazole (NIZORAL) 2 % shampoo Apply 1 application topically 2 (two) times a week. Apply to affected area lather, leave in place for 5 mins and then rinse off with water 01/15/20   Jacelyn Pi, Lilia Argue, MD  rizatriptan (MAXALT) 10 MG tablet Take 1 tablet (10 mg total) by mouth  3 (three) times daily as needed for migraine. 01/18/22   Suzzanne Cloud, NP    Family History Family History  Problem Relation Age of Onset   Thyroid disease Mother    Diabetes Maternal Grandmother    Heart disease Maternal Grandfather     Social History Social History   Tobacco Use   Smoking status: Never   Smokeless tobacco: Never  Vaping Use   Vaping Use: Never used  Substance Use Topics   Alcohol use: Not Currently    Comment: 03/08/2017 "might have a drink on a holiday"   Drug use: No     Allergies   Ginger, Passion fruit flavor [flavoring agent], Shellfish allergy, Kiwi extract, Other, and Tamiflu [oseltamivir phosphate]   Review of Systems Review of Systems  Constitutional:  Positive for activity change and fever. Negative for appetite change and fatigue.  HENT:  Positive for congestion and sore throat. Negative for sinus pressure and sneezing.   Respiratory:  Negative for cough and shortness of breath.   Cardiovascular:  Negative for chest pain.  Gastrointestinal:  Negative for abdominal pain, diarrhea, nausea and vomiting.  Musculoskeletal:  Positive for arthralgias and myalgias.  Neurological:  Negative for dizziness, light-headedness and headaches.     Physical Exam Triage Vital Signs ED Triage Vitals [08/30/22 0838]  Enc Vitals Group     BP 134/80     Pulse Rate 82     Resp 16     Temp 99 F (37.2 C)     Temp Source Oral     SpO2 97 %     Weight      Height      Head Circumference      Peak Flow      Pain Score 6     Pain Loc      Pain Edu?      Excl. in [place]?    No data found.  Updated Vital Signs BP 134/80 (BP Location: Left Arm)   Pulse 82   Temp 99 F (37.2 C) (Oral)   Resp 16   LMP  (LMP Unknown)   SpO2 97%   Visual Acuity Right Eye Distance:   Left Eye Distance:   Bilateral Distance:    Right Eye Near:   Left Eye Near:    Bilateral Near:     Physical Exam Vitals reviewed.  Constitutional:      General: She is awake. She  is not in acute distress.    Appearance: Normal appearance. She is well-developed. She is not ill-appearing.     Comments: Very pleasant female appears stated age in no acute distress sitting comfortably in exam room  HENT:     Head: Normocephalic and atraumatic.     Right Ear: Tympanic membrane, ear canal and external ear normal. Tympanic membrane is not erythematous or bulging.     Left Ear: Tympanic membrane, ear canal and external ear normal. Tympanic membrane is not erythematous or bulging.     Nose:     Right Sinus: No maxillary sinus tenderness or frontal sinus tenderness.     Left Sinus: No maxillary sinus tenderness or frontal sinus tenderness.     Mouth/Throat:     Pharynx: Uvula midline. Posterior oropharyngeal erythema present. No oropharyngeal exudate.  Cardiovascular:     Rate and Rhythm: Normal rate and regular rhythm.     Heart sounds: Normal heart sounds, S1 normal and S2 normal. No murmur heard. Pulmonary:     Effort: Pulmonary effort is normal.     Breath sounds: Normal breath sounds. No wheezing, rhonchi or rales.     Comments: Clear to auscultation bilaterally Psychiatric:        Behavior: Behavior is cooperative.      UC Treatments / Results  Labs (all labs ordered are listed, but only abnormal results are displayed) Labs Reviewed  CULTURE, GROUP A STREP ([place])  SARS CORONAVIRUS 2 (TAT 6-24 HRS)  BASIC METABOLIC PANEL  POCT RAPID STREP A, ED / UC  POC INFLUENZA A AND B ANTIGEN (URGENT CARE ONLY)    EKG   Radiology No results found.  Procedures Procedures (including critical care time)  Medications Ordered in UC Medications - No data to display  Initial Impression / Assessment and Plan / UC Course  I have reviewed the triage vital signs and the nursing notes.  Pertinent labs &  imaging results that were available during my care of the patient were reviewed by me and considered in my medical decision making (see chart for details).     Patient  is well-appearing, afebrile, nontoxic, nontachycardic.  Strep testing was obtained by nursing staff and was negative.  Will send this for culture but defer antibiotics until results are available.  No evidence of acute infection on physical exam that would warrant initiation of antibiotics.  Flu testing was obtained and was negative.  COVID testing is pending.  Patient was encouraged to monitor her MyChart for these results.  Based on her history of diabetes and asthma she is a candidate for antiviral therapy.  We discussed potentially using these medications and she is open to starting Paxlovid if positive for COVID.  We do not have a recent metabolic panel on file so this was obtained during visit today in order to adjust medication dosage.  She would need to hold her amlodipine while on this medication and for 3 days after completing course.  She was given Flonase to help with her congestion and encouraged use over-the-counter medications for additional symptom relief.  Lidocaine was sent to pharmacy to help with sore throat with instruction not to eat or drink immediately after using this medication as it increases the risk of choking.  Discussed that she should rest and drink plenty of fluid.  If her symptoms or not improving within a week she is to return for reevaluation.  If she has any worsening or changing symptoms she should be seen immediately including chest pain, shortness of breath, worsening cough, weakness, nausea/vomiting interfere with oral intake.  Strict return precautions given.  Work excuse note with current CDC return to work guidelines based on COVID test result provided during visit today.  Final Clinical Impressions(s) / UC Diagnoses   Final diagnoses:  Upper respiratory tract infection, unspecified type  Sore throat  Body aches     Discharge Instructions      You tested negative for strep and flu.  We will contact you if you are positive for COVID.  Please monitor your  MyChart for these results.  If you are positive for COVID you qualify for antiviral therapy such as Paxlovid or molnupiravir.  If you decide to take Paxlovid you will need to hold amlodipine while on this medication.  Continue over-the-counter medications as needed.  Use Flonase to help with your congestion.  I also recommend nasal saline/sinus rinses.  Use lidocaine to help with your sore throat; swish and spit this out.  Do not eat or drink immediately after using this medication as it increases your risk for choking.  If you have any worsening or changing symptoms you need to be seen immediately.     ED Prescriptions     Medication Sig Dispense Auth. Provider   fluticasone (FLONASE) 50 MCG/ACT nasal spray Place 1 spray into both nostrils daily. 16 g Trystian Crisanto K, PA-C   lidocaine (XYLOCAINE) 2 % solution Use as directed 15 mLs in the mouth or throat every 6 (six) hours as needed for mouth pain. Swish and spit 100 mL Isaac Dubie K, PA-C      PDMP not reviewed this encounter.   Jeani Hawking, PA-C 08/30/22 7628

## 2022-08-31 ENCOUNTER — Other Ambulatory Visit (HOSPITAL_BASED_OUTPATIENT_CLINIC_OR_DEPARTMENT_OTHER): Payer: Self-pay

## 2022-08-31 ENCOUNTER — Telehealth (HOSPITAL_COMMUNITY): Payer: Self-pay | Admitting: Emergency Medicine

## 2022-08-31 MED ORDER — NIRMATRELVIR/RITONAVIR (PAXLOVID)TABLET
3.0000 | ORAL_TABLET | Freq: Two times a day (BID) | ORAL | 0 refills | Status: AC
  Filled 2022-08-31: qty 30, 5d supply, fill #0

## 2022-09-01 LAB — CULTURE, GROUP A STREP (THRC)

## 2022-10-15 ENCOUNTER — Other Ambulatory Visit: Payer: Self-pay

## 2022-10-15 ENCOUNTER — Encounter (HOSPITAL_COMMUNITY): Payer: Self-pay | Admitting: Obstetrics & Gynecology

## 2022-10-15 ENCOUNTER — Inpatient Hospital Stay (HOSPITAL_COMMUNITY)
Admission: AD | Admit: 2022-10-15 | Discharge: 2022-10-15 | Disposition: A | Discharge status: Home / Self Care | Payer: Commercial Managed Care - HMO | Attending: Obstetrics & Gynecology | Admitting: Obstetrics & Gynecology

## 2022-10-15 DIAGNOSIS — N939 Abnormal uterine and vaginal bleeding, unspecified: Secondary | ICD-10-CM | POA: Insufficient documentation

## 2022-10-15 DIAGNOSIS — R109 Unspecified abdominal pain: Secondary | ICD-10-CM | POA: Insufficient documentation

## 2022-10-15 LAB — POCT PREGNANCY, URINE: Preg Test, Ur: NEGATIVE

## 2022-10-15 LAB — HEMOGLOBIN AND HEMATOCRIT, BLOOD
HCT: 40.6 % (ref 36.0–46.0)
Hemoglobin: 13.4 g/dL (ref 12.0–15.0)

## 2022-10-15 MED ORDER — MEGESTROL ACETATE 40 MG PO TABS: 80.0000 mg | ORAL_TABLET | Freq: Two times a day (BID) | ORAL | 0 refills | Status: DC

## 2022-10-15 NOTE — MAU Provider Note (Signed)
History     [id number]  Arrival date and time: 10/15/22 1733    Chief Complaint  Patient presents with   Vaginal Bleeding     HPI Sandra Moody is a 52 y.o. non pregnant female who presents for vaginal bleeding.  Started bleeding Friday with vaginal spotting. Became heavier this morning. States she has gone through 4 pads today. Has had some abdominal cramping with her bleeding.  Last bleeding episode was 4 years ago. Has been on depo provera since then - last injection was in October. She saw her gyn in December & was told she was likely in menopause & could discontinue the depo.  Her gyn is on [facility] but doesn't know name of office or doctor.  Denies fever, palpitations, dizziness.     Past Medical History:  Diagnosis Date   Acute renal disease    Asthma    Bilateral carpal tunnel syndrome 03/11/2019   Daily headache    (03/08/2017)   Essential hypertension 02/20/2019   GERD (gastroesophageal reflux disease)    H pylori ulcer ~ 08/2016   History of hiatal hernia    Leaky heart valve ~ 09/2016   Migraine    "maybe weekly" (03/08/2017)   PONV (postoperative nausea and vomiting)    Type 2 diabetes mellitus with hyperglycemia, without long-term current use of insulin ([place]) 02/20/2019    Past Surgical History:  Procedure Laterality Date   CARPAL TUNNEL RELEASE Left 12/18/2019   Procedure: CARPAL TUNNEL RELEASE;  Surgeon: [name], MD;  Location: [facility];  Service: Orthopedics;  Laterality: Left;   CARPAL TUNNEL RELEASE Right 07/22/2020   Procedure: CARPAL TUNNEL RELEASE RIGHT;  Surgeon: [name], MD;  Location: [place];  Service: Orthopedics;  Laterality: Right;   CHOLECYSTECTOMY N/A 03/08/2017   Procedure: LAPAROSCOPIC CHOLECYSTECTOMY;  Surgeon: Coralie Keens, MD;  Location: [place];  Service: General;  Laterality: N/A;   DILATION AND CURETTAGE OF UTERUS     LAPAROSCOPIC CHOLECYSTECTOMY  03/08/2017   SHOULDER ARTHROSCOPY  WITH SUBACROMIAL DECOMPRESSION, ROTATOR CUFF REPAIR AND BICEP TENDON REPAIR Right 07/22/2020   Procedure: SHOULDER ARTHROSCOPY WITH SUBACROMIAL DECOMPRESSION AND DISTAL CLAVICLE EXCISION, ROTATOR CUFF REPAIR AND BICEPS TENODESIS;  Surgeon: Hiram Gash, MD;  Location: [place];  Service: Orthopedics;  Laterality: Right;   TRIGGER FINGER RELEASE Left 12/18/2019   Procedure: RELEASE TRIGGER FINGER/A-1 PULLEY;  Surgeon: [name], MD;  Location: [place];  Service: Orthopedics;  Laterality: Left;   TUBAL LIGATION      Family History  Problem Relation Age of Onset   Thyroid disease Mother    Diabetes Maternal Grandmother    Heart disease Maternal Grandfather     Social History   Socioeconomic History   Marital status: Single    Spouse name: Not on file   Number of children: 3   Years of education: Not on file   Highest education level: 12th grade  Occupational History    Comment: [employer]   Tobacco Use   Smoking status: Never   Smokeless tobacco: Never  Vaping Use   Vaping Use: Never used  Substance and Sexual Activity   Alcohol use: Not Currently    Comment: 03/08/2017 "might have a drink on a holiday"   Drug use: No   Sexual activity: Yes    Partners: Male    Birth control/protection: Injection  Other Topics Concern   Not on file  Social History Narrative   **  Merged History Encounter **   Right handed    Lives at home with significant other 2 cups daily of caffeine    Social Determinants of Health   Financial Resource Strain: Not on file  Food Insecurity: Not on file  Transportation Needs: Not on file  Physical Activity: Not on file  Stress: Not on file  Social Connections: Not on file  Intimate Partner Violence: Not on file    Allergies  Allergen Reactions   Ginger Itching and Swelling   Passion Fruit Flavor [Flavoring Agent] Itching and Swelling   Shellfish Allergy Anaphylaxis   Doxycycline Nausea And Vomiting    Kiwi Extract Other (See Comments)   Other Other (See Comments)    ALLERGIC TO ALL TREE NUTS ALLERGIC TO ALL TREE NUTS   Tamiflu [Oseltamivir Phosphate]     No current facility-administered medications on file prior to encounter.   Current Outpatient Medications on File Prior to Encounter  Medication Sig Dispense Refill   Accu-Chek FastClix Lancets MISC USE TO CHECK GLUCOSE TWICE A DAY ,FASTING AND 2 HOURS AFTER DINNER 102 each 5   ACCU-CHEK GUIDE test strip USE TO CHECK GLUCOSE TWICE DAILY,FASTING AND 2 HOURS AFTER DINNER 100 strip 5   albuterol (PROVENTIL) (2.5 MG/3ML) 0.083% nebulizer solution Take 3 mLs (2.5 mg total) by nebulization every 6 (six) hours as needed for wheezing or shortness of breath. 75 mL 1   amLODipine (NORVASC) 5 MG tablet 1 tablet     BIOTIN PO Take 1 tablet by mouth daily.     fluticasone (FLONASE) 50 MCG/ACT nasal spray Place 1 spray into both nostrils daily. 16 g 0   Fremanezumab-vfrm (AJOVY) 225 MG/1.5ML SOAJ Inject 225 mg into the skin every 30 (thirty) days. 1.68 mL 11   ketoconazole (NIZORAL) 2 % cream Apply 1 application topically daily. To seborrhea/face lesion as needed. 15 g 0   ketoconazole (NIZORAL) 2 % shampoo Apply 1 application topically 2 (two) times a week. Apply to affected area lather, leave in place for 5 mins and then rinse off with water 120 mL 5   lidocaine (XYLOCAINE) 2 % solution Use as directed 15 mLs in the mouth or throat every 6 (six) hours as needed for mouth pain. Swish and spit 100 mL 0   rizatriptan (MAXALT) 10 MG tablet Take 1 tablet (10 mg total) by mouth 3 (three) times daily as needed for migraine. 10 tablet 11     ROS Pertinent positives and negative per HPI, all others reviewed and negative  Physical Exam   BP 135/81   Pulse 65   Temp 98.5 F (36.9 C)   Resp 12   SpO2 99%   Patient Vitals for the past 24 hrs:  BP Temp Pulse Resp SpO2  10/15/22 2003 135/81 -- 65 -- --  10/15/22 1750 135/83 98.5 F (36.9 C) 71 12 99  %    Physical Exam Vitals and nursing note reviewed. Exam conducted with a chaperone present.  Constitutional:      General: She is not in acute distress.    Appearance: She is well-developed. She is not ill-appearing.  HENT:     Head: Normocephalic and atraumatic.  Eyes:     General: No scleral icterus.       Right eye: No discharge.        Left eye: No discharge.     Conjunctiva/sclera: Conjunctivae normal.  Pulmonary:     Effort: Pulmonary effort is normal. No respiratory distress.  Abdominal:  Palpations: Abdomen is soft.     Tenderness: There is no abdominal tenderness.  Genitourinary:    Comments: Pelvic: NEFG, ~30 ml dark red blood in vagina. No active bleeding from os. Cervix normal in appearance.  Bimanual: no CMT, no uterine tenderness, normal size. No adnexal masses.  Neurological:     General: No focal deficit present.     Mental Status: She is alert.  Psychiatric:        Mood and Affect: Mood normal.        Behavior: Behavior normal.       Labs Results for orders placed or performed during the hospital encounter of 10/15/22 (from the past 24 hour(s))  Pregnancy, urine POC     Status: None   Collection Time: 10/15/22  5:56 PM  Result Value Ref Range   Preg Test, Ur NEGATIVE NEGATIVE  Hemoglobin and hematocrit, blood     Status: None   Collection Time: 10/15/22  6:52 PM  Result Value Ref Range   Hemoglobin 13.4 12.0 - 15.0 g/dL   HCT 40.6 36.0 - 46.0 %    Imaging No results found.  MAU Course  Procedures Lab Orders         Hemoglobin and hematocrit, blood         Pregnancy, urine POC    Meds ordered this encounter  Medications   megestrol (MEGACE) 40 MG tablet    Sig: Take 2 tablets (80 mg total) by mouth 2 (two) times daily.    Dispense:  120 tablet    Refill:  0    Order Specific Question:   Supervising Provider    Answer:   Florian Buff [[id number]]   Imaging Orders  No imaging studies ordered today    MDM moderate  Assessment and  Plan   1. Abnormal uterine bleeding    -Vital signs stable & normal hemoglobin. Small amount of vaginal bleeding noted on exam. Patient stable. Will prescribed megace until she can follow up with her gyn. Patient plans on calling for an appointment tomorrow.      Dispo: discharged to home in stable condition.   Discharge Instructions     Discharge patient   Complete by: As directed    Discharge disposition: 01-Home or Self Care   Discharge patient date: 10/15/2022       Jorje Guild, NP 10/15/22 8:46 PM  Allergies as of 10/15/2022       Reactions   Ginger Itching, Swelling   Passion Fruit Flavor [flavoring Agent] Itching, Swelling   Shellfish Allergy Anaphylaxis   Doxycycline Nausea And Vomiting   Kiwi Extract Other (See Comments)   Other Other (See Comments)   ALLERGIC TO ALL TREE NUTS ALLERGIC TO ALL TREE NUTS   Tamiflu [oseltamivir Phosphate]         Medication List     TAKE these medications    Accu-Chek FastClix Lancets Misc USE TO CHECK GLUCOSE TWICE A DAY ,FASTING AND 2 HOURS AFTER DINNER   Accu-Chek Guide test strip Generic drug: glucose blood USE TO CHECK GLUCOSE TWICE DAILY,FASTING AND 2 HOURS AFTER DINNER   Ajovy 225 MG/1.5ML Soaj Generic drug: Fremanezumab-vfrm Inject 225 mg into the skin every 30 (thirty) days.   albuterol (2.5 MG/3ML) 0.083% nebulizer solution Commonly known as: PROVENTIL Take 3 mLs (2.5 mg total) by nebulization every 6 (six) hours as needed for wheezing or shortness of breath.   amLODipine 5 MG tablet Commonly known as: NORVASC 1 tablet   BIOTIN  PO Take 1 tablet by mouth daily.   fluticasone 50 MCG/ACT nasal spray Commonly known as: FLONASE Place 1 spray into both nostrils daily.   ketoconazole 2 % shampoo Commonly known as: NIZORAL Apply 1 application topically 2 (two) times a week. Apply to affected area lather, leave in place for 5 mins and then rinse off with water   ketoconazole 2 % cream Commonly known  as: NIZORAL Apply 1 application topically daily. To seborrhea/face lesion as needed.   lidocaine 2 % solution Commonly known as: XYLOCAINE Use as directed 15 mLs in the mouth or throat every 6 (six) hours as needed for mouth pain. Swish and spit   megestrol 40 MG tablet Commonly known as: MEGACE Take 2 tablets (80 mg total) by mouth 2 (two) times daily.   rizatriptan 10 MG tablet Commonly known as: MAXALT Take 1 tablet (10 mg total) by mouth 3 (three) times daily as needed for migraine.

## 2022-10-15 NOTE — MAU Note (Signed)
.  Sandra Moody is a 52 y.o. at Unknown here in MAU reporting: Pt reports heavy vaginal bleeding. She reports she started seeing spotting on Friday and Saturday. Pt reports she woke up and was soaked in it. Pt reports she was told at her last annual  she was going to go through menopause soon. Py reports abdominal cramping.   Onset of complaint: Friday Pain score: 7/10 lower abdomen and hips  There were no vitals filed for this visit.    Lab orders placed from triage:   pregnancy test

## 2022-11-13 ENCOUNTER — Encounter: Payer: Self-pay | Admitting: *Deleted

## 2023-01-16 ENCOUNTER — Other Ambulatory Visit (HOSPITAL_COMMUNITY): Payer: Self-pay

## 2023-01-24 ENCOUNTER — Encounter: Payer: Self-pay | Admitting: Neurology

## 2023-01-24 ENCOUNTER — Ambulatory Visit: Payer: Managed Care, Other (non HMO) | Admitting: Neurology

## 2023-01-24 VITALS — BP 125/77 | HR 100 | Ht 64.5 in | Wt 152.0 lb

## 2023-01-24 DIAGNOSIS — G43009 Migraine without aura, not intractable, without status migrainosus: Secondary | ICD-10-CM | POA: Diagnosis not present

## 2023-01-24 MED ORDER — NURTEC 75 MG PO TBDP: 75.0000 mg | ORAL_TABLET | ORAL | 11 refills | Status: AC

## 2023-01-24 MED ORDER — RIZATRIPTAN BENZOATE 10 MG PO TABS: 10.0000 mg | ORAL_TABLET | Freq: Three times a day (TID) | ORAL | 11 refills | Status: AC | PRN

## 2023-01-24 MED ORDER — QULIPTA 60 MG PO TABS: 60.0000 mg | ORAL_TABLET | Freq: Every day | ORAL | 11 refills | Status: DC

## 2023-01-24 NOTE — Progress Notes (Addendum)
PATIENT: Sandra Moody DOB: [birth year]  REASON FOR VISIT: follow up for migraine HISTORY FROM: patient PRIMARY NEUROLOGIST: Dr. Delena Bali  HISTORY OF PRESENT ILLNESS: Today 01/24/23 Last year, Sandra Moody was ordered, apparently insurance wouldn't approve. We then tried Ajovy, was denied. COVID in Feb, had uterine bleeding, just stopped, a lot of hormone treatment. Getting Depo shot now. Ear injection for several week on the right side, better now. Daily headache (feel like ache all over head), 3 migraines a week. (Retroorbital bilateral, nauseated, sensitive to sound). 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 Not missing work, pushing through. Takes Maxalt with very good benefit.  Sandra Moody previously very helpful, however would prefer to not do injection medication, no longer requiring insulin.  01/18/22 SS: Sandra Moody is here today for follow-up. Was doing well on Sandra Moody, having 3 migraines a month. Switched insurances, didn't refill Sandra Moody, has been out since end of last year. Right now having 2-4 migraines a week. Takes Maxalt, black coffee for acute headaches. Has DM, is off insulin. Last A1C was 6.30 September 2021. Seeing orthopedics for low back pain, hip pain, referred to Dr. Carlis Abbott for discussion of non-operative treatment options. Since Sandra Moody was so helpful, is agreeable to continue injections.   Update 01/18/21 SS: Sandra Moody is a 52 year old female here today to follow-up on her migraine headaches. Had COVID few weeks ago, got hurt at work in Dec, had rotator cuff surgery on the right. Isn't going back to work. Isn't improving as quickly as expected, still in PT.  Remains on Sandra Moody, on average 3 migraines a month.  Good benefit with Maxalt. Usually has tablets left each month.  Happy with current migraine control.  Update 05/20/2020 SS: Sandra Moody is a 52 year old female history of migraine headache.  Has previously been on Tofranil, but stopped after she felt it was  interfering with her diabetes.  Previously tried Topamax.  In March was placed on Sandra Moody.  Has been on now for about 3 to 4 months, has really helped her headaches, more than 80%.  In the last several months, has not had any severe migraine.  Has had a few mild headaches, has taken Maxalt twice.  She has been dealing with some orthopedic issues, had sciatica, frozen shoulder on the right, ear infection, is also on insulin for diabetes.  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  Seems to be tolerating the Sandra Moody injections well, does have some burning initially.  Is on Lyrica for foot pain.  Overall, headaches are much improved.  Presents today for evaluation unaccompanied.  Planning to have right carpal tunnel surgery with Dr. Merlyn Lot.  HISTORY 10/21/2019 SS: Sandra Moody is a 52 year old female with history of migraine headache.  Since last seen, she stopped taking Tofranil, was afraid it could be affecting her diabetes, was tired of taking multiple medications.  Since off the medicine, has had at least 2 migraines a week, with nausea.  Will take Maxalt with good benefit.  Her diabetes is now under control.  She is seeing a foot doctor, is prescribed Lyrica.  She says her headaches worsen during allergy season.  She has asthma.  She says she has previously been on Topamax.  She has not missing work, but says she has to go to work, she works as a Art therapist.  She has seen Dr. Merlyn Lot, for thumb issues, says is doing well.  She presents today for evaluation unaccompanied.    REVIEW OF SYSTEMS: Out of a  complete 14 system review of symptoms, the patient complains only of the following symptoms, and all other reviewed systems are negative.  See HPI  ALLERGIES: Allergies  Allergen Reactions   Ginger Itching and Swelling   Passion Fruit Flavor [Flavoring Agent] Itching and Swelling   Shellfish Allergy Anaphylaxis    Other Reaction(s): Not available   Tree Extract Anaphylaxis    Doxycycline Nausea And Vomiting    Other Reaction(s): Not available   Kiwi Extract Other (See Comments)   Oseltamivir     Other Reaction(s): Not available   Other Other (See Comments)    ALLERGIC TO ALL TREE NUTS ALLERGIC TO ALL TREE NUTS   Tamiflu [Oseltamivir Phosphate]     HOME MEDICATIONS: Outpatient Medications Prior to Visit  Medication Sig Dispense Refill   albuterol (PROVENTIL) (2.5 MG/3ML) 0.083% nebulizer solution Take 3 mLs (2.5 mg total) by nebulization every 6 (six) hours as needed for wheezing or shortness of breath. 75 mL 1   amLODipine (NORVASC) 5 MG tablet 1 tablet     BIOTIN PO Take 1 tablet by mouth daily.     fluticasone (FLONASE) 50 MCG/ACT nasal spray Place 1 spray into both nostrils daily. 16 g 0   ketoconazole (NIZORAL) 2 % cream Apply 1 application topically daily. To seborrhea/face lesion as needed. 15 g 0   ketoconazole (NIZORAL) 2 % shampoo Apply 1 application topically 2 (two) times a week. Apply to affected area lather, leave in place for 5 mins and then rinse off with water 120 mL 5   rizatriptan (MAXALT) 10 MG tablet Take 1 tablet (10 mg total) by mouth 3 (three) times daily as needed for migraine. 10 tablet 11   Accu-Chek FastClix Lancets MISC USE TO CHECK GLUCOSE TWICE A DAY ,FASTING AND 2 HOURS AFTER DINNER 102 each 5   ACCU-CHEK GUIDE test strip USE TO CHECK GLUCOSE TWICE DAILY,FASTING AND 2 HOURS AFTER DINNER 100 strip 5   Fremanezumab-vfrm (AJOVY) 225 MG/1.5ML SOAJ Inject 225 mg into the skin every 30 (thirty) days. 1.68 mL 11   lidocaine (XYLOCAINE) 2 % solution Use as directed 15 mLs in the mouth or throat every 6 (six) hours as needed for mouth pain. Swish and spit 100 mL 0   megestrol (MEGACE) 40 MG tablet Take 2 tablets (80 mg total) by mouth 2 (two) times daily. 120 tablet 0   No facility-administered medications prior to visit.    PAST MEDICAL HISTORY: Past Medical History:  Diagnosis Date   Acute renal disease    Asthma    Bilateral  carpal tunnel syndrome 03/11/2019   Daily headache    (03/08/2017)   Essential hypertension 02/20/2019   GERD (gastroesophageal reflux disease)    H pylori ulcer ~ 08/2016   History of hiatal hernia    Leaky heart valve ~ 09/2016   Migraine    "maybe weekly" (03/08/2017)   PONV (postoperative nausea and vomiting)    Type 2 diabetes mellitus with hyperglycemia, without long-term current use of insulin (HCC) 02/20/2019    PAST SURGICAL HISTORY: Past Surgical History:  Procedure Laterality Date   CARPAL TUNNEL RELEASE Left 12/18/2019   Procedure: CARPAL TUNNEL RELEASE;  Surgeon: Betha Loa, MD;  Location: [facility];  Service: Orthopedics;  Laterality: Left;   CARPAL TUNNEL RELEASE Right 07/22/2020   Procedure: CARPAL TUNNEL RELEASE RIGHT;  Surgeon: Betha Loa, MD;  Location: [facility];  Service: Orthopedics;  Laterality: Right;   CHOLECYSTECTOMY N/A 03/08/2017   Procedure:  LAPAROSCOPIC CHOLECYSTECTOMY;  Surgeon: Abigail Miyamoto, MD;  Location: [facility] OR;  Service: General;  Laterality: N/A;   DILATION AND CURETTAGE OF UTERUS     LAPAROSCOPIC CHOLECYSTECTOMY  03/08/2017   SHOULDER ARTHROSCOPY WITH SUBACROMIAL DECOMPRESSION, ROTATOR CUFF REPAIR AND BICEP TENDON REPAIR Right 07/22/2020   Procedure: SHOULDER ARTHROSCOPY WITH SUBACROMIAL DECOMPRESSION AND DISTAL CLAVICLE EXCISION, ROTATOR CUFF REPAIR AND BICEPS TENODESIS;  Surgeon: Bjorn Pippin, MD;  Location: [facility];  Service: Orthopedics;  Laterality: Right;   TRIGGER FINGER RELEASE Left 12/18/2019   Procedure: RELEASE TRIGGER FINGER/A-1 PULLEY;  Surgeon: Betha Loa, MD;  Location:  SURGERY CENTER;  Service: Orthopedics;  Laterality: Left;   TUBAL LIGATION      FAMILY HISTORY: Family History  Problem Relation Age of Onset   Thyroid disease Mother    Diabetes Maternal Grandmother    Heart disease Maternal Grandfather     SOCIAL HISTORY: Social History   Socioeconomic History    Marital status: Single    Spouse name: Not on file   Number of children: 3   Years of education: Not on file   Highest education level: 12th grade  Occupational History    Comment: [employer]   Tobacco Use   Smoking status: Never   Smokeless tobacco: Never  Vaping Use   Vaping Use: Never used  Substance and Sexual Activity   Alcohol use: Not Currently    Comment: 03/08/2017 "might have a drink on a holiday"   Drug use: No   Sexual activity: Not Currently    Partners: Male    Birth control/protection: Injection  Other Topics Concern   Not on file  Social History Narrative   ** Merged History Encounter **   Right handed    Lives at home with significant other 2 cups daily of caffeine    Social Determinants of Health   Financial Resource Strain: Not on file  Food Insecurity: Not on file  Transportation Needs: Not on file  Physical Activity: Not on file  Stress: Not on file  Social Connections: Not on file  Intimate Partner Violence: Not on file   PHYSICAL EXAM  Vitals:   01/24/23 0745  BP: 125/77  Pulse: 100  Weight: 152 lb (68.9 kg)  Height: 5' 4.5" (1.638 m)   Body mass index is 25.69 kg/m.  Generalized: Well developed, in no acute distress   Neurological examination  Mentation: Alert oriented to time, place, history taking. Follows all commands speech and language fluent Cranial nerve II-XII: Pupils were equal round reactive to light. Extraocular movements were full, visual field were full on confrontational test. Facial sensation and strength were normal.  Head turning and shoulder shrug  were normal and symmetric. Motor: The motor testing reveals 5 over 5 strength of all 4 extremities. Good symmetric motor tone is noted throughout.  Sensory: Sensory testing is intact to soft touch on all 4 extremities. No evidence of extinction is noted.  Coordination: Cerebellar testing reveals good finger-nose-finger and heel-to-shin bilaterally.  Gait and station:  Gait is normal.   DIAGNOSTIC DATA (LABS, IMAGING, TESTING) - I reviewed patient records, labs, notes, testing and imaging myself where available.  Lab Results  Component Value Date   WBC 9.1 08/10/2021   HGB 13.4 10/15/2022   HCT 40.6 10/15/2022   MCV 93.2 08/10/2021   PLT 341 08/10/2021      Component Value Date/Time   NA 137 08/30/2022 1033   NA 140 08/31/2020 1207   K 3.4 (  L) 08/30/2022 1033   CL 100 08/30/2022 1033   CO2 28 08/30/2022 1033   GLUCOSE 77 08/30/2022 1033   BUN <5 (L) 08/30/2022 1033   BUN 6 08/31/2020 1207   CREATININE 0.97 08/30/2022 1033   CALCIUM 8.3 (L) 08/30/2022 1033   PROT 6.4 (L) 06/15/2021 2150   PROT 6.6 03/05/2020 1108   ALBUMIN 3.4 (L) 06/15/2021 2150   ALBUMIN 4.3 03/05/2020 1108   AST 17 06/15/2021 2150   ALT 15 06/15/2021 2150   ALKPHOS 70 06/15/2021 2150   BILITOT 0.3 06/15/2021 2150   BILITOT 0.2 03/05/2020 1108   GFRNONAA >60 08/30/2022 1033   GFRAA 76 08/31/2020 1207   Lab Results  Component Value Date   CHOL 124 08/07/2019   HDL 35 (L) 08/07/2019   LDLCALC 72 08/07/2019   TRIG 86 08/07/2019   CHOLHDL 3.5 08/07/2019   Lab Results  Component Value Date   HGBA1C 6.1 (A) 02/02/2022   Lab Results  Component Value Date   VITAMINB12 724 05/23/2019   Lab Results  Component Value Date   TSH 1.590 06/01/2020   ASSESSMENT AND PLAN 52 y.o. year old female   1.  Chronic migraine headache 2.  Diabetes  -Daily headache, 3 migraines a week -We were going to order Qulipta for migraine prevention but insurance prefers Nurtec, will try Nurtec 75 mg tablet every other day for migraine prevention  -Previously tried and failed: Sandra Moody, Ajovy was denied, Tofranil, Topamax, baclofen, amlodipine -Continue Maxalt as needed for acute headache -Follow-up in 6 months or sooner if needed -Next steps:  retry Sandra Moody, Botox  Meds ordered this encounter  Medications   rizatriptan (MAXALT) 10 MG tablet    Sig: Take 1 tablet (10 mg total)  by mouth 3 (three) times daily as needed for migraine.    Dispense:  10 tablet    Refill:  11   DISCONTD: Atogepant (QULIPTA) 60 MG TABS    Sig: Take 1 tablet (60 mg total) by mouth daily.    Dispense:  30 tablet    Refill:  11   Rimegepant Sulfate (NURTEC) 75 MG TBDP    Sig: Take 1 tablet (75 mg total) by mouth every other day.    Dispense:  16 tablet    Refill:  [address], [place], [place] 01/24/2023, 8:07 AM [facility] [address], [address] [place], [place] [address] [phone]

## 2023-01-24 NOTE — Patient Instructions (Addendum)
Start Qulipta daily for migraine prevention  Avoid treatment daily headaches, only treat severe headaches, no more than 2-3 days a week to prevent rebound headache  Continue Maxalt as needed for severe headache  Follow up with me in 6 months Keep me posted! Want to get you feeling better!

## 2023-01-24 NOTE — Addendum Note (Signed)
Addended by: Glean Salvo on: 01/24/2023 10:39 AM   Modules accepted: Orders

## 2023-01-30 ENCOUNTER — Telehealth: Payer: Self-pay

## 2023-01-30 ENCOUNTER — Other Ambulatory Visit (HOSPITAL_COMMUNITY): Payer: Self-pay

## 2023-01-30 NOTE — Telephone Encounter (Signed)
Patient Advocate Encounter   Received notification from [employer] that prior authorization is required for Nurtec 75MG  dispersible tablets   Submitted: 01-30-2023 Key [id number]  Status is pending

## 2023-02-09 ENCOUNTER — Other Ambulatory Visit (HOSPITAL_COMMUNITY): Payer: Self-pay

## 2023-02-09 NOTE — Telephone Encounter (Signed)
Pharmacy Patient Advocate Encounter  Received notification from [employer] that Prior Authorization for Nurtec 75MG  dispersible tablets has been APPROVED from 02/09/2023 to 02/09/2024.Marland Kitchen  PA #/Case ID/Reference #: PA Case ID: [id number]  Copay is $0 per [facility] test claim.

## 2023-02-09 NOTE — Telephone Encounter (Signed)
Pharmacy Patient Advocate Encounter   Received notification from  [facility]  that prior authorization for Nurtec 75MG  dispersible tablets is required/requested.   Insurance verification completed.   The patient is insured through [employer] .   PA submitted to [employer] via CoverMyMeds Key/confirmation #/EOC [id number] Status is pending    The previous PA was denied due to a confusing questions being answered incorrectly-I resubmitted a new request.

## 2023-02-18 ENCOUNTER — Other Ambulatory Visit (HOSPITAL_COMMUNITY): Payer: Self-pay

## 2023-02-22 ENCOUNTER — Other Ambulatory Visit: Payer: Self-pay | Admitting: Gastroenterology

## 2023-02-22 DIAGNOSIS — R1033 Periumbilical pain: Secondary | ICD-10-CM

## 2023-02-28 ENCOUNTER — Ambulatory Visit
Admission: RE | Admit: 2023-02-28 | Discharge: 2023-02-28 | Disposition: A | Discharge status: Home / Self Care | Payer: Managed Care, Other (non HMO) | Source: Ambulatory Visit | Attending: Gastroenterology | Admitting: Gastroenterology

## 2023-02-28 DIAGNOSIS — R1033 Periumbilical pain: Secondary | ICD-10-CM

## 2023-02-28 MED ORDER — IOPAMIDOL (ISOVUE-300) INJECTION 61%
100.0000 mL | Freq: Once | INTRAVENOUS | Status: AC | PRN
  Administered 2023-02-28: 100 mL via INTRAVENOUS

## 2023-03-16 ENCOUNTER — Ambulatory Visit (HOSPITAL_BASED_OUTPATIENT_CLINIC_OR_DEPARTMENT_OTHER): Payer: Managed Care, Other (non HMO) | Admitting: Orthopaedic Surgery

## 2023-03-21 ENCOUNTER — Ambulatory Visit: Payer: BLUE CROSS/BLUE SHIELD | Admitting: Urology

## 2023-03-22 ENCOUNTER — Ambulatory Visit: Payer: BLUE CROSS/BLUE SHIELD | Admitting: Urology

## 2023-04-05 ENCOUNTER — Ambulatory Visit: Payer: BLUE CROSS/BLUE SHIELD | Admitting: Urology

## 2023-08-08 ENCOUNTER — Ambulatory Visit: Payer: Managed Care, Other (non HMO) | Admitting: Neurology

## 2024-01-28 ENCOUNTER — Other Ambulatory Visit (HOSPITAL_COMMUNITY): Payer: Self-pay
